# Patient Record
Sex: Male | Born: 1945 | Race: White | Hispanic: No | Marital: Single | Smoking: Former smoker
Health system: Southern US, Community
[De-identification: ages and names within clinical notes are randomized; demographics above are authoritative.]

## PROBLEM LIST (undated history)

## (undated) DIAGNOSIS — Z720 Tobacco use: Secondary | ICD-10-CM

## (undated) DIAGNOSIS — I4891 Unspecified atrial fibrillation: Secondary | ICD-10-CM

## (undated) DIAGNOSIS — R4182 Altered mental status, unspecified: Secondary | ICD-10-CM

## (undated) DIAGNOSIS — R7401 Elevation of levels of liver transaminase levels: Secondary | ICD-10-CM

## (undated) DIAGNOSIS — R269 Unspecified abnormalities of gait and mobility: Secondary | ICD-10-CM

## (undated) DIAGNOSIS — I5022 Chronic systolic (congestive) heart failure: Secondary | ICD-10-CM

## (undated) DIAGNOSIS — I639 Cerebral infarction, unspecified: Secondary | ICD-10-CM

## (undated) DIAGNOSIS — N179 Acute kidney failure, unspecified: Secondary | ICD-10-CM

## (undated) DIAGNOSIS — F1010 Alcohol abuse, uncomplicated: Secondary | ICD-10-CM

## (undated) DIAGNOSIS — I10 Essential (primary) hypertension: Secondary | ICD-10-CM

## (undated) DIAGNOSIS — E785 Hyperlipidemia, unspecified: Secondary | ICD-10-CM

## (undated) DIAGNOSIS — M6282 Rhabdomyolysis: Secondary | ICD-10-CM

## (undated) DIAGNOSIS — I6789 Other cerebrovascular disease: Secondary | ICD-10-CM

## (undated) DIAGNOSIS — F17200 Nicotine dependence, unspecified, uncomplicated: Secondary | ICD-10-CM

## (undated) DIAGNOSIS — R740 Nonspecific elevation of levels of transaminase and lactic acid dehydrogenase [LDH]: Secondary | ICD-10-CM

## (undated) DIAGNOSIS — R7402 Elevation of levels of lactic acid dehydrogenase (LDH): Secondary | ICD-10-CM

## (undated) HISTORY — DX: Unspecified abnormalities of gait and mobility: R26.9

## (undated) HISTORY — DX: Hyperlipidemia, unspecified: E78.5

## (undated) HISTORY — DX: Other cerebrovascular disease: I67.89

## (undated) HISTORY — DX: Alcohol abuse, uncomplicated: F10.10

## (undated) HISTORY — DX: Altered mental status, unspecified: R41.82

## (undated) HISTORY — DX: Unspecified atrial fibrillation: I48.91

## (undated) HISTORY — PX: BACK SURGERY: SHX140

## (undated) HISTORY — DX: Acute kidney failure, unspecified: N17.9

## (undated) HISTORY — DX: Elevation of levels of lactic acid dehydrogenase (LDH): R74.02

## (undated) HISTORY — PX: EYE MUSCLE SURGERY: SHX370

## (undated) HISTORY — DX: Nicotine dependence, unspecified, uncomplicated: F17.200

## (undated) HISTORY — DX: Nonspecific elevation of levels of transaminase and lactic acid dehydrogenase (ldh): R74.0

## (undated) HISTORY — DX: Rhabdomyolysis: M62.82

## (undated) HISTORY — DX: Elevation of levels of liver transaminase levels: R74.01

---

## 2011-08-23 ENCOUNTER — Emergency Department (HOSPITAL_COMMUNITY): Payer: Medicare Other

## 2011-08-23 ENCOUNTER — Encounter (HOSPITAL_COMMUNITY): Payer: Self-pay

## 2011-08-23 ENCOUNTER — Inpatient Hospital Stay (HOSPITAL_COMMUNITY): Payer: Medicare Other

## 2011-08-23 ENCOUNTER — Inpatient Hospital Stay (HOSPITAL_COMMUNITY)
Admission: EM | Admit: 2011-08-23 | Discharge: 2011-09-01 | DRG: 064 | Disposition: A | Discharge status: Skilled Nursing Facility | Payer: Medicare Other | Attending: Internal Medicine | Admitting: Internal Medicine

## 2011-08-23 DIAGNOSIS — Z880 Allergy status to penicillin: Secondary | ICD-10-CM

## 2011-08-23 DIAGNOSIS — I6340 Cerebral infarction due to embolism of unspecified cerebral artery: Secondary | ICD-10-CM

## 2011-08-23 DIAGNOSIS — M6282 Rhabdomyolysis: Secondary | ICD-10-CM | POA: Diagnosis present

## 2011-08-23 DIAGNOSIS — I5023 Acute on chronic systolic (congestive) heart failure: Secondary | ICD-10-CM | POA: Diagnosis not present

## 2011-08-23 DIAGNOSIS — I4891 Unspecified atrial fibrillation: Secondary | ICD-10-CM | POA: Diagnosis present

## 2011-08-23 DIAGNOSIS — I5021 Acute systolic (congestive) heart failure: Secondary | ICD-10-CM | POA: Diagnosis present

## 2011-08-23 DIAGNOSIS — I639 Cerebral infarction, unspecified: Secondary | ICD-10-CM | POA: Diagnosis present

## 2011-08-23 DIAGNOSIS — Z91199 Patient's noncompliance with other medical treatment and regimen due to unspecified reason: Secondary | ICD-10-CM

## 2011-08-23 DIAGNOSIS — F1020 Alcohol dependence, uncomplicated: Secondary | ICD-10-CM

## 2011-08-23 DIAGNOSIS — Z720 Tobacco use: Secondary | ICD-10-CM | POA: Diagnosis present

## 2011-08-23 DIAGNOSIS — Z888 Allergy status to other drugs, medicaments and biological substances status: Secondary | ICD-10-CM

## 2011-08-23 DIAGNOSIS — I10 Essential (primary) hypertension: Secondary | ICD-10-CM | POA: Diagnosis present

## 2011-08-23 DIAGNOSIS — G8190 Hemiplegia, unspecified affecting unspecified side: Secondary | ICD-10-CM

## 2011-08-23 DIAGNOSIS — R4182 Altered mental status, unspecified: Secondary | ICD-10-CM

## 2011-08-23 DIAGNOSIS — R945 Abnormal results of liver function studies: Secondary | ICD-10-CM | POA: Diagnosis present

## 2011-08-23 DIAGNOSIS — R29898 Other symptoms and signs involving the musculoskeletal system: Secondary | ICD-10-CM | POA: Diagnosis present

## 2011-08-23 DIAGNOSIS — F1010 Alcohol abuse, uncomplicated: Secondary | ICD-10-CM | POA: Diagnosis present

## 2011-08-23 DIAGNOSIS — Z79899 Other long term (current) drug therapy: Secondary | ICD-10-CM

## 2011-08-23 DIAGNOSIS — I509 Heart failure, unspecified: Secondary | ICD-10-CM | POA: Diagnosis present

## 2011-08-23 DIAGNOSIS — I959 Hypotension, unspecified: Secondary | ICD-10-CM | POA: Diagnosis present

## 2011-08-23 DIAGNOSIS — I426 Alcoholic cardiomyopathy: Secondary | ICD-10-CM | POA: Diagnosis present

## 2011-08-23 DIAGNOSIS — Z9119 Patient's noncompliance with other medical treatment and regimen: Secondary | ICD-10-CM

## 2011-08-23 DIAGNOSIS — F17200 Nicotine dependence, unspecified, uncomplicated: Secondary | ICD-10-CM | POA: Diagnosis present

## 2011-08-23 DIAGNOSIS — I6350 Cerebral infarction due to unspecified occlusion or stenosis of unspecified cerebral artery: Principal | ICD-10-CM | POA: Diagnosis present

## 2011-08-23 DIAGNOSIS — N179 Acute kidney failure, unspecified: Secondary | ICD-10-CM | POA: Diagnosis present

## 2011-08-23 DIAGNOSIS — R7989 Other specified abnormal findings of blood chemistry: Secondary | ICD-10-CM | POA: Diagnosis present

## 2011-08-23 HISTORY — DX: Alcohol abuse, uncomplicated: F10.10

## 2011-08-23 HISTORY — DX: Essential (primary) hypertension: I10

## 2011-08-23 HISTORY — DX: Tobacco use: Z72.0

## 2011-08-23 LAB — RAPID URINE DRUG SCREEN, HOSP PERFORMED
Amphetamines: NOT DETECTED
Barbiturates: NOT DETECTED
Benzodiazepines: NOT DETECTED
Tetrahydrocannabinol: NOT DETECTED

## 2011-08-23 LAB — DIFFERENTIAL
Eosinophils Absolute: 0.1 10*3/uL (ref 0.0–0.7)
Lymphocytes Relative: 20 % (ref 12–46)
Lymphs Abs: 2 10*3/uL (ref 0.7–4.0)
Monocytes Relative: 9 % (ref 3–12)
Neutrophils Relative %: 70 % (ref 43–77)

## 2011-08-23 LAB — CBC
Hemoglobin: 15.5 g/dL (ref 13.0–17.0)
MCH: 36 pg — ABNORMAL HIGH (ref 26.0–34.0)
MCV: 103.5 fL — ABNORMAL HIGH (ref 78.0–100.0)
RBC: 4.31 MIL/uL (ref 4.22–5.81)
WBC: 10 10*3/uL (ref 4.0–10.5)

## 2011-08-23 LAB — URINALYSIS, ROUTINE W REFLEX MICROSCOPIC
Ketones, ur: 40 mg/dL — AB
Nitrite: NEGATIVE
Protein, ur: 30 mg/dL — AB
Urobilinogen, UA: 1 mg/dL (ref 0.0–1.0)

## 2011-08-23 LAB — POCT I-STAT, CHEM 8
Hemoglobin: 16.3 g/dL (ref 13.0–17.0)
Sodium: 140 mEq/L (ref 135–145)
TCO2: 21 mmol/L (ref 0–100)

## 2011-08-23 LAB — CREATININE, URINE, RANDOM: Creatinine, Urine: 254.34 mg/dL

## 2011-08-23 LAB — COMPREHENSIVE METABOLIC PANEL
ALT: 48 U/L (ref 0–53)
Alkaline Phosphatase: 92 U/L (ref 39–117)
BUN: 39 mg/dL — ABNORMAL HIGH (ref 6–23)
CO2: 23 mEq/L (ref 19–32)
GFR calc Af Amer: 52 mL/min — ABNORMAL LOW (ref >90)
GFR calc non Af Amer: 45 mL/min — ABNORMAL LOW (ref >90)
Glucose, Bld: 91 mg/dL (ref 70–99)
Potassium: 4.5 mEq/L (ref 3.5–5.1)
Total Bilirubin: 2 mg/dL — ABNORMAL HIGH (ref 0.3–1.2)
Total Protein: 7.5 g/dL (ref 6.0–8.3)

## 2011-08-23 LAB — CARDIAC PANEL(CRET KIN+CKTOT+MB+TROPI)
Relative Index: 0.3 (ref 0.0–2.5)
Total CK: 2703 U/L — ABNORMAL HIGH (ref 7–232)

## 2011-08-23 LAB — URINE MICROSCOPIC-ADD ON

## 2011-08-23 LAB — ETHANOL: Alcohol, Ethyl (B): <11 mg/dL (ref 0–11)

## 2011-08-23 LAB — TROPONIN I: Troponin I: <0.3 ng/mL (ref <0.30)

## 2011-08-23 LAB — TSH: TSH: 1.869 u[IU]/mL (ref 0.350–4.500)

## 2011-08-23 LAB — CK TOTAL AND CKMB (NOT AT ARMC): Total CK: 3920 U/L — ABNORMAL HIGH (ref 7–232)

## 2011-08-23 LAB — SODIUM, URINE, RANDOM: Sodium, Ur: 45 mEq/L

## 2011-08-23 MED ORDER — FOLIC ACID 1 MG PO TABS
1.0000 mg | ORAL_TABLET | Freq: Every day | ORAL | Status: DC
  Administered 2011-08-23 – 2011-09-01 (×10): 1 mg via ORAL
  Filled 2011-08-23 (×11): qty 1

## 2011-08-23 MED ORDER — LORAZEPAM 2 MG/ML IJ SOLN: 0.0000 mg | Freq: Two times a day (BID) | INTRAMUSCULAR | Status: AC

## 2011-08-23 MED ORDER — ACETAMINOPHEN 650 MG RE SUPP: 650.0000 mg | RECTAL | Status: DC | PRN

## 2011-08-23 MED ORDER — ADULT MULTIVITAMIN W/MINERALS CH
1.0000 | ORAL_TABLET | Freq: Every day | ORAL | Status: DC
  Administered 2011-08-23 – 2011-09-01 (×10): 1 via ORAL
  Filled 2011-08-23 (×11): qty 1

## 2011-08-23 MED ORDER — THIAMINE HCL 100 MG/ML IJ SOLN
100.0000 mg | Freq: Every day | INTRAMUSCULAR | Status: DC
  Filled 2011-08-23 (×2): qty 1

## 2011-08-23 MED ORDER — ASPIRIN 325 MG PO TABS
325.0000 mg | ORAL_TABLET | Freq: Every day | ORAL | Status: DC
  Administered 2011-08-23 – 2011-08-25 (×3): 325 mg via ORAL
  Filled 2011-08-23 (×4): qty 1

## 2011-08-23 MED ORDER — VITAMIN B-1 100 MG PO TABS
100.0000 mg | ORAL_TABLET | Freq: Every day | ORAL | Status: DC
  Administered 2011-08-23 – 2011-09-01 (×10): 100 mg via ORAL
  Filled 2011-08-23 (×10): qty 1

## 2011-08-23 MED ORDER — LORAZEPAM 2 MG/ML IJ SOLN: 0.0000 mg | Freq: Four times a day (QID) | INTRAMUSCULAR | Status: AC

## 2011-08-23 MED ORDER — ATORVASTATIN CALCIUM 40 MG PO TABS
40.0000 mg | ORAL_TABLET | Freq: Every day | ORAL | Status: DC
  Administered 2011-08-23: 40 mg via ORAL
  Filled 2011-08-23 (×2): qty 1

## 2011-08-23 MED ORDER — SODIUM CHLORIDE 0.9 % IV SOLN
Freq: Once | INTRAVENOUS | Status: AC
  Administered 2011-08-23: 1000 mL/h via INTRAVENOUS

## 2011-08-23 MED ORDER — SODIUM CHLORIDE 0.9 % IV SOLN
INTRAVENOUS | Status: DC
  Administered 2011-08-23: 16:00:00 via INTRAVENOUS

## 2011-08-23 MED ORDER — ACETAMINOPHEN 325 MG PO TABS
650.0000 mg | ORAL_TABLET | ORAL | Status: DC | PRN
  Administered 2011-08-27: 650 mg via ORAL
  Filled 2011-08-23: qty 2
  Filled 2011-08-23: qty 1

## 2011-08-23 MED ORDER — ASPIRIN 300 MG RE SUPP
300.0000 mg | Freq: Every day | RECTAL | Status: DC
  Filled 2011-08-23 (×3): qty 1

## 2011-08-23 NOTE — ED Provider Notes (Signed)
History     CSN: [id number]  Arrival date & time 08/23/11  1133   First MD Initiated Contact with Patient 08/23/11 1141      Chief Complaint  Patient presents with  . Cerebrovascular Accident    (Consider location/radiation/quality/duration/timing/severity/associated sxs/prior treatment) Patient is a 66 y.o. male presenting with Acute Neurological Problem. The history is provided by the patient and a relative. The history is limited by the condition of the patient (The patient is a poor and unreliable historian.).  Cerebrovascular Accident This is a new problem. The current episode started in the past 7 days. The problem occurs constantly. Associated symptoms comments: Per the patient, he fell asleep on the couch 3 days ago and when he woke he was leaning to the right and had weakness in left arm. He states he does not remember anything after that. Per the friend/girl friend at bedside, the patient was at home alone this week as she was out of town. He was at baseline on Monday when she left and talked only via text message as the patient would not answer the phone but was responding by text. She returned last night to find him on the floor surrounded by urine and feces. He has been confused, but she states he is an alcoholic and she thought he had been drinking so didn't notice he was confused until she returned home. He has a history of hypertension but has not taken medication in a long time. Marland Kitchen    History reviewed. No pertinent past medical history.  History reviewed. No pertinent past surgical history.  History reviewed. No pertinent family history.  History  Substance Use Topics  . Smoking status: Current Everyday Smoker  . Smokeless tobacco: Not on file  . Alcohol Use: Yes      Review of Systems  Unable to perform ROS   Allergies  Codeine and Penicillins  Home Medications  No current outpatient prescriptions on file.  BP 149/105  Pulse 86  Temp(Src) 98.2 F (36.8  C) (Oral)  Resp 20  SpO2 99%  Physical Exam  Constitutional: He appears well-developed and well-nourished.  HENT:  Head: Normocephalic and atraumatic.  Eyes: Pupils are equal, round, and reactive to light.  Neck: Normal range of motion.  Cardiovascular: Normal rate and regular rhythm.   No murmur heard. Pulmonary/Chest: Effort normal and breath sounds normal. He has no wheezes.  Abdominal: Soft. There is no tenderness. There is no rebound and no guarding.  Musculoskeletal: Normal range of motion.  Neurological: He is alert. He displays normal reflexes. He exhibits normal muscle tone.       The patient has memory loss as to events this week. He is disoriented to place and time. He follows commands and responds verbally. No strength deficits in extremities. No cranial nerve deficits apparent, nerves 3-12. Reflexes are symmetric.     ED Course  Procedures (including critical care time)  Labs Reviewed  CBC - Abnormal; Notable for the following:    MCV 103.5 (*)    MCH 36.0 (*)    Platelets 127 (*)    All other components within normal limits  POCT I-STAT, CHEM 8 - Abnormal; Notable for the following:    BUN 38 (*)    Creatinine, Ser 1.50 (*)    Calcium, Ion 1.07 (*)    All other components within normal limits  DIFFERENTIAL  PROTIME-INR  APTT  COMPREHENSIVE METABOLIC PANEL  CK TOTAL AND CKMB  TROPONIN I  URINE RAPID DRUG  SCREEN (HOSP PERFORMED)  ETHANOL  URINALYSIS, ROUTINE W REFLEX MICROSCOPIC  AMMONIA   Results for orders placed during the hospital encounter of 08/23/11  PROTIME-INR      Component Value Range   Prothrombin Time 13.7  11.6 - 15.2 (seconds)   INR 1.03  0.00 - 1.49   APTT      Component Value Range   aPTT 32  24 - 37 (seconds)  CBC      Component Value Range   WBC 10.0  4.0 - 10.5 (K/uL)   RBC 4.31  4.22 - 5.81 (MIL/uL)   Hemoglobin 15.5  13.0 - 17.0 (g/dL)   HCT 46.9  62.9 - 52.8 (%)   MCV 103.5 (*) 78.0 - 100.0 (fL)   MCH 36.0 (*) 26.0 - 34.0  (pg)   MCHC 34.8  30.0 - 36.0 (g/dL)   RDW 41.3  24.4 - 01.0 (%)   Platelets 127 (*) 150 - 400 (K/uL)  DIFFERENTIAL      Component Value Range   Neutrophils Relative 70  43 - 77 (%)   Neutro Abs 7.0  1.7 - 7.7 (K/uL)   Lymphocytes Relative 20  12 - 46 (%)   Lymphs Abs 2.0  0.7 - 4.0 (K/uL)   Monocytes Relative 9  3 - 12 (%)   Monocytes Absolute 0.9  0.1 - 1.0 (K/uL)   Eosinophils Relative 1  0 - 5 (%)   Eosinophils Absolute 0.1  0.0 - 0.7 (K/uL)   Basophils Relative 1  0 - 1 (%)   Basophils Absolute 0.1  0.0 - 0.1 (K/uL)  COMPREHENSIVE METABOLIC PANEL      Component Value Range   Sodium 140  135 - 145 (mEq/L)   Potassium 4.5  3.5 - 5.1 (mEq/L)   Chloride 103  96 - 112 (mEq/L)   CO2 23  19 - 32 (mEq/L)   Glucose, Bld 91  70 - 99 (mg/dL)   BUN 39 (*) 6 - 23 (mg/dL)   Creatinine, Ser 2.72 (*) 0.50 - 1.35 (mg/dL)   Calcium 9.6  8.4 - 53.6 (mg/dL)   Total Protein 7.5  6.0 - 8.3 (g/dL)   Albumin 3.8  3.5 - 5.2 (g/dL)   AST 644 (*) 0 - 37 (U/L)   ALT 48  0 - 53 (U/L)   Alkaline Phosphatase 92  39 - 117 (U/L)   Total Bilirubin 2.0 (*) 0.3 - 1.2 (mg/dL)   GFR calc non Af Amer 45 (*) >90 (mL/min)   GFR calc Af Amer 52 (*) >90 (mL/min)  CK TOTAL AND CKMB      Component Value Range   Total CK 3920 (*) 7 - 232 (U/L)   CK, MB 13.1 (*) 0.3 - 4.0 (ng/mL)   Relative Index 0.3  0.0 - 2.5   TROPONIN I      Component Value Range   Troponin I <0.30  <0.30 (ng/mL)  ETHANOL      Component Value Range   Alcohol, Ethyl (B) <11  0 - 11 (mg/dL)  AMMONIA      Component Value Range   Ammonia 16  11 - 60 (umol/L)  POCT I-STAT, CHEM 8      Component Value Range   Sodium 140  135 - 145 (mEq/L)   Potassium 4.6  3.5 - 5.1 (mEq/L)   Chloride 111  96 - 112 (mEq/L)   BUN 38 (*) 6 - 23 (mg/dL)   Creatinine, Ser 0.34 (*) 0.50 - 1.35 (mg/dL)  Glucose, Bld 91  70 - 99 (mg/dL)   Calcium, Ion 7.82 (*) 1.12 - 1.32 (mmol/L)   TCO2 21  0 - 100 (mmol/L)   Hemoglobin 16.3  13.0 - 17.0 (g/dL)   HCT 95.6   21.3 - 08.6 (%)    Dg Chest Portable 1 View  08/23/2011  *RADIOLOGY REPORT*  Clinical Data: Altered mental status  PORTABLE CHEST - 1 VIEW  Comparison: None.  Findings: Cardiomediastinal silhouette is unremarkable.  No acute infiltrate or pleural effusion.  No pulmonary edema.  Mild degenerative changes thoracic spine.  IMPRESSION: No active disease.  Mild degenerative changes thoracic spine.  Original Report Authenticated By: Natasha Mead, M.D.     No diagnosis found. 1. Subacute stroke 2. Mild rhabdomyolysis 3. Alcoholism 4. Hypertension 5. Medication noncompliance 6. Altered mental status     MDM  Patient is slightly confused but stable mentally without change during ED stay. No complaint of pain. IV fluids bolusing. Subacute infarct left frontal region on CT, no active bleed. CK elevated supporting diagnosis of mild rhabdomyolysis/dehydration. Teaching Service to admit. Patient agrees to admission.         Rodena Medin, PA-C 08/23/11 1339

## 2011-08-23 NOTE — ED Notes (Signed)
Patient transported to X-ray 
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
## 2011-08-23 NOTE — Progress Notes (Signed)
08/23/11 1427  Discharge Planning  Type of Residence Private residence  Living Arrangements Spouse/significant other  Home Care Services No  Support Systems Spouse/significant other  Do you have any problems obtaining your medications? Yes (Describe)  Family/patient expects to be discharged to: Unsure  Once you are discharged, how will you get to your follow-up appointment? Friend  Expected Discharge Date 08/27/11  Case Management Consult Needed Yes (Comment)  Social Work Consult Needed Yes (Comment)      Per chart review pt is unfunded. Unit based CM/LCSW to be consulted for disposition needs and financial needs.   Dionne Milo MSW [facility] Emergency Dept. Weekend/Social Worker [phone]

## 2011-08-23 NOTE — ED Notes (Signed)
Patient transported to CT and xray 

## 2011-08-23 NOTE — ED Notes (Signed)
Pt last seen normal by friend on Monday, pt reports feeing numbness on right side for 4 days, found on the floor last night incontinent, at the time patient reported being on the floor since Tuesday

## 2011-08-23 NOTE — H&P (Signed)
[facility] Admission Note Date: 08/23/2011  Patient name: Brian Mclaughlin Medical record number: [id number] Date of birth: [birth year] Age: 66 y.o. Gender: male PCP: No primary provider on file.  Medical Service: Internal Medicine Teaching Service  Attending physician: Dr. Daiva Eves     Chief Complaint: AMS, right leg numbness/weakness  History of Present Illness: Patient is a 66 yo man with PMH of hypertension who has not seen a physician in many years. He presents to the ED with a history of right leg weakness/numbness, AMS, and bladder & bowel incontinence since Wednesday (4 days ago). The patient denies that anything like this has ever happened before. He was last seen normal by his girlfriend on Monday before she left to go out of town. She notes texting him on Tuesday and received a normal response back. Beginning sometime on Wednesday she says his texts became short or blank and did not make any sense. The patient reports watching tv all day on Wednesday and in the evening he says he fell asleep on the couch and denies "passing out." He is a bit of a bad historian for the events between Wednesday and when he was found on Friday, however he can say that he noticed his right thigh became numb and weak on Wednesday. He woke up on the floor next to the couch Wednesday night and had become incontinent of bowels and bladder at that time. He was using sheets around him to wipe himself of the incontinence. According to his girlfriend it seems as if he was contained within the living room given the radius of his incontinence as he apparently was crawling around the room. It is uncertain to both the patient and g/f where the "broom" of the house is stored, but somehow the patient was able to obtain this broom and was found Friday evening (by his girlfriend upon her arrival back home) lying flat on his back next to the couch with the broom in both hands perpendicular to his body. As he is a usual drinker  and his g/f is used to him being "drunk" she did not think much of Friday night and she helped him upstairs to bed and got him cleaned up. She said his mental status was typical of him for an evening of drinking and did not comment on his incontinence. When she awoke on Saturday morning, the day of admission, she again found signs upstairs that he had become incontinent of bowel and bladder. She found the patient downstairs on the floor next to the couch with his right arm draped on the couch as if he had attempted to get up off the floor but could not succeed. The patient was noted to be confused at this time and he was brought to the ED via ambulance. The patient denied any dizziness, N/V/D, headaches, vision changes, or hearing changes over the past week    Meds: No prescriptions prior to admission    Allergies: Allergies as of 08/23/2011 - Review Complete 08/23/2011  Allergen Reaction Noted  . Codeine Rash 08/23/2011  . Penicillins Rash 08/23/2011   Past Medical History  Diagnosis Date  . Hypertension    Past Surgical History  Procedure Date  . Eye muscle surgery     as a teenager "tighten his muscles"  . Back surgery 10 years ago    "slipped disc" repair   Family History  Problem Relation Age of Onset  . Cancer Mother   . Cancer Father   .  Multiple sclerosis Daughter    History   Social History  . Marital Status: Unknown    Spouse Name: N/A    Number of Children: 1  . Years of Education: Associate's degree from community college   Occupational History  . Unemployed Product manager    Retired   Social History Main Topics  . Smoking status: Current Everyday Smoker -- 0.5 packs/day for 45 years    Types: Cigarettes  . Smokeless tobacco: Not on file  . Alcohol Use: 6-8 beers / day x 45 years     Drinks 6-8 beers daily  . Drug Use: No     denies any drug usage  . Sexually Active: Yes -- Male partner(s)   Other Topics Concern  . Not on file   Social History  Narrative   Patient graduated HS and completed a 2 year associate's degree at [employer], unknown degree type    Review of Systems: Constitutional: positive for none, negative for chills, fatigue, fevers, malaise, night sweats and weight loss Eyes: positive for none, negative for cataracts, glaucoma, icterus and redness Ears, nose, mouth, throat, and face: positive for none, negative for earaches, facial trauma, hoarseness and sore mouth Respiratory: positive for none, negative for asthma, cough, emphysema, hemoptysis and wheezing Cardiovascular: positive for irregular heart beat, negative for chest pain, chest pressure/discomfort, dyspnea, lower extremity edema, orthopnea and paroxysmal nocturnal dyspnea Gastrointestinal: positive for change in bowel habits, negative for abdominal pain and constipation Genitourinary:positive for decreased stream, dysuria, frequency, hesitancy, nocturia and urinary incontinence, negative for hematuria Integument/breast: negative Hematologic/lymphatic: positive for none, negative for bleeding and lymphadenopathy Musculoskeletal:positive for bone pain, negative for arthralgias, back pain and myalgias Neurological: positive for coordination problems, memory problems and weakness, negative for dizziness, paresthesia and speech problems Behavioral/Psych: positive for excessive alcohol consumption and abnormal mental status, negative for fatigue, irritability and loss of interest in favorite activities Endocrine: positive for none, negative for diabetic symptoms including all and temperature intolerance Allergic/Immunologic: positive for none, negative for anaphylaxis, hay fever and urticaria  Physical Exam: Blood pressure 169/93, pulse 77, temperature 98.2 F (36.8 C), temperature source Oral, resp. rate 18, SpO2 100.00%. General appearance: cooperative, no distress and confused, laughs inappropriately, amnesia of events Head: Normocephalic,  without obvious abnormality, atraumatic Eyes: conjunctivae/corneas clear. PERRL, EOM's intact. Fundi benign. Nose: Nares normal. Septum midline. Mucosa normal. No drainage or sinus tenderness. Neck: no adenopathy, no carotid bruit, no JVD, supple, symmetrical, trachea midline and thyroid not enlarged, symmetric, no tenderness/mass/nodules Back: symmetric, no curvature. ROM normal. No CVA tenderness., large bruise right lower back (5 inches x 4 inches) Lungs: clear to auscultation bilaterally Chest wall: right sided chest wall tenderness Heart: irregularly irregular rhythm Abdomen: soft, non-tender; bowel sounds normal; no masses,  no organomegaly Extremities: extremities normal, atraumatic, no cyanosis or edema Pulses: DP decreased B/L Skin: ecchymoses - back, right lower Neurologic: Mental status: Alert, oriented, thought content appropriate, alertness: reduced, orientation: person, affect: inappropriate Cranial nerves:  I: smell Not tested  II: visual acuity  OS:     OD:   II: visual fields Fails confrontation  II: pupils Equal, round, reactive to light  III,VII: ptosis None  III,IV,VI: extraocular muscles  Full ROM  V: mastication Normal  V: facial light touch sensation  Normal  V,VII: corneal reflex  Present  VII: facial muscle function - upper  Normal  VII: facial muscle function - lower Normal  VIII: hearing Not tested  IX: soft palate elevation  Deviates RIGHT  IX,X: gag reflex Present  XI: trapezius strength  5/5  XI: sternocleidomastoid strength 5/5  XI: neck flexion strength  5/5  XII: tongue strength  Normal   Sensory: normal Motor: right leg/thigh weakness. Left foot weakness though chronic issue (since [place]) Reflexes: 2+ and symmetric  Lab results: Basic Metabolic Panel:  Basename 08/23/11 1222 08/23/11 1202  NA 140 140  K 4.6 4.5  CL 111 103  CO2 -- 23  GLUCOSE 91 91  BUN 38* 39*  CREATININE 1.50* 1.56*  CALCIUM -- 9.6  MG -- --  PHOS -- --   Liver  Function Tests:  [facility] 08/23/11 1202  AST 152*  ALT 48  ALKPHOS 92  BILITOT 2.0*  PROT 7.5  ALBUMIN 3.8   No results found for this basename: LIPASE:2,AMYLASE:2 in the last 72 hours  Basename 08/23/11 1203  AMMONIA 16   CBC:  Basename 08/23/11 1222 08/23/11 1202  WBC -- 10.0  NEUTROABS -- 7.0  HGB 16.3 15.5  HCT 48.0 44.6  MCV -- 103.5*  PLT -- 127*   Cardiac Enzymes:  Basename 08/23/11 1210  CKTOTAL 3920*  CKMB 13.1*  CKMBINDEX --  TROPONINI <0.30   BNP: No results found for this basename: PROBNP:3 in the last 72 hours D-Dimer: No results found for this basename: DDIMER:2 in the last 72 hours CBG: No results found for this basename: GLUCAP:6 in the last 72 hours Hemoglobin A1C: No results found for this basename: HGBA1C in the last 72 hours Fasting Lipid Panel: No results found for this basename: CHOL,HDL,LDLCALC,TRIG,CHOLHDL,LDLDIRECT in the last 72 hours Thyroid Function Tests: No results found for this basename: TSH,T4TOTAL,FREET4,T3FREE,THYROIDAB in the last 72 hours Anemia Panel: No results found for this basename: VITAMINB12,FOLATE,FERRITIN,TIBC,IRON,RETICCTPCT in the last 72 hours Coagulation:  Basename 08/23/11 1202  LABPROT 13.7  INR 1.03   Urine Drug Screen: Drugs of Abuse  No results found for this basename: labopia,  cocainscrnur,  labbenz,  amphetmu,  thcu,  labbarb    Alcohol Level:  Basename 08/23/11 1202  ETH <11   Urinalysis: No results found for this basename: COLORURINE:2,APPERANCEUR:2,LABSPEC:2,PHURINE:2,GLUCOSEU:2,HGBUR:2,BILIRUBINUR:2,KETONESUR:2,PROTEINUR:2,UROBILINOGEN:2,NITRITE:2,LEUKOCYTESUR:2 in the last 72 hours  Imaging results:  Ct Head Wo Contrast  08/23/2011  *RADIOLOGY REPORT*  Clinical Data: Numbness on the right side for 4 days.  The patient is confused.  CT HEAD WITHOUT CONTRAST  Technique:  Contiguous axial images were obtained from the base of the skull through the vertex without contrast.  Comparison: None.   Findings: There is diffuse low density in the periventricular and subcortical white matter suggesting chronic changes.  There is a focal low density in the medial left frontal lobe which could represent edema and an infarct.  Age of this infarct is indeterminate but it could be subacute.  Small lacunar infarcts along the right white matter tracts.  The patient has mild cerebral atrophy.  No evidence for acute hemorrhage, mass lesion, midline shift or hydrocephalus.  Motion artifact near the skull base.  No acute bony abnormality.  IMPRESSION: Low density in the medial left frontal lobe is suggestive for an infarct.  Age of the infarct is uncertain but could be subacute.  No evidence for acute hemorrhage.  Atrophy and evidence of chronic small vessel ischemic changes.  Original Report Authenticated By: Richarda Overlie, M.D.   Dg Chest Portable 1 View  08/23/2011  *RADIOLOGY REPORT*  Clinical Data: Altered mental status  PORTABLE CHEST - 1 VIEW  Comparison: None.  Findings: Cardiomediastinal silhouette is unremarkable.  No acute infiltrate or  pleural effusion.  No pulmonary edema.  Mild degenerative changes thoracic spine.  IMPRESSION: No active disease.  Mild degenerative changes thoracic spine.  Original Report Authenticated By: Natasha Mead, M.D.    Other results: EKG: there are no previous tracings available for comparison, atrial fibrillation, rate 78.  Assessment & Plan by Problem:  66 yo man with PMH of HTN, no medical care in many years. Afib noted on exam. Presents with 4 day h/o AMS, incontinent of bowel/bladder, and right thigh weakness/numbness.   Stroke - Patient has numerous risk factors for stroke (HTN, excessive etoh, afib). Last seen normal on Monday. Became confused over past 4 days and incontinent of bowel and bladder function. His affect also appears abnormal. Has never been told if he has had afib and does not see a PCP normally. CT head shows density in the medial left frontal lobe suggestive  of an infarct. Could be subacute (defined as >24 hours to 6 weeks).  Differential includes ischemic embolic event 2/2 afib (CHADS2 score =1) vs. atheroemboli vs. hypercoagulable d/o vs. paradoxical embolism. - will obtain neurology consult, appreciate their assistance in the management of this patient - MRI/MRA head pending - Labs based on risk stratification: Lipid panel, A1C, UDS - carotid dopplers, EKG, CE's, 2D echo - will begin aspirin 325 mg daily - will begin lipitor 40 mg daily - PT/OT, speech eval  Atrial fib - Unknown if new or old. Called [facility] on [address] and they do have patient in system but no information on patient as they converted to EMR 3-4 years ago and no data in computer. - MRI/MRA head  - if stroke determined to be ischemic vs. hemorrhagic, will most likely start patient on anticoagulation  Hypertension - Patient's pressures on admission 142-169 (systolic) over 93-114 (diastolic). He has a h/o of untreated HTN. - will allow increased pressure x 24 hours in setting of stroke for adequate CPP - if SBP >220 and DBP > 110 may give some PRN hydralazine  Rhabdomyolysis - His CK,tot = 3920 and CK,MB = 13.1 on admission. AG = 14. Given that he was trapped on the floor for multiple days he may have rhabdo 2/2 confinement in a fixed position in setting of stroke, or due to alcohol intoxication. Other causes can be arterial thrombus 2/2 afib, or extreme changes in body temp--hypo/hyperthermia (though much less likely). - NS @ 150 - recheck CMP daily  Acute renal failure - BUN = 39. SCr = 1.56. Ratio =25. Differential includes rhabdomyolysis induced (increased myoglobin toxic to kidney) vs. BPH (patient complains of urgency, hesitancy, frequency, dribbling) vs. crush injury (patient lying on floor on back/side for days and has large bruise on lower right back). - NS @ 150 - FeNa  Alcohol abuse - Patient reports 6-8 beers daily, though could be more as  he is a poor historian on admission. Will monitor him for s/s withdrawal. His ETOH level <11 on admit. His LFT's are abnormal with AST > (ALT x 2) - CIWA protocol - folic acid daily - thiamine daily - MV daily - consult SW  Tobacco abuse - Patient has a h/o 1/2 PPD x 45 years. In setting of stroke, HTN, and acute nature of symptoms will hold off on nicotine replacement for now. - smoking cessation consult  DVT ppx - SCD's  Disposition - Admit to IMTS, will consult neurology, will manage patient's pending and urgent medical needs.  This is a Psychologist, occupational Note.  The  care of the patient was discussed with Dr. Lorretta Harp and the assessment and plan was formulated with their assistance.  Please see their note for official documentation of the patient encounter.   SignedLewie Chamber 08/23/2011, 3:44 PM        Resident Addendum to Medical Student Admission H&P   I have seen and examined the patient, and agree with the the medical student assessment and plan as detailed in their separate H&P. Please see my brief note below for additional details.   CC: AMS and left leg weakness and numbness  HPI:  Patient is 66 yo man with PMH of HTN, alcohol and tobacco abuse, who presents with AMS and right leg numbness and weakness. History was provided by patient and his girl friend.   Per his girlfriend, patient was normal on Monday before she left to go out of town. Patient states that he was watching TV for almost a whole day on Wednesday, then he fell asleep (patient remembers that  he fell asleep rather than passed out). He did not recall how long he had slept . He woke up with the right leg weakness and numbness. He lost control of bladder and bowel movement during his sleeping. He denies any injury to his body. No nausea vomiting. His girlfriend communicated with patient on Wednesday by texting him and found his text message did not make sense. Since then patient was not able to communicate  normally with his girlfriend.  His girl friend came back on Friday and found patient was on the floor in a supine position next to the couch with a broom in his hands. According to his girlfriend it seems as if he was contained within the living room given the radius of his incontinence as he apparently was crawling around the room. As he is a usual drinker and his g/f is used to him being "drunk" she did not think much of Friday night and she helped him upstairs to bed and got him cleaned up. She said his mental status was typical of him for an evening of drinking and did not comment on his incontinence. When she awoke on Saturday morning, the day of admission, she again found signs upstairs that he had become incontinent of bowel and bladder. She found the patient downstairs on the floor next to the couch with his right arm draped on the couch as if he had attempted to get up off the floor but could not succeed. The patient was noted to be confused at this time and he was brought to the ED via ambulance.  The patient denied any dizziness, N/V/D, headaches, vision changes, or hearing changes.  Home Medications: No prescriptions prior to admission    Allergies: Allergies  Allergen Reactions  . Codeine Rash  . Penicillins Rash    Medical History: Reviewed   Surgical History: Reviewed   Social History: Reviewed   Family History: Reviewed    Review of Systems: as per HPI  Physical Examination:  Vital Signs: Blood pressure 133/90, pulse 89, temperature 97.9 F (36.6 C), temperature source Oral, resp. rate 18, SpO2 98.00%.  General: resting in bed, not in acute distress HEENT: PERRL, EOMI, no scleral icterus. No bruit and JVD. Cardiac: S1/S2, RRR, No murmurs, gallops or rubs Pulm: Good air movement bilaterally, Clear to auscultation bilaterally, No rales, wheezing, rhonchi or rubs. Abd: Soft,  nondistended, nontender, no rebound pain, no organomegaly, BS present. There is no CVA  tenderness bilaterally. Ext: No rashes  or edema, 2+DP/PT pulse bilaterally Skin: no rashes. No skin bruise. Neuro: alert and oriented X3, cranial nerves II-XII grossly intact, except for failure of conjugation of left eye and weaker palatal elevation on the left. Sensation to light touch intact. 1+ brachial reflex and very weak knee reflex bilaterally. Left ankle muscle strength is 2/5 on dorsal flexion (patient has chronic left ankle weakness for many years). Left leg muscle strength is 5/5.  Upper extremety muscle strength 5/5 bilaterally. Left leg muscle strength is 4/5. Normal finger-to-nose. Negative Babinski's sign.  Imaging results:  Ct Head Wo Contrast  08/23/2011  *RADIOLOGY REPORT*  Clinical Data: Numbness on the right side for 4 days.  The patient is confused.  CT HEAD WITHOUT CONTRAST  Technique:  Contiguous axial images were obtained from the base of the skull through the vertex without contrast.  Comparison: None.  Findings: There is diffuse low density in the periventricular and subcortical white matter suggesting chronic changes.  There is a focal low density in the medial left frontal lobe which could represent edema and an infarct.  Age of this infarct is indeterminate but it could be subacute.  Small lacunar infarcts along the right white matter tracts.  The patient has mild cerebral atrophy.  No evidence for acute hemorrhage, mass lesion, midline shift or hydrocephalus.  Motion artifact near the skull base.  No acute bony abnormality.  IMPRESSION: Low density in the medial left frontal lobe is suggestive for an infarct.  Age of the infarct is uncertain but could be subacute.  No evidence for acute hemorrhage.  Atrophy and evidence of chronic small vessel ischemic changes.  Original Report Authenticated By: Richarda Overlie, M.D.   Mr [name] Head Wo Contrast  08/23/2011  *RADIOLOGY REPORT*  Clinical Data:  Right-sided weakness.  Alcoholism.  MRI HEAD WITHOUT CONTRAST MRA HEAD WITHOUT CONTRAST   Technique:  Multiplanar, multiecho pulse sequences of the brain and surrounding structures were obtained without intravenous contrast. Angiographic images of the head were obtained using MRA technique without contrast.  Comparison:  CT head earlier today.  MRI HEAD  Findings:  Acute left frontal parasagittal, posterior frontal parasagittal, and left parietal parasagittal infarction without hemorrhage.  This corresponds to the CT abnormality.  Some of the more posterior areas of subcentimeter restricted diffusion are potentially in the left PCA territory, but the predominant area of involvement falls within the left ACA territory.  There is atrophy with chronic microvascular ischemic change. Scattered remote lacunes are noted in the basal ganglia and periventricular white matter.  Patent intracranial vasculature. Normal pituitary and cerebellar tonsils.  Mild chronic sinus disease.  There are no foci of chronic hemorrhage. Slight left mastoid fluid.  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  Atrophy and small vessel disease.  MRA HEAD  Findings: Left internal carotid artery widely patent and slightly larger than the right.  Suspected 50% stenosis cavernous right ICA. Mild nonstenotic irregularity right ICA junction with right middle cerebral artery.  A1 segment right anterior cerebral artery atretic.  Both anterior cerebral arteries fill from the left.  No MCA stenosis.  Basilar artery widely patent with vertebrals codominant.  No PCA narrowing.  The A2 and A3 segments of the right anterior cerebral artery appear widely patent.  There is focal estimated 50% narrowing of the A2 segment left anterior cerebral artery which could contribute to the patient's pattern of infarction.  This narrowing is not necessarily flow reducing.  No proximal occlusion  of the left ACA is observed. No cerebellar branch occlusion.  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  No ACA occlusion is observed, but proximal 50% stenosis  A2 segment left ACA could contribute to the observed pattern of infarction.  Original Report Authenticated By: Elsie Stain, M.D.   Mr Brain Wo Contrast  08/23/2011  *RADIOLOGY REPORT*  Clinical Data:  Right-sided weakness.  Alcoholism.  MRI HEAD WITHOUT CONTRAST MRA HEAD WITHOUT CONTRAST  Technique:  Multiplanar, multiecho pulse sequences of the brain and surrounding structures were obtained without intravenous contrast. Angiographic images of the head were obtained using MRA technique without contrast.  Comparison:  CT head earlier today.  MRI HEAD  Findings:  Acute left frontal parasagittal, posterior frontal parasagittal, and left parietal parasagittal infarction without hemorrhage.  This corresponds to the CT abnormality.  Some of the more posterior areas of subcentimeter restricted diffusion are potentially in the left PCA territory, but the predominant area of involvement falls within the left ACA territory.  There is atrophy with chronic microvascular ischemic change. Scattered remote lacunes are noted in the basal ganglia and periventricular white matter.  Patent intracranial vasculature. Normal pituitary and cerebellar tonsils.  Mild chronic sinus disease.  There are no foci of chronic hemorrhage. Slight left mastoid fluid.  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  Atrophy and small vessel disease.  MRA HEAD  Findings: Left internal carotid artery widely patent and slightly larger than the right.  Suspected 50% stenosis cavernous right ICA. Mild nonstenotic irregularity right ICA junction with right middle cerebral artery.  A1 segment right anterior cerebral artery atretic.  Both anterior cerebral arteries fill from the left.  No MCA stenosis.  Basilar artery widely patent with vertebrals codominant.   No PCA narrowing.  The A2 and A3 segments of the right anterior cerebral artery appear widely patent.  There is focal estimated 50% narrowing of the A2 segment left anterior cerebral artery which could contribute to the patient's pattern of infarction.  This narrowing is not necessarily flow reducing.  No proximal occlusion of the left ACA is observed. No cerebellar branch occlusion.  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  No ACA occlusion is observed, but proximal 50% stenosis  A2 segment left ACA could contribute to the observed pattern of infarction.  Original Report Authenticated By: Elsie Stain, M.D.   Dg Chest Portable 1 View  08/23/2011  *RADIOLOGY REPORT*  Clinical Data: Altered mental status  PORTABLE CHEST - 1 VIEW  Comparison: None.  Findings: Cardiomediastinal silhouette is unremarkable.  No acute infiltrate or pleural effusion.  No pulmonary edema.  Mild degenerative changes thoracic spine.  IMPRESSION: No active disease.  Mild degenerative changes thoracic spine.  Original Report Authenticated By: Natasha Mead, M.D.   Other results:  EKG: normal axis, flutter/A.fib, slightly early R wave progression, flattening and possible T wave inversion in inferior leads (III, aVF).    Assessment & Plan: I agree with Student's assessment and plan with additional points as follows:  # AMS:  Although patient claimed that he fell asleep on Wednesday and denies passing out, it is not completely clear about what happened in that day. Differential diagnosis includes seizure episode, syncope including vasovagal syncope, cardiac event, hypoglycemia, and neurologic event.   -will consider EEG to rule out seizure -cardiac monitor -seizure precaution  # Stroke - patient's symptoms is most likely caused by ischemic stroke given the findings on CT  scan and MRI. A. fib is an important etiology. Patient also has other risk factors including hypertension and alcohol abuse. Other  etiologies include hypercoagulable status and paradoxical embolism. Regarding patient's incontinence, it is most likely caused by stroke. But other possibility needs to be considered too, such as spinal cord lesions, Cauda Equina syndrome and seizure.  - will obtain neurology consult and follow up recommendations. - MRI/MRA head - Risk stratification: Lipid panel, A1C, UDS - carotid dopplers, EKG, CE's, 2D echo - will begin aspirin 325 mg daily - will begin lipitor 40 mg daily - PT/OT, speech eval  Atrial fib - etiology is not clear currently. The possible reasons include HTN, hyperthyroidism, COPD; ischemic heart disease, valvular disease and infection.  However it doesn't seem to be caused by cardiac ischemia given that the patient does not have any chest pain and EKG is negative for ischemia. Study showed that heparin is contraindicated in patient with A.Fib who presents with acute stroke. It is recommended to start Coumadin after 24-48 hours to decrease morbidity and mortality.  Generally, if the Atrial fibrillation happened within 48 hours, cardioversion can be performed. However, the exact onset time of his atrial Fibrilation is not clear. Patient does not have RVR currently.   - 2D-echo - repeat EKG - TSH - treat HTN after acute process is over.  Hypertension - Patient's pressures on admission 142-169 (systolic) over 93-114 (diastolic). He has a h/o of untreated HTN.  - will allow permissive HTN at this acute setting to avoid intracranial hypoperfusion. - treat only if SBP >220 and DBP > 120.  Rhabdomyolysis - His CK,tot = 3920 and CK,MB = 13.1 on admission. AG = 14. Given that he was trapped on the floor for multiple days he may have rhabdo 2/2 confinement in a fixed position in setting of stroke, or due to alcohol intoxication. Other causes can be arterial thrombus 2/2 afib, or extreme changes in body temp--hypo/hyperthermia (though much less likely).  - NS @ 150 - recheck CMP  daily - if not improve, add bicarbonate by IV   Acute renal failure - BUN = 39. SCr = 1.56. Ratio =25. Most likely caused by rhabod, other DD vs. BPH (patient complains of urgency, hesitancy, frequency, dribbling).  - NS @ 150 - FeNa -follow up BMP  Alcohol abuse - Patient reports 6-8 beers daily, though could be more as he is a poor historian on admission. Will monitor him for s/s withdrawal. His ETOH level <11 on admit. His LFT's are abnormal with AST > (ALT x 2) - CIWA protocol - folic acid daily - thiamine daily - MV daily - consult SW  Tobacco abuse - Patient has a h/o 1/2 PPD x 45 years. In setting of stroke, HTN, and acute nature of symptoms will hold off on nicotine replacement for now. - smoking cessation consult  DVT ppx - SCD's   Lorretta Harp, MD PGY1, Internal Medicine Teaching Service Pager: [phone]   08/23/2011, 8:17 PM

## 2011-08-23 NOTE — ED Provider Notes (Signed)
Medical screening examination/treatment/procedure(s) were conducted as a shared visit with non-physician practitioner(s) and myself.  I personally evaluated the patient during the encounter   Loren Racer, MD 08/23/11 [phone]

## 2011-08-23 NOTE — Progress Notes (Signed)
08/23/11 1431  OTHER  CSW Follow Up Status Follow-up required

## 2011-08-24 ENCOUNTER — Encounter (HOSPITAL_COMMUNITY): Payer: Self-pay | Admitting: Physician Assistant

## 2011-08-24 DIAGNOSIS — I6350 Cerebral infarction due to unspecified occlusion or stenosis of unspecified cerebral artery: Principal | ICD-10-CM

## 2011-08-24 DIAGNOSIS — I10 Essential (primary) hypertension: Secondary | ICD-10-CM

## 2011-08-24 DIAGNOSIS — R4182 Altered mental status, unspecified: Secondary | ICD-10-CM

## 2011-08-24 DIAGNOSIS — I6340 Cerebral infarction due to embolism of unspecified cerebral artery: Secondary | ICD-10-CM

## 2011-08-24 DIAGNOSIS — I517 Cardiomegaly: Secondary | ICD-10-CM

## 2011-08-24 DIAGNOSIS — G8190 Hemiplegia, unspecified affecting unspecified side: Secondary | ICD-10-CM

## 2011-08-24 DIAGNOSIS — I4891 Unspecified atrial fibrillation: Secondary | ICD-10-CM

## 2011-08-24 LAB — LIPID PANEL
HDL: 59 mg/dL (ref >39)
LDL Cholesterol: 75 mg/dL (ref 0–99)
Total CHOL/HDL Ratio: 2.5 RATIO
VLDL: 13 mg/dL (ref 0–40)

## 2011-08-24 LAB — BASIC METABOLIC PANEL
CO2: 24 mEq/L (ref 19–32)
Calcium: 8.7 mg/dL (ref 8.4–10.5)
Creatinine, Ser: 1.31 mg/dL (ref 0.50–1.35)
GFR calc non Af Amer: 55 mL/min — ABNORMAL LOW (ref >90)
Sodium: 140 mEq/L (ref 135–145)

## 2011-08-24 LAB — HIV ANTIBODY (ROUTINE TESTING W REFLEX): HIV: NONREACTIVE

## 2011-08-24 LAB — CARDIAC PANEL(CRET KIN+CKTOT+MB+TROPI)
CK, MB: 7.3 ng/mL (ref 0.3–4.0)
Relative Index: 0.4 (ref 0.0–2.5)
Total CK: 2039 U/L — ABNORMAL HIGH (ref 7–232)
Troponin I: <0.3 ng/mL (ref <0.30)

## 2011-08-24 LAB — HEMOGLOBIN A1C
Hgb A1c MFr Bld: 5.3 % (ref <5.7)
Mean Plasma Glucose: 105 mg/dL (ref <117)

## 2011-08-24 NOTE — H&P (Signed)
Internal Medicine Teaching Service Attending Note Date: 08/24/2011  Patient name: Brian Mclaughlin  Medical record number: [id number]  Date of birth: 1945-11-01    This patient has been seen and discussed with the house staff. Please see their note for complete details. I concur with their findings with the following additions/corrections: 66 year old alcoholic with hypertension, smoking who developed her found her right leg weakness and dysarthria this past week to the point that he was unable to move and unfortunately in had his stool in urinate where he was stuck on the floor. Apparently his girlfriend out of town and he had no one to immediately system. He apparently was able to  drag himself along the floor and at one point attempted to walk with the use of a broom stick. He is finally brought to the emergency department and has been found to have an acute left frontal parasagittal, posterior frontal parasagittal, left parietal parasagittal infarctions without hemorrhage. On telemetry overnight has been found to be in atrial fibrillation with a normal rate. Her writing further risk stratification and workup of his stroke including 2-D echocardiogram, carotid Dopplers. Are trying to aggressively manage his risk factors. One difficult area will be his atrial fibrillation. Clearly we should not anticoagulate him with heparin in this acute setting. The question is whether long-term Coumadin will be of benefit in this patient he was a heavy drinker.  Dr. Meredith Pel back in the morning.   Paulette Blanch Dam 08/24/2011, 1:15 PM

## 2011-08-24 NOTE — Consult Note (Addendum)
CARDIOLOGY CONSULT NOTE  Patient ID: Brian Mclaughlin, MRN: [id number], DOB/AGE: 1945/10/09 66 y.o. Admit date: 08/23/2011   Date of Consult: 08/24/2011 Primary Physician: has not been seeing one Primary Cardiologist: New  Chief Complaint: incontinence, altered mental status Reason for Consult: afib in setting of stroke  HPI: 66 y/o M with hx of HTN, tobacco/EtOH use presented to [facility] with complaints of altered mental status. He apparently was texting his girlfriend on Wednesday with messages that did not make sense or were blank. Sometime between Wednesday and Friday be gegan to develop numbness and weakness of his R thigh. On Friday he was found in his living room incontinent of stools holding a broom. Because he is a heavy drinker, his girlfriend did not think much of the events on Friday night and helped him clean up and get to bed. On Saturday morning he again had bowel incontinence and R arm weakness with confusion so he was brought to the hospital. By MRI he was found to have acute left frontal parasagittal, posterior frontal parasagittal, left parietal parasagittal infarctions without hemorrhage. He was also found to have rhabdomyolysis, acute renal insufficiency this admission. EKG has demonstrated atrial fibrillation with controlled ventricular response, which is a new finding for him. Brian Mclaughlin denies any knowledge of cardiac symptoms or previous cardiac workup - he denies CP, SOB, palpitations, syncope, nausea, vomiting. He currently feels well without complaint. He is somewhat of a poor historian with very abbreviated answers but answers questions appropriately.  Past Medical History  Diagnosis Date  . Hypertension   . Tobacco abuse   . ETOH abuse       Most Recent Cardiac Studies: None - echo is pending   Surgical History:  Past Surgical History  Procedure Date  . Eye muscle surgery     as a teenager "tighten his muscles"  . Back surgery 10 years ago    "slipped disc"  repair     Home Meds: None Prior to Admission medications   Not on File    Inpatient Medications:    . aspirin  300 mg Rectal Daily   Or  . aspirin  325 mg Oral Daily  . folic acid  1 mg Oral Daily  . LORazepam  0-4 mg Intravenous Q6H   Followed by  . LORazepam  0-4 mg Intravenous Q12H  . multivitamin with minerals  1 tablet Oral Daily  . thiamine  100 mg Oral Daily  . DISCONTD: atorvastatin  40 mg Oral q1800  . DISCONTD: thiamine  100 mg Intravenous Daily    Allergies:  Allergies  Allergen Reactions  . Codeine Rash  . Penicillins Rash    History   Social History  . Marital Status: Unknown    Spouse Name: N/A    Number of Children: 1  . Years of Education: N/A   Occupational History  . Unemployed Product manager    Retired   Social History Main Topics  . Smoking status: Current Everyday Smoker -- 0.5 packs/day for 45 years    Types: Cigarettes  . Smokeless tobacco: Not on file  . Alcohol Use: 0.0 oz/week     Drinks 6-8 beers daily  . Drug Use: No     denies any drug usage  . Sexually Active: Yes -- Male partner(s)   Other Topics Concern  . Not on file   Social History Narrative   Patient graduated HS and completed a 2 year associate's degree at [employer], unknown  degree type     Family History  Problem Relation Age of Onset  . Cancer Mother   . Cancer Father   . Multiple sclerosis Daughter      Review of Systems: General: negative for chills, fever, night sweats or weight changes.  Cardiovascular: see above Dermatological: negative for rash. He is quite tan but sits out on the back deck of his house often Respiratory: negative for cough or wheezing Urologic: negative for hematuria Abdominal: negative for nausea, vomiting, diarrhea, bright red blood per rectum, melena, or hematemesis Neurologic: see above All other systems reviewed and are otherwise negative except as noted above.  Labs:  [facility] 08/24/11 0540  09/02/11 1932 2011/09/02 1210  CKTOTAL 2039* 2703* 3920*  CKMB 7.3* 9.1* 13.1*  TROPONINI <0.30 <0.30 <0.30   Lab Results  Component Value Date   WBC 10.0 09-02-2011   HGB 16.3 02-Sep-2011   HCT 48.0 Sep 02, 2011   MCV 103.5* 09-02-11   PLT 127* 09-02-11    Lab 08/24/11 0540 09-02-11 1202  NA 140 --  K 3.6 --  CL 107 --  CO2 24 --  BUN 32* --  CREATININE 1.31 --  CALCIUM 8.7 --  PROT -- 7.5  BILITOT -- 2.0*  ALKPHOS -- 92  ALT -- 48  AST -- 152*  GLUCOSE 92 --   Lab Results  Component Value Date   CHOL 147 08/24/2011   HDL 59 08/24/2011   LDLCALC 75 08/24/2011   TRIG 64 08/24/2011   Radiology/Studies:  1. CT Head Wo Contrast 09/02/2011  *RADIOLOGY REPORT*  Clinical Data: Numbness on the right side for 4 days.  The patient is confused.  CT HEAD WITHOUT CONTRAST  Technique:  Contiguous axial images were obtained from the base of the skull through the vertex without contrast.  Comparison: None.  Findings: There is diffuse low density in the periventricular and subcortical white matter suggesting chronic changes.  There is a focal low density in the medial left frontal lobe which could represent edema and an infarct.  Age of this infarct is indeterminate but it could be subacute.  Small lacunar infarcts along the right white matter tracts.  The patient has mild cerebral atrophy.  No evidence for acute hemorrhage, mass lesion, midline shift or hydrocephalus.  Motion artifact near the skull base.  No acute bony abnormality.  IMPRESSION: Low density in the medial left frontal lobe is suggestive for an infarct.  Age of the infarct is uncertain but could be subacute.  No evidence for acute hemorrhage.  Atrophy and evidence of chronic small vessel ischemic changes.  Original Report Authenticated By: Richarda Overlie, M.D.   2. Brian Mclaughlin Head Wo Contrast 09-02-2011  *RADIOLOGY REPORT*  Clinical Data:  Right-sided weakness.  Alcoholism.  MRI HEAD WITHOUT CONTRAST MRA HEAD WITHOUT CONTRAST  Technique:  Multiplanar,  multiecho pulse sequences of the brain and surrounding structures were obtained without intravenous contrast. Angiographic images of the head were obtained using MRA technique without contrast.  Comparison:  CT head earlier today.  MRI HEAD  Findings:  Acute left frontal parasagittal, posterior frontal parasagittal, and left parietal parasagittal infarction without hemorrhage.  This corresponds to the CT abnormality.  Some of the more posterior areas of subcentimeter restricted diffusion are potentially in the left PCA territory, but the predominant area of involvement falls within the left ACA territory.  There is atrophy with chronic microvascular ischemic change. Scattered remote lacunes are noted in the basal ganglia and periventricular white matter.  Patent  intracranial vasculature. Normal pituitary and cerebellar tonsils.  Mild chronic sinus disease.  There are no foci of chronic hemorrhage. Slight left mastoid fluid.  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  Atrophy and small vessel disease.  MRA HEAD  Findings: Left internal carotid artery widely patent and slightly larger than the right.  Suspected 50% stenosis cavernous right ICA. Mild nonstenotic irregularity right ICA junction with right middle cerebral artery.  A1 segment right anterior cerebral artery atretic.  Both anterior cerebral arteries fill from the left.  No MCA stenosis.  Basilar artery widely patent with vertebrals codominant.  No PCA narrowing.  The A2 and A3 segments of the right anterior cerebral artery appear widely patent.  There is focal estimated 50% narrowing of the A2 segment left anterior cerebral artery which could contribute to the patient's pattern of infarction.  This narrowing is not necessarily flow reducing.  No proximal occlusion of the left ACA is observed. No cerebellar branch occlusion.  IMPRESSION: Suspected 50% stenosis right  ICA cavernous segment.  No ACA occlusion is observed, but proximal 50% stenosis  A2 segment left ACA could contribute to the observed pattern of infarction.  Original Report Authenticated By: Elsie Stain, M.D.   3. Brian Brain Wo Contrast 08/23/2011  *RADIOLOGY REPORT*  Clinical Data:  Right-sided weakness.  Alcoholism.  MRI HEAD WITHOUT CONTRAST MRA HEAD WITHOUT CONTRAST  Technique:  Multiplanar, multiecho pulse sequences of the brain and surrounding structures were obtained without intravenous contrast. Angiographic images of the head were obtained using MRA technique without contrast.  Comparison:  CT head earlier today.  MRI HEAD  Findings:  Acute left frontal parasagittal, posterior frontal parasagittal, and left parietal parasagittal infarction without hemorrhage.  This corresponds to the CT abnormality.  Some of the more posterior areas of subcentimeter restricted diffusion are potentially in the left PCA territory, but the predominant area of involvement falls within the left ACA territory.  There is atrophy with chronic microvascular ischemic change. Scattered remote lacunes are noted in the basal ganglia and periventricular white matter.  Patent intracranial vasculature. Normal pituitary and cerebellar tonsils.  Mild chronic sinus disease.  There are no foci of chronic hemorrhage. Slight left mastoid fluid.  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  Atrophy and small vessel disease.  MRA HEAD  Findings: Left internal carotid artery widely patent and slightly larger than the right.  Suspected 50% stenosis cavernous right ICA. Mild nonstenotic irregularity right ICA junction with right middle cerebral artery.  A1 segment right anterior cerebral artery atretic.  Both anterior cerebral arteries fill from the left.  No MCA stenosis.  Basilar artery widely patent with vertebrals codominant.  No PCA narrowing.  The A2  and A3 segments of the right anterior cerebral artery appear widely patent.  There is focal estimated 50% narrowing of the A2 segment left anterior cerebral artery which could contribute to the patient's pattern of infarction.  This narrowing is not necessarily flow reducing.  No proximal occlusion of the left ACA is observed. No cerebellar branch occlusion.  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  No ACA occlusion is observed, but proximal 50% stenosis  A2 segment left ACA could contribute to the observed pattern of infarction.  Original Report Authenticated By: Elsie Stain, M.D.   4. Chest Portable 1 View 08/23/2011  *RADIOLOGY REPORT*  Clinical Data: Altered mental status  PORTABLE CHEST - 1  VIEW  Comparison: None.  Findings: Cardiomediastinal silhouette is unremarkable.  No acute infiltrate or pleural effusion.  No pulmonary edema.  Mild degenerative changes thoracic spine.  IMPRESSION: No active disease.  Mild degenerative changes thoracic spine.  Original Report Authenticated By: Natasha Mead, M.D.    EKG: atrial fibrillation borderline IVCD non-specific ST-T changes  Physical Exam: Blood pressure 130/88, pulse 84, temperature 97.7 F (36.5 C), temperature source Oral, resp. rate 18, height 6' (1.829 m), weight 224 lb 4.8 oz (101.742 kg), SpO2 98.00%. General: Well developed WM in no acute distress. Head: Normocephalic, atraumatic, sclera non-icteric, no xanthomas, nares are without discharge. Pupils are both 2mm. Neck: Negative for carotid bruits. JVD not elevated. Lungs: Clear bilaterally to auscultation without wheezes, rales, or rhonchi. Breathing is unlabored. Heart: Irregularly irregular with S1 S2. No murmurs, rubs, or gallops appreciated. Abdomen: Soft, non-tender, non-distended with normoactive bowel sounds. No hepatomegaly. No rebound/guarding. No obvious abdominal masses. Msk:  Strength and tone appear normal for age. Extremities: No clubbing or cyanosis. No  edema.  Distal pedal pulses are 2+ and equal bilaterally. Neuro: Alert and oriented X 3. Moves all extremities spontaneously. Psych:  Responds to questions with a flat affect and somewhat abbreviated answers but does answer appropriately.   Assessment and Plan:   1. Acute CVA 2. Newly recognized atrial fibrillation of unclear duration, currently rate controlled 3. EtOH abuse  4. Frequent falls at home 5. Rhabdomyolysis 6. HTN 7. Tobacco abuse  The patient is currently asymptomatic from his atrial fibrillation which is of unclear duration. He is rate controlled without any agents at present. Would proceed with 2D echo which is pending. From an anticoagulation standpoint, although CHADS2 score is at least 3 he is not currently an ideal coumadin candidate due to hx of EtOH abuse and frequent falls. This can be readdressed as an outpatient if his mobility improves and if he demonstrates compliance. Will need to see how he does with PT. His blood pressure is currently controlled but diastolics tend to run high; consider initiation of low-dose BB/CCB once we are no longer running them higher in setting of acute stroke. We also raise the question of the utility of checking a ceruloplasmin level, given somewhat abnormal LFTs as well as recent "clumsiness" even when not intoxicated.   Signed, Dayna Dunn PA-C 08/24/2011, 4:11 PM  I have seen, examined the patient, and reviewed the above assessment and plan.  The patient has a h/o ongoing heavy ETOH.  He has been quite unsteady and has had multiple falls.  He presents with asymptomatic afib (likely a result of ETOH) which is presently rate controlled.  He has had a stroke, likely embolic from afib.  We will obtain an echo to evaluate for structural heart disease. His annual risk for recurrent stroke is at least 3 percent (likely higher).  He therefore should be anticoagulated with either coumadin or a novel anticoagulant long term.  Unfortunately, his heavy  ETOH, poor insight, and recent falls (particularly in the setting of ETOH) increase his risks with anticoagulation.  His HASBLED score is 3 (stroke, ETOH, and abnormal LFTS) suggesting increased bleeding risks long term.  I had a long and frank conversation with the patient today.  I have advised that if he is willing to quit ETOH and follow closely in our office that he be anticoagulated with coumadin.  Unfortunately, he does not think that he will reliably quit ETOH.  He does not think that he would be an appropriate coumadin  candidate.  I have asked the patient to further contemplate his decision. Given limited insight and concerns for noncompliance, he may not be an appropriate candidate for anticoagulation at this time.  If his echo is normal, then we will see him as needed. Please call with questions.  Co Sign: Hillis Range, MD 08/24/2011 8:48 PM

## 2011-08-24 NOTE — Progress Notes (Signed)
Pt's B/P has been somewhat high through the night. Paged teaching services and spoke with MD on call about it. No new orders were given, as this is stroke pt and we are not trying to lower his B/P at this time. However we will continue to monitor pt closely.
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
## 2011-08-24 NOTE — Progress Notes (Signed)
History: Brian Mclaughlin is an 66 y.o. male who was found by his girlfriend on 08/22/11 in the floor holding a broom in his own stool and urine. The girlfriend helped him to bed but found him in his stool and urine again today. Patient was felt to have right sided weakness. He has been numb on the right for 4 days. Patient is amnestic of these events. After being found again today the patient was brought in for evaluation. He feels that he has improved somewhat but continues to have right sided complaints.   LSN: 08/18/11  tPA Given: No: Outside time window  Subjective: I'm ok.  "Friend" at bedside says he's back to normal.  Objective: BP 133/84  Pulse 75  Temp(Src) 97.8 F (36.6 C) (Oral)  Resp 19  SpO2 99% Telemetry:AF  CBGs No results found for this basename: GLUCAP:10 in the last 72 hours  Diet: regular, thin Activity: up with assistance DVT Prophylaxis: SCDs  Medications: Scheduled:   . sodium chloride   Intravenous Once  . aspirin  300 mg Rectal Daily   Or  . aspirin  325 mg Oral Daily  . folic acid  1 mg Oral Daily  . LORazepam  0-4 mg Intravenous Q6H   Followed by  . LORazepam  0-4 mg Intravenous Q12H  . multivitamin with minerals  1 tablet Oral Daily  . thiamine  100 mg Oral Daily   Or  . thiamine  100 mg Intravenous Daily  . DISCONTD: atorvastatin  40 mg Oral q1800   Neurologic Exam: Mental Status: Alert, oriented, thought content appropriate.  Speech fluent without evidence of aphasia. Able to follow 3 step commands without difficulty. Flat affect.  Does not initiate conversation, but answers readily. Cranial Nerves: II- Visual fields grossly intact. III/IV/VI-Extraocular movements intact.  Slight disconjugate gaze; OD with slight extropic deviation, exacerbated with upward gaze; (chronic per patient's friend) V/VII-Smile symmetric VIII-hearing grossly intact XI-bilateral shoulder shrug XII-midline tongue extension Motor: 5-/5 RUE 5/5 LUE, slightly weaker grip on  right.  5-/5 RLE at hip flexor and extension, 5/5 LLE .  normal tone and bulk,  + Drift RUE Sensory: light touch decreased in the lower extremities Deep Tendon Reflexes: 1+ in the upper extremities and absent in the lower extremities Plantars: equivocal Cerebellar: Normal finger-to-nose, normal rapid alternating movements and normal heel-to-shin test.   Back: grapefruit sized dark purple area mid right bask with small superficial excoriation.  Lab Results: Basic Metabolic Panel:  Lab 08/24/11 4098 08/23/11 1222 08/23/11 1202  NA 140 140 --  K 3.6 4.6 --  CL 107 111 --  CO2 24 -- 23  GLUCOSE 92 91 --  BUN 32* 38* --  CREATININE 1.31 1.50* --  CALCIUM 8.7 -- 9.6  MG -- -- --  PHOS -- -- --   Liver Function Tests:  Lab 08/23/11 1202  AST 152*  ALT 48  ALKPHOS 92  BILITOT 2.0*  PROT 7.5  ALBUMIN 3.8    Lab 08/23/11 1203  AMMONIA 16   CBC:  Lab 08/23/11 1222 08/23/11 1202  WBC -- 10.0  NEUTROABS -- 7.0  HGB 16.3 15.5  HCT 48.0 44.6  MCV -- 103.5*  PLT -- 127*   Cardiac Enzymes:  Lab 08/24/11 0540 08/23/11 1932 08/23/11 1210  CKTOTAL 2039* 2703* 3920*  CKMB 7.3* 9.1* 13.1*  CKMBINDEX -- -- --  TROPONINI <0.30 <0.30 <0.30   Hemoglobin A1C:  Lab 08/23/11 1542  HGBA1C 5.3   Fasting Lipid Panel:  Lab  08/24/11 0540  CHOL 147  HDL 59  LDLCALC 75  TRIG 64  CHOLHDL 2.5  LDLDIRECT --   Thyroid Function Tests:  Lab 08/23/11 1542  TSH 1.869  T4TOTAL --  FREET4 --  T3FREE --  THYROIDAB --   Coagulation:  Lab 08/23/11 1202  LABPROT 13.7  INR 1.03   Anemia Panel:  Lab 08/23/11 1542  VITAMINB12 479  FOLATE --  FERRITIN --  TIBC --  IRON --  RETICCTPCT --   Urine Drug Screen: Drugs of Abuse     Component Value Date/Time   LABOPIA NONE DETECTED 08/23/2011 1620   COCAINSCRNUR NONE DETECTED 08/23/2011 1620   LABBENZ NONE DETECTED 08/23/2011 1620   AMPHETMU NONE DETECTED 08/23/2011 1620   THCU NONE DETECTED 08/23/2011 1620   LABBARB NONE DETECTED  08/23/2011 1620    Alcohol Level:  Lab 08/23/11 1202  ETH <11   Urinalysis:  Lab 08/23/11 1620  COLORURINE AMBER*  LABSPEC 1.033*  PHURINE 5.5  GLUCOSEU NEGATIVE  HGBUR TRACE*  BILIRUBINUR MODERATE*  KETONESUR 40*  PROTEINUR 30*  UROBILINOGEN 1.0  NITRITE NEGATIVE  LEUKOCYTESUR SMALL*   Study Results  08/23/2011  CT HEAD WITHOUT CONTRAST Findings: There is diffuse low density in the periventricular and subcortical white matter suggesting chronic changes.  There is a focal low density in the medial left frontal lobe which could represent edema and an infarct.  Age of this infarct is indeterminate but it could be subacute.  Small lacunar infarcts along the right white matter tracts.  The patient has mild cerebral atrophy.  No evidence for acute hemorrhage, mass lesion, midline shift or hydrocephalus.  Motion artifact near the skull base.  No acute bony abnormality.   IMPRESSION: Low density in the medial left frontal lobe is suggestive for an infarct.  Age of the infarct is uncertain but could be subacute.  No evidence for acute hemorrhage.  Atrophy and evidence of chronic small vessel ischemic changes.ADAM HENN, M.D.    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  This corresponds to the CT abnormality.  Some of the more posterior areas of subcentimeter restricted diffusion are potentially in the left PCA territory, but the predominant area of involvement falls within the left ACA territory.  There is atrophy with chronic microvascular ischemic change. Scattered remote lacunes are noted in the basal ganglia and periventricular white matter.  Patent intracranial vasculature. Normal pituitary and cerebellar tonsils.  Mild chronic sinus disease.  There are no foci of chronic hemorrhage. Slight left mastoid fluid.   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  Atrophy and small vessel disease. Elsie Stain, M.D.  08/23/11 MRA HEAD  Findings: Left internal carotid artery widely patent and slightly larger than the right.  Suspected 50% stenosis cavernous right ICA. Mild nonstenotic irregularity right ICA junction with right middle cerebral artery.  A1 segment right anterior cerebral artery atretic.  Both anterior cerebral arteries fill from the left.  No MCA stenosis.  Basilar artery widely patent with vertebrals codominant.  No PCA narrowing.  The A2 and A3 segments of the right anterior cerebral artery appear widely patent.  There is focal estimated 50% narrowing of the A2 segment left anterior cerebral artery which could contribute to the patient's pattern of infarction.  This narrowing is not necessarily flow reducing.  No proximal occlusion of the left ACA is observed.  No cerebellar branch occlusion.   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  No ACA occlusion is observed, but proximal 50% stenosis  A2 segment left ACA could contribute to the observed pattern of infarction. Elsie Stain, M.D.   08/23/2011 PORTABLE CHEST - 1 VIEW  Findings: Cardiomediastinal silhouette is unremarkable.  No acute infiltrate or pleural effusion.  No pulmonary edema.  Mild degenerative changes thoracic spine.   IMPRESSION: No active disease.  Mild degenerative changes thoracic spine. Lang Snow POP, M.D.   Assessment/Plan: 66 y.o. male ETOHism, found down by friend and brought in.  Had dysarthria and right sided weakness.  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 Flat affect and mild right hemiparesis noted on exam. Telemetry and ECG reveal Afib- patient was not on meds prior to admit, but says he's had irregular heart beat before.  Was never on coumadin.  Stroke Risk Factors -  hypertension, A fib  Patient drinks 6 to 8 beers a day by his history.  Rec:  1. Cards consult for Afib. Not sure if he will be a coumadin candidate due to heavy ETOH use and recurrent falls. 2. Follow therapy recommendations 3. Telemetry monitoring  4. Risk factor modification 5. Seizure precautions    6. EEG report pending 7. Secondary stroke prophylaxis-ASA presently.   8. Rec AA referral 9. Carotid doppler 10. 2D echo    LOS: 1 day   Marya Fossa PA-C [facility] [phone] 08/24/2011  10:39 AM  Lesly Dukes

## 2011-08-24 NOTE — Progress Notes (Signed)
SLP Cancellation Note  ST received order for SLE this date.  Evaluation not  completed as patient receiving medical procedure. ST to address on 08/25/11. Moreen Fowler MS, CCC-SLP [phone]  [facility] 08/24/2011, 11:54 AM
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
## 2011-08-24 NOTE — Progress Notes (Signed)
  Echocardiogram 2D Echocardiogram has been performed.  Brian Mclaughlin 08/24/2011, 12:21 PM

## 2011-08-25 ENCOUNTER — Inpatient Hospital Stay (HOSPITAL_COMMUNITY): Payer: Medicare Other

## 2011-08-25 DIAGNOSIS — F1010 Alcohol abuse, uncomplicated: Secondary | ICD-10-CM

## 2011-08-25 DIAGNOSIS — I6340 Cerebral infarction due to embolism of unspecified cerebral artery: Secondary | ICD-10-CM

## 2011-08-25 DIAGNOSIS — I509 Heart failure, unspecified: Secondary | ICD-10-CM

## 2011-08-25 DIAGNOSIS — G8190 Hemiplegia, unspecified affecting unspecified side: Secondary | ICD-10-CM

## 2011-08-25 DIAGNOSIS — I5021 Acute systolic (congestive) heart failure: Secondary | ICD-10-CM | POA: Diagnosis present

## 2011-08-25 LAB — BASIC METABOLIC PANEL
Calcium: 8.9 mg/dL (ref 8.4–10.5)
Creatinine, Ser: 1.22 mg/dL (ref 0.50–1.35)
GFR calc Af Amer: 70 mL/min — ABNORMAL LOW (ref >90)
GFR calc non Af Amer: 60 mL/min — ABNORMAL LOW (ref >90)
Sodium: 139 mEq/L (ref 135–145)

## 2011-08-25 MED ORDER — LABETALOL HCL 5 MG/ML IV SOLN
5.0000 mg | Freq: Once | INTRAVENOUS | Status: AC
  Administered 2011-08-25: 5 mg via INTRAVENOUS
  Filled 2011-08-25: qty 4

## 2011-08-25 MED ORDER — CARVEDILOL 6.25 MG PO TABS
6.2500 mg | ORAL_TABLET | Freq: Two times a day (BID) | ORAL | Status: DC
  Administered 2011-08-25 – 2011-08-26 (×2): 6.25 mg via ORAL
  Filled 2011-08-25 (×4): qty 1

## 2011-08-25 MED ORDER — METOPROLOL TARTRATE 12.5 MG HALF TABLET
12.5000 mg | ORAL_TABLET | Freq: Two times a day (BID) | ORAL | Status: DC
  Filled 2011-08-25 (×2): qty 1

## 2011-08-25 MED ORDER — LISINOPRIL 2.5 MG PO TABS
2.5000 mg | ORAL_TABLET | Freq: Every day | ORAL | Status: DC
  Administered 2011-08-25: 2.5 mg via ORAL
  Filled 2011-08-25 (×2): qty 1

## 2011-08-25 NOTE — Progress Notes (Signed)
Pt's B/P is elevated at 168/127. Spoke with Dr Milbert Coulter of Teaching service and advised to recheck  B/P in 15 min and call her with results.

## 2011-08-25 NOTE — Evaluation (Signed)
Occupational Therapy Evaluation Patient Details Name: Brian Mclaughlin MRN: [id number] DOB: [birth year] Today's Date: 08/25/2011 Time: 7829-5621 OT Time Calculation (min): 22 min  OT Assessment / Plan / Recommendation Clinical Impression  This 66 y.o. male admitted after being found on floor x 2 by girlfriend.  MRI positive for acute CVA. Pt. with h/o ETOH abuse.  Pt. demonstrates the below listed deficits that impact his abillity to perform ADLs safely.  Pt will need 24 hour supervision at discharge due to poor safety awareness, poor attention, and impaired balance.  Will benefit from OT to [facility] safety and independence with BADLs.    OT Assessment  Patient needs continued OT Services    Follow Up Recommendations  Home health OT;Supervision/Assistance - 24 hour    Barriers to Discharge Decreased caregiver support    Equipment Recommendations  Tub/shower seat    Recommendations for Other Services    Frequency  Min 3X/week    Precautions / Restrictions Precautions Precautions: Fall Restrictions Weight Bearing Restrictions: No       ADL  Eating/Feeding: Simulated;Independent Where Assessed - Eating/Feeding: Bed level Grooming: Simulated;Wash/dry hands;Wash/dry face;Teeth care;Minimal assistance Where Assessed - Grooming: Supported standing Upper Body Bathing: Simulated;Supervision/safety Where Assessed - Upper Body Bathing: Unsupported sitting Lower Body Bathing: Simulated;Minimal assistance Where Assessed - Lower Body Bathing: Supported sit to stand Upper Body Dressing: Simulated;Set up Where Assessed - Upper Body Dressing: Unsupported sitting Lower Body Dressing: Simulated;Minimal assistance Where Assessed - Lower Body Dressing: Sopported sit to stand Toilet Transfer: Simulated;Minimal assistance Toilet Transfer Method: Sit to stand Toilet Transfer Equipment: Comfort height toilet Toileting - Clothing Manipulation and Hygiene: Simulated;Minimal assistance Where Assessed -  Engineer, mining and Hygiene: Standing Equipment Used: Rolling walker Transfers/Ambulation Related to ADLs: Pt. requires cues for hand placement.  Min A to move sit to stand and min guard assist for ambulation.  Pt. acknowledges h/o of balance deficits and falls PTA, and acknowledges current balance deficits, but states it's due to being here (in hospital) all day ADL Comments: Pt. with decreased thoroughness.  Pt. does not inititate conversation.  Answers were very abbreviated.  Pt. is easily distracted    OT Diagnosis: Generalized weakness;Disturbance of vision;Cognitive deficits  OT Problem List: Decreased strength;Impaired balance (sitting and/or standing);Impaired vision/perception;Decreased cognition;Decreased safety awareness;Decreased knowledge of use of DME or AE OT Treatment Interventions: Self-care/ADL training;Neuromuscular education;DME and/or AE instruction;Therapeutic activities;Cognitive remediation/compensation;Visual/perceptual remediation/compensation;Patient/family education;Balance training   OT Goals Acute Rehab OT Goals OT Goal Formulation: With patient Time For Goal Achievement: 09/08/11 Potential to Achieve Goals: Fair ADL Goals Pt Will Perform Grooming: with supervision;Standing at sink ADL Goal: Grooming - Progress: Goal set today Pt Will Perform Upper Body Bathing: with set-up;Sitting, chair ADL Goal: Upper Body Bathing - Progress: Goal set today Pt Will Perform Lower Body Bathing: with supervision;Sit to stand from chair;Sit to stand from bed ADL Goal: Lower Body Bathing - Progress: Goal set today Pt Will Perform Upper Body Dressing: with supervision (for set up) ADL Goal: Upper Body Dressing - Progress: Goal set today Pt Will Perform Lower Body Dressing: with supervision;Sit to stand from bed;Sit to stand from chair ADL Goal: Lower Body Dressing - Progress: Goal set today Pt Will Transfer to Toilet: with supervision;Ambulation;Comfort height  toilet ADL Goal: Toilet Transfer - Progress: Goal set today Pt Will Perform Tub/Shower Transfer: Tub transfer;Ambulation;Shower seat with back (min guard assist) ADL Goal: Tub/Shower Transfer - Progress: Goal set today Additional ADL Goal #1: Pt. will maintain selective attention x 10 mins with min cues  during simple ADL task ADL Goal: Additional Goal #1 - Progress: Goal set today  Visit Information  Last OT Received On: 08/25/11 Assistance Needed: +1    Subjective Data  Subjective: "It's cause I've been here all day"  re: balance deficits Patient Stated Goal: "To get out of here"   Prior Functioning  Home Living Lives With: Other (Comment) (lives with "friend") Available Help at Discharge: Friend(s) (unable to stay with pt 24/7 due to work) Type of Home: House Home Access: Stairs to enter [employer] of Steps: 2 in front (no rails), 12 in back (bil. rails) Entrance Stairs-Rails: Can reach both Home Layout: Two level;1/2 bath on main level (no bedroom on first floor, no full bath) Alternate Level Stairs-Number of Steps: 15  Alternate Level Stairs-Rails: Left Bathroom Shower/Tub: Tub/shower unit;Door [employer] Toilet: Pharmacist, community: No Home Adaptive Equipment: None Prior Function Level of Independence: Independent with assistive device(s) (used a broom to help him walk. ) Able to Take Stairs?: Yes Driving: Yes Vocation: Retired Musician:  (slow to express answers but appropriate.) Dominant Hand: Right    Cognition  Overall Cognitive Status: Impaired Area of Impairment: Attention;Safety/judgement;Awareness of errors;Awareness of deficits;Problem solving Arousal/Alertness: Awake/alert Orientation Level: Appears intact for tasks assessed Behavior During Session: Flat affect Current Attention Level: Sustained Safety/Judgement: Decreased safety judgement for tasks assessed;Decreased awareness of need for  assistance;Impulsive Awareness of Errors: Assistance required to identify errors made Problem Solving: Pt. does not initate activity or conversation    Extremity/Trunk Assessment Right Upper Extremity Assessment RUE ROM/Strength/Tone: Within functional levels RUE Sensation: WFL - Light Touch RUE Coordination: WFL - gross/fine motor Left Upper Extremity Assessment LUE ROM/Strength/Tone: Within functional levels LUE Sensation: WFL - Light Touch LUE Coordination: WFL - gross/fine motor   Mobility Bed Mobility Bed Mobility: Supine to Sit;Sitting - Scoot to Edge of Bed;Sit to Supine Supine to Sit: 6: Modified independent (Device/Increase time) Sitting - Scoot to Edge of Bed: 6: Modified independent (Device/Increase time) Sit to Supine: 6: Modified independent (Device/Increase time) Transfers Transfers: Sit to Stand;Stand to Sit Sit to Stand: 4: Min assist;From bed;With upper extremity assist Stand to Sit: 4: Min guard;With upper extremity assist;To bed Details for Transfer Assistance: Pt. attempts to pull on walker, and was not successful with sit to stand, falling back on to bed. Did this repetetively despite cues.  Unable to adjust or change behavior or technique    Exercise    Balance    End of Session OT - End of Session Activity Tolerance: Patient tolerated treatment well Patient left: in bed;with call bell/phone within reach;with bed alarm set   Aundra Pung, Ursula Alert M 08/25/2011, 5:17 PM

## 2011-08-25 NOTE — Progress Notes (Signed)
VASCULAR LAB PRELIMINARY  PRELIMINARY  PRELIMINARY  PRELIMINARY  Carotid duplex completed.    Preliminary report:  Bilateral:  No evidence of hemodynamically significant internal carotid artery stenosis.   Vertebral artery flow is antegrade.     Camisha Srey D, RVS 08/25/2011, 10:47 AM

## 2011-08-25 NOTE — Progress Notes (Signed)
Stroke Team Progress Note  HISTORY Khaleel Beckom is an 66 y.o. male who was found by his girlfriend on 08/22/11 in the floor holding a broom in his own stool and urine. The girlfriend helped him to bed but found him in his stool and urine again today. Patient was felt to have right sided weakness. He has been numb on the right for 4 days. Patient is amnestic of these events. After being found again today the patient was brought in for evaluation. He feels that he has improved somewhat but continues to have right sided complaints. Patient was not a tPA candidate secondary to presenting beyond the window. He was admitted for further workup.  SUBJECTIVE His wife is at the bedside.  Overall he feels his condition is completely resolved. He agrees he has been noncompliant with his medications hand drinks 6-8 alcoholic drinks every day  OBJECTIVE Most recent Vital Signs: Filed Vitals:   08/25/11 0259 08/25/11 0424 08/25/11 0511 08/25/11 1000  BP: 160/120 162/115 163/116 143/90  Pulse: 80 73 79 78  Temp:   97.9 F (36.6 C) 98.3 F (36.8 C)  TempSrc:   Oral Oral  Resp:   18 18  Height:      Weight:      SpO2:   98% 99%   CBG (last 3)  No results found for this basename: GLUCAP:3 in the last 72 hours Intake/Output from previous day: 06/09 0701 - 06/10 0700 In: -  Out: 900 [Urine:900]  IV Fluid Intake:      . DISCONTD: sodium chloride 150 mL/hr at 08/23/11 1545    MEDICATIONS     . aspirin  300 mg Rectal Daily   Or  . aspirin  325 mg Oral Daily  . folic acid  1 mg Oral Daily  . labetalol  5 mg Intravenous Once  . LORazepam  0-4 mg Intravenous Q6H   Followed by  . LORazepam  0-4 mg Intravenous Q12H  . metoprolol tartrate  12.5 mg Oral BID  . multivitamin with minerals  1 tablet Oral Daily  . thiamine  100 mg Oral Daily   PRN:  acetaminophen, acetaminophen  Diet:  General thin liquids Activity:   Up with assistance DVT Prophylaxis:  SCD  CLINICALLY SIGNIFICANT STUDIES Basic  Metabolic Panel:   Lab 08/25/11 1122 08/24/11 0540  NA 139 140  K 3.9 3.6  CL 105 107  CO2 26 24  GLUCOSE 106* 92  BUN 20 32*  CREATININE 1.22 1.31  CALCIUM 8.9 8.7  MG -- --  PHOS -- --   Liver Function Tests:   Lab 08/23/11 1202  AST 152*  ALT 48  ALKPHOS 92  BILITOT 2.0*  PROT 7.5  ALBUMIN 3.8   CBC:   Lab 08/23/11 1222 08/23/11 1202  WBC -- 10.0  NEUTROABS -- 7.0  HGB 16.3 15.5  HCT 48.0 44.6  MCV -- 103.5*  PLT -- 127*   Coagulation:   Lab 08/23/11 1202  LABPROT 13.7  INR 1.03   Cardiac Enzymes:   Lab 08/24/11 0540 08/23/11 1932 08/23/11 1210  CKTOTAL 2039* 2703* 3920*  CKMB 7.3* 9.1* 13.1*  CKMBINDEX -- -- --  TROPONINI <0.30 <0.30 <0.30   Urinalysis:   Lab 08/23/11 1620  COLORURINE AMBER*  LABSPEC 1.033*  PHURINE 5.5  GLUCOSEU NEGATIVE  HGBUR TRACE*  BILIRUBINUR MODERATE*  KETONESUR 40*  PROTEINUR 30*  UROBILINOGEN 1.0  NITRITE NEGATIVE  LEUKOCYTESUR SMALL*   Lipid Panel    Component Value Date/Time  CHOL 147 08/24/2011 0540   TRIG 64 08/24/2011 0540   HDL 59 08/24/2011 0540   CHOLHDL 2.5 08/24/2011 0540   VLDL 13 08/24/2011 0540   LDLCALC 75 08/24/2011 0540   HgbA1C  Lab Results  Component Value Date   HGBA1C 5.3 08/23/2011    Urine Drug Screen:     Component Value Date/Time   LABOPIA NONE DETECTED 08/23/2011 1620   COCAINSCRNUR NONE DETECTED 08/23/2011 1620   LABBENZ NONE DETECTED 08/23/2011 1620   AMPHETMU NONE DETECTED 08/23/2011 1620   THCU NONE DETECTED 08/23/2011 1620   LABBARB NONE DETECTED 08/23/2011 1620    Alcohol Level:   Lab 08/23/11 1202  ETH <11   CT of the brain Low density in the medial left frontal lobe is suggestive for an infarct. Age of the infarct is uncertain but  could be subacute. No evidence for acute hemorrhage.  Atrophy and evidence of chronic small vessel ischemic changes  MRI of the brain  08/23/2011 Acute left frontal parasagittal, posterior frontal parasagittal, left parietal parasagittal infarctions without  hemorrhage.  Atrophy and small vessel disease.  MRA of the brain  08/23/2011  Suspected 50% stenosis right ICA cavernous segment.  No ACA occlusion is observed, but proximal 50% stenosis  A2 segment left ACA could contribute to the observed pattern of infarction.    2D Echocardiogram  EF 25-30% with no source of embolus.   Carotid Doppler  No internal carotid artery stenosis bilaterally. Vertebrals with antegrade flow bilaterally.   CXR  No active disease. Mild degenerative changes thoracic spine.   EKG  atrial fibrillation, rate 78.   Therapy Recommendations PT -hh, OT -  Physical Exam  Middle aged Caucasian male currently not in distress. Awake alert. Afebrile. Head is nontraumatic. Neck is supple without bruit. Hearing is normal. Cardiac exam no murmur or gallop. Lungs are clear to auscultation. Distal pulses are well felt.  Neurological exam   Awake  Alert oriented x 3. Normal speech and language.eye movements full without nystagmus.normal acuity and visual fields. Fundi were not visualized. Face symmetric. Tongue midline. Normal strength, tone, reflexes and coordination. Normal sensation. Gait deferred.  ASSESSMENT Mr. Kevan Prouty is a 66 y.o. 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 Infarct secondary to atrial fibrillation but given his presentation may have had a seizure at onset with Todds Paralysis.  On no known medications  prior to admission. Now on aspirin 325 mg orally every day for secondary stroke prevention. Patient with no resultant symptoms.  -hypertension -alcohol use -tobacco use -elevated LFTs  Hospital day # 2  TREATMENT/PLAN -add xarelto for secondary stroke prevention given atrial fibrillation, stop aspirin. -?EEG given suspicion of seizure -?antileptics- hold for now unless EEG is epileptiform. -discussed with patient, family and internal  medicine teaching    Guy Franco, [facility],  MBA, [facility]  [phone] Dr. Delia Heady  Scribe for Dr. Delia Heady who examined and discussed with the patient his plan of care.

## 2011-08-25 NOTE — Progress Notes (Signed)
SUBJECTIVE: The patient is doing well today.  At this time, he denies chest pain, shortness of breath, or any new concerns.     Marland Kitchen aspirin  300 mg Rectal Daily   Or  . aspirin  325 mg Oral Daily  . carvedilol  6.25 mg Oral BID WC  . folic acid  1 mg Oral Daily  . labetalol  5 mg Intravenous Once  . lisinopril  2.5 mg Oral Daily  . LORazepam  0-4 mg Intravenous Q6H   Followed by  . LORazepam  0-4 mg Intravenous Q12H  . multivitamin with minerals  1 tablet Oral Daily  . thiamine  100 mg Oral Daily  . DISCONTD: metoprolol tartrate  12.5 mg Oral BID      . DISCONTD: sodium chloride 150 mL/hr at 08/23/11 1545    OBJECTIVE: Physical Exam: Filed Vitals:   08/25/11 0424 08/25/11 0511 08/25/11 1000 08/25/11 1400  BP: 162/115 163/116 143/90 121/81  Pulse: 73 79 78 78  Temp:  97.9 F (36.6 C) 98.3 F (36.8 C) 98.5 F (36.9 C)  TempSrc:  Oral Oral Oral  Resp:  18 18 17   Height:      Weight:      SpO2:  98% 99% 96%    Intake/Output Summary (Last 24 hours) at 08/25/11 1711 Last data filed at 08/25/11 0514  Gross per 24 hour  Intake      0 ml  Output    900 ml  Net   -900 ml   GEN- The patient is  alert and oriented x 3 today.   Head- normocephalic, atraumatic Eyes-  Sclera clear, conjunctiva pink Ears- hearing intact Oropharynx- clear Neck- supple,   Lungs- Clear to ausculation bilaterally, normal work of breathing Heart- irregular rate and rhythm,  GI- soft, NT, ND, + BS Extremities- no clubbing, cyanosis, or edema  LABS: Basic Metabolic Panel:  Basename 08/25/11 1122 08/24/11 0540  NA 139 140  K 3.9 3.6  CL 105 107  CO2 26 24  GLUCOSE 106* 92  BUN 20 32*  CREATININE 1.22 1.31  CALCIUM 8.9 8.7  MG -- --  PHOS -- --   Liver Function Tests:  [facility] 08/23/11 1202  AST 152*  ALT 48  ALKPHOS 92  BILITOT 2.0*  PROT 7.5  ALBUMIN 3.8   No results found for this basename: LIPASE:2,AMYLASE:2 in the last 72 hours CBC:  Basename 08/23/11 1222  08/23/11 1202  WBC -- 10.0  NEUTROABS -- 7.0  HGB 16.3 15.5  HCT 48.0 44.6  MCV -- 103.5*  PLT -- 127*   Cardiac Enzymes:  Basename 08/24/11 0540 08/23/11 1932 08/23/11 1210  CKTOTAL 2039* 2703* 3920*  CKMB 7.3* 9.1* 13.1*  CKMBINDEX -- -- --  TROPONINI <0.30 <0.30 <0.30   BNP: No components found with this basename: POCBNP:3 D-Dimer: No results found for this basename: DDIMER:2 in the last 72 hours Hemoglobin A1C:  Basename 08/23/11 1542  HGBA1C 5.3   Fasting Lipid Panel:  Basename 08/24/11 0540  CHOL 147  HDL 59  LDLCALC 75  TRIG 64  CHOLHDL 2.5  LDLDIRECT --   Thyroid Function Tests:  Basename 08/23/11 1542  TSH 1.869  T4TOTAL --  T3FREE --  THYROIDAB --   Anemia Panel:  Basename 08/23/11 1542  VITAMINB12 479  FOLATE --  FERRITIN --  TIBC --  IRON --  RETICCTPCT --    RADIOLOGY: Ct Head Wo Contrast  08/23/2011  *RADIOLOGY REPORT*  Clinical Data: Numbness on  the right side for 4 days.  The patient is confused.  CT HEAD WITHOUT CONTRAST  Technique:  Contiguous axial images were obtained from the base of the skull through the vertex without contrast.  Comparison: None.  Findings: There is diffuse low density in the periventricular and subcortical white matter suggesting chronic changes.  There is a focal low density in the medial left frontal lobe which could represent edema and an infarct.  Age of this infarct is indeterminate but it could be subacute.  Small lacunar infarcts along the right white matter tracts.  The patient has mild cerebral atrophy.  No evidence for acute hemorrhage, mass lesion, midline shift or hydrocephalus.  Motion artifact near the skull base.  No acute bony abnormality.  IMPRESSION: Low density in the medial left frontal lobe is suggestive for an infarct.  Age of the infarct is uncertain but could be subacute.  No evidence for acute hemorrhage.  Atrophy and evidence of chronic small vessel ischemic changes.  Original Report  Authenticated By: Richarda Overlie, M.D.   Mr [name] Head Wo Contrast  08/23/2011  *RADIOLOGY REPORT*  Clinical Data:  Right-sided weakness.  Alcoholism.  MRI HEAD WITHOUT CONTRAST MRA HEAD WITHOUT CONTRAST  Technique:  Multiplanar, multiecho pulse sequences of the brain and surrounding structures were obtained without intravenous contrast. Angiographic images of the head were obtained using MRA technique without contrast.  Comparison:  CT head earlier today.  MRI HEAD  Findings:  Acute left frontal parasagittal, posterior frontal parasagittal, and left parietal parasagittal infarction without hemorrhage.  This corresponds to the CT abnormality.  Some of the more posterior areas of subcentimeter restricted diffusion are potentially in the left PCA territory, but the predominant area of involvement falls within the left ACA territory.  There is atrophy with chronic microvascular ischemic change. Scattered remote lacunes are noted in the basal ganglia and periventricular white matter.  Patent intracranial vasculature. Normal pituitary and cerebellar tonsils.  Mild chronic sinus disease.  There are no foci of chronic hemorrhage. Slight left mastoid fluid.  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  Atrophy and small vessel disease.  MRA HEAD  Findings: Left internal carotid artery widely patent and slightly larger than the right.  Suspected 50% stenosis cavernous right ICA. Mild nonstenotic irregularity right ICA junction with right middle cerebral artery.  A1 segment right anterior cerebral artery atretic.  Both anterior cerebral arteries fill from the left.  No MCA stenosis.  Basilar artery widely patent with vertebrals codominant.  No PCA narrowing.  The A2 and A3 segments of the right anterior cerebral artery appear widely patent.  There is focal estimated 50% narrowing of the A2 segment left anterior cerebral artery which could  contribute to the patient's pattern of infarction.  This narrowing is not necessarily flow reducing.  No proximal occlusion of the left ACA is observed. No cerebellar branch occlusion.  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  No ACA occlusion is observed, but proximal 50% stenosis  A2 segment left ACA could contribute to the observed pattern of infarction.  Original Report Authenticated By: Elsie Stain, M.D.   Mr Brain Wo Contrast  08/23/2011  *RADIOLOGY REPORT*  Clinical Data:  Right-sided weakness.  Alcoholism.  MRI HEAD WITHOUT CONTRAST MRA HEAD WITHOUT CONTRAST  Technique:  Multiplanar, multiecho pulse sequences of the brain and surrounding structures were obtained without intravenous contrast. Angiographic images of the head were obtained using MRA technique without contrast.  Comparison:  CT head earlier today.  MRI HEAD  Findings:  Acute left frontal parasagittal, posterior frontal parasagittal, and left parietal parasagittal infarction without hemorrhage.  This corresponds to the CT abnormality.  Some of the more posterior areas of subcentimeter restricted diffusion are potentially in the left PCA territory, but the predominant area of involvement falls within the left ACA territory.  There is atrophy with chronic microvascular ischemic change. Scattered remote lacunes are noted in the basal ganglia and periventricular white matter.  Patent intracranial vasculature. Normal pituitary and cerebellar tonsils.  Mild chronic sinus disease.  There are no foci of chronic hemorrhage. Slight left mastoid fluid.  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  Atrophy and small vessel disease.  MRA HEAD  Findings: Left internal carotid artery widely patent and slightly larger than the right.  Suspected 50% stenosis cavernous right ICA. Mild nonstenotic irregularity right ICA junction with right middle  cerebral artery.  A1 segment right anterior cerebral artery atretic.  Both anterior cerebral arteries fill from the left.  No MCA stenosis.  Basilar artery widely patent with vertebrals codominant.  No PCA narrowing.  The A2 and A3 segments of the right anterior cerebral artery appear widely patent.  There is focal estimated 50% narrowing of the A2 segment left anterior cerebral artery which could contribute to the patient's pattern of infarction.  This narrowing is not necessarily flow reducing.  No proximal occlusion of the left ACA is observed. No cerebellar branch occlusion.  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  No ACA occlusion is observed, but proximal 50% stenosis  A2 segment left ACA could contribute to the observed pattern of infarction.  Original Report Authenticated By: Elsie Stain, M.D.   Dg Chest Portable 1 View  08/23/2011  *RADIOLOGY REPORT*  Clinical Data: Altered mental status  PORTABLE CHEST - 1 VIEW  Comparison: None.  Findings: Cardiomediastinal silhouette is unremarkable.  No acute infiltrate or pleural effusion.  No pulmonary edema.  Mild degenerative changes thoracic spine.  IMPRESSION: No active disease.  Mild degenerative changes thoracic spine.  Original Report Authenticated By: Natasha Mead, M.D.    ASSESSMENT AND PLAN:  Principal Problem:  *Stroke Active Problems:  HTN (hypertension)  Alcohol abuse  Tobacco abuse  LFTs abnormal  Acute renal failure  Rhabdomyolysis  Atrial fibrillation  1. Persistent atrial fibrillation- asymptomatic, no indication for cardioversion presently.  Rate is controlled. As per my note from yesterday, he is at high risk for stroke.  He is also at high risk for bleeding (HASBLED score 3).   Fortunately, after further thought, he is now willing to quit ETOH.  I therefore think that anticoagulation is appropriate. I agree with IM that once daily xarelto is a reasonably option.  His Creatinine clearance is 72.  He  should therefore start xarelto 20mg  daily.  I would start this once ok with neurology.  Stop ASA once on xarelto.  2. Acute systolic dysfunction-  I have reviewed his echo which reveals newly depressed EF of unclear etiology.  I think that CAD, HTN, and ETOH are the likely causes of his depressed EF.  He is presently compensated. I will start low dose coreg and lisinopril.  These can be further uptitrated in the office once he has recovered from his stroke.  I will arrange for follow-up with my PA in 4 weeks.  At that time, he should also have a myoview to evaluate for ischemia.  3. HTN- add low dose coreg/ lisinopril but will not aggressively control BP until recovered from stroke.  No further inpatient cardiology recs planned. Please call with questions Will see as needed while here. Follow-up in my office to be arranged in 4 weeks.    Hillis Range, MD 08/25/2011 5:11 PM

## 2011-08-25 NOTE — Progress Notes (Signed)
Medical Student Daily Progress Note  Subjective: Patient did not sleep too well last night, mainly because he says he is ready to go home. He tells me this morning that he is willing to quit drinking in order to be able to take Coumadin. He says he thought about it yesterday and overnight and he realizes he needs the medication. When asking him open ended questions he will not answer. He can answer "yes" or "no" questions fine and questions that require simple 1-2 word answers but he will not engage in a long conversation. He says he will not go to a SNF or ALF and only wants to go home, particularly today. He also says he can sleep on the couch downstairs if need be 2/2 his fall risk. He is rather disinterested in our conversation this morning and is sitting comfortably in his chair eating breakfast.  Objective: Vital signs in last 24 hours: Filed Vitals:   08/25/11 0223 08/25/11 0259 08/25/11 0424 08/25/11 0511  BP: 168/125 160/120 162/115 163/116  Pulse: 83 80 73 79  Temp: 97.6 F (36.4 C)   97.9 F (36.6 C)  TempSrc: Oral   Oral  Resp: 18   18  Height:      Weight:      SpO2:    98%   Weight change:   Intake/Output Summary (Last 24 hours) at 08/25/11 0733 Last data filed at 08/25/11 0514  Gross per 24 hour  Intake      0 ml  Output    900 ml  Net   -900 ml   Physical Exam: BP 163/116  Pulse 79  Temp(Src) 97.9 F (36.6 C) (Oral)  Resp 18  Ht 6' (1.829 m)  Wt 101.742 kg (224 lb 4.8 oz)  BMI 30.42 kg/m2  SpO2 98% General appearance: alert, cooperative and no distress Head: Normocephalic, without obvious abnormality, atraumatic Eyes: conjunctivae/corneas clear. PERRL, EOM's intact. Fundi benign. Back: symmetric, no curvature. ROM normal. No CVA tenderness. Lungs: clear to auscultation bilaterally Chest wall: no tenderness Heart: irregularly irregular rhythm Abdomen: soft, non-tender; bowel sounds normal; no masses,  no organomegaly Extremities: extremities normal,  atraumatic, no cyanosis or edema Skin: Skin color, texture, turgor normal. No rashes or lesions Neuro: Fails accomodation reflex (L eye deviates medially then laterally), 1+ reflex UE, no reflex LE, DP pulse weak B/L, R LE 3/5, L LE 5/5, shuffling gait Lab Results: Basic Metabolic Panel:  Lab 08/24/11 6578 08/23/11 1222 08/23/11 1202  NA 140 140 --  K 3.6 4.6 --  CL 107 111 --  CO2 24 -- 23  GLUCOSE 92 91 --  BUN 32* 38* --  CREATININE 1.31 1.50* --  CALCIUM 8.7 -- 9.6  MG -- -- --  PHOS -- -- --   Liver Function Tests:  Lab 08/23/11 1202  AST 152*  ALT 48  ALKPHOS 92  BILITOT 2.0*  PROT 7.5  ALBUMIN 3.8   No results found for this basename: LIPASE:2,AMYLASE:2 in the last 168 hours  Lab 08/23/11 1203  AMMONIA 16   CBC:  Lab 08/23/11 1222 08/23/11 1202  WBC -- 10.0  NEUTROABS -- 7.0  HGB 16.3 15.5  HCT 48.0 44.6  MCV -- 103.5*  PLT -- 127*   Cardiac Enzymes:  Lab 08/24/11 0540 08/23/11 1932 08/23/11 1210  CKTOTAL 2039* 2703* 3920*  CKMB 7.3* 9.1* 13.1*  CKMBINDEX -- -- --  TROPONINI <0.30 <0.30 <0.30   BNP: No results found for this basename: PROBNP:3 in the last  168 hours D-Dimer: No results found for this basename: DDIMER:2 in the last 168 hours CBG: No results found for this basename: GLUCAP:6 in the last 168 hours Hemoglobin A1C:  Lab 08/23/11 1542  HGBA1C 5.3   Fasting Lipid Panel:  Lab 08/24/11 0540  CHOL 147  HDL 59  LDLCALC 75  TRIG 64  CHOLHDL 2.5  LDLDIRECT --   Thyroid Function Tests:  Lab 08/23/11 1542  TSH 1.869  T4TOTAL --  FREET4 --  T3FREE --  THYROIDAB --   Coagulation:  Lab 08/23/11 1202  LABPROT 13.7  INR 1.03   Anemia Panel:  Lab 08/23/11 1542  VITAMINB12 479  FOLATE --  FERRITIN --  TIBC --  IRON --  RETICCTPCT --   Urine Drug Screen: Drugs of Abuse     Component Value Date/Time   LABOPIA NONE DETECTED 08/23/2011 1620   COCAINSCRNUR NONE DETECTED 08/23/2011 1620   LABBENZ NONE DETECTED 08/23/2011 1620    AMPHETMU NONE DETECTED 08/23/2011 1620   THCU NONE DETECTED 08/23/2011 1620   LABBARB NONE DETECTED 08/23/2011 1620    Alcohol Level:  Lab 08/23/11 1202  ETH <11   Urinalysis:  Lab 08/23/11 1620  COLORURINE AMBER*  LABSPEC 1.033*  PHURINE 5.5  GLUCOSEU NEGATIVE  HGBUR TRACE*  BILIRUBINUR MODERATE*  KETONESUR 40*  PROTEINUR 30*  UROBILINOGEN 1.0  NITRITE NEGATIVE  LEUKOCYTESUR SMALL*    Micro Results: No results found for this or any previous visit (from the past 240 hour(s)). Studies/Results: Ct Head Wo Contrast  08/23/2011  *RADIOLOGY REPORT*  Clinical Data: Numbness on the right side for 4 days.  The patient is confused.  CT HEAD WITHOUT CONTRAST  Technique:  Contiguous axial images were obtained from the base of the skull through the vertex without contrast.  Comparison: None.  Findings: There is diffuse low density in the periventricular and subcortical white matter suggesting chronic changes.  There is a focal low density in the medial left frontal lobe which could represent edema and an infarct.  Age of this infarct is indeterminate but it could be subacute.  Small lacunar infarcts along the right white matter tracts.  The patient has mild cerebral atrophy.  No evidence for acute hemorrhage, mass lesion, midline shift or hydrocephalus.  Motion artifact near the skull base.  No acute bony abnormality.  IMPRESSION: Low density in the medial left frontal lobe is suggestive for an infarct.  Age of the infarct is uncertain but could be subacute.  No evidence for acute hemorrhage.  Atrophy and evidence of chronic small vessel ischemic changes.  Original Report Authenticated By: Richarda Overlie, M.D.   Mr [name] Head Wo Contrast  08/23/2011  *RADIOLOGY REPORT*  Clinical Data:  Right-sided weakness.  Alcoholism.  MRI HEAD WITHOUT CONTRAST MRA HEAD WITHOUT CONTRAST  Technique:  Multiplanar, multiecho pulse sequences of the brain and surrounding structures were obtained without intravenous contrast.  Angiographic images of the head were obtained using MRA technique without contrast.  Comparison:  CT head earlier today.  MRI HEAD  Findings:  Acute left frontal parasagittal, posterior frontal parasagittal, and left parietal parasagittal infarction without hemorrhage.  This corresponds to the CT abnormality.  Some of the more posterior areas of subcentimeter restricted diffusion are potentially in the left PCA territory, but the predominant area of involvement falls within the left ACA territory.  There is atrophy with chronic microvascular ischemic change. Scattered remote lacunes are noted in the basal ganglia and periventricular white matter.  Patent intracranial vasculature. Normal  pituitary and cerebellar tonsils.  Mild chronic sinus disease.  There are no foci of chronic hemorrhage. Slight left mastoid fluid.  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  Atrophy and small vessel disease.  MRA HEAD  Findings: Left internal carotid artery widely patent and slightly larger than the right.  Suspected 50% stenosis cavernous right ICA. Mild nonstenotic irregularity right ICA junction with right middle cerebral artery.  A1 segment right anterior cerebral artery atretic.  Both anterior cerebral arteries fill from the left.  No MCA stenosis.  Basilar artery widely patent with vertebrals codominant.  No PCA narrowing.  The A2 and A3 segments of the right anterior cerebral artery appear widely patent.  There is focal estimated 50% narrowing of the A2 segment left anterior cerebral artery which could contribute to the patient's pattern of infarction.  This narrowing is not necessarily flow reducing.  No proximal occlusion of the left ACA is observed. No cerebellar branch occlusion.  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  No ACA occlusion is observed, but proximal 50% stenosis  A2 segment left ACA could  contribute to the observed pattern of infarction.  Original Report Authenticated By: Elsie Stain, M.D.   Mr Brain Wo Contrast  08/23/2011  *RADIOLOGY REPORT*  Clinical Data:  Right-sided weakness.  Alcoholism.  MRI HEAD WITHOUT CONTRAST MRA HEAD WITHOUT CONTRAST  Technique:  Multiplanar, multiecho pulse sequences of the brain and surrounding structures were obtained without intravenous contrast. Angiographic images of the head were obtained using MRA technique without contrast.  Comparison:  CT head earlier today.  MRI HEAD  Findings:  Acute left frontal parasagittal, posterior frontal parasagittal, and left parietal parasagittal infarction without hemorrhage.  This corresponds to the CT abnormality.  Some of the more posterior areas of subcentimeter restricted diffusion are potentially in the left PCA territory, but the predominant area of involvement falls within the left ACA territory.  There is atrophy with chronic microvascular ischemic change. Scattered remote lacunes are noted in the basal ganglia and periventricular white matter.  Patent intracranial vasculature. Normal pituitary and cerebellar tonsils.  Mild chronic sinus disease.  There are no foci of chronic hemorrhage. Slight left mastoid fluid.  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  Atrophy and small vessel disease.  MRA HEAD  Findings: Left internal carotid artery widely patent and slightly larger than the right.  Suspected 50% stenosis cavernous right ICA. Mild nonstenotic irregularity right ICA junction with right middle cerebral artery.  A1 segment right anterior cerebral artery atretic.  Both anterior cerebral arteries fill from the left.  No MCA stenosis.  Basilar artery widely patent with vertebrals codominant.  No PCA narrowing.  The A2 and A3 segments of the right anterior cerebral artery appear widely patent.  There is focal estimated 50%  narrowing of the A2 segment left anterior cerebral artery which could contribute to the patient's pattern of infarction.  This narrowing is not necessarily flow reducing.  No proximal occlusion of the left ACA is observed. No cerebellar branch occlusion.  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  No ACA occlusion is observed, but proximal 50% stenosis  A2 segment left ACA could contribute to the observed pattern of infarction.  Original Report Authenticated By: Elsie Stain, M.D.   Dg Chest Portable 1 View  08/23/2011  *RADIOLOGY REPORT*  Clinical Data: Altered mental status  PORTABLE CHEST - 1 VIEW  Comparison: None.  Findings: Cardiomediastinal silhouette is unremarkable.  No acute infiltrate or pleural effusion.  No pulmonary edema.  Mild degenerative changes thoracic spine.  IMPRESSION: No active disease.  Mild degenerative changes thoracic spine.  Original Report Authenticated By: Natasha Mead, M.D.   Medications: I have reviewed the patient's current medications. Scheduled Meds:   . aspirin  300 mg Rectal Daily   Or  . aspirin  325 mg Oral Daily  . folic acid  1 mg Oral Daily  . labetalol  5 mg Intravenous Once  . LORazepam  0-4 mg Intravenous Q6H   Followed by  . LORazepam  0-4 mg Intravenous Q12H  . multivitamin with minerals  1 tablet Oral Daily  . thiamine  100 mg Oral Daily  . DISCONTD: atorvastatin  40 mg Oral q1800  . DISCONTD: thiamine  100 mg Intravenous Daily   Continuous Infusions:   . DISCONTD: sodium chloride 150 mL/hr at 08/23/11 1545   PRN Meds:.acetaminophen, acetaminophen Assessment/Plan:  Stroke - Patient has numerous risk factors for stroke (HTN, excessive etoh, afib). Last seen normal on Monday (08/18/11). Became confused over past 4 days and incontinent of bowel and bladder function. Although incontinence thought now to be due to inability to ambulate from the floor in setting of his AMS and right leg weakness/numbness at onset of symptoms. CT  head shows density in the medial left frontal lobe suggestive of an infarct. Could be subacute (defined as >24 hours to 6 weeks). MRI shows acute infarcts mainly left ACA territory (see read). Differential includes ischemic embolic event 2/2 afib (CHADS2 score =2, HF/HTN) vs. Atheroemboli.  - Appreciate neurology consult in the management of this patient  - Appreciate PT/OT and SLP assistance in the management of this patient - continue aspirin 325 mg daily for secondary prevention   Atrial fib - Unknown if new or old. Called [facility] on [address] and they do have patient in system but no information on patient as they converted to EMR 3-4 years ago and no data in computer. 2D echo reveals EF 25-30%, mild LVH, diffuse/severe hypokinesis and grade 2 diastolic dysfunction. Preliminary carotid dopplers reveal no significant stenosis and anterograde flow. He is not a good candidate currently for Coumadin given his compliance, fall risk, EtOH, and Pradaxa is a poor choice given his LFTs, however in the setting of his afib he is a patient that does need anticoagulation. - patients seems amenable to EtOH cessation, still has large fall risk and no supervision at home - Xarelto is a better choice in this patient considering his risks and LFTs and this decision will be made upon discharge  Hypertension - Patient's pressures on admission 142-169 (systolic) over 93-114 (diastolic). He has a h/o of untreated HTN. Pressures have remained increased (163/116) today. Pressures were allowed to be elevated (48 hours) on admission to maintain adequate CPP.  - received labetalol IV overnight x 1 dose for elevated pressures - start metoprolol 12.5 mg BID  Rhabdomyolysis - His CK,tot = 3920 and CK,MB = 13.1 on admission. This am, CK,tot= 2039 and CK,MB =7.3. Given that he was trapped on the floor for multiple days he may have rhabdo 2/2 confinement in a fixed position in setting of stroke, or due to  alcohol intoxication. Other causes can be arterial thrombus 2/2 afib, or extreme changes in body temp--hypo/hyperthermia (though much less likely). Since admission he has already began trending down x 3, especially with his IVF.  - discontinue NS @  150  - recheck CMP daily  - lipitor on hold in setting of rhabdo   Acute renal failure - BUN = 39. SCr = 1.56. Ratio =25 on admission. Improved further today (32 and 1.31). Differential includes rhabdomyolysis induced (increased myoglobin toxic to kidney) vs. BPH (patient complains of urgency, hesitancy, frequency, dribbling) vs. crush injury (patient lying on floor on back/side for days and has large bruise on lower right back). FeNa << 1.  - SCr is trending back to normal - recheck BMP in am  Increased MCV - Hgb 16.3, MCV = 103.5 on admission. Given that patient has a history of excessive alcohol abuse and recent starvation/decreased PO intake x 3-4 days, folic acid deficiency likely. His B12 = 479 (WNL).  - folic acid pending  - continue folic acid supplement   Alcohol abuse - Patient reports 6-8 beers daily, though could be more as he is a poor historian on admission. Will monitor him for s/s withdrawal. His ETOH level <11 on admit. His LFT's are abnormal with AST > (ALT x 2)  - CIWA protocol  - folic acid daily  - thiamine daily  - MV daily  - consult SW   Tobacco abuse - Patient has a h/o 0.5 PPD x 45 years. In setting of stroke, HTN, and acute nature of symptoms will hold off on nicotine replacement for now.  - smoking cessation consult   LOS: 2 days   This is a Psychologist, occupational Note.  The care of the patient was discussed with Dr. Lorretta Harp and the assessment and plan formulated with their assistance.  Please see their attached note for official documentation of the daily encounter.  Lewie Chamber 08/25/2011, 7:33 AM     Resident Addendum to Medical Student Note   I have seen and examined the patient, and agree with the the medical  student assessment and plan outlined above. Please see my brief note below for additional details.  OBJECTIVE: VS: Reviewed  Meds: Reviewed  Labs: Reviewed  Imaging: Reviewed   General: resting in bed, not in acute distress HEENT: PERRL, EOMI, no scleral icterus. No bruit and JVD. Cardiac: S1/S2, RRR, No murmurs, gallops or rubs Pulm: Good air movement bilaterally, Clear to auscultation bilaterally, No rales, wheezing, rhonchi or rubs. Abd: Soft,  nondistended, nontender, no rebound pain, no organomegaly, BS present. There is no CVA tenderness bilaterally. Ext: No rashes or edema, 2+DP/PT pulse bilaterally Skin: no rashes. No skin bruise. Neuro: alert and oriented X3, cranial nerves II-XII grossly intact, except for failure of conjugation of left eye and weaker palatal elevation on the left. Sensation to light touch intact. 1+ brachial reflex and very weak knee reflex bilaterally. Left ankle muscle strength is 2/5 on dorsal flexion (patient has chronic left ankle weakness for many years). Left leg muscle strength is 5/5.  Upper extremety muscle strength 5/5 bilaterally. right leg muscle strength is  5/5. Normal finger-to-nose. Negative Babinski's sign.  ASSESSMENT/ PLAN: # AMS:  Although patient claimed that he fell asleep on Wednesday and denies passing out, it is not completely clear about what happened in that day. Differential diagnosis includes seizure episode, syncope including vasovagal syncope, cardiac event, hypoglycemia, and neurologic event.   -Pending EEG   -cardiac monitor -seizure precaution  # Stroke - patient's symptoms is most likely caused by ischemic stroke given the findings on CT scan and MRI. A. fib is an important etiology. Patient also has other risk factors including hypertension and alcohol abuse. Other etiologies  include hypercoagulable status and paradoxical embolism. Regarding patient's incontinence, it is most likely caused by stroke. But other possibility needs  to be considered too, such as spinal cord lesions, Cauda Equina syndrome and seizure.  - Appreciate neurology consult in the management of this patient   - carotid dopplers, 2D echo pending   - continue aspirin 325 mg daily for secondary prevention   - PT/OT, speech eval    Atrial fib - etiology is not clear currently. The possible reasons include HTN, hyperthyroidism, COPD; ischemic heart disease, valvular disease and infection.  However it doesn't seem to be caused by cardiac ischemia given that the patient does not have any chest pain and EKG is negative for ischemia. Study showed that heparin is contraindicated in patient with A.Fib who presents with acute stroke. It is recommended to start Coumadin after 24-48 hours to decrease morbidity and mortality.  Generally, if the Atrial fibrillation happened within 48 hours, cardioversion can be performed. However, the exact onset time of his atrial Fibrilation is not clear. Patient does not have RVR currently. HTS is 1.869. His 2D Echo showed EF 25 to 30% and no thrombus and has has grade 2 diastolic dysfunction.   - consulted cariology, will follow up for recommendations. - will start metoprolol at 12.5 mg bid for both low EF and for his HTN. - will consider to add xarelto for secondary stroke prevention given atrial fibrillation and stop aspirin per neurology. Patient is heavy drinker and has high risk for fall. He is not good candidate for coumadin.  Hypertension - bp is 161/111.   -Will start Metoprolol today at 12.5 mg bid.   Rhabdomyolysis - improving. His total CK is trending down from 3920 to 2039 yesterday.  Given that he was trapped on the floor for multiple days he may have rhabdo 2/2 confinement in a fixed position in setting of stroke, or due to alcohol intoxication. Other causes can be arterial thrombus 2/2 afib, or extreme changes in body temp--hypo/hyperthermia (though much less likely).  - trend total CK. - lipitor on hold in  setting of rhabdo  Acute renal failure - improving . Today his Cre trending from 1.56 to 1.31. Most likely caused by rhabod, other DD vs. BPH (patient complains of urgency, hesitancy, frequency, dribbling).   Differential includes rhabdomyolysis induced (increased myoglobin toxic to kidney) vs. BPH (patient complains of urgency, hesitancy, frequency, dribbling) vs. crush injury (patient lying on floor on back/side for days and has large bruise on lower right back). FeNa << 1.  - continue NS @ 150   - follow up with BMP  Alcohol abuse - Patient reports 6-8 beers daily, though could be more as he is a poor historian on admission. Will monitor him for s/s withdrawal. His ETOH level <11 on admit. His LFT's are abnormal with AST > (ALT x 2) - CIWA protocol - folic acid and thiamine - consulted SW  Tobacco abuse - Patient has a h/o 1/2 PPD x 45 years. In setting of stroke, HTN, and acute nature of symptoms will hold off on nicotine replacement for now.  - smoking cessation consulted  Deposition: patient has been living alone with a male roommate (girl friend?) who does not live in the his home every day. Given his current healthy condition, temporary rehab facility placement is the best choice for him. Patient wants to go home. We talked to him in length and explained the importance for rehab facility placement. He agrees to think about it  and says "sounds reasonable".   Length of Stay: 2   Lorretta Harp, MD PGY1, Internal Medicine Teaching Service Pager: [phone]   08/25/2011, 3:37 PM

## 2011-08-25 NOTE — Evaluation (Signed)
Speech Language Pathology Evaluation Patient Details Name: Brian Mclaughlin MRN: [id number] DOB: [birth year] Today's Date: 08/25/2011 Time: 4782-9562 SLP Time Calculation (min): 15 min  Problem List:  Patient Active Problem List  Diagnoses  . Stroke  . HTN (hypertension)  . Alcohol abuse  . Tobacco abuse  . LFTs abnormal  . Acute renal failure  . Rhabdomyolysis  . Atrial fibrillation   Past Medical History:  Past Medical History  Diagnosis Date  . Hypertension   . Tobacco abuse   . ETOH abuse    Past Surgical History:  Past Surgical History  Procedure Date  . Eye muscle surgery     as a teenager "tighten his muscles"  . Back surgery 10 years ago    "slipped disc" repair   HPI:  66 yr old admitted with right leg numbness and weakness over several days.  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  PMH:  HTN   Assessment / Plan / Recommendation Clinical Impression  Pt. exhibited minimally decreased processing speed which he noted being aware of.  Speech is intelligible.  No difficulty with verbal comprehension or expressive language.  Overall, cogniton for activity in the acute care setting is WFL's.  Suspect he is at or close to his baseline.  Offered home health ST as an option but he felt that he did not need it.  SLP encouraged him to nottify MD if cognitive difficulties arise performing normal activities once home.       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[employer]) Orientation Level: Oriented to place;Oriented to situation Attention:  [facility] FOR FOCUSED AND SUSTATINED) Awareness:  Impaired Awareness Impairment: Anticipatory impairment Problem Solving: Appears intact Safety/Judgment: Appears intact    Comprehension  Auditory Comprehension Overall Auditory Comprehension: Appears within functional limits for tasks assessed Yes/No Questions: Not tested Commands: Within Functional Limits (FOR 3 STEP) Conversation:  (WFL) Visual Recognition/Discrimination Discrimination: Not tested Reading Comprehension Reading Status: Within funtional limits    Expression Expression Primary Mode of Expression: Verbal Verbal Expression Overall Verbal Expression: Appears within functional limits for tasks assessed Initiation: No impairment Level of Generative/Spontaneous Verbalization: Conversation Repetition: No impairment Naming: Not tested (NT DUE TO NO PROBLEMS NOTED IN CONVERSATION) Pragmatics: No impairment Written Expression Dominant Hand: Right Written Expression: Within Functional Limits   Oral / Motor Oral Motor/Sensory Function Overall Oral Motor/Sensory Function: Impaired Labial ROM: Within Functional Limits Labial Symmetry: Within Functional Limits Labial Strength: Within Functional Limits Lingual ROM: Reduced right;Reduced left (IMPRECISE AND SLOW MOVEMENTS) Lingual Symmetry: Within Functional Limits Lingual Strength: Within Functional Limits Facial ROM: Within Functional Limits Facial Symmetry: Within Functional Limits Facial Strength: Within Functional Limits Velum: Within Functional Limits Mandible: Within Functional Limits Motor Speech Overall Motor Speech: Appears within functional limits for tasks assessed Respiration: Within functional limits Phonation: Normal Resonance: Within functional limits Articulation: Within functional limitis Intelligibility: Intelligible Motor Planning: Witnin functional limits Motor Speech Errors: Not applicable     Royce Macadamia M.Ed [employer] [phone]  08/25/2011

## 2011-08-25 NOTE — Progress Notes (Addendum)
B/P rechecked, still high 160/120, Dr  Milbert Coulter notified, orders given and carried, pt sts no complaints, resting quietly, will continue to monitor pt closely.

## 2011-08-25 NOTE — Progress Notes (Signed)
Physical Therapy Treatment Patient Details Name: Brian Mclaughlin MRN: [id number] DOB: 04-Feb-1946 Today's Date: 08/25/2011 Time: 4098-1191 PT Time Calculation (min): 30 min  PT Assessment / Plan / Recommendation Comments on Treatment Session  pt presents with ? TIA.  pt has long history of balance deficits due to Etoh use.  pt will need 24hr S at home due to decreased cognition and poor awareness of safety.      Follow Up Recommendations  Home health PT;Supervision/Assistance - 24 hour    Barriers to Discharge        Equipment Recommendations  Rolling walker with 5" wheels;Tub/shower seat    Recommendations for Other Services    Frequency Min 4X/week   Plan Discharge plan remains appropriate;Frequency remains appropriate    Precautions / Restrictions Precautions Precautions: Fall Restrictions Weight Bearing Restrictions: No   Pertinent Vitals/Pain Denies any pain.      Mobility  Bed Mobility Bed Mobility: Supine to Sit Supine to Sit: 6: Modified independent (Device/Increase time);With rails Details for Bed Mobility Assistance: Pt requires rails to sit, increased time needed to complete task. Transfers Transfers: Sit to Stand;Stand to Sit Sit to Stand: 4: Min guard;With upper extremity assist;From bed Stand to Sit: 4: Min guard;With upper extremity assist;To bed (to W/C at end of session) Details for Transfer Assistance: pt needs safety cues for use of UEs, controlling descent, increased BOS Ambulation/Gait Ambulation/Gait Assistance: 4: Min guard Ambulation Distance (Feet): 200 Feet Assistive device: Rolling walker Ambulation/Gait Assistance Details: cues for safe use of RW as pt tends to pick it up off floor for turns and thresholds and at times stumbles due to LOB.  pt ed on safe RW use and slowing down to prevent falls.   Gait Pattern: Step-through pattern;Decreased stride length;Narrow base of support Stairs: Yes Stairs Assistance: 4: Min assist Stairs Assistance Details  (indicate cue type and reason): Performed stairs with RW backwards up 2 steps and then did flight of stairs with L rail.  pt requires max safety cues and cues to slow down secondary to impulsive.   Stair Management Technique: One rail Left;Sideways;Backwards;With walker Number of Stairs: 2  (then 11 with L rail) Wheelchair Mobility Wheelchair Mobility: No Modified Rankin (Stroke Patients Only) Modified Rankin: Moderately severe disability    Exercises     PT Diagnosis:    PT Problem List:   PT Treatment Interventions:     PT Goals Acute Rehab PT Goals Time For Goal Achievement: 09/07/11 PT Goal: Supine/Side to Sit - Progress: Progressing toward goal PT Goal: Sit to Stand - Progress: Progressing toward goal PT Goal: Stand to Sit - Progress: Progressing toward goal PT Goal: Ambulate - Progress: Progressing toward goal PT Goal: Up/Down Stairs - Progress: Progressing toward goal  Visit Information  Last PT Received On: 08/25/11 Assistance Needed: +1    Subjective Data  Subjective: "Ok" -pt with minimal verbalizations.     Cognition  Overall Cognitive Status: Impaired Area of Impairment: Safety/judgement;Problem solving Arousal/Alertness: Awake/alert Orientation Level: Appears intact for tasks assessed Behavior During Session: Flat affect Safety/Judgement: Decreased safety judgement for tasks assessed;Decreased awareness of need for assistance;Impulsive Problem Solving: Very, very slow processing at times. Relies on "friend" to answer questions at times because it takes him too much time to answer.    Balance  Balance Balance Assessed: Yes Static Standing Balance Static Standing - Balance Support: Bilateral upper extremity supported Static Standing - Level of Assistance: 5: Stand by assistance Static Standing - Comment/# of Minutes: with Bil UEs on  RW, pt able to maintain balance with only supervision and occasional safety cues.    End of Session PT - End of  Session Equipment Utilized During Treatment: Gait belt Activity Tolerance: Patient tolerated treatment well Patient left:  (in W/C with Transporter for Vascular lab.  ) Nurse Communication: Mobility status    Sunny Schlein, [place] [phone] 08/25/2011, 10:39 AM

## 2011-08-25 NOTE — Clinical Social Work Psychosocial (Signed)
     Clinical Social Work Department BRIEF PSYCHOSOCIAL ASSESSMENT 08/25/2011  Patient:  Brian Mclaughlin, Brian Mclaughlin     Account Number:  [id number]     Admit date:  08/23/2011  Clinical Social Worker:  Lourdes Sledge  Date/Time:  08/25/2011 11:55 AM  Referred by:  Physician  Date Referred:  08/22/2011 Referred for  Substance Abuse   Other Referral:   SNF Placement   Interview type:  Patient Other interview type:   CSW also spoke to pt friend Brian Mclaughlin    PSYCHOSOCIAL DATA Living Status:  FRIEND(S) Admitted from facility:   Level of care:   Primary support name:  Brian Mclaughlin Primary support relationship to patient:  FRIEND Degree of support available:   Pt lives with a roommate who is involved in pt care.    CURRENT CONCERNS Current Concerns  Substance Abuse   Other Concerns:   SNF placement    SOCIAL WORK ASSESSMENT / PLAN Pt received referral for substance abuse and SNF placement.    Before CSW arrived to pt room CSW received a call from RN that pt fiend Brian Mclaughlin was in pt room and wanted to speak to CSW. CSW contacted pt room and spoke with pt who gave CSW consent to speak to his friend. CSW spoke with Brian Mclaughlin who had questions pertaining to how she could assist pt in applying for Medicaid. CSW informed Brian Mclaughlin that pt would need to go to DSS to apply for Medicaid and if unable to go he could complete the application at home and mail it in. Brian Mclaughlin appreciated CSW information and had no additional questions.  CSW asked to speak to pt. CSW informed pt of recommendation from PT for SNF placement. Pt quickly stated he was not agreeable and wanted to go home. CSW inquired whether CSW could visit pt and discuss resources for substance abuse. Pt stated he was going to stop drinking on his own and was not interested in resources. Pt declined resources and to complete SBIRT. CSW signing off.   Assessment/plan status:  No Further Intervention Required Other assessment/ plan:     Information/referral to community resources:   CSW attempted to assist/provide SNF list for pt however pt declined and stated he would be returning home. CSW also attempted to discuss pt substance abuse and whether CSW could provide resources however pt declined any resources. CSW unable to complete SBIRT.    PATIENTS/FAMILYS RESPONSE TO PLAN OF CARE: Pt was alert and appeared oriented. Pt able to answer questions appropriately with CSW. Pt lives with roommate Brian Mclaughlin assists pt. Pt not interested in discussing SNF placement or substsnace abuse with CSW. CSW signing off.

## 2011-08-25 NOTE — Care Management Note (Signed)
    Page 1 of 2   09/01/2011     2:43:01 PM   CARE MANAGEMENT NOTE 09/01/2011  Patient:  Brian Mclaughlin, Brian Mclaughlin   Account Number:  [id number]  Date Initiated:  08/25/2011  Documentation initiated by:  Onnie Boer  Subjective/Objective Assessment:   PT WAS ADMITTED WITH CVA     Action/Plan:   PROGRESSION OF CARE AND DISHCARGE PLANNING   Anticipated DC Date:  08/27/2011   Anticipated DC Plan:  HOME W HOME HEALTH SERVICES  In-house referral  Clinical Social Worker      DC Planning Services  CM consult      Choice offered to / List presented to:  [name]   DME arranged  WALKER - ROLLING  SHOWER STOOL      DME agency  [facility]     HH arranged  HH-2 PT  HH-3 OT      [facility] agency  [facility]   Status of service:  Completed, signed off Medicare Important Message given?   (If response is "NO", the following Medicare IM given date fields will be blank) Date Medicare IM given:   Date Additional Medicare IM given:    Discharge Disposition:  SKILLED NURSING FACILITY  Per UR Regulation:  Reviewed for med. necessity/level of care/duration of stay  If discussed at Long Length of Stay Meetings, dates discussed:    Comments:  08/29/11 Onnie Boer, RN, BSN 1545 PT HAD EXPERIENCED WEAKNESS AND DECREASED BP WHILE WORKING WITH PT.  PT HAS RETURNED TO HIS BASELINE AND IS TO DC TO SNF WHEN HE IS MEDICALLY STABLE.  08/27/11 Brian Cohen, RN,BSN [phone]) 1158 PT NEEDS XERELTO 20 MG DAILY, PT HAS INSURANCE AND HIS COPAY WILL BE $45 A MONTH.  PT IS STATING THAT HE WILL NOT BE ABLE TO AFFORD THIS.  I HAVE CONTACTED THE NEEDY MED COMPANY AND THEY ARE NOT WILLING TO ASSIST PT DUE TO HIM HAVING INSURANCE.  DR. Youlanda Roys HAS BEEN MADE AWARE OF THIS ISSUE.  WILL F/U ON HH NEEDS AND ORDERS.  08/26/11 Onnie Boer, RN, BSN 1516 PT IS AGREEABLE TODAY TO HH PT/OT.  WILL F/U WITH [facility]  08/25/11 Onnie Boer, RN, BSN 1224 PT WAS ADMITTED WITH CVA.  PTA PT WAS AT HOME  ALONE.  PT DRINKS AND WAS AT HOME ALONE DURING HIS CVA.  PT LIVES WITH HIS GIRLFRIEND.  SHE WORKS AND WILL NOT BE ABLE TO PROVIDE 24 HR SUPPORT.  PT IS REFUSING HH PT AND WOULD LIKE HIS DME'S FROM [facility]  ONCE ORDERS ARE PLACED I WILL ARRANGE THE DME'S.  WILL F/U ON DC NEEDS

## 2011-08-25 NOTE — Progress Notes (Signed)
Portable EEG completed

## 2011-08-25 NOTE — Progress Notes (Signed)
Internal Medicine Attending  Date: 08/25/2011  Patient name: Brian Mclaughlin Medical record number: [id number] Date of birth: 05/01/1945 Age: 66 y.o. Gender: male  I saw and evaluated the patient on a.m. rounds with house staff. I reviewed the resident's note by Dr. Clyde Lundborg and I agree with the resident's findings and plans as documented in his note.

## 2011-08-26 LAB — BASIC METABOLIC PANEL
Calcium: 8.8 mg/dL (ref 8.4–10.5)
GFR calc Af Amer: 70 mL/min — ABNORMAL LOW (ref >90)
GFR calc non Af Amer: 60 mL/min — ABNORMAL LOW (ref >90)
Glucose, Bld: 89 mg/dL (ref 70–99)
Potassium: 3.7 mEq/L (ref 3.5–5.1)
Sodium: 139 mEq/L (ref 135–145)

## 2011-08-26 LAB — RPR: RPR Ser Ql: NONREACTIVE

## 2011-08-26 MED ORDER — PNEUMOCOCCAL VAC POLYVALENT 25 MCG/0.5ML IJ INJ
0.5000 mL | INJECTION | Freq: Once | INTRAMUSCULAR | Status: AC
  Administered 2011-08-26: 0.5 mL via INTRAMUSCULAR
  Filled 2011-08-26: qty 0.5

## 2011-08-26 MED ORDER — CARVEDILOL 12.5 MG PO TABS
12.5000 mg | ORAL_TABLET | Freq: Two times a day (BID) | ORAL | Status: DC
  Administered 2011-08-26 – 2011-08-27 (×2): 12.5 mg via ORAL
  Filled 2011-08-26 (×4): qty 1

## 2011-08-26 MED ORDER — HYDRALAZINE HCL 20 MG/ML IJ SOLN
10.0000 mg | Freq: Three times a day (TID) | INTRAMUSCULAR | Status: DC | PRN
  Administered 2011-08-27: 10 mg via INTRAVENOUS
  Filled 2011-08-26 (×2): qty 0.5

## 2011-08-26 MED ORDER — HYDRALAZINE HCL 20 MG/ML IJ SOLN
5.0000 mg | Freq: Once | INTRAMUSCULAR | Status: AC
  Administered 2011-08-26: 5 mg via INTRAVENOUS
  Filled 2011-08-26: qty 0.25

## 2011-08-26 MED ORDER — RIVAROXABAN 10 MG PO TABS
20.0000 mg | ORAL_TABLET | Freq: Every day | ORAL | Status: DC
  Administered 2011-08-26 – 2011-08-31 (×5): 20 mg via ORAL
  Filled 2011-08-26 (×7): qty 2

## 2011-08-26 MED ORDER — LISINOPRIL 5 MG PO TABS
5.0000 mg | ORAL_TABLET | Freq: Every day | ORAL | Status: DC
  Administered 2011-08-26 – 2011-08-27 (×2): 5 mg via ORAL
  Filled 2011-08-26 (×2): qty 1

## 2011-08-26 MED ORDER — PNEUMOCOCCAL 13-VAL CONJ VACC IM SUSP: 0.5000 mL | Freq: Once | INTRAMUSCULAR | Status: DC

## 2011-08-26 MED ORDER — LABETALOL HCL 5 MG/ML IV SOLN
5.0000 mg | Freq: Once | INTRAVENOUS | Status: AC
  Administered 2011-08-26: 5 mg via INTRAVENOUS
  Filled 2011-08-26: qty 4

## 2011-08-26 NOTE — Progress Notes (Signed)
Medical Student Daily Progress Note  Subjective: Patient slept well overnight. He has no complaints this morning and is feeling well. He adamantly refuses SNF and only wants to go home as soon as possible. He denies any fevers, chills, night sweats, changes in vision, hearing, or changes in bowel and bladder. He is resting in bed upon exam.  Objective: Vital signs in last 24 hours: Filed Vitals:   08/25/11 2200 08/26/11 0200 08/26/11 0600 08/26/11 0818  BP: 147/98 173/110 170/108 132/89  Pulse: 68 72 73 82  Temp: 98.4 F (36.9 C) 97.9 F (36.6 C) 98.4 F (36.9 C)   TempSrc:      Resp: 18 18 18    Height:      Weight:      SpO2: 99% 97% 97%    Weight change:   Intake/Output Summary (Last 24 hours) at 08/26/11 0912 Last data filed at 08/25/11 2105  Gross per 24 hour  Intake    120 ml  Output      0 ml  Net    120 ml   Physical Exam: BP 127/83  Pulse 72  Temp(Src) 98.3 F (36.8 C) (Oral)  Resp 18  Ht 6' (1.829 m)  Wt 101.742 kg (224 lb 4.8 oz)  BMI 30.42 kg/m2  SpO2 98% General appearance: alert, cooperative and no distress Head: Normocephalic, without obvious abnormality, atraumatic Back: symmetric, no curvature. ROM normal. No CVA tenderness. Lungs: clear to auscultation bilaterally Chest wall: no tenderness Heart: irregularly irregular rhythm Abdomen: soft, non-tender; bowel sounds normal; no masses,  no organomegaly Extremities: extremities normal, atraumatic, no cyanosis or edema Skin: Skin color, texture, turgor normal. No rashes or lesions Neurologic: Mental status: Alert, oriented, thought content appropriate Cranial nerves:  I: smell Not tested  II: visual acuity  OS:     OD:   II: visual fields Full to confrontation; Fails accomodation (L eye deviates laterally)  II: pupils Equal, round, reactive to light  III,VII: ptosis None  III,IV,VI: extraocular muscles  Full ROM  V: mastication Normal  V: facial light touch sensation  Normal  V,VII: corneal  reflex  Present  VII: facial muscle function - upper  Normal  VII: facial muscle function - lower Normal  VIII: hearing Not tested  IX: soft palate elevation  Deviates R  IX,X: gag reflex Present  XI: trapezius strength  5/5  XI: sternocleidomastoid strength 5/5  XI: neck flexion strength  5/5  XII: tongue strength  Normal   Sensory: normal Motor: Right thigh/leg 4/5, Left thigh/leg 5/5 Reflexes: 1+ UE, absent LE Lab Results: Basic Metabolic Panel:  Lab 08/26/11 1191 08/25/11 1122  NA 139 139  K 3.7 3.9  CL 104 105  CO2 25 26  GLUCOSE 89 106*  BUN 21 20  CREATININE 1.22 1.22  CALCIUM 8.8 8.9  MG -- --  PHOS -- --   Liver Function Tests:  Lab 08/23/11 1202  AST 152*  ALT 48  ALKPHOS 92  BILITOT 2.0*  PROT 7.5  ALBUMIN 3.8   No results found for this basename: LIPASE:2,AMYLASE:2 in the last 168 hours  Lab 08/23/11 1203  AMMONIA 16   CBC:  Lab 08/23/11 1222 08/23/11 1202  WBC -- 10.0  NEUTROABS -- 7.0  HGB 16.3 15.5  HCT 48.0 44.6  MCV -- 103.5*  PLT -- 127*   Cardiac Enzymes:  Lab 08/24/11 0540 08/23/11 1932 08/23/11 1210  CKTOTAL 2039* 2703* 3920*  CKMB 7.3* 9.1* 13.1*  CKMBINDEX -- -- --  TROPONINI <0.30 <0.30 <0.30   BNP: No results found for this basename: PROBNP:3 in the last 168 hours D-Dimer: No results found for this basename: DDIMER:2 in the last 168 hours CBG: No results found for this basename: GLUCAP:6 in the last 168 hours Hemoglobin A1C:  Lab 08/23/11 1542  HGBA1C 5.3   Fasting Lipid Panel:  Lab 08/24/11 0540  CHOL 147  HDL 59  LDLCALC 75  TRIG 64  CHOLHDL 2.5  LDLDIRECT --   Thyroid Function Tests:  Lab 08/23/11 1542  TSH 1.869  T4TOTAL --  FREET4 --  T3FREE --  THYROIDAB --   Coagulation:  Lab 08/23/11 1202  LABPROT 13.7  INR 1.03   Anemia Panel:  Lab 08/23/11 1542  VITAMINB12 479  FOLATE --  FERRITIN --  TIBC --  IRON --  RETICCTPCT --   Urine Drug Screen: Drugs of Abuse     Component Value  Date/Time   LABOPIA NONE DETECTED 08/23/2011 1620   COCAINSCRNUR NONE DETECTED 08/23/2011 1620   LABBENZ NONE DETECTED 08/23/2011 1620   AMPHETMU NONE DETECTED 08/23/2011 1620   THCU NONE DETECTED 08/23/2011 1620   LABBARB NONE DETECTED 08/23/2011 1620    Alcohol Level:  Lab 08/23/11 1202  ETH <11   Urinalysis:  Lab 08/23/11 1620  COLORURINE AMBER*  LABSPEC 1.033*  PHURINE 5.5  GLUCOSEU NEGATIVE  HGBUR TRACE*  BILIRUBINUR MODERATE*  KETONESUR 40*  PROTEINUR 30*  UROBILINOGEN 1.0  NITRITE NEGATIVE  LEUKOCYTESUR SMALL*    Micro Results: No results found for this or any previous visit (from the past 240 hour(s)). Studies/Results: No results found. Medications: I have reviewed the patient's current medications. Scheduled Meds:   . carvedilol  12.5 mg Oral BID WC  . folic acid  1 mg Oral Daily  . hydrALAZINE  5 mg Intravenous Once  . labetalol  5 mg Intravenous Once  . lisinopril  5 mg Oral Daily  . LORazepam  0-4 mg Intravenous Q6H   Followed by  . LORazepam  0-4 mg Intravenous Q12H  . multivitamin with minerals  1 tablet Oral Daily  . rivaroxaban  20 mg Oral Daily  . thiamine  100 mg Oral Daily  . DISCONTD: aspirin  300 mg Rectal Daily  . DISCONTD: aspirin  325 mg Oral Daily  . DISCONTD: carvedilol  6.25 mg Oral BID WC  . DISCONTD: lisinopril  2.5 mg Oral Daily  . DISCONTD: metoprolol tartrate  12.5 mg Oral BID   Continuous Infusions:  PRN Meds:.acetaminophen, acetaminophen, hydrALAZINE Assessment/Plan:  AMS: Patient claimed that he fell asleep the day of his stroke symptoms (08/20/11) and denies passing out. His mental status was rather altered from that episode through his time of admission so the details of the event and thereafter remain unknown as he was alone in his house for multiple days PTA. Differential diagnosis includes seizure episode, syncope including vasovagal syncope, cardiac event, hypoglycemia, and neurologic event. EEG has been performed on 08/25/11 and  results are pending. - continue cardiac monitor  - continue seizure precaution  Stroke - Patient has numerous risk factors for stroke (HTN, excessive etoh, afib). Last seen normal on Monday (08/18/11). Became confused 2 days later and incontinent of bowel and bladder function. Incontinence likely to be due to inability to ambulate from the floor in setting of his AMS and right leg weakness/numbness at onset of symptoms. Seizure is possibility and EEG interpretation pending. CT head shows density in the medial left frontal lobe suggestive of an infarct.  Could be subacute (defined as >24 hours to 6 weeks). MRI shows acute infarcts mainly left ACA territory (see read). Differential includes ischemic embolic event 2/2 afib (CHADS2 score =2, HF/HTN) vs. Atheroemboli (less likely as carotid dopplers were negative, see afib below).  - Appreciate neurology consult in the management of this patient  - Appreciate PT/OT and SLP assistance in the management of this patient  - d/c aspirin - begin xarelto 20 mg daily for secondary prevention  Atrial fib - Unknown if new or old. Called [facility] on [address] and they do have patient in system but no information on patient as they converted to EMR 3-4 years ago and no data in computer. 2D echo reveals EF 25-30%, mild LVH, diffuse/severe hypokinesis and grade 2 diastolic dysfunction. Preliminary carotid dopplers reveal no significant stenosis and anterograde flow. He is not a good candidate currently for Coumadin given his compliance, fall risk, EtOH, and Pradaxa is a poor choice given his LFTs, however in the setting of his afib he is a patient that does need anticoagulation.  - patients seems amenable to EtOH cessation, still has large fall risk and no supervision at home  - Xarelto is a better choice in this patient considering his risks and LFTs  Hypertension - Patient's pressures on admission 142-169 (systolic) over 93-114 (diastolic). He has  a h/o of untreated HTN. Pressures were increased overnight (173/110). Pressures were allowed to be elevated (48 hours) on admission to maintain adequate CPP.  - received labetalol IV overnight x 1 and hydralazine IV x 1 dose for elevated pressures  - start carvedilol 12.5 mg BID - start lisinopril 5 mg daily   Rhabdomyolysis - His CK,tot = 3920 and CK,MB = 13.1 on admission. Follow up enzymes, CK,tot= 2039 and CK,MB =7.3. Given that he was trapped on the floor for multiple days he may have rhabdo 2/2 confinement in a fixed position in setting of stroke, or due to alcohol intoxication. His IVF were discontinued in the setting of his increased blood pressures.  - recheck CMP daily  - lipitor on hold in setting of rhabdo   Acute renal failure - BUN = 39. SCr = 1.56. Ratio =25 on admission. Improved further today (21 and 1.22). Differential includes rhabdomyolysis induced (increased myoglobin toxic to kidney) vs. BPH (patient complains of urgency, hesitancy, frequency, dribbling) vs. crush injury (patient lying on floor on back/side for days and has large bruise on lower right back). FeNa << 1.  - SCr is trending back to normal  - recheck BMP in am   Increased MCV - Hgb 16.3, MCV = 103.5 on admission. B12 = 479 (WNL). Folate RBC = 807 (high).  Alcohol abuse - Patient reports 6-8 beers daily, though could be more as he is a poor historian on admission. Will monitor him for s/s withdrawal. His ETOH level <11 on admit. His LFT's are abnormal with AST > (ALT x 2)  - CIWA protocol  - folic acid daily  - thiamine daily  - MV daily  - consult SW   Tobacco abuse - Patient has a h/o 0.5 PPD x 45 years. In setting of stroke, HTN, and acute nature of symptoms will hold off on nicotine replacement for now.  - smoking cessation consult  Disposition - His BP meds have been started, he is started on xarelto today and he refuses SNF placement. CSW will speak to patient today about possible H-H follow-up and if  patient is clear by  neurology and cardiology will consider d/c very soon.   LOS: 3 days   This is a Psychologist, occupational Note.  The care of the patient was discussed with Dr. Lorretta Harp and the assessment and plan formulated with their assistance.  Please see their attached note for official documentation of the daily encounter.  Brian Mclaughlin 08/26/2011, 9:12 AM      Resident Addendum to Medical Student Note   I have seen and examined the patient, and agree with the the medical student assessment and plan outlined above. Please see my brief note below for additional details.  OBJECTIVE: VS: Reviewed  Meds: Reviewed  Labs: Reviewed  Imaging: Reviewed   PE:  General: resting in bed, not in acute distress HEENT: PERRL, EOMI, no scleral icterus. No bruit and JVD. Cardiac: S1/S2, RRR, No murmurs, gallops or rubs Pulm: Good air movement bilaterally, Clear to auscultation bilaterally, No rales, wheezing, rhonchi or rubs. Abd: Soft,  nondistended, nontender, no rebound pain, no organomegaly, BS present. There is no CVA tenderness bilaterally. Ext: No rashes or edema, 2+DP/PT pulse bilaterally Skin: no rashes. No skin bruise. Neuro: alert and oriented X3, cranial nerves II-XII grossly intact, except for failure of conjugation of left eye and weaker palatal elevation on the left. Sensation to light touch intact. 1+ brachial reflex and very weak knee reflex bilaterally. Left ankle muscle strength is 2/5 on dorsal flexion (patient has chronic left ankle weakness for many years). Left leg muscle strength is 5/5.  Upper extremety muscle strength 5/5 bilaterally. right leg muscle strength is  5/5. Normal finger-to-nose. Negative Babinski's sign.   ASSESSMENT/ PLAN: # AMS:  Although patient claimed that he fell asleep on Wednesday and denies passing out, it is not completely clear about what happened in that day. Differential diagnosis includes seizure episode, syncope including vasovagal syncope,  cardiac event, hypoglycemia, and neurologic event.   -Pending EEG   -cardiac monitor -seizure precaution  # Stroke - patient's symptoms is most likely caused by ischemic stroke given the findings on CT scan and MRI. A. fib is an important etiology. Patient also has other risk factors including hypertension and alcohol abuse. Other etiologies include hypercoagulable status and paradoxical embolism. Regarding patient's incontinence, it is most likely caused by stroke. But other possibility needs to be considered too, such as spinal cord lesions, Cauda Equina syndrome and seizure. 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 Carodid  Doppler negative.    - Appreciate neurology consult in the management of this patient   - continue aspirin 325 mg daily for secondary prevention   - PT/OT  Atrial fib - etiology is not clear currently. The possible reasons include HTN, hyperthyroidism, COPD; ischemic heart disease, valvular disease and infection.  However it doesn't seem to be caused by cardiac ischemia given that the patient does not have any chest pain and EKG is negative for ischemia. Study showed that heparin is contraindicated in patient with A.Fib who presents with acute stroke. It is recommended to start Coumadin after 24-48 hours to decrease morbidity and mortality.  Generally, if the Atrial fibrillation happened within 48 hours, cardioversion can be performed. However, the exact onset time of his atrial Fibrilation is not clear. Patient does not have RVR currently. HTS is 1.869. His 2D Echo showed EF 25 to 30% and no thrombus and has has grade 2 diastolic dysfunction.   - appreciate cariology help in magaging our patient. will follow up for recommendations. - will continue metoprolol at 12.5 mg bid  for both low EF and for his HTN. - will add xarelto for secondary stroke prevention given atrial fibrillation and stop aspirin per neurology. Patient is heavy drinker and has high risk for fall. He is not  good candidate for coumadin.  Hypertension - bp is 171/108   -Will continue Metoprolol today at 12.5 mg bid.   Rhabdomyolysis - improving. His total CK is trending down from 3920 to 2039 yesterday.  Given that he was trapped on the floor for multiple days he may have rhabdo 2/2 confinement in a fixed position in setting of stroke, or due to alcohol intoxication. Other causes can be arterial thrombus 2/2 afib, or extreme changes in body temp--hypo/hyperthermia (though much less likely).  - trend total CK. - lipitor on hold in setting of rhabdo  Acute renal failure - improving . Today his Cre trending from 1.56 to 1.21. Most likely caused by rhabod, other DD vs. BPH (patient complains of urgency, hesitancy, frequency, dribbling).   Differential includes rhabdomyolysis induced (increased myoglobin toxic to kidney) vs. BPH (patient complains of urgency, hesitancy, frequency, dribbling) vs. crush injury (patient lying on floor on back/side for days and has large bruise on lower right back). FeNa << 1.  - follow up with BMP  Alcohol abuse - Patient reports 6-8 beers daily, though could be more as he is a poor historian on admission. Will monitor him for s/s withdrawal. His ETOH level <11 on admit. His LFT's are abnormal with AST > (ALT x 2) - CIWA protocol - folic acid and thiamine - consulted SW  Tobacco abuse - Patient has a h/o 1/2 PPD x 45 years. In setting of stroke, HTN, and acute nature of symptoms will hold off on nicotine replacement for now.  - smoking cessation consulted  Deposition: patient has been living alone with a male roommate (girl friend?) who does not live in the his home every day. Given his current healthy condition, temporary rehab facility placement is the best choice for him. Patient wants to go home. We talked to him in length and explained the importance for rehab facility placement. Today he still wants to go home. CSW will speak to patient today about possible H-H  follow-up and if patient is clear by neurology and cardiology will consider d/c very soon.      Length of Stay: 3   Lorretta Harp, MD PGY1, Internal Medicine Teaching Service Pager: [phone]   08/26/2011, 11:14 AM

## 2011-08-26 NOTE — Clinical Social Work Placement (Addendum)
    Clinical Social Work Department CLINICAL SOCIAL WORK PLACEMENT NOTE 08/26/2011  Patient:  Brian Mclaughlin, Brian Mclaughlin  Account Number:  [id number] Admit date:  08/23/2011  Clinical Social Worker:  Unk Lightning, LCSW  Date/time:  08/26/2011 02:00 PM  Clinical Social Work is seeking post-discharge placement for this patient at the following level of care:   SKILLED NURSING   (*CSW will update this form in Epic as items are completed)   08/26/2011  Patient/family provided with [facility] Department of Clinical Social Work's list of facilities offering this level of care within the geographic area requested by the patient (or if unable, by the patient's family).  08/26/2011  Patient/family informed of their freedom to choose among providers that offer the needed level of care, that participate in Medicare, Medicaid or managed care program needed by the patient, have an available bed and are willing to accept the patient.  08/26/2011  Patient/family informed of [facility]' ownership interest in [facility], as well as of the fact that they are under no obligation to receive care at this facility.  PASARR submitted to EDS on 08/26/2011 PASARR number received from EDS on   FL2 transmitted to all facilities in geographic area requested by pt/family on  08/26/2011 FL2 transmitted to all facilities within larger geographic area on   Patient informed that his/her managed care company has contracts with or will negotiate with  certain facilities, including the following:     Patient/family informed of bed offers received:  08/27/11 Patient chooses bed at:  [facility] recommends and patient chooses bed at    Patient to be transferred to  on   Patient to be transferred to facility by   The following physician request were entered in Epic:   Additional Comments:  Jodean Lima, [phone]

## 2011-08-26 NOTE — Progress Notes (Signed)
Clinical Social Work  CSW reviewed PT/OT evaluations which stated that patient needed 24 hour care if he went home. CSW spoke with patient regarding living arrangements prior to admission. Patient reports that he lives with his girlfriend but she works during the day. Patient reports that she works but does not have a set schedule. For example, patient reports that she is working a double Advertising account executive and will not be home for several hours. CSW and patient spoke about patient's safety and his need for SNF if he is unable to get 24 hour care. Patient reports that he does not have any friends or family that would be available to assist him. CSW spoke with patient regarding SNF process and provided patient with SNF list. CSW explained the process of faxing out to determine which facilities would accept patient's insurance. Patient is agreeable to CSW faxing out but reports he wants to speak with girlfriend before making any decisions. Patient is aware of copays for insurance. Patient agreeable to CSW following up on 08/27/11 to discuss dc plans. CSW completed FL2 and faxed out to [facility] CSW submitted pasarr.  [place], [place] [phone] (Coverage for Bonnye Fava)

## 2011-08-26 NOTE — Procedures (Signed)
REFERRING PHYSICIAN:  Dr. Katrinka Blazing.  HISTORY:  A 66 year old male with altered mental status.  MEDICATIONS:  Aspirin, Folvite, vitamins, Ativan, Lopressor.  CONDITIONS OF RECORDING:  This is a 16 channel EEG carried out with the patient in the awake and drowsy states.  DESCRIPTION:  The waking background activity consists of a low-voltage, symmetrical, fairly well-organized 10 Hz alpha activity seen from the parieto-occipital and posterotemporal regions.  Low-voltage, fast activity, poorly organized was seen anteriorly and at times superimposed on more posterior rhythms.  A mixture of theta and alpha was seen from the central and temporal regions.  The patient drowses with slowing to irregular, low-voltage theta and beta activity.  Stage II sleep is not obtained.  Hyperventilation was not performed.  Intermittent photic stimulation failed to elicit any abnormalities.  IMPRESSION:  This is a normal EEG.  No epileptiform activity was noted.          ______________________________ Thana Farr, MD    [id number] D:  08/26/2011 09:17:56  T:  08/26/2011 11:35:31  Job #:  [id number]

## 2011-08-26 NOTE — Progress Notes (Signed)
Stroke Team Progress Note  HISTORY Brian Mclaughlin is an 66 y.o. male who was found by his girlfriend on 08/22/11 in the floor holding a broom in his own stool and urine. The girlfriend helped him to bed but found him in his stool and urine again today. Patient was felt to have right sided weakness. He has been numb on the right for 4 days. Patient is amnestic of these events. After being found again today the patient was brought in for evaluation. He feels that he has improved somewhat but continues to have right sided complaints. Patient was not a tPA candidate secondary to presenting beyond the window. He was admitted for further workup.  SUBJECTIVE Wife at bedside.he has no new complaints and wants to go home  OBJECTIVE Most recent Vital Signs: Filed Vitals:   08/25/11 2200 08/26/11 0200 08/26/11 0600 08/26/11 0818  BP: 147/98 173/110 170/108 132/89  Pulse: 68 72 73 82  Temp: 98.4 F (36.9 C) 97.9 F (36.6 C) 98.4 F (36.9 C)   TempSrc:      Resp: 18 18 18    Height:      Weight:      SpO2: 99% 97% 97%    CBG (last 3)  No results found for this basename: GLUCAP:3 in the last 72 hours Intake/Output from previous day: 06/10 0701 - 06/11 0700 In: 120 [P.O.:120] Out: -     MEDICATIONS     . aspirin  300 mg Rectal Daily   Or  . aspirin  325 mg Oral Daily  . carvedilol  6.25 mg Oral BID WC  . folic acid  1 mg Oral Daily  . hydrALAZINE  5 mg Intravenous Once  . labetalol  5 mg Intravenous Once  . lisinopril  2.5 mg Oral Daily  . LORazepam  0-4 mg Intravenous Q6H   Followed by  . LORazepam  0-4 mg Intravenous Q12H  . multivitamin with minerals  1 tablet Oral Daily  . thiamine  100 mg Oral Daily  . DISCONTD: metoprolol tartrate  12.5 mg Oral BID   PRN:  acetaminophen, acetaminophen  Diet:  Cardiac thin liquids Activity:   Up with assistance DVT Prophylaxis:  SCD  CLINICALLY SIGNIFICANT STUDIES Basic Metabolic Panel:   Lab 08/26/11 0500 08/25/11 1122  NA 139 139  K 3.7 3.9   CL 104 105  CO2 25 26  GLUCOSE 89 106*  BUN 21 20  CREATININE 1.22 1.22  CALCIUM 8.8 8.9  MG -- --  PHOS -- --   Liver Function Tests:   Lab 08/23/11 1202  AST 152*  ALT 48  ALKPHOS 92  BILITOT 2.0*  PROT 7.5  ALBUMIN 3.8   CBC:   Lab 08/23/11 1222 08/23/11 1202  WBC -- 10.0  NEUTROABS -- 7.0  HGB 16.3 15.5  HCT 48.0 44.6  MCV -- 103.5*  PLT -- 127*   Coagulation:   Lab 08/23/11 1202  LABPROT 13.7  INR 1.03   Cardiac Enzymes:   Lab 08/24/11 0540 08/23/11 1932 08/23/11 1210  CKTOTAL 2039* 2703* 3920*  CKMB 7.3* 9.1* 13.1*  CKMBINDEX -- -- --  TROPONINI <0.30 <0.30 <0.30   Urinalysis:   Lab 08/23/11 1620  COLORURINE AMBER*  LABSPEC 1.033*  PHURINE 5.5  GLUCOSEU NEGATIVE  HGBUR TRACE*  BILIRUBINUR MODERATE*  KETONESUR 40*  PROTEINUR 30*  UROBILINOGEN 1.0  NITRITE NEGATIVE  LEUKOCYTESUR SMALL*   Lipid Panel    Component Value Date/Time   CHOL 147 08/24/2011 0540  TRIG 64 08/24/2011 0540   HDL 59 08/24/2011 0540   CHOLHDL 2.5 08/24/2011 0540   VLDL 13 08/24/2011 0540   LDLCALC 75 08/24/2011 0540   HgbA1C  Lab Results  Component Value Date   HGBA1C 5.3 08/23/2011    Urine Drug Screen:     Component Value Date/Time   LABOPIA NONE DETECTED 08/23/2011 1620   COCAINSCRNUR NONE DETECTED 08/23/2011 1620   LABBENZ NONE DETECTED 08/23/2011 1620   AMPHETMU NONE DETECTED 08/23/2011 1620   THCU NONE DETECTED 08/23/2011 1620   LABBARB NONE DETECTED 08/23/2011 1620    Alcohol Level:   Lab 08/23/11 1202  ETH <11   CT of the brain Low density in the medial left frontal lobe is suggestive for an infarct. Age of the infarct is uncertain but could be subacute. No evidence for acute hemorrhage. 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  Atrophy and small vessel disease.  MRA of the brain  08/23/2011   Suspected 50% stenosis right ICA cavernous segment.  No ACA occlusion is observed, but proximal 50% stenosis  A2 segment left ACA could contribute to the observed pattern of infarction.   2D Echocardiogram  EF 25-30% with no source of embolus.  Carotid Doppler  No internal carotid artery stenosis bilaterally. Vertebrals with antegrade flow bilaterally.  CXR  No active disease. Mild degenerative changes thoracic spine.  EKG  atrial fibrillation, rate 78.   Therapy Recommendations PT -hh, OT -HH  Physical Exam  Middle aged Caucasian male currently not in distress. Awake alert. Afebrile. Head is nontraumatic. Neck is supple without bruit. Hearing is normal. Cardiac exam no murmur or gallop. Lungs are clear to auscultation. Distal pulses are well felt.  Neurological exam   Awake  Alert oriented x 3. Normal speech and language.eye movements full without nystagmus.normal acuity and visual fields. Fundi were not visualized. Face symmetric. Tongue midline. Normal strength, tone, reflexes and coordination. Normal sensation. Gait deferred.   ASSESSMENT Brian Mclaughlin is a 66 y.o. 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 Infarct secondary to atrial fibrillation but given his presentation may have had a seizure at onset with Todds Paralysis.  On no known medications  prior to admission. Now on xarelto for secondary stroke prevention. Patient with no resultant symptoms.  -hypertension -alcohol use -tobacco use -elevated LFTs, therefore xarelto. -cardiomyopathy, unknown source, may be related to polysubstance abuse.  Hospital day # 3  TREATMENT/PLAN -EEG shows no epileptiform activity. -TEE not indicated as patient is already on xarelto. -Xarelto for secondary stroke prevention. -Stroke Service will sign off. Follow up with Dr. Pearlean Brownie in 2 months.    Guy Franco, PAC, MBA,  MHA [facility] Pager: [phone] 08/26/2011 2:36 PM  Scribe for Dr. Delia Heady, Stroke Center Medical Director. He has personally reviewed chart, pertinent data, examined the patient and developed the plan of care. Pager:  [phone]

## 2011-08-26 NOTE — Progress Notes (Signed)
@  0200am Patient's BP was 173/110, asymptomatic.Dr. Milbert Coulter was notified and ordered to give labetalol 5mg  IV x 1 dose. @0500am  BP was rechecked and was 170/108. Pt. Denies any pain or discomfort. Dr. Milbert Coulter was informed and ordered hydralazine IV 5mg  x1dose. Will continue to monitor.

## 2011-08-26 NOTE — Progress Notes (Signed)
Internal Medicine Attending  Date: 08/26/2011  Patient name: Brian Mclaughlin Medical record number: [id number] Date of birth: [birth year] Age: 66 y.o. Gender: male  I saw and evaluated the patient. I reviewed the resident's note by Dr. Clyde Lundborg and I agree with the resident's findings and plans as documented in his note, with the following additional comments.  Based upon the documentation by physical and occupational therapy of the patient's current functional status and need for 24-hour assistance, I do not see how we can safely discharge him home.  I talked at length with him today and strongly encouraged him to consider at least short-term rehabilitation for OT/PT as he recovers from his stroke.  He indicated that he would consider this.

## 2011-08-26 NOTE — Progress Notes (Signed)
PT Cancellation Note  Treatment cancelled today due to pt on commode and requesting to "sit for a while".  Will try another time.    Sunny Schlein, [place] [phone] 08/26/2011, 2:52 PM

## 2011-08-27 LAB — URINALYSIS, ROUTINE W REFLEX MICROSCOPIC
Nitrite: NEGATIVE
Protein, ur: 30 mg/dL — AB
Specific Gravity, Urine: 1.017 (ref 1.005–1.030)
Urobilinogen, UA: 1 mg/dL (ref 0.0–1.0)

## 2011-08-27 LAB — CBC
HCT: 42.5 % (ref 39.0–52.0)
MCH: 36.4 pg — ABNORMAL HIGH (ref 26.0–34.0)
MCHC: 35.5 g/dL (ref 30.0–36.0)
MCV: 102.4 fL — ABNORMAL HIGH (ref 78.0–100.0)
Platelets: 104 10*3/uL — ABNORMAL LOW (ref 150–400)
RDW: 12.4 % (ref 11.5–15.5)
WBC: 10.5 10*3/uL (ref 4.0–10.5)

## 2011-08-27 LAB — BASIC METABOLIC PANEL
BUN: 24 mg/dL — ABNORMAL HIGH (ref 6–23)
BUN: 24 mg/dL — ABNORMAL HIGH (ref 6–23)
Calcium: 9.4 mg/dL (ref 8.4–10.5)
Calcium: 9.6 mg/dL (ref 8.4–10.5)
Chloride: 95 mEq/L — ABNORMAL LOW (ref 96–112)
Creatinine, Ser: 1.65 mg/dL — ABNORMAL HIGH (ref 0.50–1.35)
Creatinine, Ser: 1.45 mg/dL — ABNORMAL HIGH (ref 0.50–1.35)
GFR calc Af Amer: 48 mL/min — ABNORMAL LOW (ref >90)
GFR calc Af Amer: 56 mL/min — ABNORMAL LOW (ref >90)
GFR calc non Af Amer: 42 mL/min — ABNORMAL LOW (ref >90)
GFR calc non Af Amer: 49 mL/min — ABNORMAL LOW (ref >90)

## 2011-08-27 LAB — CK: Total CK: 248 U/L — ABNORMAL HIGH (ref 7–232)

## 2011-08-27 LAB — URINE MICROSCOPIC-ADD ON

## 2011-08-27 MED ORDER — CARVEDILOL 25 MG PO TABS
25.0000 mg | ORAL_TABLET | Freq: Two times a day (BID) | ORAL | Status: DC
  Administered 2011-08-27 – 2011-08-28 (×2): 25 mg via ORAL
  Filled 2011-08-27 (×4): qty 1

## 2011-08-27 MED ORDER — AMLODIPINE BESYLATE 10 MG PO TABS
10.0000 mg | ORAL_TABLET | Freq: Every day | ORAL | Status: DC
  Filled 2011-08-27: qty 1

## 2011-08-27 MED ORDER — AMLODIPINE BESYLATE 5 MG PO TABS
5.0000 mg | ORAL_TABLET | Freq: Every day | ORAL | Status: DC
  Administered 2011-08-27: 5 mg via ORAL
  Filled 2011-08-27: qty 1

## 2011-08-27 MED ORDER — SODIUM CHLORIDE 0.9 % IV SOLN
INTRAVENOUS | Status: AC
  Administered 2011-08-27: 23:00:00 via INTRAVENOUS

## 2011-08-27 NOTE — Progress Notes (Addendum)
Physical Therapy Treatment Patient Details Name: Brian Mclaughlin MRN: [id number] DOB: Aug 05, 1945 Today's Date: 08/27/2011 Time: 7829-5621 PT Time Calculation (min): 29 min  PT Assessment / Plan / Recommendation Comments on Treatment Session  Pt able to perform stair negotiation however needs cues for safety.  Pt educated on balance exercises for home and given handout.  Including static stance eyes open, stance stance eyes closed, rhomberg eyes closed and rhomberg eyes open.      Follow Up Recommendations  Home health PT;Supervision/Assistance - 24 hour    Barriers to Discharge        Equipment Recommendations  Tub/shower seat; Rolling Walker   Recommendations for Other Services    Frequency Min 4X/week   Plan Discharge plan remains appropriate;Frequency remains appropriate    Precautions / Restrictions Precautions Precautions: Fall   Pertinent Vitals/Pain No c/o pain    Mobility  Transfers Transfers: Sit to Stand;Stand to Sit Sit to Stand: 5: Supervision;From chair/3-in-1 Stand to Sit: 5: Supervision;To chair/3-in-1 Details for Transfer Assistance: Supervision for safety with cues for hand placements Ambulation/Gait Ambulation/Gait Assistance: 4: Min guard Ambulation Distance (Feet): 200 Feet Assistive device: Rolling walker Ambulation/Gait Assistance Details: Minguard for safety.  Continues to need cues to keep RW on floor during ambulation especially with turns. Gait Pattern: Step-through pattern;Decreased stride length;Narrow base of support Stairs: Yes Stairs Assistance: 4: Min guard Stairs Assistance Details (indicate cue type and reason): VCs for safety, UE placement and LE placement with appropriate step sequence. Stair Management Technique: One rail Left;Sideways Number of Stairs: 11  Wheelchair Mobility Wheelchair Mobility: No Modified Rankin (Stroke Patients Only) Pre-Morbid Rankin Score: Slight disability Modified Rankin: Moderately severe disability      Exercises     PT Diagnosis:    PT Problem List:   PT Treatment Interventions:     PT Goals Acute Rehab PT Goals PT Goal Formulation: With patient Time For Goal Achievement: 09/07/11 Potential to Achieve Goals: Good Pt will go Sit to Stand: with modified independence PT Goal: Sit to Stand - Progress: Progressing toward goal Pt will go Stand to Sit: with modified independence PT Goal: Stand to Sit - Progress: Progressing toward goal Pt will Ambulate: >150 feet;with modified independence;with least restrictive assistive device PT Goal: Ambulate - Progress: Progressing toward goal Pt will Go Up / Down Stairs: Flight;with modified independence;with least restrictive assistive device PT Goal: Up/Down Stairs - Progress: Progressing toward goal  Visit Information  Last PT Received On: 08/27/11 Assistance Needed: +2    Subjective Data      Cognition  Overall Cognitive Status: Impaired Area of Impairment: Attention;Safety/judgement;Awareness of errors;Awareness of deficits;Problem solving Arousal/Alertness: Awake/alert Orientation Level: Appears intact for tasks assessed Behavior During Session: Flat affect Current Attention Level: Sustained Safety/Judgement: Decreased safety judgement for tasks assessed;Decreased awareness of need for assistance;Impulsive Awareness of Errors: Assistance required to identify errors made    Balance  Balance Balance Assessed: Yes Static Standing Balance Static Standing - Balance Support: Bilateral upper extremity supported Static Standing - Level of Assistance: 5: Stand by assistance Single Leg Stance - Right Leg: 10  Single Leg Stance - Left Leg: 5  Rhomberg - Eyes Opened: 15  Rhomberg - Eyes Closed: 15   End of Session PT - End of Session Equipment Utilized During Treatment: Gait belt Activity Tolerance: Patient tolerated treatment well Patient left: in chair;with call bell/phone within reach;with family/visitor present Nurse Communication:  Mobility status    Stephone Gum 08/27/2011, 10:28 AM Jake Shark, PT DPT [phone]

## 2011-08-27 NOTE — Progress Notes (Addendum)
bp 168/110-administered 10 mg hydralazine per order.  Will recheck pressure and continue to monitor.

## 2011-08-27 NOTE — Discharge Summary (Signed)
Patient Name:  Brian Mclaughlin  MRN: [id number]  PCP: No primary provider on file.  DOB:  09-08-1945       Date of Admission:  08/23/2011  Date of Discharge:  09/01/2011      Attending Physician: Dr. Farley Ly, MD         DISCHARGE DIAGNOSES:  1. Stroke: MRI showed acute left frontal parasagittal and posterior frontal parasagittal infarctions without hemorrhage. At discharge, patient had minimal motor deficit on the right leg. 2.  AMS: likely secondary to stroke. EEG was negative for seizure. 3. A. Fib: No RVR. Discharged on Xarelto and Coreg. 4. HTN: Well controled with Coreg 6.25 mg bid.  5.  Rhabdomyolysis: Total CK trended down from 3920 to normal at discharge. 6. Acute renal failure:  Creatinine was 1.56 on admission; at discharge, creatinine was1.62. CT scan showed no hydronephrosis and no stone. 7. Alcohol abuse:  8. Elevated AST: likely due to alcohol abuse. AST was 152 and ALT was 48, AST > (ALT x 2).  9. Tobacco abuse:  DISPOSITION AND FOLLOW-UP: Brian Mclaughlin is to follow-up with the listed providers as detailed below, at patient's visiting, please address following issues:  Patient needs Tub/shower seat; Rolling Walker per PT/OT recommendation.  1. Please check BMP in 7 to 10 days to make sure his Creatine is trending down. 2. Please repeat CMP to trend his AST, ALT in 2 months 3. Please check his blood pressure, and adjust his medications accordingly.   Follow-up Information    Follow up with Brian Mage, MD. Schedule an appointment as soon as possible for a visit on 09/08/2011. (11:15 am--[facility])    Contact information:   [address] [place] [address] [phone]       Follow up with Hillis Range, MD. (Your cardiologist will call you with an appointment)    Contact information:   [address], [address] [place] [address] [phone]       Follow up with Brian Rigg, MD. (Your neurologist  will call you with an appointment)    Contact information:   [address], [address] [facility] [place] [address] [phone]         Discharge Orders    Future Appointments: Provider: Department: Dept Phone: Center:   09/08/2011 11:15 AM Brian Mage, MD [facility] [phone] [facility]     DISCHARGE MEDICATIONS: Medication List  As of 09/01/2011 11:09 AM   TAKE these medications         carvedilol 6.25 MG tablet   Commonly known as: COREG   Take 1 tablet (6.25 mg total) by mouth 2 (two) times daily with a meal.      folic acid 1 MG tablet   Commonly known as: FOLVITE   Take 1 tablet (1 mg total) by mouth daily.      multivitamin with minerals Tabs   Take 1 tablet by mouth daily.      Rivaroxaban 20 MG Tabs   Take 20 mg by mouth daily.      thiamine 100 MG tablet   Take 1 tablet (100 mg total) by mouth daily.             CONSULTS:     Neurology [facility]  PROCEDURES PERFORMED:  Ct Head Wo Contrast  08/23/2011  *RADIOLOGY REPORT*  Clinical Data: Numbness on the right side for 4 days.  The patient is confused.  CT HEAD  WITHOUT CONTRAST  Technique:  Contiguous axial images were obtained from the base of the skull through the vertex without contrast.  Comparison: None.  Findings: There is diffuse low density in the periventricular and subcortical white matter suggesting chronic changes.  There is a focal low density in the medial left frontal lobe which could represent edema and an infarct.  Age of this infarct is indeterminate but it could be subacute.  Small lacunar infarcts along the right white matter tracts.  The patient has mild cerebral atrophy.  No evidence for acute hemorrhage, mass lesion, midline shift or hydrocephalus.  Motion artifact near the skull base.  No acute bony abnormality.  IMPRESSION: Low density in the medial left frontal lobe is suggestive for an infarct.  Age of the infarct is uncertain but could be subacute.  No  evidence for acute hemorrhage.  Atrophy and evidence of chronic small vessel ischemic changes.  Original Report Authenticated By: Richarda Overlie, M.D.   Mr [name] Head Wo Contrast  08/23/2011  *RADIOLOGY REPORT*  Clinical Data:  Right-sided weakness.  Alcoholism.  MRI HEAD WITHOUT CONTRAST MRA HEAD WITHOUT CONTRAST  Technique:  Multiplanar, multiecho pulse sequences of the brain and surrounding structures were obtained without intravenous contrast. Angiographic images of the head were obtained using MRA technique without contrast.  Comparison:  CT head earlier today.  MRI HEAD  Findings:  Acute left frontal parasagittal, posterior frontal parasagittal, and left parietal parasagittal infarction without hemorrhage.  This corresponds to the CT abnormality.  Some of the more posterior areas of subcentimeter restricted diffusion are potentially in the left PCA territory, but the predominant area of involvement falls within the left ACA territory.  There is atrophy with chronic microvascular ischemic change. Scattered remote lacunes are noted in the basal ganglia and periventricular white matter.  Patent intracranial vasculature. Normal pituitary and cerebellar tonsils.  Mild chronic sinus disease.  There are no foci of chronic hemorrhage. Slight left mastoid fluid.  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  Atrophy and small vessel disease.  MRA HEAD  Findings: Left internal carotid artery widely patent and slightly larger than the right.  Suspected 50% stenosis cavernous right ICA. Mild nonstenotic irregularity right ICA junction with right middle cerebral artery.  A1 segment right anterior cerebral artery atretic.  Both anterior cerebral arteries fill from the left.  No MCA stenosis.  Basilar artery widely patent with vertebrals codominant.  No PCA narrowing.  The A2 and A3 segments of the right anterior cerebral artery appear  widely patent.  There is focal estimated 50% narrowing of the A2 segment left anterior cerebral artery which could contribute to the patient's pattern of infarction.  This narrowing is not necessarily flow reducing.  No proximal occlusion of the left ACA is observed. No cerebellar branch occlusion.  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  No ACA occlusion is observed, but proximal 50% stenosis  A2 segment left ACA could contribute to the observed pattern of infarction.  Original Report Authenticated By: Elsie Stain, M.D.   Mr Brain Wo Contrast  08/23/2011  *RADIOLOGY REPORT*  Clinical Data:  Right-sided weakness.  Alcoholism.  MRI HEAD WITHOUT CONTRAST MRA HEAD WITHOUT CONTRAST  Technique:  Multiplanar, multiecho pulse sequences of the brain and surrounding structures were obtained without intravenous contrast. Angiographic images of the head were obtained using MRA technique without contrast.  Comparison:  CT head earlier today.  MRI HEAD  Findings:  Acute  left frontal parasagittal, posterior frontal parasagittal, and left parietal parasagittal infarction without hemorrhage.  This corresponds to the CT abnormality.  Some of the more posterior areas of subcentimeter restricted diffusion are potentially in the left PCA territory, but the predominant area of involvement falls within the left ACA territory.  There is atrophy with chronic microvascular ischemic change. Scattered remote lacunes are noted in the basal ganglia and periventricular white matter.  Patent intracranial vasculature. Normal pituitary and cerebellar tonsils.  Mild chronic sinus disease.  There are no foci of chronic hemorrhage. Slight left mastoid fluid.  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  Atrophy and small vessel disease.  MRA HEAD  Findings: Left internal carotid artery widely patent and slightly larger than the  right.  Suspected 50% stenosis cavernous right ICA. Mild nonstenotic irregularity right ICA junction with right middle cerebral artery.  A1 segment right anterior cerebral artery atretic.  Both anterior cerebral arteries fill from the left.  No MCA stenosis.  Basilar artery widely patent with vertebrals codominant.  No PCA narrowing.  The A2 and A3 segments of the right anterior cerebral artery appear widely patent.  There is focal estimated 50% narrowing of the A2 segment left anterior cerebral artery which could contribute to the patient's pattern of infarction.  This narrowing is not necessarily flow reducing.  No proximal occlusion of the left ACA is observed. No cerebellar branch occlusion.  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  No ACA occlusion is observed, but proximal 50% stenosis  A2 segment left ACA could contribute to the observed pattern of infarction.  Original Report Authenticated By: Elsie Stain, M.D.   Dg Chest Portable 1 View  08/23/2011  *RADIOLOGY REPORT*  Clinical Data: Altered mental status  PORTABLE CHEST - 1 VIEW  Comparison: None.  Findings: Cardiomediastinal silhouette is unremarkable.  No acute infiltrate or pleural effusion.  No pulmonary edema.  Mild degenerative changes thoracic spine.  IMPRESSION: No active disease.  Mild degenerative changes thoracic spine.  Original Report Authenticated By: Natasha Mead, M.D.       ADMISSION DATA: H&P: Patient is 66 yo man with PMH of HTN, alcohol and tobacco abuse, who presents with AMS and right leg numbness and weakness. History was provided by patient and his girl friend.   Per his girlfriend, patient was normal on Monday before she left to go out of town. Patient states that he was watching TV for almost a whole day on Wednesday, then he fell asleep (patient remembers that  he fell asleep rather than passed out). He did not recall how long he had slept . He woke up with the right leg weakness and numbness. He  lost control of bladder and bowel movement during his sleeping. He denies any injury to his body. No nausea vomiting. His girlfriend communicated with patient on Wednesday by texting him and found his text message did not make sense. Since then patient was not able to communicate normally with his girlfriend.  His girl friend came back on Friday and found patient was on the floor in a supine position next to the couch with a broom in his hands. According to his girlfriend it seems as if he was contained within the living room given the radius of his incontinence as he apparently was crawling around the room. As he is a usual drinker and his g/f is used to him being "drunk" she did not think much of Friday  night and she helped him upstairs to bed and got him cleaned up. She said his mental status was typical of him for an evening of drinking and did not comment on his incontinence. When she awoke on Saturday morning, the day of admission, she again found signs upstairs that he had become incontinent of bowel and bladder. She found the patient downstairs on the floor next to the couch with his right arm draped on the couch as if he had attempted to get up off the floor but could not succeed. The patient was noted to be confused at this time and he was brought to the ED via ambulance.  The patient denied any dizziness, N/V/D, headaches, vision changes, or hearing changes.  Lab results: Basic Metabolic Panel:  Basename  08/23/11 1222  08/23/11 1202   NA  140  140   K  4.6  4.5   CL  111  103   CO2  --  23   GLUCOSE  91  91   BUN  38*  39*   CREATININE  1.50*  1.56*   CALCIUM  --  9.6   MG  --  --   PHOS  --  --    Liver Function Tests:  [facility]  08/23/11 1202   AST  152*   ALT  48   ALKPHOS  92   BILITOT  2.0*   PROT  7.5   ALBUMIN  3.8    No results found for this basename: LIPASE:2,AMYLASE:2 in the last 72 hours  Basename  08/23/11 1203   AMMONIA  16    CBC:  Basename  08/23/11  1222  08/23/11 1202   WBC  --  10.0   NEUTROABS  --  7.0   HGB  16.3  15.5   HCT  48.0  44.6   MCV  --  103.5*   PLT  --  127*    Cardiac Enzymes:  Basename  08/23/11 1210   CKTOTAL  3920*   CKMB  13.1*   CKMBINDEX  --   TROPONINI  <0.30     HOSPITAL COURSE:  # AMS:  Although patient claimed that he fell asleep on Wednesday and denied passing out, it was not completely clear about what happened that day. Differential diagnosis initially included seizure episode, syncope including vasovagal syncope, cardiac event, hypoglycemia, and neurologic event. Patient was monitored closely on telemetry. Troponin was negative. Patient's EEG was negative for seizure. He did not have any episodes of seizure in hospital. His altered mental status resolved, and he was oriented and discharged in stable condition.  # Stroke - patient's symptoms were most likely caused by ischemic stroke, given the findings on CT scan and MRI. A. fib was an important etiology. Patient also had other risk factors including hypertension and alcohol abuse. MRA showed 50% stenosis A2 of left ACA, and carodid  Doppler was negative. Patient has been a heavy drinker and has high risk for fall. However, given his likely embolic stroke, we felt that anticoagulation was warranted for stroke prophylaxis. We also felt Xarelto was a better choice than Coumadin since no monitoring is needed. Patient was initially treated with ASA and then switched to Xarelto on 6/11. He was discharged on Xarelto for secondary prevention. He is to follow up with neurology.   #. Atrial fibrillation- The exact onset time of his atrial Fibrilation was not clear. Patient did not have RVR. TSH was 1.869. His 2D Echo showed EF 25  to 30% and no thrombus and he had grade 2 diastolic dysfunction. Cardiology was consulted and suggested doing TEE, but per neurologist, Dr. Pearlean Brownie, it would not change the management even thrombus was found by TEE, so TEE was cancelled.  Patient has been a heavy drinker and has high risk for fall. As noted above, he was discharged on Xeralto for secondary prevention. He is to follow up with cardiology.  # Hypertension - patient was initially treated with coreg, lisinopril and amlodipine. Due to elevated Creatinine, lisinopril was discontinued. Then the doses of Coreg and amlodipine were increased to 10 mg daily and 25 mg bid. Patient then developed an episode of low blood pressure which caused worsening of left side weakness. Then, his blood pressure medications were adjusted again and he was only put back on Coreg which controlled his blood pressure well.  He was discharged on Coreg 6.25 mg bid which is also for his atrial fibrillation.  # Rhabdomyolysis -   Given that he was trapped on the floor for multiple days he may have had rhabdo secondary to confinement in a fixed position in setting of stroke, or due to alcohol intoxication. Patient was treated with IVF. He responded to the treatment well. His total CK trended down from 3920 to normal at discharge.   # Acute renal failure - His baseline creatinine was not clear. On admission, his creatinine was 1.56. Likely due to rhabdomyolysis and volume depletion, given his FeNa was << 1. His creatinine improved to normal on 6/11, but trended up from 1.21 to 1.45 on 6/12, likely due to lisinoprill. Lisinopril was discontinued and he was treated with IVF. CT abdomen and pelvis without contrast on 6/13 showed  asymmetric perinephric soft tissue stranding on the left, especially superiorly;  no renal or ureteral calculi were demonstrated; there wa no significant hydronephrosis; there was no evidence of bladder calculus;  no focal renal abnormalities was seen. His creatinine was still elevated at 1.65 at discharge, so there may be underlying chronic renal disease in the setting of uncontrolled hypertension. He needs to have a BMP in 7 to 10 days.  # Alcohol abuse - Patient reports drinking 6-8 beers  daily, though could be more as he is a poor historian on admission. His ETOH level was <11 on admit. His LFT's were abnormal with AST > (ALT x 2). Patient was treated with CIWA protocol in hospital. Social work was consulted. There was no issues in hospital.  # Tobacco abuse - Patient has a h/o 1/2 PPD x 45 years. In setting of stroke, HTN, and acute nature of symptoms will hold off on nicotine replacement due to stroke. Smoking cessation consulted.  DISCHARGE DATA: Vital Signs: BP 137/94  Pulse 74  Temp 98.1 F (36.7 C) (Oral)  Resp 18  Ht 6' (1.829 m)  Wt 214 lb 14.4 oz (97.478 kg)  BMI 29.15 kg/m2  SpO2 98%  Labs: No results found for this or any previous visit (from the past 24 hour(s)).  Time Spent on Discharge: 35 min  Signed: Lorretta Harp, MD PGY I, Internal Medicine Resident 09/01/2011, 11:09 AM

## 2011-08-27 NOTE — Discharge Instructions (Signed)
1. You have hospital follow up appointments as scheduled. Please keep these appointments. 2. Please take all medications as prescribed.  3. If you have worsening of your symptoms or new symptoms arise, please call the clinic ([phone]), or go to the ER immediately if symptoms are severe.  STROKE/TIA DISCHARGE INSTRUCTIONS SMOKING Cigarette smoking nearly doubles your risk of having a stroke & is the single most alterable risk factor  If you smoke or have smoked in the last 12 months, you are advised to quit smoking for your health.  Most of the excess cardiovascular risk related to smoking disappears within a year of stopping.  Ask you doctor about anti-smoking medications  [place] Quit Line: 1-800-QUIT NOW  Free Smoking Cessation Classes [phone]  CHOLESTEROL Know your levels; limit fat & cholesterol in your diet  Lipid Panel     Component Value Date/Time   CHOL 147 08/24/2011 0540   TRIG 64 08/24/2011 0540   HDL 59 08/24/2011 0540   CHOLHDL 2.5 08/24/2011 0540   VLDL 13 08/24/2011 0540   LDLCALC 75 08/24/2011 0540      Many patients benefit from treatment even if their cholesterol is at goal.  Goal: Total Cholesterol (CHOL) less than 160  Goal:  Triglycerides (TRIG) less than 150  Goal:  HDL greater than 40  Goal:  LDL (LDLCALC) less than 100   BLOOD PRESSURE American Stroke Association blood pressure target is less that 120/80 mm/Hg  Your discharge blood pressure is:  BP: 163/116 mmHg  Monitor your blood pressure  Limit your salt and alcohol intake  Many individuals will require more than one medication for high blood pressure  DIABETES (A1c is a blood sugar average for last 3 months) Goal HGBA1c is under 7% (HBGA1c is blood sugar average for last 3 months)  Diabetes: {STROKE DC DIABETES:22357}    Lab Results  Component Value Date   HGBA1C 5.3 08/23/2011     Your HGBA1c can be lowered with medications, healthy diet, and exercise.  Check your blood sugar as directed by your  physician  Call your physician if you experience unexplained or low blood sugars.  PHYSICAL ACTIVITY/REHABILITATION Goal is 30 minutes at least 4 days per week    {STROKE DC ACTIVITY/REHAB:22359}  Activity decreases your risk of heart attack and stroke and makes your heart stronger.  It helps control your weight and blood pressure; helps you relax and can improve your mood.  Participate in a regular exercise program.  Talk with your doctor about the best form of exercise for you (dancing, walking, swimming, cycling).  DIET/WEIGHT Goal is to maintain a healthy weight  Your discharge diet is: General *** liquids Your height is:  Height: 6' (182.9 cm) Your current weight is: Weight: 101.742 kg (224 lb 4.8 oz) Your Body Mass Index (BMI) is:  BMI (Calculated): 30.5   Following the type of diet specifically designed for you will help prevent another stroke.  Your goal weight range is:  ***  Your goal Body Mass Index (BMI) is 19-24.  Healthy food habits can help reduce 3 risk factors for stroke:  High cholesterol, hypertension, and excess weight.  RESOURCES Stroke/Support Group:  Call [phone]  they meet the 3rd Sunday of the month on the Rehab Unit at [facility], [place] ( no meetings June, July & Aug).  STROKE EDUCATION PROVIDED/REVIEWED AND GIVEN TO PATIENT Stroke warning signs and symptoms How to activate emergency medical system (call 911). Medications prescribed at discharge. Need for follow-up after  discharge. Personal risk factors for stroke. Pneumonia vaccine given:   {STROKE DC YES/NO/DATE:22363} Flu vaccine given:   {STROKE DC YES/NO/DATE:22363} My questions have been answered, the writing is legible, and I understand these instructions.  I will adhere to these goals & educational materials that have been provided to me after my discharge from the hospital.

## 2011-08-27 NOTE — Progress Notes (Addendum)
Internal Medicine Attending  Date: 08/27/2011  Patient name: Brian Mclaughlin Medical record number: [id number] Date of birth: [birth year] Age: 66 y.o. Gender: male  I saw and evaluated the patient on a.m. rounds with house staff. I reviewed the resident's note by Dr. Clyde Lundborg and I agree with the resident's findings and plans as documented in his note.

## 2011-08-27 NOTE — Progress Notes (Signed)
Medical Student Daily Progress Note  Subjective: Patient did not sleep too well last night. Around 10pm he noticed a sharp pain around his lower left back. This pain is just inferior to his left CVA and does not radiate anywhere. He describes the pain as continuous in nature as well and he has no history of kidney stones in the past. He denies any pain with urination, though he does have symptoms of BPH which is a chronic issue (hesitancy, urgency, dribbling). Otherwise he denies any other complaints. He has been NPO since midnight however, his TEE has been cancelled this am (course of therapy will not change with a positive finding).  Objective: Vital signs in last 24 hours: Filed Vitals:   08/26/11 2211 08/27/11 0300 08/27/11 0415 08/27/11 0634  BP: 159/94 168/110 161/100 159/104  Pulse: 68 71  61  Temp: 98.4 F (36.9 C) 97.3 F (36.3 C)  98.1 F (36.7 C)  TempSrc: Oral Oral  Oral  Resp: 16 16  16   Height:      Weight:      SpO2: 99% 99%  100%   Weight change:   Intake/Output Summary (Last 24 hours) at 08/27/11 0742 Last data filed at 08/26/11 1230  Gross per 24 hour  Intake    240 ml  Output      0 ml  Net    240 ml   Physical Exam: BP 159/104  Pulse 61  Temp 98.1 F (36.7 C) (Oral)  Resp 16  Ht 6' (1.829 m)  Wt 101.742 kg (224 lb 4.8 oz)  BMI 30.42 kg/m2  SpO2 100% General appearance: alert, cooperative and no distress Head: Normocephalic, without obvious abnormality, atraumatic Eyes: conjunctivae/corneas clear. PERRL, EOM's intact. Fundi benign. Back: L lower side tenderness Lungs: clear to auscultation bilaterally Chest wall: right sided costochondral tenderness Heart: irregularly irregular rhythm Abdomen: soft, non-tender; bowel sounds normal; no masses,  no organomegaly Male genitalia: normal Extremities: extremities normal, atraumatic, no cyanosis or edema Skin: ecchymoses - back (right lower) Neurologic: Mental status: Alert, oriented, thought content  appropriate Cranial nerves:  I: smell Not tested  II: visual acuity  OS:     OD:   II: visual fields Fails accomodation reflex (L eye deviates laterally)  II: pupils Equal, round, reactive to light  III,VII: ptosis None  III,IV,VI: extraocular muscles  Full ROM  V: mastication Normal  V: facial light touch sensation  Normal  V,VII: corneal reflex  Present  VII: facial muscle function - upper  Normal  VII: facial muscle function - lower Normal  VIII: hearing Not tested  IX: soft palate elevation  Normal  IX,X: gag reflex R deviation  XI: trapezius strength  5/5  XI: sternocleidomastoid strength 5/5  XI: neck flexion strength  5/5  XII: tongue strength  Normal   Sensory: normal Motor: grossly normal Reflexes: 1+ UE, absent LE Lab Results: Basic Metabolic Panel:  Lab 08/26/11 7829 08/25/11 1122  NA 139 139  K 3.7 3.9  CL 104 105  CO2 25 26  GLUCOSE 89 106*  BUN 21 20  CREATININE 1.22 1.22  CALCIUM 8.8 8.9  MG -- --  PHOS -- --   Liver Function Tests:  Lab 08/23/11 1202  AST 152*  ALT 48  ALKPHOS 92  BILITOT 2.0*  PROT 7.5  ALBUMIN 3.8   No results found for this basename: LIPASE:2,AMYLASE:2 in the last 168 hours  Lab 08/23/11 1203  AMMONIA 16   CBC:  Lab 08/23/11 1222 08/23/11  1202  WBC -- 10.0  NEUTROABS -- 7.0  HGB 16.3 15.5  HCT 48.0 44.6  MCV -- 103.5*  PLT -- 127*   Cardiac Enzymes:  Lab 08/24/11 0540 08/23/11 1932 08/23/11 1210  CKTOTAL 2039* 2703* 3920*  CKMB 7.3* 9.1* 13.1*  CKMBINDEX -- -- --  TROPONINI <0.30 <0.30 <0.30   BNP: No results found for this basename: PROBNP:3 in the last 168 hours D-Dimer: No results found for this basename: DDIMER:2 in the last 168 hours CBG: No results found for this basename: GLUCAP:6 in the last 168 hours Hemoglobin A1C:  Lab 08/23/11 1542  HGBA1C 5.3   Fasting Lipid Panel:  Lab 08/24/11 0540  CHOL 147  HDL 59  LDLCALC 75  TRIG 64  CHOLHDL 2.5  LDLDIRECT --   Thyroid Function  Tests:  Lab 08/23/11 1542  TSH 1.869  T4TOTAL --  FREET4 --  T3FREE --  THYROIDAB --   Coagulation:  Lab 08/23/11 1202  LABPROT 13.7  INR 1.03   Anemia Panel:  Lab 08/23/11 1542  VITAMINB12 479  FOLATE --  FERRITIN --  TIBC --  IRON --  RETICCTPCT --   Urine Drug Screen: Drugs of Abuse     Component Value Date/Time   LABOPIA NONE DETECTED 08/23/2011 1620   COCAINSCRNUR NONE DETECTED 08/23/2011 1620   LABBENZ NONE DETECTED 08/23/2011 1620   AMPHETMU NONE DETECTED 08/23/2011 1620   THCU NONE DETECTED 08/23/2011 1620   LABBARB NONE DETECTED 08/23/2011 1620    Alcohol Level:  Lab 08/23/11 1202  ETH <11   Urinalysis:  Lab 08/23/11 1620  COLORURINE AMBER*  LABSPEC 1.033*  PHURINE 5.5  GLUCOSEU NEGATIVE  HGBUR TRACE*  BILIRUBINUR MODERATE*  KETONESUR 40*  PROTEINUR 30*  UROBILINOGEN 1.0  NITRITE NEGATIVE  LEUKOCYTESUR SMALL*    Micro Results: No results found for this or any previous visit (from the past 240 hour(s)). Studies/Results: No results found. Medications: I have reviewed the patient's current medications. Scheduled Meds:   . carvedilol  12.5 mg Oral BID WC  . folic acid  1 mg Oral Daily  . lisinopril  5 mg Oral Daily  . LORazepam  0-4 mg Intravenous Q12H  . multivitamin with minerals  1 tablet Oral Daily  . pneumococcal 23 valent vaccine  0.5 mL Intramuscular Once  . rivaroxaban  20 mg Oral Daily  . thiamine  100 mg Oral Daily  . DISCONTD: aspirin  300 mg Rectal Daily  . DISCONTD: aspirin  325 mg Oral Daily  . DISCONTD: carvedilol  6.25 mg Oral BID WC  . DISCONTD: lisinopril  2.5 mg Oral Daily  . DISCONTD: pneumococcal 13-valent conjugate vaccine  0.5 mL Intramuscular Once   Continuous Infusions:  PRN Meds:.acetaminophen, acetaminophen, hydrALAZINE Assessment/Plan:  AMS: Patient claimed that he fell asleep the day of his stroke symptoms (08/20/11) and denies passing out. His mental status was rather altered from that episode through his time of  admission so the details of the event and thereafter remain unknown as he was alone in his house for multiple days PTA. Differential diagnosis includes seizure episode, syncope including vasovagal syncope, cardiac event, hypoglycemia, and neurologic event. EEG 08/25/11 normal (no epileptiform activity noted). - continue cardiac monitor   Stroke - Patient has numerous risk factors for stroke (HTN, excessive etoh, afib). Last seen normal on Monday (08/18/11). Became confused 2 days later and incontinent of bowel and bladder function. Incontinence likely to be due to inability to ambulate from the floor in setting of  his AMS and right leg weakness/numbness at onset of symptoms. CT head shows density in the medial left frontal lobe suggestive of an infarct. MRI shows acute infarcts mainly left ACA territory (see read). Differential includes ischemic embolic event 2/2 afib (CHADS2 score =2, HF/HTN) vs. Atheroemboli (less likely as carotid dopplers were negative, see afib below).  - Appreciate neurology consult in the management of this patient  - Appreciate PT/OT and SLP assistance in the management of this patient  - d/c aspirin  - begin xarelto 20 mg daily for secondary prevention  - TEE cancelled: regardless if a thrombus is revealed, management will not change in this patient as he is already started on xarelto  L CVA tenderness - Patient describes a sharp pain that began last night and is continuous in nature. It is not directly over the CVA however still concerning. He denies any history of kidney stones. - will check UA and microscopy - will check BMET - may consider [place]  Atrial fib - Unknown if new or old. Called [facility] on [address] and they do have patient in system but no information on patient as they converted to EMR 3-4 years ago and no data in computer. 2D echo reveals EF 25-30%, mild LVH, diffuse/severe hypokinesis and grade 2 diastolic dysfunction. Preliminary carotid  dopplers reveal no significant stenosis and anterograde flow. He is not a good candidate currently for Coumadin given his compliance, fall risk, EtOH, and Pradaxa is a poor choice given his LFTs, however in the setting of his afib he is a patient that does need anticoagulation.  - patients seems amenable to EtOH cessation, still has large fall risk and no supervision at home  - Xarelto is a better choice in this patient considering his risks and LFTs  - TEE cancelled (see stroke)  Hypertension - Patient's pressures on admission 142-169 (systolic) over 93-114 (diastolic). He has a h/o of untreated HTN. Pressures were increased overnight (159/104). Pressures were allowed to be elevated (48 hours) on admission to maintain adequate CPP.  - received hydralazine 10mg  IV x 1 dose for elevated pressures  - continue carvedilol 12.5 mg BID  - continue lisinopril 5 mg daily   Rhabdomyolysis - His CK,tot = 3920 and CK,MB = 13.1 on admission. Follow up enzymes, CK,tot= 2039 and CK,MB =7.3. Given that he was trapped on the floor for multiple days he may have rhabdo 2/2 confinement in a fixed position in setting of stroke, or due to alcohol intoxication. His IVF were discontinued in the setting of his increased blood pressures.  - recheck CMP daily  - lipitor on hold in setting of rhabdo   Acute renal failure - BUN = 39. SCr = 1.56. Ratio =25 on admission. Downward trend since admission. Differential includes rhabdomyolysis induced (increased myoglobin toxic to kidney) vs. BPH (patient complains of urgency, hesitancy, frequency, dribbling) vs. crush injury (patient lying on floor on back/side for days and has large bruise on lower right back). FeNa << 1.  - SCr is trending back to normal  - recheck BMP in am   Increased MCV - Hgb 16.3, MCV = 103.5 on admission. B12 = 479 (WNL). Folate RBC = 807 (high). Remaining cause likely due to his alcohol abuse, especially with a normal Hgb. Acetaldehyde (breakdown product  of ethanol) interferes with cell division and causes increased volume in RBC's leading to macrocytosis. - this would resolve upon cessation of alcohol abuse  Alcohol abuse - Patient reports 6-8 beers  daily, though could be more as he is a poor historian on admission. Will monitor him for s/s withdrawal. His ETOH level <11 on admit. His LFT's are abnormal with AST > (ALT x 2)  - CIWA protocol  - folic acid daily  - thiamine daily  - MV daily  - consult CSW   Tobacco abuse - Patient has a h/o 0.5 PPD x 45 years. In setting of stroke, HTN, and acute nature of symptoms will hold off on nicotine replacement for now.  - smoking cessation consult  Disposition - TEE cancelled. He is amenable to SNF now although he cannot afford $50/day. He continues to refuse SNF based on cost and he can also not afford Xarelto 2/2 cost as well. It has been made very clear to him that if he goes home he will most likely fall and sustain harm. Recommendations are for him to have 24 hour assistance and SNF placement for rehab and strength until he is deemed safe to return home. He also requires a rolling walker and a tub/shower seat.   LOS: 4 days   This is a Psychologist, occupational Note.  The care of the patient was discussed with Dr. Lorretta Harp and the assessment and plan formulated with their assistance.  Please see their attached note for official documentation of the daily encounter.  Lewie Chamber 08/27/2011, 7:42 AM     Resident Addendum to Medical Student Note   I have seen and examined the patient, and agree with the the medical student assessment and plan outlined above. Please see my brief note below for additional details.   OBJECTIVE: VS: Reviewed  Meds: Reviewed  Labs: Reviewed  Imaging: Reviewed  PE:  General: resting in bed, not in acute distress HEENT: PERRL, EOMI, no scleral icterus. No bruit and JVD. Cardiac: S1/S2, RRR, No murmurs, gallops or rubs Pulm: Good air movement bilaterally, Clear to  auscultation bilaterally, No rales, wheezing, rhonchi or rubs. Abd: Soft,  nondistended, nontender, no rebound pain, no organomegaly, BS present. There is CVA tenderness on the left. Ext: No rashes or edema, 2+DP/PT pulse bilaterally Skin: no rashes. No skin bruise. Neuro: alert and oriented X3, cranial nerves II-XII grossly intact, except for failure of conjugation of left eye and weaker palatal elevation on the left. Sensation to light touch intact. 1+ brachial reflex and very weak knee reflex bilaterally. Left ankle muscle strength is 2/5 on dorsal flexion (patient has chronic left ankle weakness for many years). Left leg muscle strength is 5/5.  Upper extremety muscle strength 5/5 bilaterally. right leg muscle strength is  5/5. Normal finger-to-nose. Negative Babinski's sign.  ASSESSMENT/ PLAN: # AMS:  Although patient claimed that he fell asleep on Wednesday and denies passing out, it is not completely clear about what happened in that day. Differential diagnosis includes seizure episode, syncope including vasovagal syncope, cardiac event, hypoglycemia, and neurologic event. EEG is negative for seizure.   - continue cardiac monitor - seizure precaution  # Stroke - patient's symptoms is most likely caused by ischemic stroke given the findings on CT scan and MRI. A. fib is an important etiology. Patient also has other risk factors including hypertension and alcohol abuse. Other etiologies include hypercoagulable status and paradoxical embolism. Regarding patient's incontinence, it is most likely caused by stroke. But other possibility needs to be considered too, such as spinal cord lesions, Cauda Equina syndrome and seizure. 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 Carodid  Doppler negative.   - Appreciate neurology  consult in the management of this patient. Per neurology, TEE is not necessary since the results will not change the management. - continue Xarelto for secondary prevention   -  PT/OT  Atrial fib - etiology is not clear currently. The possible reasons include HTN, hyperthyroidism, COPD; ischemic heart disease, valvular disease and infection.  However it doesn't seem to be caused by cardiac ischemia given that the patient does not have any chest pain and EKG is negative for ischemia. Study showed that heparin is contraindicated in patient with A.Fib who presents with acute stroke. It is recommended to start Coumadin after 24-48 hours to decrease morbidity and mortality.  Generally, if the Atrial fibrillation happened within 48 hours, cardioversion can be performed. However, the exact onset time of his atrial Fibrilation is not clear. Patient does not have RVR currently. HTS is 1.869. His 2D Echo showed EF 25 to 30% and no thrombus and has has grade 2 diastolic dysfunction.   - appreciate cariology help in magaging our patient. will follow up for recommendations. - will continue metoprolol at 12.5 mg bid for both low EF and for his HTN. - will continue xarelto for secondary stroke prevention given atrial fibrillation. Patient is heavy drinker and has high risk for fall. He is not good candidate for coumadin.  Hypertension - bp is 168/110.   -Will increase the dose of Metoprolol today from 12.5 to 25 mg bid. -will discontinue the lisinopril due to Cre is elevated. -will add Novasc   Rhabdomyolysis - improving.  His total CK is trending down from 3920 to 248 today.  Given that he was trapped on the floor for multiple days he may have rhabdo 2/2 confinement in a fixed position in setting of stroke, or due to alcohol intoxication. Other causes can be arterial thrombus 2/2 afib, or extreme changes in body temp--hypo/hyperthermia (though much less likely).   - trend total CK. - lipitor on hold in setting of rhabdo  Acute renal failure - Most likely caused by rhabod, other DD vs. BPH (patient complains of urgency, hesitancy, frequency, dribbling).   Differential includes  rhabdomyolysis induced (increased myoglobin toxic to kidney) vs. BPH (patient complains of urgency, hesitancy, frequency, dribbling) vs. crush injury (patient lying on floor on back/side for days and has large bruise on lower right back). FeNa << 1.  His Cre was normal yesterday, but trending up from 1.21 to 1.45 today. Likely due to lisinopril or pre-renal.  -will stop lisinopril -will give IVF at 75 cc/hour for 8 hours. -BMP in AM  Alcohol abuse - Patient reports 6-8 beers daily, though could be more as he is a poor historian on admission. Will monitor him for s/s withdrawal. His ETOH level <11 on admit. His LFT's are abnormal with AST > (ALT x 2).  - CIWA protocol - folic acid and thiamine - consulted SW  Tobacco abuse - Patient has a h/o 1/2 PPD x 45 years. In setting of stroke, HTN, and acute nature of symptoms will hold off on nicotine replacement for now.  - smoking cessation consulted  # Deposition: patient agreed to go to SNF, likely tomorrow.   Length of Stay: 4   Lorretta Harp, MD PGY1, Internal Medicine Teaching Service Pager: [phone]   08/27/2011, 6:57 PM

## 2011-08-27 NOTE — Progress Notes (Signed)
OT Cancellation Note  Treatment cancelled today due to medical issues with patient which prohibited therapy - BP supine 170/107.  Jeani Hawking M [phone] 08/27/2011, 2:54 PM

## 2011-08-27 NOTE — Progress Notes (Signed)
Patients BP 184/115 at 1045. Hydralazine 10mg  PRN given and SCHD Lisinopril. At 1150 BP down to 167/105. Dr. Loistine Chance paged. She returned the page and stated that the team was making changes to his BP meds. Will continue to monitor and carry out orders as needed.

## 2011-08-27 NOTE — Progress Notes (Signed)
Clinical Social Work-CSW provided bed offers and pt will d/c to [facility] with no payment necessary up front-CSW discussed arrangement with both pt and SNF- CSW notified treatment team- Jodean Lima, [phone]

## 2011-08-28 ENCOUNTER — Inpatient Hospital Stay (HOSPITAL_COMMUNITY): Payer: Medicare Other

## 2011-08-28 LAB — CBC
HCT: 42.9 % (ref 39.0–52.0)
HCT: 40.2 % (ref 39.0–52.0)
MCH: 36.1 pg — ABNORMAL HIGH (ref 26.0–34.0)
MCHC: 35.4 g/dL (ref 30.0–36.0)
MCV: 101.5 fL — ABNORMAL HIGH (ref 78.0–100.0)
Platelets: 107 10*3/uL — ABNORMAL LOW (ref 150–400)
Platelets: 103 10*3/uL — ABNORMAL LOW (ref 150–400)
RBC: 3.96 MIL/uL — ABNORMAL LOW (ref 4.22–5.81)
RDW: 12.5 % (ref 11.5–15.5)
WBC: 10.7 10*3/uL — ABNORMAL HIGH (ref 4.0–10.5)
WBC: 14.2 10*3/uL — ABNORMAL HIGH (ref 4.0–10.5)

## 2011-08-28 LAB — BASIC METABOLIC PANEL
BUN: 26 mg/dL — ABNORMAL HIGH (ref 6–23)
Chloride: 98 mEq/L (ref 96–112)
Creatinine, Ser: 1.49 mg/dL — ABNORMAL HIGH (ref 0.50–1.35)
Glucose, Bld: 91 mg/dL (ref 70–99)
Potassium: 4.1 mEq/L (ref 3.5–5.1)

## 2011-08-28 MED ORDER — SODIUM CHLORIDE 0.9 % IV BOLUS (SEPSIS)
1000.0000 mL | Freq: Once | INTRAVENOUS | Status: AC
  Administered 2011-08-28: 1000 mL via INTRAVENOUS

## 2011-08-28 MED ORDER — SODIUM CHLORIDE 0.9 % IV SOLN
INTRAVENOUS | Status: AC
  Administered 2011-08-28: 10:00:00 via INTRAVENOUS

## 2011-08-28 MED ORDER — AMLODIPINE BESYLATE 10 MG PO TABS: 10.0000 mg | ORAL_TABLET | Freq: Every day | ORAL | Status: DC

## 2011-08-28 MED ORDER — CARVEDILOL 12.5 MG PO TABS
12.5000 mg | ORAL_TABLET | Freq: Two times a day (BID) | ORAL | Status: DC
  Filled 2011-08-28 (×2): qty 1

## 2011-08-28 MED ORDER — THIAMINE HCL 100 MG PO TABS: 100.0000 mg | ORAL_TABLET | Freq: Every day | ORAL | Status: DC

## 2011-08-28 MED ORDER — CARVEDILOL 25 MG PO TABS: 25.0000 mg | ORAL_TABLET | Freq: Two times a day (BID) | ORAL | Status: DC

## 2011-08-28 MED ORDER — FOLIC ACID 1 MG PO TABS: 1.0000 mg | ORAL_TABLET | Freq: Every day | ORAL | Status: DC

## 2011-08-28 MED ORDER — RIVAROXABAN 20 MG PO TABS: 20.0000 mg | ORAL_TABLET | Freq: Every day | ORAL | Status: DC

## 2011-08-28 MED ORDER — ADULT MULTIVITAMIN W/MINERALS CH: 1.0000 | ORAL_TABLET | Freq: Every day | ORAL | Status: DC

## 2011-08-28 NOTE — Progress Notes (Signed)
Resident Co-sign Daily Note: I have seen the patient and reviewed the daily progress note by MS3 and discussed the care of the patient with them.  See below for documentation of my findings, assessment, and plans.  Subjective: Patients reports feeling well, denies any new complaints, no major events overnight.   Objective: Vital signs in last 24 hours: Filed Vitals:   08/28/11 0200 08/28/11 0600 08/28/11 0858 08/28/11 1000  BP: 161/89 131/90 150/82 109/74  Pulse: 76 70  61  Temp: 97.6 F (36.4 C) 97.4 F (36.3 C)  98.8 F (37.1 C)  TempSrc: Oral Oral  Oral  Resp: 20 20  18   Height:      Weight:      SpO2: 97% 98%  95%   Physical Exam: General: alert, well-developed, and cooperative to examination.  Lungs: normal respiratory effort, no accessory muscle use, normal breath sounds, no crackles, and no wheezes. Heart: normal rate, regular rhythm, no murmur, no gallop, and no rub.  Abdomen: soft, with mild TTP at lower quadrant,  normal bowel sounds,  Extremities: No cyanosis, clubbing, edema Neurologic: non focal except for 4/5 weakness on RUE  Lab Results: Reviewed and documented in Electronic Record Micro Results: Reviewed and documented in Electronic Record Studies/Results: Reviewed and documented in Electronic Record Medications: I have reviewed the patient's current medications. Scheduled Meds:   . amLODipine  10 mg Oral Daily  . carvedilol  25 mg Oral BID WC  . folic acid  1 mg Oral Daily  . LORazepam  0-4 mg Intravenous Q12H  . multivitamin with minerals  1 tablet Oral Daily  . rivaroxaban  20 mg Oral Daily  . thiamine  100 mg Oral Daily  . DISCONTD: amLODipine  5 mg Oral Daily  . DISCONTD: carvedilol  12.5 mg Oral BID WC  . DISCONTD: lisinopril  5 mg Oral Daily   Continuous Infusions:   . sodium chloride 75 mL/hr at 08/27/11 2318  . sodium chloride     PRN Meds:.acetaminophen, acetaminophen, DISCONTD: hydrALAZINE Assessment/Plan: Please refer to medical  students note for detailed plan, in brief;   Stroke: Patient is recovering well and is due to be discharged to a rehabilitation facility for further PT, continue current medications, consider discharge to SNF once patient is stable  A. Fib: Rate controlled, on xrelto for secondary prevention of stroke.   HTN: Blood pressure remained slightly elevated, start amlodipine and increase Coreg 25 mg twice a day.  Reassess blood pressure throughout the day.   Renal injury: Patient presented with a creatinine of 1.5, with IV fluids it dropped to 1.2, but then increased to 1.6 with fluid discontinuation and initiation of an ACE inhibitor there for ACE inhibitor was discontinued. Currently his creatinine is 1.56 which may be the patient's actual baseline creatinine. Continue to monitor and recheck basic metabolic panel in the morning. As an outpatient ACE inhibitor may again be tried.   Disposition: Transfer to SNF once blood pressure is stable.     LOS: 5 days   Oluwatamilore Starnes 08/28/2011, 11:22 AM

## 2011-08-28 NOTE — Progress Notes (Addendum)
Physical Therapy Treatment Patient Details Name: Brian Mclaughlin MRN: [id number] DOB: 05-Feb-1946 Today's Date: 08/28/2011 Time: 1116-1207 PT Time Calculation (min): 51 min  PT Assessment / Plan / Recommendation Comments on Treatment Session  Pt moving well with sustained attention at beginning of session. Noticeable low BP of 98/74 sitting EOB. RN present and aware. Pt had no c/o dizziness. However after ambulating ~10' pt began having right sided weakness and was returned to sitting. Once in chair pt BP decreased to 77/57.  Pt unable to move right side.  Attempted to stand with PT and OT and unable to stand.  Used maximove to return pt to bed in flat position.  MD present and performing neurological screen.  Dr. Pearlean Brownie called and STAT CT scan ordered.    Follow Up Recommendations  Skilled nursing facility    Barriers to Discharge        Equipment Recommendations       Recommendations for Other Services    Frequency Min 4X/week   Plan Frequency remains appropriate;Discharge plan needs to be updated    Precautions / Restrictions     Pertinent Vitals/Pain No c/o pain     Mobility  Bed Mobility Bed Mobility: Supine to Sit;Sitting - Scoot to Edge of Bed;Sit to Supine Supine to Sit: 6: Modified independent (Device/Increase time) Sitting - Scoot to Edge of Bed: 6: Modified independent (Device/Increase time) Sit to Supine: Other (comment);HOB flat (Use of maximove to safely return to supine) Details for Bed Mobility Assistance: Pt able to get OOB mod (I) however pt with right sided weakness during ambulation and needed lift to return to bed from chair. Transfers Transfers: Sit to Stand;Stand to Sit Sit to Stand: 5: Supervision;From chair/3-in-1 Stand to Sit: 2: Max assist;To chair/3-in-1 Details for Transfer Assistance: During ambulation pt immediately had right sided weakness and return to sitting with max (A).   Ambulation/Gait Ambulation/Gait Assistance: 4: Min guard;2: Max  assist Ambulation Distance (Feet): 15 Feet Assistive device: Rolling walker Ambulation/Gait Assistance Details: Pt ambulated ~10' before right sided buckling and weakness.  Pt rested against therapist and was slowly returned to chair.  PT/OT attempted to stand pt after right sided episode and unable.  Therfore lift was used to returned to bed. Gait Pattern: Step-to pattern;Decreased stance time - right;Shuffle;Narrow base of support Stairs: No Modified Rankin (Stroke Patients Only) Pre-Morbid Rankin Score: Slight disability Modified Rankin: Severe disability (intially 4 but increased to 5 during session)    Exercises     PT Diagnosis:    PT Problem List:   PT Treatment Interventions:     PT Goals Acute Rehab PT Goals PT Goal Formulation: With patient Time For Goal Achievement: 09/07/11 Potential to Achieve Goals: Good Pt will Roll Supine to Right Side: with modified independence PT Goal: Rolling Supine to Right Side - Progress: Progressing toward goal Pt will Roll Supine to Left Side: with modified independence PT Goal: Rolling Supine to Left Side - Progress: Not met Pt will go Supine/Side to Sit: with modified independence;with HOB 0 degrees PT Goal: Supine/Side to Sit - Progress: Not met Pt will go Sit to Stand: with modified independence PT Goal: Sit to Stand - Progress: Not met Pt will go Stand to Sit: with modified independence PT Goal: Stand to Sit - Progress: Not met Pt will Ambulate: >150 feet;with modified independence;with least restrictive assistive device PT Goal: Ambulate - Progress: Not met  Visit Information  Last PT Received On: 08/28/11 Assistance Needed: +2 PT/OT Co-Evaluation/Treatment: Yes  Subjective Data  Subjective: "I'm doing ok"   Cognition  Overall Cognitive Status: Impaired Area of Impairment: Attention;Safety/judgement;Awareness of errors;Awareness of deficits;Problem solving Arousal/Alertness: Awake/alert Orientation Level: Disoriented  to;Situation (Initially oriented x 4; After episode pt oreinted x3) Behavior During Session: Flat affect Current Attention Level: Sustained;Focused (Intially pt sustained attention and degressed to focus ) Attention - Other Comments: Pt attention changed from sustained to focus during right sided weakness episode.  Pt would take extra time to process and would not speak.  Pt prior to episode pt was had little verbalization but even more so after episode.  Once pt was returned to supine pt more verbal and improved attention. Safety/Judgement: Decreased safety judgement for tasks assessed;Decreased awareness of need for assistance;Impulsive Awareness of Errors: Assistance required to identify errors made Problem Solving: Pt. does not initate activity or conversation    Balance     End of Session PT - End of Session Equipment Utilized During Treatment: Gait belt Activity Tolerance: Treatment limited secondary to medical complications (Comment) Patient left: in bed;with call bell/phone within reach;with nursing in room (MD present with neurological assessment) Nurse Communication: Need for lift equipment    Kennan Detter 08/28/2011, 2:00 PM [place], PT DPT [phone]

## 2011-08-28 NOTE — Progress Notes (Signed)
Internal Medicine Attending  Date: 08/28/2011  Patient name: Brian Mclaughlin Medical record number: [id number] Date of birth: 1945/10/04 Age: 66 y.o. Gender: male  I saw and evaluated the patient, and discussed his care with resident Dr. Clyde Lundborg; see his note for details of clinical findings and plans.  Patient had an episode of hypotension and acute right-sided weakness today while ambulating; he was given IV normal saline and his antihypertensive medications were held, and his strength has now returned to normal.  The cause of his hypotension is not clear; it may be due to volume depletion secondary to inadequate oral intake.  His ACE inhibitor was stopped yesterday, and amlodipine was held this morning, so carvedilol was the only antihypertensive medication he received this morning.  His hypotension has corrected with IV normal saline.  Plan is continue normal saline IV volume replacement; hold antihypertensive medications; follow stool Hemoccults and recheck hemoglobin this evening; Xarelto was held today; if there no signs of GI bleeding, plan is to resume Xarelto tomorrow.

## 2011-08-28 NOTE — Progress Notes (Signed)
11:40 Pt ambulating with physical therapy when rt side became extremely weak. Pt was sat into chair BP was 78/51. Pt was slow cognitively and unable to answer questions without a lot of stimulus and prompting. His NIH went from 0 to 10. (Full neuro check charted in doc flow sheets). Pt was put back to bed and laid flat.  MD was notified and stated he was on his way. Ordered NS Bolus, stat Head CT, and hold BP meds. Pt currently stable and seems to be improving. Will continue to monitor.

## 2011-08-28 NOTE — Progress Notes (Signed)
Medical Student Daily Progress Note  Subjective: Patient is doing well. He slept okay and is amenable to [facility] for SNF placement. He has no new complaints this morning and his left sided lower back pain has improved since yesterday. He denies any changes in his bowel or bladder habits and remains afebrile. He is resting in bed comfortably on exam and ready for transfer once medically clear.  Objective: Vital signs in last 24 hours: Filed Vitals:   08/27/11 2200 08/28/11 0200 08/28/11 0600 08/28/11 0858  BP: 141/93 161/89 131/90 150/82  Pulse: 66 76 70   Temp: 98.2 F (36.8 C) 97.6 F (36.4 C) 97.4 F (36.3 C)   TempSrc: Oral Oral Oral   Resp: 20 20 20    Height:      Weight:      SpO2: 98% 97% 98%    Weight change:   Intake/Output Summary (Last 24 hours) at 08/28/11 1016 Last data filed at 08/27/11 1110  Gross per 24 hour  Intake      0 ml  Output    325 ml  Net   -325 ml   Physical Exam: BP 150/82  Pulse 70  Temp 97.4 F (36.3 C) (Oral)  Resp 20  Ht 6' (1.829 m)  Wt 101.742 kg (224 lb 4.8 oz)  BMI 30.42 kg/m2  SpO2 98% General appearance: alert, cooperative and no distress Head: Normocephalic, without obvious abnormality, atraumatic Eyes: conjunctivae/corneas clear. PERRL, EOM's intact. Fundi benign. Back: symmetric, no curvature. ROM normal. No CVA tenderness. Lungs: clear to auscultation bilaterally Chest wall: no tenderness Heart: irregularly irregular rhythm Abdomen: soft, non-tender; bowel sounds normal; no masses,  no organomegaly Extremities: extremities normal, atraumatic, no cyanosis or edema Skin: Skin color, texture, turgor normal. No rashes or lesions Neurologic: Mental status: Alert, oriented, thought content appropriate Cranial nerves:  I: smell Not tested  II: visual acuity  OS:     OD:   II: visual fields Fails accomodation reflex (L eye deviates laterally)  II: pupils Equal, round, reactive to light  III,VII: ptosis None    III,IV,VI: extraocular muscles  Full ROM  V: mastication Normal  V: facial light touch sensation  Normal  V,VII: corneal reflex  Present  VII: facial muscle function - upper  Normal  VII: facial muscle function - lower Normal  VIII: hearing Not tested  IX: soft palate elevation  R deviation  IX,X: gag reflex Present  XI: trapezius strength  5/5  XI: sternocleidomastoid strength 5/5  XI: neck flexion strength  5/5  XII: tongue strength  Normal   Sensory: normal Motor: R UE 4/5, L UE 5/5 Reflexes: 1+ UE, absent LE Lab Results: Basic Metabolic Panel:  Lab 08/28/11 2130 08/27/11 1638  NA 134* 130*  K 4.1 4.4  CL 98 95*  CO2 23 25  GLUCOSE 91 108*  BUN 26* 24*  CREATININE 1.49* 1.45*  CALCIUM 9.2 9.6  MG -- --  PHOS -- --   Liver Function Tests:  Lab 08/23/11 1202  AST 152*  ALT 48  ALKPHOS 92  BILITOT 2.0*  PROT 7.5  ALBUMIN 3.8   No results found for this basename: LIPASE:2,AMYLASE:2 in the last 168 hours  Lab 08/23/11 1203  AMMONIA 16   CBC:  Lab 08/28/11 0615 08/27/11 1105 08/23/11 1202  WBC 10.7* 10.5 --  NEUTROABS -- -- 7.0  HGB 15.2 15.1 --  HCT 42.9 42.5 --  MCV 101.4* 102.4* --  PLT 107* 104* --   Cardiac  Enzymes:  Lab 08/27/11 1105 08/24/11 0540 08/23/11 1932 08/23/11 1210  CKTOTAL 248* 2039* 2703* --  CKMB -- 7.3* 9.1* 13.1*  CKMBINDEX -- -- -- --  TROPONINI -- <0.30 <0.30 <0.30   BNP: No results found for this basename: PROBNP:3 in the last 168 hours D-Dimer: No results found for this basename: DDIMER:2 in the last 168 hours CBG: No results found for this basename: GLUCAP:6 in the last 168 hours Hemoglobin A1C:  Lab 08/23/11 1542  HGBA1C 5.3   Fasting Lipid Panel:  Lab 08/24/11 0540  CHOL 147  HDL 59  LDLCALC 75  TRIG 64  CHOLHDL 2.5  LDLDIRECT --   Thyroid Function Tests:  Lab 08/23/11 1542  TSH 1.869  T4TOTAL --  FREET4 --  T3FREE --  THYROIDAB --   Coagulation:  Lab 08/23/11 1202  LABPROT 13.7  INR 1.03    Anemia Panel:  Lab 08/23/11 1542  VITAMINB12 479  FOLATE --  FERRITIN --  TIBC --  IRON --  RETICCTPCT --   Urine Drug Screen: Drugs of Abuse     Component Value Date/Time   LABOPIA NONE DETECTED 08/23/2011 1620   COCAINSCRNUR NONE DETECTED 08/23/2011 1620   LABBENZ NONE DETECTED 08/23/2011 1620   AMPHETMU NONE DETECTED 08/23/2011 1620   THCU NONE DETECTED 08/23/2011 1620   LABBARB NONE DETECTED 08/23/2011 1620    Alcohol Level:  Lab 08/23/11 1202  ETH <11   Urinalysis:  Lab 08/27/11 2000 08/23/11 1620  COLORURINE YELLOW AMBER*  LABSPEC 1.017 1.033*  PHURINE 6.5 5.5  GLUCOSEU NEGATIVE NEGATIVE  HGBUR MODERATE* TRACE*  BILIRUBINUR NEGATIVE MODERATE*  KETONESUR NEGATIVE 40*  PROTEINUR 30* 30*  UROBILINOGEN 1.0 1.0  NITRITE NEGATIVE NEGATIVE  LEUKOCYTESUR NEGATIVE SMALL*    Micro Results: No results found for this or any previous visit (from the past 240 hour(s)). Studies/Results: No results found. Medications: I have reviewed the patient's current medications. Scheduled Meds:   . amLODipine  10 mg Oral Daily  . carvedilol  25 mg Oral BID WC  . folic acid  1 mg Oral Daily  . LORazepam  0-4 mg Intravenous Q12H  . multivitamin with minerals  1 tablet Oral Daily  . rivaroxaban  20 mg Oral Daily  . thiamine  100 mg Oral Daily  . DISCONTD: amLODipine  5 mg Oral Daily  . DISCONTD: carvedilol  12.5 mg Oral BID WC  . DISCONTD: lisinopril  5 mg Oral Daily   Continuous Infusions:   . sodium chloride 75 mL/hr at 08/27/11 2318  . sodium chloride     PRN Meds:.acetaminophen, acetaminophen, DISCONTD: hydrALAZINE Assessment/Plan:  AMS - Patient claimed that he fell asleep the day of his stroke symptoms (08/20/11) and denied passing out. His mental status was rather altered from that episode up to the time of admission, thus the details of the event and thereafter remain unknown as he was alone in his house for multiple days PTA. Seizure d/o ruled out (EEG neg 08/25/11). AMS  most likely related to acute phase of his stroke which has since improved. - resolved  Stroke - Patient has numerous risk factors for stroke (HTN, excessive etoh, afib). Last seen normal on Monday (08/18/11). Became confused 2 days later and incontinent of bowel and bladder function. Incontinence likely to be due to inability to ambulate from the floor in setting of his AMS and right leg weakness/numbness at onset of symptoms. CT head shows density in the medial left frontal lobe suggestive of an infarct. MRI  shows acute infarcts mainly left ACA territory (see read). Differential includes ischemic embolic event 2/2 afib (CHADS2 score =2, HF/HTN) vs. Atheroemboli (less likely as carotid dopplers were negative, see afib below).  - Appreciate neurology consult in the management of this patient  - Appreciate PT/OT and SLP assistance in the management of this patient  - d/c aspirin  - begin xarelto 20 mg daily for secondary prevention  - he will go to SNF for short term rehab to regain strength/balance  L CVA tenderness - Patient described a sharp pain on 08/27/11 that began overnight and was continuous in nature. It was not directly over the CVA however still concerning. He denies any history of kidney stones. UA and microscopy are negative for evidence of UTI. -resolved  Atrial fib - Unknown if new or old. Contacted [facility] on [address] and they do have patient in system but no information on patient as they converted to EMR 3-4 years ago and no data in computer. 2D echo reveals EF 25-30%, mild LVH, diffuse/severe hypokinesis and grade 2 diastolic dysfunction. Preliminary carotid dopplers reveal no significant stenosis and anterograde flow. He is not a good candidate currently for Coumadin given his compliance, fall risk, EtOH, and Pradaxa is a poor choice given his LFTs, however in the setting of his afib and recent stroke, he is a patient that does need anticoagulation.  - patients  seems amenable to EtOH cessation, still has large fall risk and no supervision at home  - Xarelto is a better choice in this patient considering his risks and LFTs    Hypertension - Patient's pressures on admission 142-169 (systolic) over 93-114 (diastolic). He has a h/o of untreated HTN. Pressures were increased overnight (161/89). Pressures were allowed to be elevated (48 hours) on admission to maintain adequate CPP.   - begin amlodipine 10 mg daily - increase carvedilol to 25 mg BID    Rhabdomyolysis - His CK,tot = 3920 and CK,MB = 13.1 on admission. Follow up enzymes, CK,tot= 248. Given that he was trapped on the floor for multiple days he may have rhabdo 2/2 confinement in a fixed position in setting of stroke, or due to alcohol intoxication. Current definition of rhabdo is a CK,tot that is >5x ULN which this patient met upon admission. However, stable plasma levels <5000 u/L do not require IVF as AKI risk is minimal in these patients. - stable and resolving  Acute renal failure - BUN = 39. SCr = 1.56. Ratio =25 on admission. Downward trend since admission. Differential includes rhabdomyolysis induced (increased myoglobin toxic to kidney) vs. BPH (patient complains of urgency, hesitancy, frequency, dribbling) vs. crush injury (patient lying on floor on back/side for days and has large bruise on lower right back). FeNa << 1. I believe patient's SCr on admission is actually close to his baseline and any decrease was a dilutional effect of IVF. Upon d/c of IVF he simply returned to baseline although addition of lisinopril increased it slightly above (1.65).  - recommend d/c IVF as risk of AKI 2/2 rhabdo very low (peak CK,tot <5000 u/L) -this is most likely his baseline  Increased MCV - Hgb 16.3, MCV = 103.5 on admission. B12 = 479 (WNL). Folate RBC = 807 (high). Remaining cause likely due to his alcohol abuse, especially with a normal Hgb. Acetaldehyde (breakdown product of ethanol) interferes with  cell division and causes increased volume in RBC's leading to macrocytosis.  - this would resolve upon cessation of alcohol abuse  Alcohol abuse - Patient reports 6-8 beers daily, though could be more as he is a poor historian on admission. Will monitor him for s/s withdrawal. His ETOH level <11 on admit. His LFT's are abnormal with AST > (ALT x 2). - appreciate CSW consult - CIWA protocol  - folic acid daily  - thiamine daily  - MV daily   Tobacco abuse - Patient has a h/o 0.5 PPD x 45 years. In setting of stroke, HTN, and acute nature of symptoms will hold off on nicotine replacement for now.  - smoking cessation consult   Disposition - He has agreed to go to [facility] for SNF upon discharge. Transfer pending clinical improvement.   LOS: 5 days   This is a Psychologist, occupational Note.  The care of the patient was discussed with Dr. Darnelle Maffucci and the assessment and plan formulated with their assistance.  Please see their attached note for official documentation of the daily encounter.  Lewie Chamber 08/28/2011, 10:16 AM

## 2011-08-28 NOTE — Progress Notes (Signed)
Patient Brian Mclaughlin, 66 year old white male suffers with many physical challenges and his cognitive abilities appear shaky.  However he is patient and talkative.  Patient thanked Orthoptist for providing pastoral presence, prayer, and conversation.  I will follow up as needed.

## 2011-08-28 NOTE — Progress Notes (Signed)
Stroke Team Progress Note  HISTORY Brian Mclaughlin is an 66 y.o. male who was found by his girlfriend on 08/22/11 in the floor holding a broom in his own stool and urine. MRI of the brain  08/23/2011 left frontal acute parasagittal, posterior frontal parasagittal, left parietal parasagittal infarctions without hemorrhage. Patient was not a tPA candidate secondary to presenting beyond the window.   Patient had question of seizure and was felt to possibly Todds paralysis. EEG was normal.  Today while walking with PT the patient had orthostatic hypotension with BP down to 77/57. It is documented by PT that he had right sided weakness. He is receiving IVF Bolus and BP now 154/102. Patient admits that he is not hydrating himself.  SUBJECTIVE  Patient lying in bed. He recalls becoming dizzy when walking with therapy today but states that he did not have weakness of extremities or speech difficulties.   OBJECTIVE Most recent Vital Signs: Filed Vitals:   08/28/11 1000 08/28/11 1140 08/28/11 1154 08/28/11 1400  BP: 109/74 78/54 77/57  154/102  Pulse: 61  65 87  Temp: 98.8 F (37.1 C)  98.2 F (36.8 C) 99.1 F (37.3 C)  TempSrc: Oral  Oral Oral  Resp: 18  17 17   Height:      Weight:      SpO2: 95%   98%   CBG (last 3)  No results found for this basename: GLUCAP:3 in the last 72 hours Intake/Output from previous day: 06/12 0701 - 06/13 0700 In: -  Out: 675 [Urine:675]  IV Fluid Intake:      . sodium chloride 75 mL/hr at 08/27/11 2318  . sodium chloride 75 mL/hr at 08/28/11 1015    MEDICATIONS     . folic acid  1 mg Oral Daily  . LORazepam  0-4 mg Intravenous Q12H  . multivitamin with minerals  1 tablet Oral Daily  . rivaroxaban  20 mg Oral Daily  . sodium chloride  1,000 mL Intravenous Once  . thiamine  100 mg Oral Daily  . DISCONTD: amLODipine  10 mg Oral Daily  . DISCONTD: amLODipine  5 mg Oral Daily  . DISCONTD: carvedilol  12.5 mg Oral BID WC  . DISCONTD: carvedilol  25 mg Oral  BID WC  . DISCONTD: lisinopril  5 mg Oral Daily   PRN:  acetaminophen, acetaminophen  Diet:  Cardiac thin liquids Activity:   Up with assistance DVT Prophylaxis:  SCD  CLINICALLY SIGNIFICANT STUDIES Basic Metabolic Panel:   Lab 08/28/11 0615 08/27/11 1638  NA 134* 130*  K 4.1 4.4  CL 98 95*  CO2 23 25  GLUCOSE 91 108*  BUN 26* 24*  CREATININE 1.49* 1.45*  CALCIUM 9.2 9.6  MG -- --  PHOS -- --   Liver Function Tests:   Lab 08/23/11 1202  AST 152*  ALT 48  ALKPHOS 92  BILITOT 2.0*  PROT 7.5  ALBUMIN 3.8   CBC:   Lab 08/28/11 0615 08/27/11 1105 08/23/11 1202  WBC 10.7* 10.5 --  NEUTROABS -- -- 7.0  HGB 15.2 15.1 --  HCT 42.9 42.5 --  MCV 101.4* 102.4* --  PLT 107* 104* --   Coagulation:   Lab 08/23/11 1202  LABPROT 13.7  INR 1.03   Cardiac Enzymes:   Lab 08/27/11 1105 08/24/11 0540 08/23/11 1932 08/23/11 1210  CKTOTAL 248* 2039* 2703* --  CKMB -- 7.3* 9.1* 13.1*  CKMBINDEX -- -- -- --  TROPONINI -- <0.30 <0.30 <0.30   Urinalysis:  Lab 08/27/11 2000 08/23/11 1620  COLORURINE YELLOW AMBER*  LABSPEC 1.017 1.033*  PHURINE 6.5 5.5  GLUCOSEU NEGATIVE NEGATIVE  HGBUR MODERATE* TRACE*  BILIRUBINUR NEGATIVE MODERATE*  KETONESUR NEGATIVE 40*  PROTEINUR 30* 30*  UROBILINOGEN 1.0 1.0  NITRITE NEGATIVE NEGATIVE  LEUKOCYTESUR NEGATIVE SMALL*   Lipid Panel    Component Value Date/Time   CHOL 147 08/24/2011 0540   TRIG 64 08/24/2011 0540   HDL 59 08/24/2011 0540   CHOLHDL 2.5 08/24/2011 0540   VLDL 13 08/24/2011 0540   LDLCALC 75 08/24/2011 0540   HgbA1C  Lab Results  Component Value Date   HGBA1C 5.3 08/23/2011    Urine Drug Screen:     Component Value Date/Time   LABOPIA NONE DETECTED 08/23/2011 1620   COCAINSCRNUR NONE DETECTED 08/23/2011 1620   LABBENZ NONE DETECTED 08/23/2011 1620   AMPHETMU NONE DETECTED 08/23/2011 1620   THCU NONE DETECTED 08/23/2011 1620   LABBARB NONE DETECTED 08/23/2011 1620    Alcohol Level:   Lab 08/23/11 1202  ETH <11   CT of the  brain Low density in the medial left frontal lobe is suggestive for an infarct. Age of the infarct is uncertain but could be subacute. No evidence for acute hemorrhage. Atrophy and evidence of chronic small vessel ischemic changes  MRI of the brain  08/23/2011 left frontal Acute parasagittal, posterior frontal parasagittal, left parietal parasagittal infarctions without hemorrhage.  Atrophy and small vessel disease.  MRA of the brain  08/23/2011  Suspected 50% stenosis right ICA cavernous segment.  No ACA occlusion is observed, but proximal 50% stenosis  A2 segment left ACA could contribute to the observed pattern of infarction.    2D Echocardiogram  EF 25-30% with no source of embolus.   Carotid Doppler  No internal carotid artery stenosis bilaterally. Vertebrals with antegrade flow bilaterally.   CXR  No active disease. Mild degenerative changes thoracic spine.   EKG  atrial fibrillation, rate 78.   Physical Exam   Awake alert, no distress. Afebrile. Head is nontraumatic. Neck is supple without bruit. Hearing is normal. Cardiac exam no murmur or gallop. Lungs are clear to auscultation. Distal pulses palpable. Neurological exam   Awake  Alert oriented x 3. Normal speech and language. eye movements full without nystagmus.normal acuity and visual fields. Face symmetric. Tongue midline. Normal strength, tone, reflexes and coordination. Normal sensation. Gait deferred.   ASSESSMENT  Brian Mclaughlin is a 66 y.o. 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 Infarct secondary to atrial fibrillation but given his presentation may have had a seizure at onset with Todds Paralysis.  On no known medications  prior to admission. On Xarelto for primary stroke prevention in the setting of atrial fibrillation. Patient has had an orthostatic hypotensive episode, neurologically back to  baseline.  Patient was seen by primary service. CT head Wo contrast was ordered and showed evolutionary changes of the anterior medial left frontal infarct.  -hypertension -alcohol use -tobacco use -orthostatic hypotension  Hospital day # 5  TREATMENT/PLAN -Xarelto for secondary stroke prevention given atrial fibrillation. -Normal EEG -Agree with IVF hydration and encouraged po intake.   Guy Franco, [facility],  MBA, [facility]  [phone] Dr. Delia Heady  Scribe for Dr. Delia Heady who examined and discussed with the patient his plan of care.

## 2011-08-28 NOTE — Progress Notes (Signed)
   Paged about patient with an acute episode of hypotension (98/60-->77/57), altered mental status, profound right side weakness (right arm and leg). On arrival to patient he was being assessed by nursing staff and chair lifted back to bed. On exam I found him to be oriented to person (correct name, correct name of roommate, and he has 1 daughter), place (state, city, hospital), time (june, but wrong year, 2015). He does not have any facial droop or slurring of speech. Strength right arm 2/5, right leg 2/5, negative babinski.   Dr. Pearlean Brownie paged and orders given for NS 1 L bolus @ 500 cc/hr, STAT head CT without contrast, hold BP meds and xarelto pending results, and keep patient lying FLAT in bed.  Patient stable and lying flat in bed upon end of exam.   Episode could either be progression of his old stroke or a new stroke (either hemorraghic or ischemic). Page with questions. Thank you.  Lewie Chamber, MSIII [phone]   I saw patient and evaluated patient. I agree with Student's assessment and plan.  In addition, patient UA is positive for Hgb and red blood cells. Given patient tenderness over his left CVA area,  patient may have kidney stone. Will get CT-abd/pelvis without contrast.    Lorretta Harp, MD PGY1, Internal Medicine Teaching Service Pager: [phone]

## 2011-08-29 ENCOUNTER — Encounter: Payer: Self-pay | Admitting: Internal Medicine

## 2011-08-29 DIAGNOSIS — G8190 Hemiplegia, unspecified affecting unspecified side: Secondary | ICD-10-CM

## 2011-08-29 DIAGNOSIS — I6340 Cerebral infarction due to embolism of unspecified cerebral artery: Secondary | ICD-10-CM

## 2011-08-29 LAB — BASIC METABOLIC PANEL
CO2: 22 mEq/L (ref 19–32)
Calcium: 8.8 mg/dL (ref 8.4–10.5)
Chloride: 101 mEq/L (ref 96–112)
Glucose, Bld: 94 mg/dL (ref 70–99)
Sodium: 133 mEq/L — ABNORMAL LOW (ref 135–145)

## 2011-08-29 LAB — CBC
HCT: 40.1 % (ref 39.0–52.0)
Hemoglobin: 14.1 g/dL (ref 13.0–17.0)
MCH: 35.8 pg — ABNORMAL HIGH (ref 26.0–34.0)
MCHC: 35.2 g/dL (ref 30.0–36.0)
RDW: 12.4 % (ref 11.5–15.5)

## 2011-08-29 MED ORDER — SODIUM CHLORIDE 0.9 % IV SOLN
INTRAVENOUS | Status: DC
  Administered 2011-08-30: 75 mL/h via INTRAVENOUS

## 2011-08-29 MED ORDER — METOPROLOL TARTRATE 12.5 MG HALF TABLET
12.5000 mg | ORAL_TABLET | Freq: Two times a day (BID) | ORAL | Status: DC
  Filled 2011-08-29: qty 1

## 2011-08-29 NOTE — Progress Notes (Signed)
Clinical Social Work-CSW able to locate bed at [facility] will attempt to hold bed and accept pt when medically ready with no advanced payment necessary- CSW will follow-Emmanual Gauthreaux-MSW- [phone]

## 2011-08-29 NOTE — Progress Notes (Signed)
Medical Student Daily Progress Note  Subjective: Huge improvement overnight and since yesterday's episode. Approximately 12pm yesterday he sustained an episode of right sided weakness and AMS. See follow up progress note for rest of episode. His BP has since resolved and his symptoms have waned. This morning he is able to answer questions, follow commands, and his strength has returned. He is resting in bed comfortably on exam.  Objective: Vital signs in last 24 hours: Filed Vitals:   08/28/11 2005 08/28/11 2200 08/29/11 0243 08/29/11 0500  BP: 116/77 143/85 135/89 115/83  Pulse: 67 59 70 70  Temp: 99.3 F (37.4 C) 98.3 F (36.8 C) 98.3 F (36.8 C) 98.4 F (36.9 C)  TempSrc: Oral Oral Oral Oral  Resp: 20 20 18 20   Height:      Weight:    97.932 kg (215 lb 14.4 oz)  SpO2: 94% 97% 99% 98%   Weight change:   Intake/Output Summary (Last 24 hours) at 08/29/11 0802 Last data filed at 08/29/11 0200  Gross per 24 hour  Intake      0 ml  Output    675 ml  Net   -675 ml   Physical Exam: BP 115/83  Pulse 70  Temp 98.4 F (36.9 C) (Oral)  Resp 20  Ht 6' (1.829 m)  Wt 97.932 kg (215 lb 14.4 oz)  BMI 29.28 kg/m2  SpO2 98% General appearance: alert, cooperative and no distress Head: Normocephalic, without obvious abnormality, atraumatic Eyes: conjunctivae/corneas clear. PERRL, EOM's intact. Fundi benign. Back: symmetric, no curvature. ROM normal. No CVA tenderness. Lungs: clear to auscultation bilaterally Chest wall: no tenderness Heart: irregularly irregular rhythm Abdomen: soft, non-tender; bowel sounds normal; no masses,  no organomegaly Extremities: extremities normal, atraumatic, no cyanosis or edema Skin: ecchymosis on right lower back approx. 5cm x 5 cm Neurologic: Mental status: Alert, oriented, thought content appropriate Cranial nerves:  I: smell Not tested  II: visual acuity  OS:     OD:   II: visual fields Fails accomodation reflex (L eye deviates laterally)  II:  pupils Equal, round, reactive to light  III,VII: ptosis None  III,IV,VI: extraocular muscles  Full ROM  V: mastication Normal  V: facial light touch sensation  Normal  V,VII: corneal reflex  Present  VII: facial muscle function - upper  Normal  VII: facial muscle function - lower Normal  VIII: hearing Not tested  IX: soft palate elevation  R deviation  IX,X: gag reflex Present  XI: trapezius strength  5/5  XI: sternocleidomastoid strength 5/5  XI: neck flexion strength  5/5  XII: tongue strength  Normal   Sensory: normal Motor: R arm 3-4/5, L arm 5/5. R leg 3/5, L leg 5/5 Reflexes: 1+ UE, absent LE Lab Results: Basic Metabolic Panel:  Lab 08/29/11 5284 08/28/11 0615  NA 133* 134*  K 4.4 4.1  CL 101 98  CO2 22 23  GLUCOSE 94 91  BUN 28* 26*  CREATININE 1.59* 1.49*  CALCIUM 8.8 9.2  MG -- --  PHOS -- --   Liver Function Tests:  Lab 08/23/11 1202  AST 152*  ALT 48  ALKPHOS 92  BILITOT 2.0*  PROT 7.5  ALBUMIN 3.8   No results found for this basename: LIPASE:2,AMYLASE:2 in the last 168 hours  Lab 08/23/11 1203  AMMONIA 16   CBC:  Lab 08/29/11 0630 08/28/11 2014 08/23/11 1202  WBC 12.9* 14.2* --  NEUTROABS -- -- 7.0  HGB 14.1 14.3 --  HCT 40.1 40.2 --  MCV 101.8* 101.5* --  PLT 94* 103* --   Cardiac Enzymes:  Lab 08/27/11 1105 08/24/11 0540 08/23/11 1932 08/23/11 1210  CKTOTAL 248* 2039* 2703* --  CKMB -- 7.3* 9.1* 13.1*  CKMBINDEX -- -- -- --  TROPONINI -- <0.30 <0.30 <0.30   BNP: No results found for this basename: PROBNP:3 in the last 168 hours D-Dimer: No results found for this basename: DDIMER:2 in the last 168 hours CBG: No results found for this basename: GLUCAP:6 in the last 168 hours Hemoglobin A1C:  Lab 08/23/11 1542  HGBA1C 5.3   Fasting Lipid Panel:  Lab 08/24/11 0540  CHOL 147  HDL 59  LDLCALC 75  TRIG 64  CHOLHDL 2.5  LDLDIRECT --   Thyroid Function Tests:  Lab 08/23/11 1542  TSH 1.869  T4TOTAL --  FREET4 --  T3FREE  --  THYROIDAB --   Coagulation:  Lab 08/23/11 1202  LABPROT 13.7  INR 1.03   Anemia Panel:  Lab 08/23/11 1542  VITAMINB12 479  FOLATE --  FERRITIN --  TIBC --  IRON --  RETICCTPCT --   Urine Drug Screen: Drugs of Abuse     Component Value Date/Time   LABOPIA NONE DETECTED 08/23/2011 1620   COCAINSCRNUR NONE DETECTED 08/23/2011 1620   LABBENZ NONE DETECTED 08/23/2011 1620   AMPHETMU NONE DETECTED 08/23/2011 1620   THCU NONE DETECTED 08/23/2011 1620   LABBARB NONE DETECTED 08/23/2011 1620    Alcohol Level:  Lab 08/23/11 1202  ETH <11   Urinalysis:  Lab 08/27/11 2000 08/23/11 1620  COLORURINE YELLOW AMBER*  LABSPEC 1.017 1.033*  PHURINE 6.5 5.5  GLUCOSEU NEGATIVE NEGATIVE  HGBUR MODERATE* TRACE*  BILIRUBINUR NEGATIVE MODERATE*  KETONESUR NEGATIVE 40*  PROTEINUR 30* 30*  UROBILINOGEN 1.0 1.0  NITRITE NEGATIVE NEGATIVE  LEUKOCYTESUR NEGATIVE SMALL*    Micro Results: No results found for this or any previous visit (from the past 240 hour(s)). Studies/Results: Ct Abdomen Pelvis Wo Contrast  08/28/2011  *RADIOLOGY REPORT*  Clinical Data: Bilateral flank pain.  Question kidney stone.  CT ABDOMEN AND PELVIS WITHOUT CONTRAST  Technique:  Multidetector CT imaging of the abdomen and pelvis was performed following the standard protocol without intravenous contrast.  Comparison: None.  Findings: The lung bases are clear.  There is no pleural effusion. There is a small hiatal hernia.  There is asymmetric perinephric soft tissue stranding on the left, especially superiorly.  No renal or ureteral calculi are demonstrated.  There is no significant hydronephrosis.  There is no evidence of bladder calculus.  No focal renal abnormalities are seen.  As evaluated in the noncontrast state, the liver, spleen, pancreas and adrenal glands appear unremarkable.  The gallbladder is contracted without surrounding inflammation.  The bowel gas pattern is normal.  The stomach is poorly distended. The  appendix appears normal.  There is mild diffuse aorto iliac atherosclerosis.  No other focal inflammatory changes are identified.  The prostate gland and seminal vesicles appear unremarkable for age.  There are mild degenerative changes of the lower lumbar spine. There are nondisplaced fractures of the right ninth and tenth ribs posteriorly which appear acute or subacute.  No other fractures are identified.  There are bilateral femoral neck synovial herniation pits.  IMPRESSION:  1.  Nonspecific asymmetric perinephric soft tissue stranding on the left.  This could be secondary to a recently passed ureteral calculus or inflammation.  Correlation with urine analysis recommended. 2.  No evidence of urinary tract calculus or hydronephrosis. 3.  Acute or  subacute nondisplaced fractures of the right ninth and tenth ribs posteriorly.  Original Report Authenticated By: Gerrianne Scale, M.D.   Ct Head Wo Contrast  08/28/2011  *RADIOLOGY REPORT*  Clinical Data: Acute left frontal infarct, syncopal episode  CT HEAD WITHOUT CONTRAST  Technique:  Contiguous axial images were obtained from the base of the skull through the vertex without contrast.  Comparison: 08/23/2011  Findings: Brain atrophy noted with extensive microvascular ischemic changes in the periventricular white matter.  Remote right caudate nucleus lacunar type infarct.  Evolutionary changes of a medial anterior frontal infarction predominately in the subcortical white matter.  No interval acute hemorrhage, mass effect, herniation, midline shift, hydrocephalus, or extra-axial fluid collection. Cisterns patent.  Cerebellar atrophy as well.  Atherosclerosis of the intracranial vessels.  IMPRESSION: Evolutionary changes of the anterior medial left frontal infarct. Stable atrophy and microvascular ischemic changes.  No acute intracranial hemorrhage or mass effect  Original Report Authenticated By: Judie Petit. Ruel Favors, M.D.   Medications: I have reviewed the patient's  current medications. Scheduled Meds:   . folic acid  1 mg Oral Daily  . multivitamin with minerals  1 tablet Oral Daily  . rivaroxaban  20 mg Oral Daily  . sodium chloride  1,000 mL Intravenous Once  . thiamine  100 mg Oral Daily  . DISCONTD: amLODipine  10 mg Oral Daily  . DISCONTD: carvedilol  12.5 mg Oral BID WC  . DISCONTD: carvedilol  25 mg Oral BID WC   Continuous Infusions:   . sodium chloride 75 mL/hr at 08/28/11 1015   PRN Meds:.acetaminophen, acetaminophen Assessment/Plan:  AMS - Episode of AMS is resolved since yesterday. Patient claimed that he fell asleep the day of his stroke symptoms (08/20/11) and denied passing out. His mental status was rather altered from that episode up to the time of admission, thus the details of the event and thereafter remain unknown as he was alone in his house for multiple days PTA. Seizure d/o ruled out (EEG neg 08/25/11). AMS most likely related to acute phase of his stroke which has since improved.  - resolved   Stroke - Patient has numerous risk factors for stroke (HTN, excessive etoh, afib). Last seen normal on Monday (08/18/11). Became confused 2 days later and incontinent of bowel and bladder function. Incontinence likely to be due to inability to ambulate from the floor in setting of his AMS and right leg weakness/numbness at onset of symptoms. CT head shows density in the medial left frontal lobe suggestive of an infarct. MRI shows acute infarcts mainly left ACA territory (see read). Differential includes ischemic embolic event 2/2 afib (CHADS2 score =2, HF/HTN) vs. Atheroemboli (less likely as carotid dopplers were negative, see afib below).  - Appreciate neurology consult in the management of this patient  - Appreciate PT/OT and SLP assistance in the management of this patient  - d/c aspirin  - xarelto was held yesterday, though since CT head negative, will resume today  - he will go to SNF for short term rehab to regain strength/balance    L CVA tenderness - Patient described a sharp left sided lower back pain that began 08/27/11 and was continuous in nature. It was not directly over the CVA however still concerning. He denies any history of kidney stones. UA and microscopy negative for UTI, however positive for RBC's. CT pelvis shows soft tissue stranding indicating possible passage of a recent kidney stone, which correlates well with RBC's in urine. No presence of stones seen.  -  resolved   Atrial fib - Unknown if new or old. Contacted [facility] on [address] and they do have patient in system but no information on patient as they converted to EMR 3-4 years ago and no data in computer. 2D echo reveals EF 25-30%, mild LVH, diffuse/severe hypokinesis and grade 2 diastolic dysfunction. Preliminary carotid dopplers reveal no significant stenosis and anterograde flow. He is not a good candidate currently for Coumadin given his compliance, fall risk, EtOH, and Pradaxa is a poor choice given his LFTs, however in the setting of his afib and recent stroke, he is a patient that does need anticoagulation.  - patients seems amenable to EtOH cessation, still has large fall risk and no supervision at home  - Xarelto is a better choice in this patient considering his risks and LFTs   Hypertension - Patient's pressures on admission 142-169 (systolic) over 93-114 (diastolic). He has a h/o of untreated HTN. Pressures under better control now (115/83). Pressures were allowed to be elevated (48 hours) on admission to maintain adequate CPP.  - will resume BP meds back very slowly over the weekend  Rhabdomyolysis - His CK,tot = 3920 and CK,MB = 13.1 on admission. Follow up enzymes, CK,tot= 248. Given that he was trapped on the floor for multiple days he may have rhabdo 2/2 confinement in a fixed position in setting of stroke, or due to alcohol intoxication. Current definition of rhabdo is a CK,tot that is >5x ULN which this patient met  upon admission. However, stable plasma levels <5000 u/L do not require IVF as AKI risk is minimal in these patients.  - stable and resolving   Acute renal failure - BUN = 39. SCr = 1.56. Ratio =25 on admission. Downward trend with IVF but slight rise upon cessation of fluids. Differential includes rhabdomyolysis induced (increased myoglobin toxic to kidney) vs. BPH (patient complains of urgency, hesitancy, frequency, dribbling) vs. crush injury (patient lying on floor on back/side for days and has large bruise on lower right back). FeNa << 1. Patient's SCr on admission is probably close to his baseline and the decrease may have been partially a dilutional effect of IVF. Given that his FeNa was low, he is still prerenal in the setting of his poor po intake at home after being confined to the floor multiple days. Risk of AKI 2/2 rhabdo very low (peak CK,tot <5000 u/L).  - restart IVF, NS @ 75 x 24 hours  Increased MCV - Hgb 16.3, MCV = 103.5 on admission. B12 = 479 (WNL). Folate RBC = 807 (high). Remaining cause likely due to his alcohol abuse, especially with a normal Hgb. Acetaldehyde (breakdown product of ethanol) interferes with cell division and causes increased volume in RBC's leading to macrocytosis.  - this would resolve upon cessation of alcohol abuse   Alcohol abuse - Patient reports 6-8 beers daily, though could be more as he is a poor historian on admission. Will monitor him for s/s withdrawal. His ETOH level <11 on admit. His LFT's are abnormal with AST > (ALT x 2).  - appreciate CSW consult  - CIWA protocol  - folic acid daily  - thiamine daily  - MV daily   Tobacco abuse - Patient has a h/o 0.5 PPD x 45 years. In setting of stroke, HTN, and acute nature of symptoms will hold off on nicotine replacement for now.  - smoking cessation consult   Disposition - He seems much improved since yesterday. Adding back BP meds  slowly then transfer to SNF on Monday.   LOS: 6 days   This is a  Psychologist, occupational Note.  The care of the patient was discussed with Dr. Lorretta Harp and the assessment and plan formulated with their assistance.  Please see their attached note for official documentation of the daily encounter.  Brian Mclaughlin 08/29/2011, 8:02 AM     Resident Addendum to Medical Student Note   I have seen and examined the patient, and agree with the the medical student assessment and plan outlined above. Please see my brief note below for additional details.  OBJECTIVE: VS: Reviewed  Meds: Reviewed  Labs: Reviewed  Imaging: Reviewed   PE:  General: resting in bed, not in acute distress HEENT: PERRL, EOMI, no scleral icterus. No bruit and JVD. Cardiac: S1/S2, RRR, No murmurs, gallops or rubs Pulm: Good air movement bilaterally, Clear to auscultation bilaterally, No rales, wheezing, rhonchi or rubs. Abd: Soft,  nondistended, nontender, no rebound pain, no organomegaly, BS present. There is CVA tenderness on the left. Ext: No rashes or edema, 2+DP/PT pulse bilaterally Skin: no rashes. No skin bruise. Neuro: alert and oriented X3, cranial nerves II-XII grossly intact, except for failure of conjugation of left eye and weaker palatal elevation on the left. Sensation to light touch intact. 1+ brachial reflex and very weak knee reflex bilaterally. Left ankle muscle strength is 2/5 on dorsal flexion (patient has chronic left ankle weakness for many years). Left leg muscle strength is 5/5.  Upper extremety muscle strength 5/5 bilaterally. right leg muscle strength is  5/5. Normal finger-to-nose.   ASSESSMENT/ PLAN  # AMS:  Although patient claimed that he fell asleep on Wednesday and denies passing out, it is not completely clear about what happened in that day. Differential diagnosis includes seizure episode, syncope including vasovagal syncope, cardiac event, hypoglycemia, and neurologic event. EEG is negative for seizure.   - continue cardiac monitor - seizure precaution  #  Stroke - patient's symptoms is most likely caused by ischemic stroke given the findings on CT scan and MRI. A. fib is an important etiology. Patient also has other risk factors including hypertension and alcohol abuse. Other etiologies include hypercoagulable status and paradoxical embolism. Regarding patient's incontinence, it is most likely caused by stroke. But other possibility needs to be considered too, such as spinal cord lesions, Cauda Equina syndrome and seizure. 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 Carodid  Doppler negative.   - Appreciate neurology consult in the management of this patient. Per neurology, TEE is not necessary since the results will not change the management. - continue Xarelto for secondary prevention. After a sudden worsening of his Right side weakness yesterday , his CT-head showed Evolutionary changes of the anterior medial left frontal infarct. No new hemorrhage.  - PT/OT  Atrial fib - etiology is not clear currently. The possible reasons include HTN, hyperthyroidism, COPD; ischemic heart disease, valvular disease and infection.  However it doesn't seem to be caused by cardiac ischemia given that the patient does not have any chest pain and EKG is negative for ischemia. Study showed that heparin is contraindicated in patient with A.Fib who presents with acute stroke. It is recommended to start Coumadin after 24-48 hours to decrease morbidity and mortality.  Generally, if the Atrial fibrillation happened within 48 hours, cardioversion can be performed. However, the exact onset time of his atrial Fibrilation is not clear. Patient does not have RVR currently. HTS is 1.869. His 2D Echo showed EF 25  to 30% and no thrombus and has has grade 2 diastolic dysfunction.   - appreciate cariology help in magaging our patient. will follow up for recommendations. - will hold metoprolmetoprolol given his bp is still running at low level - will continue xarelto for secondary stroke  prevention given atrial fibrillation. Patient is heavy drinker and has high risk for fall. He is not good candidate for coumadin.   Hypertension - bp is 142/92  -Will hold bp medication today. -will resume Bp medications if bp is elevated further.  Rhabdomyolysis - improving.  His total CK is trending down from 3920 to 248 today.  Given that he was trapped on the floor for multiple days he may have rhabdo 2/2 confinement in a fixed position in setting of stroke, or due to alcohol intoxication. Other causes can be arterial thrombus 2/2 afib, or extreme changes in body temp--hypo/hyperthermia (though much less likely).   - trend total CK. - lipitor on hold in setting of rhabdo  Acute renal failure - Most likely caused by rhabod, other DD vs. BPH (patient complains of urgency, hesitancy, frequency, dribbling).   Differential includes rhabdomyolysis induced (increased myoglobin toxic to kidney) vs. BPH (patient complains of urgency, hesitancy, frequency, dribbling) vs. crush injury (patient lying on floor on back/side for days and has large bruise on lower right back). FeNa << 1.  His Cre was normal yesterday, but trending up from 1.21 to 1.59 today. Likely due to lisinopril or pre-renal.  -will avoid lisinopril -BMP in AM  Alcohol abuse - Patient reports 6-8 beers daily, though could be more as he is a poor historian on admission. Will monitor him for s/s withdrawal. His ETOH level <11 on admit. His LFT's are abnormal with AST > (ALT x 2).  - CIWA protocol - folic acid and thiamine - consulted SW  Tobacco abuse - Patient has a h/o 1/2 PPD x 45 years. In setting of stroke, HTN, and acute nature of symptoms will hold off on nicotine replacement for now.  - smoking cessation consulted   Length of Stay: 6   Lorretta Harp, MD PGY1, Internal Medicine Teaching Service Pager: [phone]   08/29/2011, 4:56 PM

## 2011-08-29 NOTE — Consult Note (Signed)
Pt says he smokes 1/2 ppd. He has quit once before for 6 months cold [place]. At this time he is not ready to quit but plans to quit in the near future or eventually. Encouraged pt to quit. Pt in pre-contemplation to contemplation stage. Referred to 1-800 quit now for f/u and support. Discussed oral fixation substitutes, second hand smoke and in home smoking policy. Reviewed and gave pt Written education/contact information.

## 2011-08-29 NOTE — Progress Notes (Signed)
Internal Medicine Attending  Date: 08/29/2011  Patient name: Brian Mclaughlin Medical record number: [id number] Date of birth: May 13, 1945 Age: 66 y.o. Gender: male  I saw and evaluated the patient and discussed his care on a.m. rounds with house staff; see the note by resident Dr. Clyde Lundborg for details of clinical findings and plans.  Patient reports that his strength has returned to normal following his episode yesterday of right-sided weakness and hypotension; exam shows intact strength on the right.  Agree with plan to continue gentle normal saline volume replacement; restart Xarelto; monitor and follow blood pressure closely.

## 2011-08-29 NOTE — Progress Notes (Signed)
Physical Therapy Treatment Patient Details Name: Brian Mclaughlin MRN: [id number] DOB: 1945/04/14 Today's Date: 08/29/2011 Time: 9147-8295 PT Time Calculation (min): 26 min  PT Assessment / Plan / Recommendation Comments on Treatment Session  pt presents with CVA and yesterday a hypotensive event.  pt seems to have recovered from events yesterday and now not demonstrating Rsided weakness noted yesterday.      Follow Up Recommendations  Home health PT;Supervision/Assistance - 24 hour (pt needs 24hr A.  )    Barriers to Discharge        Equipment Recommendations  Rolling walker with 5" wheels;Tub/shower seat    Recommendations for Other Services    Frequency Min 4X/week   Plan Frequency remains appropriate    Precautions / Restrictions Precautions Precautions: Fall Restrictions Weight Bearing Restrictions: No   Pertinent Vitals/Pain Denies    Mobility  Bed Mobility Bed Mobility: Not assessed Transfers Transfers: Sit to Stand;Stand to Sit Sit to Stand: 4: Min assist;With upper extremity assist;With armrests;From chair/3-in-1 Stand to Sit: 4: Min assist;With upper extremity assist;With armrests;To chair/3-in-1 Details for Transfer Assistance: Demos good use of UEs Ambulation/Gait Ambulation/Gait Assistance: 4: Min guard;4: Min Environmental consultant (Feet): 250 Feet Assistive device: Rolling walker Ambulation/Gait Assistance Details: cues to slow down and attend to obstacles.  MinA for balance with turns Gait Pattern: Step-through pattern;Decreased stride length Stairs: No Wheelchair Mobility Wheelchair Mobility: No Modified Rankin (Stroke Patients Only) Pre-Morbid Rankin Score: Slight disability Modified Rankin: Moderately severe disability    Exercises     PT Diagnosis:    PT Problem List:   PT Treatment Interventions:     PT Goals Acute Rehab PT Goals Time For Goal Achievement: 09/07/11 PT Goal: Sit to Stand - Progress: Progressing toward goal PT Goal: Stand  to Sit - Progress: Progressing toward goal PT Goal: Ambulate - Progress: Progressing toward goal  Visit Information  Last PT Received On: 08/29/11 Assistance Needed: +1    Subjective Data  Subjective: I'm ok.     Cognition  Overall Cognitive Status: Impaired Area of Impairment: Attention;Following commands Behavior During Session: Flat affect Current Attention Level: Sustained;Focused Attention - Other Comments: Difficulty to tell if attention was impaired vs. motor planning, vs hypotension Following Commands: Follows one step commands inconsistently Safety/Judgement: Decreased safety judgement for tasks assessed;Decreased awareness of need for assistance;Impulsive Problem Solving: Pt. does not initate activity or conversation    Balance  Balance Balance Assessed: Yes Static Standing Balance Static Standing - Balance Support: Bilateral upper extremity supported Static Standing - Level of Assistance: 5: Stand by assistance  End of Session PT - End of Session Equipment Utilized During Treatment: Gait belt Activity Tolerance: Patient tolerated treatment well Patient left: in chair;with call bell/phone within reach Nurse Communication: Mobility status    Sunny Schlein, [place] [phone] 08/29/2011, 12:53 PM

## 2011-08-29 NOTE — Progress Notes (Signed)
Stroke Team Progress Note  HISTORY Brian Mclaughlin is an 66 y.o. male who was found by his girlfriend on 08/22/11 in the floor holding a broom in his own stool and urine. The girlfriend helped him to bed but found him in his stool and urine again today. Patient was felt to have right sided weakness. He has been numb on the right for 4 days. Patient is amnestic of these events. After being found again today the patient was brought in for evaluation. He feels that he has improved somewhat but continues to have right sided complaints. Patient was not a tPA candidate secondary to presenting beyond the window. He was admitted for further workup.  SUBJECTIVE Patient with hypotensive episode yesterday while up in the halls. Resident notified Dr. Pearlean Brownie who directed workup. Today, patient states back to his baseline. No complaints. No family at bedside.   OBJECTIVE Most recent Vital Signs: Filed Vitals:   08/28/11 2200 08/29/11 0243 08/29/11 0500 08/29/11 0947  BP: 143/85 135/89 115/83 132/93  Pulse: 59 70 70 70  Temp: 98.3 F (36.8 C) 98.3 F (36.8 C) 98.4 F (36.9 C) 98.4 F (36.9 C)  TempSrc: Oral Oral Oral Oral  Resp: 20 18 20 20   Height:      Weight:   97.932 kg (215 lb 14.4 oz)   SpO2: 97% 99% 98% 98%   CBG (last 3)  No results found for this basename: GLUCAP:3 in the last 72 hours Intake/Output from previous day: 06/13 0701 - 06/14 0700 In: -  Out: 675 [Urine:675]    . sodium chloride 75 mL/hr at 08/28/11 1015   MEDICATIONS    . folic acid  1 mg Oral Daily  . multivitamin with minerals  1 tablet Oral Daily  . rivaroxaban  20 mg Oral Daily  . sodium chloride  1,000 mL Intravenous Once  . thiamine  100 mg Oral Daily  . DISCONTD: amLODipine  10 mg Oral Daily  . DISCONTD: carvedilol  12.5 mg Oral BID WC  . DISCONTD: carvedilol  25 mg Oral BID WC   PRN:  acetaminophen, acetaminophen  Diet:  Cardiac thin liquids Activity:   Up with assistance DVT Prophylaxis:  SCD  CLINICALLY  SIGNIFICANT STUDIES Basic Metabolic Panel:  Lab 08/29/11 1610 08/28/11 0615  NA 133* 134*  K 4.4 4.1  CL 101 98  CO2 22 23  GLUCOSE 94 91  BUN 28* 26*  CREATININE 1.59* 1.49*  CALCIUM 8.8 9.2  MG -- --  PHOS -- --   Liver Function Tests:  Lab 08/23/11 1202  AST 152*  ALT 48  ALKPHOS 92  BILITOT 2.0*  PROT 7.5  ALBUMIN 3.8   CBC:  Lab 08/29/11 0630 08/28/11 2014 08/23/11 1202  WBC 12.9* 14.2* --  NEUTROABS -- -- 7.0  HGB 14.1 14.3 --  HCT 40.1 40.2 --  MCV 101.8* 101.5* --  PLT 94* 103* --   Coagulation:  Lab 08/23/11 1202  LABPROT 13.7  INR 1.03   Cardiac Enzymes:  Lab 08/27/11 1105 08/24/11 0540 08/23/11 1932 08/23/11 1210  CKTOTAL 248* 2039* 2703* --  CKMB -- 7.3* 9.1* 13.1*  CKMBINDEX -- -- -- --  TROPONINI -- <0.30 <0.30 <0.30   Urinalysis:  Lab 08/27/11 2000 08/23/11 1620  COLORURINE YELLOW AMBER*  LABSPEC 1.017 1.033*  PHURINE 6.5 5.5  GLUCOSEU NEGATIVE NEGATIVE  HGBUR MODERATE* TRACE*  BILIRUBINUR NEGATIVE MODERATE*  KETONESUR NEGATIVE 40*  PROTEINUR 30* 30*  UROBILINOGEN 1.0 1.0  NITRITE NEGATIVE NEGATIVE  LEUKOCYTESUR NEGATIVE SMALL*   Lipid Panel    Component Value Date/Time   CHOL 147 08/24/2011 0540   TRIG 64 08/24/2011 0540   HDL 59 08/24/2011 0540   CHOLHDL 2.5 08/24/2011 0540   VLDL 13 08/24/2011 0540   LDLCALC 75 08/24/2011 0540   HgbA1C  Lab Results  Component Value Date   HGBA1C 5.3 08/23/2011   Urine Drug Screen:     Component Value Date/Time   LABOPIA NONE DETECTED 08/23/2011 1620   COCAINSCRNUR NONE DETECTED 08/23/2011 1620   LABBENZ NONE DETECTED 08/23/2011 1620   AMPHETMU NONE DETECTED 08/23/2011 1620   THCU NONE DETECTED 08/23/2011 1620   LABBARB NONE DETECTED 08/23/2011 1620    Alcohol Level:  Lab 08/23/11 1202  ETH <11   CT of the brain  08/28/2011 Evolutionary changes of the anterior medial left frontal infarct. Stable atrophy and microvascular ischemic changes. No acute intracranial hemorrhage or mass effect 08/23/2011 Low density  in the medial left frontal lobe is suggestive for an infarct. Age of the infarct is uncertain but could be subacute. No evidence for acute hemorrhage. 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  Atrophy and small vessel disease.  MRA of the brain  08/23/2011  Suspected 50% stenosis right ICA cavernous segment.  No ACA occlusion is observed, but proximal 50% stenosis  A2 segment left ACA could contribute to the observed pattern of infarction.   2D Echocardiogram  EF 25-30% with no source of embolus.  Carotid Doppler  No internal carotid artery stenosis bilaterally. Vertebrals with antegrade flow bilaterally.  CXR  No active disease. Mild degenerative changes thoracic spine.  EKG  atrial fibrillation, rate 78.   Therapy Recommendations PT -hh, OT -HH  Physical Exam  Middle aged Caucasian male currently not in distress. Awake alert. Afebrile. Head is nontraumatic. Neck is supple without bruit. Hearing is normal. Cardiac exam no murmur or gallop. Lungs are clear to auscultation. Distal pulses are well felt.  Neurological exam   Awake  Alert oriented x 3. Normal speech and language.eye movements full without nystagmus.normal acuity and visual fields. Fundi were not visualized. Face symmetric. Tongue midline. Normal strength, tone, reflexes and coordination. Normal sensation. Gait deferred.   ASSESSMENT [name] is a 66 y.o. 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 Infarct secondary to atrial fibrillation but given his presentation may have had a seizure at onset with Todds Paralysis.  Neuro worsening yesterday related to hypotension, no change on CT or with neuro status today. On no known medications  prior to  admission. Now on xarelto for secondary stroke prevention. Patient with no resultant symptoms.  -hypertension -alcohol use -tobacco use -elevated LFTs, therefore xarelto. -cardiomyopathy, unknown source, may be related to polysubstance abuse. -TEE not indicated as patient is already on xarelto.  Hospital day # 6  TREATMENT/PLAN -continue Xarelto for secondary stroke prevention. -Stroke Service will sign off. Follow up with Dr. Pearlean Brownie in 2 months.  Joaquin Music, ANP-BC, GNP-BC [facility] Pager: [phone] 08/29/2011 10:17 AM  Scribe for Dr. Delia Heady, Stroke Center Medical Director. He has personally reviewed chart, pertinent data, examined the patient and developed the plan of care. Pager:  [phone]

## 2011-08-30 LAB — CBC
HCT: 38.7 % — ABNORMAL LOW (ref 39.0–52.0)
Hemoglobin: 13.3 g/dL (ref 13.0–17.0)
MCH: 35 pg — ABNORMAL HIGH (ref 26.0–34.0)
MCHC: 34.4 g/dL (ref 30.0–36.0)
MCV: 101.8 fL — ABNORMAL HIGH (ref 78.0–100.0)
RDW: 12.2 % (ref 11.5–15.5)

## 2011-08-30 LAB — BASIC METABOLIC PANEL
BUN: 27 mg/dL — ABNORMAL HIGH (ref 6–23)
Calcium: 8.7 mg/dL (ref 8.4–10.5)
Creatinine, Ser: 1.61 mg/dL — ABNORMAL HIGH (ref 0.50–1.35)
GFR calc Af Amer: 50 mL/min — ABNORMAL LOW (ref >90)
GFR calc non Af Amer: 43 mL/min — ABNORMAL LOW (ref >90)
Glucose, Bld: 93 mg/dL (ref 70–99)
Potassium: 4.2 mEq/L (ref 3.5–5.1)

## 2011-08-30 MED ORDER — CARVEDILOL 6.25 MG PO TABS
6.2500 mg | ORAL_TABLET | Freq: Two times a day (BID) | ORAL | Status: DC
  Administered 2011-08-30 – 2011-09-01 (×4): 6.25 mg via ORAL
  Filled 2011-08-30 (×7): qty 1

## 2011-08-30 NOTE — Progress Notes (Signed)
Bladder scan completed, .  Patient denies abdomen discomfort, or difficulty with urination.  Will continue to monitor.  Osvaldo Angst, RN----------------------

## 2011-08-30 NOTE — Progress Notes (Signed)
B/P 146/101, MD notified.  No new orders at this time.  Will continue to monitor.  Osvaldo Angst, RN-----------------

## 2011-08-30 NOTE — Progress Notes (Addendum)
Subjective: No events overnight. Patient denies any chest pain, shortness of breath, abdominal pain. Patient's able to answer question and  follow commands.  Objective: Vital signs in last 24 hours: Filed Vitals:   08/29/11 2235 08/30/11 0200 08/30/11 0559 08/30/11 0606  BP: 146/94 127/88 148/74 147/98  Pulse: 84 84 93 69  Temp: 98.6 F (37 C) 97.5 F (36.4 C) 98.8 F (37.1 C) 98.1 F (36.7 C)  TempSrc: Oral Oral Oral Oral  Resp: 20 18  20   Height:      Weight:      SpO2: 96% 95% 95% 97%   Weight change:   Intake/Output Summary (Last 24 hours) at 08/30/11 0659 Last data filed at 08/30/11 0534  Gross per 24 hour  Intake     75 ml  Output    750 ml  Net   -675 ml   Physical Exam: Constitutional: Vital signs reviewed.  Patient is a well-developed and well-nourished  in no acute distress and cooperative with exam. Alert and oriented x3.  Neck: Supple,  Cardiovascular: Irregular Irregular, S1 normal, S2 normal, no MRG, pulses symmetric and intact bilaterally Pulmonary/Chest: CTAB, no wheezes, rales, or rhonchi Abdominal: Soft. Non-tender, non-distended, bowel sounds are normal,   Neurological: A&O x3, Sensory: normal Motor: R arm 3-4/5, L arm 5/5. R leg 3/5, L leg 5/5 Reflexes: 1+ UE, absent LE  Lab Results: Basic Metabolic Panel:  Lab 08/29/11 1610 08/28/11 0615  NA 133* 134*  K 4.4 4.1  CL 101 98  CO2 22 23  GLUCOSE 94 91  BUN 28* 26*  CREATININE 1.59* 1.49*  CALCIUM 8.8 9.2  MG -- --  PHOS -- --   Liver Function Tests:  Lab 08/23/11 1202  AST 152*  ALT 48  ALKPHOS 92  BILITOT 2.0*  PROT 7.5  ALBUMIN 3.8    Lab 08/23/11 1203  AMMONIA 16   CBC:  Lab 08/29/11 0630 08/28/11 2014 08/23/11 1202  WBC 12.9* 14.2* --  NEUTROABS -- -- 7.0  HGB 14.1 14.3 --  HCT 40.1 40.2 --  MCV 101.8* 101.5* --  PLT 94* 103* --   Cardiac Enzymes:  Lab 08/27/11 1105 08/24/11 0540 08/23/11 1932 08/23/11 1210  CKTOTAL 248* 2039* 2703* --  CKMB -- 7.3* 9.1* 13.1*    CKMBINDEX -- -- -- --  TROPONINI -- <0.30 <0.30 <0.30   Hemoglobin A1C:  Lab 08/23/11 1542  HGBA1C 5.3   Fasting Lipid Panel:  Lab 08/24/11 0540  CHOL 147  HDL 59  LDLCALC 75  TRIG 64  CHOLHDL 2.5  LDLDIRECT --   Thyroid Function Tests:  Lab 08/23/11 1542  TSH 1.869  T4TOTAL --  FREET4 --  T3FREE --  THYROIDAB --   Coagulation:  Lab 08/23/11 1202  LABPROT 13.7  INR 1.03   Anemia Panel:  Lab 08/23/11 1542  VITAMINB12 479  FOLATE --  FERRITIN --  TIBC --  IRON --  RETICCTPCT --   Urine Drug Screen: Drugs of Abuse     Component Value Date/Time   LABOPIA NONE DETECTED 08/23/2011 1620   COCAINSCRNUR NONE DETECTED 08/23/2011 1620   LABBENZ NONE DETECTED 08/23/2011 1620   AMPHETMU NONE DETECTED 08/23/2011 1620   THCU NONE DETECTED 08/23/2011 1620   LABBARB NONE DETECTED 08/23/2011 1620    Alcohol Level:  Lab 08/23/11 1202  ETH <11   Urinalysis:  Lab 08/27/11 2000 08/23/11 1620  COLORURINE YELLOW AMBER*  LABSPEC 1.017 1.033*  PHURINE 6.5 5.5  GLUCOSEU NEGATIVE NEGATIVE  HGBUR MODERATE* TRACE*  BILIRUBINUR NEGATIVE MODERATE*  KETONESUR NEGATIVE 40*  PROTEINUR 30* 30*  UROBILINOGEN 1.0 1.0  NITRITE NEGATIVE NEGATIVE  LEUKOCYTESUR NEGATIVE SMALL*     Micro Results: No results found for this or any previous visit (from the past 240 hour(s)). Studies/Results: Ct Abdomen Pelvis Wo Contrast  08/28/2011  *RADIOLOGY REPORT*  Clinical Data: Bilateral flank pain.  Question kidney stone.  CT ABDOMEN AND PELVIS WITHOUT CONTRAST  Technique:  Multidetector CT imaging of the abdomen and pelvis was performed following the standard protocol without intravenous contrast.  Comparison: None.  Findings: The lung bases are clear.  There is no pleural effusion. There is a small hiatal hernia.  There is asymmetric perinephric soft tissue stranding on the left, especially superiorly.  No renal or ureteral calculi are demonstrated.  There is no significant hydronephrosis.  There is  no evidence of bladder calculus.  No focal renal abnormalities are seen.  As evaluated in the noncontrast state, the liver, spleen, pancreas and adrenal glands appear unremarkable.  The gallbladder is contracted without surrounding inflammation.  The bowel gas pattern is normal.  The stomach is poorly distended. The appendix appears normal.  There is mild diffuse aorto iliac atherosclerosis.  No other focal inflammatory changes are identified.  The prostate gland and seminal vesicles appear unremarkable for age.  There are mild degenerative changes of the lower lumbar spine. There are nondisplaced fractures of the right ninth and tenth ribs posteriorly which appear acute or subacute.  No other fractures are identified.  There are bilateral femoral neck synovial herniation pits.  IMPRESSION:  1.  Nonspecific asymmetric perinephric soft tissue stranding on the left.  This could be secondary to a recently passed ureteral calculus or inflammation.  Correlation with urine analysis recommended. 2.  No evidence of urinary tract calculus or hydronephrosis. 3.  Acute or subacute nondisplaced fractures of the right ninth and tenth ribs posteriorly.  Original Report Authenticated By: Gerrianne Scale, M.D.   Ct Head Wo Contrast  08/28/2011  *RADIOLOGY REPORT*  Clinical Data: Acute left frontal infarct, syncopal episode  CT HEAD WITHOUT CONTRAST  Technique:  Contiguous axial images were obtained from the base of the skull through the vertex without contrast.  Comparison: 08/23/2011  Findings: Brain atrophy noted with extensive microvascular ischemic changes in the periventricular white matter.  Remote right caudate nucleus lacunar type infarct.  Evolutionary changes of a medial anterior frontal infarction predominately in the subcortical white matter.  No interval acute hemorrhage, mass effect, herniation, midline shift, hydrocephalus, or extra-axial fluid collection. Cisterns patent.  Cerebellar atrophy as well.   Atherosclerosis of the intracranial vessels.  IMPRESSION: Evolutionary changes of the anterior medial left frontal infarct. Stable atrophy and microvascular ischemic changes.  No acute intracranial hemorrhage or mass effect  Original Report Authenticated By: Judie Petit. Ruel Favors, M.D.   Medications: I have reviewed the patient's current medications. Scheduled Meds:   . folic acid  1 mg Oral Daily  . multivitamin with minerals  1 tablet Oral Daily  . rivaroxaban  20 mg Oral Daily  . thiamine  100 mg Oral Daily  . DISCONTD: metoprolol tartrate  12.5 mg Oral BID   Continuous Infusions:   . DISCONTD: sodium chloride 75 mL/hr (08/30/11 0539)   PRN Meds:.acetaminophen, acetaminophen Assessment/Plan:  # Stroke - in the setting of uncontrolled Hypertension, Smoking and alcohol abuse. MRI  On 6/8 showed Acute left frontal parasagittal, posterior frontal parasagittal, left parietal parasagittal infarctions without hemorrhage. Atrophy and small vessel  disease. Repeat CT  On 6/13 showed Evolutionary changes of the anterior medial left frontal infarct. Stable atrophy and microvascular ischemic changes. Currently on Xarelto  - Continue current regimen.  - PT/OT - Appreciate neurology consult in the management of this patient. Per neurology, TEE is not necessary since the results will not change the management.   Atrial fib - etiology is not clear currently. Beta blocker was held due to episode of Hypotension - Will restart Coreg 6.25 mg bid today  - appreciate cariology help in magaging our patient. will follow up for recommendations.  - will continue xarelto for secondary stroke prevention given atrial fibrillation. Patient is heavy drinker and has high risk for fall. He is not good candidate for coumadin.   Hypertension - now elevated . Patient had episode of Hypotension unclear etiology .  - Will restart Coreg 6.25 mg bid ( patient was given Coreg 25 mg bid until 6/13 -will resume further Bp  medications if bp continues to be elevated.   Rhabdomyolysis - improving.   - Will obtain another CK today. - lipitor on hold in setting of rhabdo May consider to start as an outpatient.   Acute renal failure - Improving . Crea 1.6<1.5<1.4.  Most likely caused by rhabod and volume depletion with a FeNa <1 on admission. At this point concerning for underlying chronic renal and disease in the setting of uncontrolled hypertension. CT abdomen and pelvis without contrast on 6/13 showed There is asymmetric perinephric soft tissue stranding on the left,  especially superiorly. No renal or ureteral calculi are  demonstrated. There is no significant hydronephrosis. There is no evidence of bladder calculus. No focal renal abnormalities are seen. There was a concern lisinopril cause an increase of his creatinine during this hospitalization but highly unlikely. - Patient was given 75 cc per hour for 24 hours. Craddock and has not improved. - We'll continue to monitor -Will hold lisinopril   Acute or chronic systolic congestive heart failure with an EF of 25-30%. Diffuse hypokinesis noted. Patient needs outpatient workup for further evaluation and management. - Will restart Coreg 6.25 mg twice a day -Holding ACE inhibitors in the setting of unclear renal function.  Alcohol abuse - Patient reports 6-8 beers daily, though could be more . Will monitor him for s/s withdrawal. His ETOH level <11 on admit. His LFT's are abnormal with AST > (ALT x 2).  - CIWA protocol  - folic acid and thiamine  - consulted SW   Tobacco abuse - Patient has a h/o 1/2 PPD x 45 years. In setting of stroke, HTN, and acute nature of symptoms will hold off on nicotine replacement for now. - Patient is not yet ready to quit but with thing to do it in the future or eventually. Patient has not needed nicotine patches during this hospitalization.  - smoking cessation consulted   LOS: 7 days   Pradyun Ishman 08/30/2011, 6:59 AM

## 2011-08-31 LAB — BASIC METABOLIC PANEL
BUN: 26 mg/dL — ABNORMAL HIGH (ref 6–23)
Calcium: 9 mg/dL (ref 8.4–10.5)
Chloride: 101 mEq/L (ref 96–112)
Creatinine, Ser: 1.62 mg/dL — ABNORMAL HIGH (ref 0.50–1.35)
GFR calc Af Amer: 49 mL/min — ABNORMAL LOW (ref >90)

## 2011-08-31 LAB — CBC
HCT: 37.5 % — ABNORMAL LOW (ref 39.0–52.0)
MCH: 35.8 pg — ABNORMAL HIGH (ref 26.0–34.0)
MCHC: 35.5 g/dL (ref 30.0–36.0)
MCV: 101.1 fL — ABNORMAL HIGH (ref 78.0–100.0)
Platelets: 124 10*3/uL — ABNORMAL LOW (ref 150–400)
RDW: 12 % (ref 11.5–15.5)
WBC: 9 10*3/uL (ref 4.0–10.5)

## 2011-08-31 NOTE — Progress Notes (Signed)
Subjective:  No events overnight. His left flank pain has completely resolved. Patient denies any chest pain, shortness of breath, abdominal pain. Patient's able to answer question and  follow commands.   Objective: Vital signs in last 24 hours: Filed Vitals:   08/30/11 2124 08/31/11 0324 08/31/11 0345 08/31/11 0653  BP: 143/89 167/126 147/94 127/69  Pulse: 66 88 73 69  Temp: 99 F (37.2 C) 98.3 F (36.8 C)  98.6 F (37 C)  TempSrc: Oral Oral  Oral  Resp: 20 20  20   Height:      Weight:      SpO2: 94% 97%  96%   Weight change:   Intake/Output Summary (Last 24 hours) at 08/31/11 0721 Last data filed at 08/31/11 0600  Gross per 24 hour  Intake      0 ml  Output   1590 ml  Net  -1590 ml   Physical Exam: Constitutional: Vital signs reviewed.  Patient is a well-developed and well-nourished  in no acute distress and cooperative with exam. Alert and oriented x3.  Neck: Supple,  Cardiovascular: Irregular Irregular, S1 normal, S2 normal, no MRG, pulses symmetric and intact bilaterally Pulmonary/Chest: CTAB, no wheezes, rales, or rhonchi Abdominal: Soft. Non-tender, non-distended, bowel sounds are normal,   Neurological: A&O x3, Sensory: normal Motor: R arm 4/5, L arm 5/5. R leg 4/5, L leg 5/5,   Lab Results: Basic Metabolic Panel:  Lab 08/30/11 1610 08/29/11 0630  NA 133* 133*  K 4.2 4.4  CL 102 101  CO2 23 22  GLUCOSE 93 94  BUN 27* 28*  CREATININE 1.61* 1.59*  CALCIUM 8.7 8.8  MG -- --  PHOS -- --   Liver Function Tests: No results found for this basename: AST:2,ALT:2,ALKPHOS:2,BILITOT:2,PROT:2,ALBUMIN:2 in the last 168 hours No results found for this basename: AMMONIA:2 in the last 168 hours CBC:  Lab 08/30/11 0625 08/29/11 0630  WBC 11.1* 12.9*  NEUTROABS -- --  HGB 13.3 14.1  HCT 38.7* 40.1  MCV 101.8* 101.8*  PLT 105* 94*   Cardiac Enzymes:  Lab 08/30/11 0625 08/27/11 1105  CKTOTAL 61 248*  CKMB -- --  CKMBINDEX -- --  TROPONINI -- --   Hemoglobin  A1C: No results found for this basename: HGBA1C in the last 168 hours Fasting Lipid Panel: No results found for this basename: CHOL,HDL,LDLCALC,TRIG,CHOLHDL,LDLDIRECT in the last 960 hours Thyroid Function Tests: No results found for this basename: TSH,T4TOTAL,FREET4,T3FREE,THYROIDAB in the last 168 hours Coagulation: No results found for this basename: LABPROT:4,INR:4 in the last 168 hours Anemia Panel: No results found for this basename: VITAMINB12,FOLATE,FERRITIN,TIBC,IRON,RETICCTPCT in the last 168 hours Urine Drug Screen: Drugs of Abuse     Component Value Date/Time   LABOPIA NONE DETECTED 08/23/2011 1620   COCAINSCRNUR NONE DETECTED 08/23/2011 1620   LABBENZ NONE DETECTED 08/23/2011 1620   AMPHETMU NONE DETECTED 08/23/2011 1620   THCU NONE DETECTED 08/23/2011 1620   LABBARB NONE DETECTED 08/23/2011 1620    Alcohol Level: No results found for this basename: ETH:2 in the last 168 hours Urinalysis:  Lab 08/27/11 2000  COLORURINE YELLOW  LABSPEC 1.017  PHURINE 6.5  GLUCOSEU NEGATIVE  HGBUR MODERATE*  BILIRUBINUR NEGATIVE  KETONESUR NEGATIVE  PROTEINUR 30*  UROBILINOGEN 1.0  NITRITE NEGATIVE  LEUKOCYTESUR NEGATIVE     Micro Results: No results found for this or any previous visit (from the past 240 hour(s)). Studies/Results: No results found. Medications: I have reviewed the patient's current medications. Scheduled Meds:    . carvedilol  6.25 mg  Oral BID WC  . folic acid  1 mg Oral Daily  . multivitamin with minerals  1 tablet Oral Daily  . rivaroxaban  20 mg Oral Daily  . thiamine  100 mg Oral Daily   Continuous Infusions:  PRN Meds:.acetaminophen, acetaminophen Assessment/Plan:  # Stroke - In the setting of uncontrolled Hypertension, Smoking and alcohol abuse. MRI  On 6/8 showed Acute left frontal parasagittal, posterior frontal parasagittal, left parietal parasagittal infarctions without hemorrhage. Atrophy and small vessel disease. Repeat CT  On 6/13 showed  Evolutionary changes of the anterior medial left frontal infarct. Stable atrophy and microvascular ischemic changes. Currently on Xarelto.  - Continue current regimen.  - PT/OT - Appreciate neurology consult in the management of this patient. Per neurology, TEE is not necessary since the results will not change the management.    Atrial fib - etiology is not clear currently. Beta blocker was held due to episode of Hypotension, but restarted coreg on 6/15 - Will restart Coreg 6.25 mg bid today  - appreciate cariology help in magaging our patient. will follow up for recommendations.  - will continue xarelto for secondary stroke prevention given atrial fibrillation. Patient is heavy drinker and has high risk for fall. He is not good candidate for coumadin.   Hypertension - 122/69. His blood pressure is fluctuating which may be due to autonomic dysfunction 2/2 stroke. Patient should not be treated too aggressively in order to avoid lowering his blood pressure too much.  - Continue  Coreg 6.25 mg bid. - will resume further Bp medications if bp continues to be elevated.   Rhabdomyolysis -CK became normal on 6/15. - Will obtain another CK today. - lipitor on hold in setting of rhabdo.  May consider to start as an outpatient.   Acute renal failure - Improving . Crea 1.6<1.5<1.4.  Most likely caused by rhabod and volume depletion with a FeNa <1 on admission. At this point concerning for underlying chronic renal and disease in the setting of uncontrolled hypertension. CT abdomen and pelvis without contrast on 6/13 showed There is asymmetric perinephric soft tissue stranding on the left,  especially superiorly. No renal or ureteral calculi are  demonstrated. There is no significant hydronephrosis. There is no evidence of bladder calculus. No focal renal abnormalities are seen. There was a concern lisinopril cause an increase of his creatinine during this hospitalization but highly unlikely.  - Patient  was given 75 cc per hour for 24 hours. Craddock and has not improved. 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 Diffuse hypokinesis noted. Etiology is not clear. 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 Patient needs outpatient workup for further evaluation and management.  - will continue  Coreg 6.25 mg twice a day -Holding ACE inhibitors in the setting of unclear renal function.  Alcohol abuse - Patient reports 6-8 beers daily, though could be more . Will monitor him for s/s withdrawal. His ETOH level <11 on admit. His LFT's are abnormal with AST > (ALT x 2).  - CIWA protocol  - folic acid and thiamine  - consulted SW   Tobacco abuse - Patient has a h/o 1/2 PPD x 45 years. In setting of stroke, HTN, and acute nature of symptoms will hold off on nicotine replacement for now. - Patient is not yet ready to quit but with thing to do it in the future or eventually. Patient has not needed nicotine patches  during this hospitalization.  - smoking cessation consulted   LOS: 8 days   Lorretta Harp 08/31/2011, 7:21 AM

## 2011-09-01 MED ORDER — THIAMINE HCL 100 MG PO TABS: 100.0000 mg | ORAL_TABLET | Freq: Every day | ORAL | Status: DC

## 2011-09-01 MED ORDER — FOLIC ACID 1 MG PO TABS: 1.0000 mg | ORAL_TABLET | Freq: Every day | ORAL | Status: DC

## 2011-09-01 MED ORDER — RIVAROXABAN 20 MG PO TABS: 20.0000 mg | ORAL_TABLET | Freq: Every day | ORAL | Status: DC

## 2011-09-01 MED ORDER — ADULT MULTIVITAMIN W/MINERALS CH: 1.0000 | ORAL_TABLET | Freq: Every day | ORAL | Status: DC

## 2011-09-01 MED ORDER — CARVEDILOL 6.25 MG PO TABS: 6.2500 mg | ORAL_TABLET | Freq: Two times a day (BID) | ORAL | Status: DC

## 2011-09-01 NOTE — Progress Notes (Signed)
Pt d/c to SNF. Assessment stable. Attempted to call report to [facility], was told receiving RN was at lunch and will call back for report when she returns.

## 2011-09-01 NOTE — Progress Notes (Signed)
Subjective:  No events overnight. He is afebrile. His left flank pain has completely resolved. Patient denies any chest pain, shortness of breath, abdominal pain.   Objective: Vital signs in last 24 hours: Filed Vitals:   08/31/11 2300 09/01/11 0200 09/01/11 0423 09/01/11 0600  BP: 135/80 138/93  137/94  Pulse: 77 72  74  Temp: 99 F (37.2 C) 98.2 F (36.8 C)  98.1 F (36.7 C)  TempSrc: Oral Oral  Oral  Resp: 18 18  18   Height:      Weight:   214 lb 14.4 oz (97.478 kg)   SpO2:  95%  98%   Weight change:   Intake/Output Summary (Last 24 hours) at 09/01/11 1053 Last data filed at 09/01/11 0600  Gross per 24 hour  Intake    480 ml  Output   1250 ml  Net   -770 ml   Physical Exam: Constitutional: Vital signs reviewed.  Patient is a well-developed and well-nourished  in no acute distress and cooperative with exam. Alert and oriented x3.  Neck: Supple,  Cardiovascular: Irregular Irregular, S1 normal, S2 normal, no MRG, pulses symmetric and intact bilaterally Pulmonary/Chest: CTAB, no wheezes, rales, or rhonchi Abdominal: Soft. Non-tender, non-distended, bowel sounds are normal,   Neurological: A&O x3, Sensory: normal Motor: R arm 4/5, L arm 5/5. R leg 4/5, L leg 5/5,   Lab Results: Basic Metabolic Panel:  Lab 08/31/11 1610 08/30/11 0625  NA 134* 133*  K 4.0 4.2  CL 101 102  CO2 23 23  GLUCOSE 92 93  BUN 26* 27*  CREATININE 1.62* 1.61*  CALCIUM 9.0 8.7  MG -- --  PHOS -- --   Liver Function Tests: No results found for this basename: AST:2,ALT:2,ALKPHOS:2,BILITOT:2,PROT:2,ALBUMIN:2 in the last 168 hours No results found for this basename: AMMONIA:2 in the last 168 hours CBC:  Lab 08/31/11 0653 08/30/11 0625  WBC 9.0 11.1*  NEUTROABS -- --  HGB 13.3 13.3  HCT 37.5* 38.7*  MCV 101.1* 101.8*  PLT 124* 105*   Cardiac Enzymes:  Lab 08/30/11 0625 08/27/11 1105  CKTOTAL 61 248*  CKMB -- --  CKMBINDEX -- --  TROPONINI -- --   Hemoglobin A1C: No results found  for this basename: HGBA1C in the last 168 hours Fasting Lipid Panel: No results found for this basename: CHOL,HDL,LDLCALC,TRIG,CHOLHDL,LDLDIRECT in the last 960 hours Thyroid Function Tests: No results found for this basename: TSH,T4TOTAL,FREET4,T3FREE,THYROIDAB in the last 168 hours Coagulation: No results found for this basename: LABPROT:4,INR:4 in the last 168 hours Anemia Panel: No results found for this basename: VITAMINB12,FOLATE,FERRITIN,TIBC,IRON,RETICCTPCT in the last 168 hours Urine Drug Screen: Drugs of Abuse     Component Value Date/Time   LABOPIA NONE DETECTED 08/23/2011 1620   COCAINSCRNUR NONE DETECTED 08/23/2011 1620   LABBENZ NONE DETECTED 08/23/2011 1620   AMPHETMU NONE DETECTED 08/23/2011 1620   THCU NONE DETECTED 08/23/2011 1620   LABBARB NONE DETECTED 08/23/2011 1620    Alcohol Level: No results found for this basename: ETH:2 in the last 168 hours Urinalysis:  Lab 08/27/11 2000  COLORURINE YELLOW  LABSPEC 1.017  PHURINE 6.5  GLUCOSEU NEGATIVE  HGBUR MODERATE*  BILIRUBINUR NEGATIVE  KETONESUR NEGATIVE  PROTEINUR 30*  UROBILINOGEN 1.0  NITRITE NEGATIVE  LEUKOCYTESUR NEGATIVE   Scheduled Meds:    . carvedilol  6.25 mg Oral BID WC  . folic acid  1 mg Oral Daily  . multivitamin with minerals  1 tablet Oral Daily  . rivaroxaban  20 mg Oral Daily  .  thiamine  100 mg Oral Daily   Continuous Infusions:  PRN Meds:.acetaminophen, acetaminophen Assessment/Plan:  # Stroke - In the setting of uncontrolled Hypertension, Smoking and alcohol abuse. MRI  On 6/8 showed Acute left frontal parasagittal, posterior frontal parasagittal, left parietal parasagittal infarctions without hemorrhage. Atrophy and small vessel disease. Repeat CT  On 6/13 showed Evolutionary changes of the anterior medial left frontal infarct. Stable atrophy and microvascular ischemic changes. Currently on Xarelto.  - Continue current regimen.  - PT/OT - Appreciate neurology consult in the management  of this patient. Per neurology, TEE is not necessary since the results will not change the management.    Atrial fib - etiology is not clear currently. Beta blocker was held due to episode of Hypotension, but restarted coreg on 6/15  - Will continue Coreg 6.25 mg bid today  - appreciate cariology help in magaging our patient.  - will continue xarelto for secondary stroke prevention given atrial fibrillation. Patient is heavy drinker and has high risk for fall. He is not good candidate for coumadin.   Hypertension - 137/94. His blood pressure is fluctuating which may be due to autonomic dysfunction 2/2 stroke. Patient should not be treated too aggressively in order to avoid lowering his blood pressure too much.  - Continue  Coreg 6.25 mg bid. - will resume further Bp medications if bp continues to be elevated.   Rhabdomyolysis -CK became normal on 6/15. - lipitor on hold in setting of rhabdo.  May consider to start as an outpatient.   Acute renal failure - Improving . Crea 1.6<1.5<1.4.  Most likely caused by rhabod and volume depletion with a FeNa <1 on admission. At this point concerning for underlying chronic renal and disease in the setting of uncontrolled hypertension. CT abdomen and pelvis without contrast on 6/13 showed There is asymmetric perinephric soft tissue stranding on the left,  especially superiorly. No renal or ureteral calculi are  demonstrated. There is no significant hydronephrosis. There is no evidence of bladder calculus. No focal renal abnormalities are seen. There was a concern lisinopril cause an increase of his creatinine during this hospitalization but highly unlikely.  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 Diffuse hypokinesis noted. Etiology is not clear. 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 Patient needs outpatient workup for further evaluation and management.  - will  continue  Coreg 6.25 mg twice a day -Holding ACE inhibitors in the setting of unclear renal function.  Alcohol abuse - Patient reports 6-8 beers daily, though could be more . Will monitor him for s/s withdrawal. His ETOH level <11 on admit. His LFT's are abnormal with AST > (ALT x 2).  - CIWA protocol  - folic acid and thiamine  - consulted SW   Tobacco abuse - Patient has a h/o 1/2 PPD x 45 years. In setting of stroke, HTN, and acute nature of symptoms will hold off on nicotine replacement for now. - Patient is not yet ready to quit but with thing to do it in the future or eventually. Patient has not needed nicotine patches during this hospitalization.  - smoking cessation consulted   LOS: 9 days   Lorretta Harp 09/01/2011, 10:53 AM

## 2011-09-01 NOTE — Progress Notes (Signed)
Occupational Therapy Treatment Patient Details Name: Brian Mclaughlin MRN: [id number] DOB: 20-Sep-1945 Today's Date: 09/01/2011 Time: 5784-6962 OT Time Calculation (min): 24 min  OT Assessment / Plan / Recommendation Comments on Treatment Session Pt requiring encouragement to participate in ADL.  Continues to have decreased awareness of deficits and safety/judgement.  Improvement noted in direction following, attention, ability to sequence ADL tasks, and mobility.    Follow Up Recommendations  Skilled nursing facility;Supervision/Assistance - 24 hour    Barriers to Discharge       Equipment Recommendations  Defer to next venue    Recommendations for Other Services    Frequency Min 2X/week   Plan      Precautions / Restrictions Precautions Precautions: Fall Precaution Comments: needs to use RW with all gait Restrictions Weight Bearing Restrictions: No   Pertinent Vitals/Pain No pain    ADL  Grooming: Performed;Wash/dry hands;Brushing hair;Teeth care;Supervision/safety Where Assessed - Grooming: Supported standing Upper Body Dressing: Set up;Performed Where Assessed - Upper Body Dressing: Unsupported sitting Lower Body Dressing: Performed;Supervision/safety Where Assessed - Lower Body Dressing: Sopported sit to stand Toilet Transfer: Simulated;Min guard Toilet Transfer Method: Sit to stand Equipment Used: Rolling walker Transfers/Ambulation Related to ADLs: Pt ambulating with RW and min guard assist.  Pt acknowledges balance deficits, but states he does not like asking for assist to get up. ADL Comments: Able to sequence ADL tasks at sink with set up of needed items.  Pt requiring encouragement to perform ADL.    OT Diagnosis:    OT Problem List:   OT Treatment Interventions:     OT Goals ADL Goals Pt Will Perform Grooming: with supervision;Standing at sink ADL Goal: Grooming - Progress: Met Pt Will Perform Upper Body Dressing: with supervision;Sitting, chair ADL Goal:  Upper Body Dressing - Progress: Met Pt Will Perform Lower Body Dressing: with supervision;Sit to stand from bed;Sit to stand from chair ADL Goal: Lower Body Dressing - Progress: Goal set today Pt Will Transfer to Toilet: with supervision;Ambulation;Comfort height toilet ADL Goal: Toilet Transfer - Progress: Progressing toward goals Additional ADL Goal #1: Pt. will maintain selective attention x 10 mins with min cues during simple ADL task ADL Goal: Additional Goal #1 - Progress: Progressing toward goals  Visit Information  Last OT Received On: 09/01/11 Assistance Needed: +1    Subjective Data      Prior Functioning       Cognition  Overall Cognitive Status: Impaired Area of Impairment: Awareness of deficits;Awareness of errors;Safety/judgement Arousal/Alertness: Awake/alert Orientation Level: Appears intact for tasks assessed Behavior During Session: Other (comment) (impulsivity with ambulation) Current Attention Level: Sustained Safety/Judgement: Decreased safety judgement for tasks assessed;Decreased awareness of need for assistance;Impulsive Awareness of Errors: Assistance required to identify errors made Problem Solving: Pt. does not initate activity or conversation    Mobility Transfers Transfers: Sit to Stand;Stand to Sit Sit to Stand: 5: Supervision;From chair/3-in-1 Stand to Sit: 5: Supervision;To chair/3-in-1   Exercises    Balance    End of Session OT - End of Session Activity Tolerance: Patient tolerated treatment well Patient left: in chair;with call bell/phone within reach   Evern Bio 09/01/2011, 11:47 AM [phone]

## 2011-09-08 ENCOUNTER — Encounter: Payer: Medicare Other | Admitting: Internal Medicine

## 2012-02-02 ENCOUNTER — Emergency Department (HOSPITAL_COMMUNITY): Payer: Medicare Other

## 2012-02-02 ENCOUNTER — Encounter (HOSPITAL_COMMUNITY): Payer: Self-pay | Admitting: Family Medicine

## 2012-02-02 ENCOUNTER — Emergency Department (HOSPITAL_COMMUNITY)
Admission: EM | Admit: 2012-02-02 | Discharge: 2012-02-02 | Disposition: A | Discharge status: Home Health | Payer: Medicare Other | Attending: Emergency Medicine | Admitting: Emergency Medicine

## 2012-02-02 DIAGNOSIS — F10929 Alcohol use, unspecified with intoxication, unspecified: Secondary | ICD-10-CM

## 2012-02-02 DIAGNOSIS — S0001XA Abrasion of scalp, initial encounter: Secondary | ICD-10-CM

## 2012-02-02 DIAGNOSIS — W19XXXA Unspecified fall, initial encounter: Secondary | ICD-10-CM

## 2012-02-02 DIAGNOSIS — I10 Essential (primary) hypertension: Secondary | ICD-10-CM | POA: Insufficient documentation

## 2012-02-02 DIAGNOSIS — S1093XA Contusion of unspecified part of neck, initial encounter: Secondary | ICD-10-CM | POA: Insufficient documentation

## 2012-02-02 DIAGNOSIS — Y92009 Unspecified place in unspecified non-institutional (private) residence as the place of occurrence of the external cause: Secondary | ICD-10-CM | POA: Insufficient documentation

## 2012-02-02 DIAGNOSIS — Z79899 Other long term (current) drug therapy: Secondary | ICD-10-CM | POA: Insufficient documentation

## 2012-02-02 DIAGNOSIS — R112 Nausea with vomiting, unspecified: Secondary | ICD-10-CM | POA: Insufficient documentation

## 2012-02-02 DIAGNOSIS — F1010 Alcohol abuse, uncomplicated: Secondary | ICD-10-CM | POA: Insufficient documentation

## 2012-02-02 DIAGNOSIS — R51 Headache: Secondary | ICD-10-CM | POA: Insufficient documentation

## 2012-02-02 DIAGNOSIS — W108XXA Fall (on) (from) other stairs and steps, initial encounter: Secondary | ICD-10-CM | POA: Insufficient documentation

## 2012-02-02 DIAGNOSIS — S0990XA Unspecified injury of head, initial encounter: Secondary | ICD-10-CM | POA: Insufficient documentation

## 2012-02-02 DIAGNOSIS — IMO0002 Reserved for concepts with insufficient information to code with codable children: Secondary | ICD-10-CM | POA: Insufficient documentation

## 2012-02-02 DIAGNOSIS — S20219A Contusion of unspecified front wall of thorax, initial encounter: Secondary | ICD-10-CM | POA: Insufficient documentation

## 2012-02-02 DIAGNOSIS — Z23 Encounter for immunization: Secondary | ICD-10-CM | POA: Insufficient documentation

## 2012-02-02 DIAGNOSIS — Z7901 Long term (current) use of anticoagulants: Secondary | ICD-10-CM | POA: Insufficient documentation

## 2012-02-02 DIAGNOSIS — R1011 Right upper quadrant pain: Secondary | ICD-10-CM | POA: Insufficient documentation

## 2012-02-02 DIAGNOSIS — I4891 Unspecified atrial fibrillation: Secondary | ICD-10-CM | POA: Insufficient documentation

## 2012-02-02 DIAGNOSIS — F17200 Nicotine dependence, unspecified, uncomplicated: Secondary | ICD-10-CM | POA: Insufficient documentation

## 2012-02-02 DIAGNOSIS — S0003XA Contusion of scalp, initial encounter: Secondary | ICD-10-CM | POA: Insufficient documentation

## 2012-02-02 DIAGNOSIS — Y9389 Activity, other specified: Secondary | ICD-10-CM | POA: Insufficient documentation

## 2012-02-02 DIAGNOSIS — R4182 Altered mental status, unspecified: Secondary | ICD-10-CM | POA: Insufficient documentation

## 2012-02-02 LAB — BASIC METABOLIC PANEL
BUN: 20 mg/dL (ref 6–23)
Calcium: 8.5 mg/dL (ref 8.4–10.5)
GFR calc Af Amer: 54 mL/min — ABNORMAL LOW (ref >90)
GFR calc non Af Amer: 46 mL/min — ABNORMAL LOW (ref >90)
Glucose, Bld: 93 mg/dL (ref 70–99)
Potassium: 4.4 mEq/L (ref 3.5–5.1)
Sodium: 131 mEq/L — ABNORMAL LOW (ref 135–145)

## 2012-02-02 LAB — CBC WITH DIFFERENTIAL/PLATELET
Basophils Relative: 1 % (ref 0–1)
Eosinophils Absolute: 0.2 10*3/uL (ref 0.0–0.7)
Eosinophils Relative: 3 % (ref 0–5)
MCH: 34 pg (ref 26.0–34.0)
MCHC: 34.8 g/dL (ref 30.0–36.0)
MCV: 97.7 fL (ref 78.0–100.0)
Neutrophils Relative %: 44 % (ref 43–77)
Platelets: 112 10*3/uL — ABNORMAL LOW (ref 150–400)

## 2012-02-02 LAB — PROTIME-INR: Prothrombin Time: 13 seconds (ref 11.6–15.2)

## 2012-02-02 MED ORDER — IOHEXOL 300 MG/ML  SOLN
100.0000 mL | Freq: Once | INTRAMUSCULAR | Status: AC | PRN
  Administered 2012-02-02: 100 mL via INTRAVENOUS

## 2012-02-02 MED ORDER — TETANUS-DIPHTH-ACELL PERTUSSIS 5-2.5-18.5 LF-MCG/0.5 IM SUSP
0.5000 mL | Freq: Once | INTRAMUSCULAR | Status: AC
  Administered 2012-02-02: 0.5 mL via INTRAMUSCULAR
  Filled 2012-02-02: qty 0.5

## 2012-02-02 MED ORDER — SODIUM CHLORIDE 0.9 % IV BOLUS (SEPSIS)
1000.0000 mL | Freq: Once | INTRAVENOUS | Status: AC
  Administered 2012-02-02: 1000 mL via INTRAVENOUS

## 2012-02-02 MED ORDER — ACETAMINOPHEN 500 MG PO TABS: 500.0000 mg | ORAL_TABLET | Freq: Four times a day (QID) | ORAL | Status: DC | PRN

## 2012-02-02 NOTE — ED Notes (Signed)
Pt appearing intoxicated. Pt had episode of vomiting. At this time pt is sleeping. Denying any pain. Unable to find a laceration to pt head. Abrasion to back of head. Bleeding controlled. Pt has c-collar in place .Waiting for CT at this time needing BUN/creatine results. No family here for patient at this time.

## 2012-02-02 NOTE — ED Notes (Signed)
Per EMS, pt fell about 20 steps backwards. Pt has admitted to having 6 beers today. Pt on his deck and fell backwards down the stairs. Pt having HA on the back of head along with lac. Active bleeding. Pt on blood thinners.

## 2012-02-02 NOTE — ED Notes (Signed)
Pt removed c-collar, refused to keep in place or allow RN to put back on. EDP made aware

## 2012-02-02 NOTE — ED Notes (Signed)
Pt was refusing to go to CT, just wanting to get up and pee, told he can't do so until after CT is done and read. Pt yelling refusing to go to CT or cooperate. EDP to bedside to reason with patient

## 2012-02-02 NOTE — ED Provider Notes (Signed)
66 year old male on a route so for atrial fibrillation fell suffering a laceration or abrasion to his scalp. He does not remember the fall and there was probable loss of consciousness. On exam, he is awake and alert. A dressing is present on the scalp and has not remitted at this point. He has taken his cervical collar off and is refusing to wear it. No other tenderness is identified and he is neurologically intact. However, he does appear to injured his chest and mid back. Because he is on anticoagulants, he will need to be sent for CT scans to rule out internal bleeding and rule out intracerebral bleeding. Also, because of being on anticoagulants, he is at risk for delayed head bleeding.   Date: 02/02/2012  Rate: 65  Rhythm: atrial fibrillation  QRS Axis: left  Intervals: normal and QT prolonged  ST/T Wave abnormalities: nonspecific ST/T changes  Conduction Disutrbances:none  Narrative Interpretation: Atrial fibrillation, borderline left axis deviation, borderline QT prolongation. When compared with ECG of 08/23/2011, no significant changes are seen.  Old EKG Reviewed: unchanged  I saw and evaluated the patient, reviewed the resident's note and I agree with the findings and plan.   Dione Booze, MD 02/03/12 [phone]

## 2012-02-02 NOTE — ED Provider Notes (Signed)
History     CSN: [id number]  Arrival date & time 02/02/12  1639   First MD Initiated Contact with Patient 02/02/12 1654      Chief Complaint  Patient presents with  . Fall    (Consider location/radiation/quality/duration/timing/severity/associated sxs/prior treatment) HPI Comments: The patient states he drank about 6 beers today and the last thing he remembers he was on his back porch. He is unable to give me any more information. EMS reports that he was found at the bottom of his 12 steps. He may have been on the phone with his friend at the time who called 911 for him. He is on xarelto for atrial fibrillation. He vomited once in the ED just prior to my encounter with him. He denies pain to me. He states he had his tetanus less than 5 years ago.  Patient is a 66 y.o. male presenting with fall. The history is provided by the patient and the EMS personnel. The history is limited by the condition of the patient. No language interpreter was used.  Fall The accident occurred 1 to 2 hours ago. The fall occurred in unknown circumstances. He fell from an unknown (has 12 steps, found at the bottom) height. He landed on concrete. The volume of blood lost was minimal. The point of impact was the head. The pain is present in the head. The pain is at a severity of 0/10. The patient is experiencing no pain. He was not ambulatory at the scene. There was no entrapment after the fall. There was no drug use involved in the accident. There was alcohol use involved in the accident. Associated symptoms include vomiting and loss of consciousness. Treatment on scene includes a c-collar. He has tried nothing for the symptoms. The treatment provided no relief.    Past Medical History  Diagnosis Date  . Hypertension   . Tobacco abuse   . ETOH abuse     Past Surgical History  Procedure Date  . Eye muscle surgery     as a teenager "tighten his muscles"  . Back surgery 10 years ago    "slipped disc" repair     Family History  Problem Relation Age of Onset  . Cancer Mother   . Cancer Father   . Multiple sclerosis Daughter     History  Substance Use Topics  . Smoking status: Current Every Day Smoker -- 0.5 packs/day for 45 years    Types: Cigarettes  . Smokeless tobacco: Not on file  . Alcohol Use: 0.0 oz/week     Comment: Drinks 6-8 beers daily      Review of Systems  Unable to perform ROS: Mental status change  Gastrointestinal: Positive for vomiting.  Skin: Positive for wound (scalp).  Neurological: Positive for loss of consciousness.    Allergies  Codeine and Penicillins  Home Medications   Current Outpatient Rx  Name  Route  Sig  Dispense  Refill  . CARVEDILOL 6.25 MG PO TABS   Oral   Take 1 tablet (6.25 mg total) by mouth 2 (two) times daily with a meal.   60 tablet   5   . FOLIC ACID 1 MG PO TABS   Oral   Take 1 tablet (1 mg total) by mouth daily.   30 tablet   6   . ADULT MULTIVITAMIN W/MINERALS CH   Oral   Take 1 tablet by mouth daily.   30 tablet   6   . RIVAROXABAN 20 MG PO TABS  Oral   Take 20 mg by mouth daily.   30 tablet   6   . THIAMINE HCL 100 MG PO TABS   Oral   Take 1 tablet (100 mg total) by mouth daily.   30 tablet   6     BP 160/104  Pulse 64  Temp 98 F (36.7 C)  Resp 18  SpO2 98%  Physical Exam  Constitutional: He is oriented to person, place, and time. He appears well-developed. No distress.  HENT:  Head: Normocephalic.  Mouth/Throat: No oropharyngeal exudate.       Abrasion over upper left occiput  Eyes: EOM are normal. Pupils are equal, round, and reactive to light. Right eye exhibits no discharge. Left eye exhibits no discharge.  Neck: Neck supple. No JVD present.       No tenderness or step-offs over midline. Immobilized in c-collar  Cardiovascular: Normal rate, regular rhythm and normal heart sounds.   Pulmonary/Chest: Effort normal and breath sounds normal. No stridor. No respiratory distress. He has no  wheezes. He has no rales. He exhibits tenderness (right lower lateral).  Abdominal: Soft. Bowel sounds are normal. There is tenderness (Right upper quadrant). There is guarding (Mild right upper quadrant).  Genitourinary: Penis normal.  Musculoskeletal: Normal range of motion. He exhibits no edema and no tenderness.       Ecchymosis and superficial abrasion over left costovertebral angle  Neurological: He is alert and oriented to person, place, and time. He has normal reflexes. He displays normal reflexes. No cranial nerve deficit. He exhibits normal muscle tone.  Skin: Skin is warm and dry. He is not diaphoretic. No erythema. No pallor.  Psychiatric: He has a normal mood and affect. His behavior is normal. Thought content normal.       Intoxicated    ED Course  Procedures (including critical care time)  Labs Reviewed  CBC WITH DIFFERENTIAL - Abnormal; Notable for the following:    RBC 3.97 (*)     HCT 38.8 (*)     Platelets 112 (*)  PLATELET COUNT CONFIRMED BY SMEAR   All other components within normal limits  BASIC METABOLIC PANEL - Abnormal; Notable for the following:    Sodium 131 (*)     Creatinine, Ser 1.51 (*)     GFR calc non Af Amer 46 (*)     GFR calc Af Amer 54 (*)     All other components within normal limits  ETHANOL - Abnormal; Notable for the following:    Alcohol, Ethyl (B) 169 (*)     All other components within normal limits  APTT  PROTIME-INR   Ct Head Wo Contrast  02/02/2012  *RADIOLOGY REPORT*  Clinical Data:  FALL. FELL DOWN STAIRS, HEADACHE  CT HEAD WITHOUT CONTRAST CT CERVICAL SPINE WITHOUT CONTRAST  Technique:  Multidetector CT imaging of the head and cervical spine was performed following the standard protocol without IV contrast. Multiplanar CT image reconstructions of the cervical spine were also generated.  Comparison: 08/28/2011  CT HEAD  Findings: There is a left posterior parietal scalp hematoma. Interval evolution of previously noted left frontal  infarct.  The Atherosclerotic and physiologic intracranial calcifications. 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 Diffuse parenchymal atrophy. Patchy areas of hypoattenuation in deep and periventricular white matter bilaterally. Negative for acute intracranial hemorrhage, mass lesion, acute infarction, midline shift, or mass-effect. Acute infarct may be inapparent on noncontrast CT. Ventricles and sulci symmetric. Bone windows demonstrate no focal lesion.  IMPRESSION:  1. Negative for intracranial bleed or other acute intracranial process.  2. Atrophy and nonspecific white matter changes. 3.  Interval evolution of the left frontal infarct.  CT CERVICAL SPINE  Findings: Normal alignment.  No prevertebral soft tissue swelling. Moderate narrowing of the C5-6 and C6-7 interspaces.  Endplate spurring at all levels C3-C7.  Uncovertebral spurs result in bilateral foraminal encroachment C5-6 and C6-7, left greater than right.  Negative for fracture.  Bilateral calcified carotid bifurcation plaque.  Facets seated bilaterally.  IMPRESSION: 1.  Negative for fracture or other acute bony abnormality. 2.  Multilevel degenerative changes as enumerated above.   Original Report Authenticated By: D. Andria Rhein, MD    Ct Chest W Contrast  02/02/2012  *RADIOLOGY REPORT*  Clinical Data:  Larey Seat.  CT CHEST, ABDOMEN AND PELVIS WITH CONTRAST  Technique:  Multidetector CT imaging of the chest, abdomen and pelvis was performed following the standard protocol during bolus administration of intravenous contrast.  Contrast: OMNIPAQUE IOHEXOL 300 MG/ML  SOLN  Comparison:  CT scan 08/28/2011.  CT CHEST  Findings:  The chest wall is unremarkable.  No supraclavicular or axillary mass or adenopathy.  Small scattered nodes are noted.  The thyroid gland is grossly normal.  The bony thorax is intact.  No obvious fractures.  Remote healed right posterior rib fractures are noted.  Moderate degenerative change involving the thoracic  spine but no acute fracture.  The heart is normal in size.  No pericardial effusion.  No mediastinal or hilar lymphadenopathy.  The aorta is normal in caliber.  No dissection.  The esophagus is grossly normal.  There are fairly extensive coronary artery calcifications.  The pulmonary arteries appear normal.  Examination of the lung parenchyma demonstrates no acute pulmonary findings.  No pulmonary contusions, aspiration and/or pneumothorax. No worrisome pulmonary mass lesions or nodules.  IMPRESSION: No acute findings in the chest.  CT ABDOMEN AND PELVIS  Findings:  The solid abdominal organs are intact.  No acute injury is identified.  The gallbladder is normal.  No common bile duct dilatation.  The left kidney is small and scarred.  This may be due to renal artery stenosis.  The right kidney is normal.  The aorta demonstrates moderate atherosclerotic changes but no focal aneurysm or dissection.  No mesenteric or retroperitoneal masses are lymphadenopathy.  The bladder, prostate gland and seminal vesicles are unremarkable. No pelvic mass, adenopathy or free pelvic fluid collections.  No inguinal mass or hernia.  The bony structures are intact.  No lumbar compression fracture or pars defect.  IMPRESSION: No acute abdominal/pelvic findings.  Mild distention of the bladder.   Original Report Authenticated By: Rudie Meyer, M.D.    Ct Cervical Spine Wo Contrast  02/02/2012  *RADIOLOGY REPORT*  Clinical Data:  FALL. FELL DOWN STAIRS, HEADACHE  CT HEAD WITHOUT CONTRAST CT CERVICAL SPINE WITHOUT CONTRAST  Technique:  Multidetector CT imaging of the head and cervical spine was performed following the standard protocol without IV contrast. Multiplanar CT image reconstructions of the cervical spine were also generated.  Comparison: 08/28/2011  CT HEAD  Findings: There is a left posterior parietal scalp hematoma. Interval evolution of previously noted left frontal infarct.  The Atherosclerotic and physiologic intracranial  calcifications. 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 Diffuse parenchymal atrophy. Patchy areas of hypoattenuation in deep and periventricular white matter bilaterally. Negative for acute intracranial hemorrhage, mass lesion, acute infarction, midline shift, or mass-effect. Acute infarct may be inapparent on noncontrast CT. Ventricles and sulci  symmetric. Bone windows demonstrate no focal lesion.  IMPRESSION:  1. Negative for intracranial bleed or other acute intracranial process.  2. Atrophy and nonspecific white matter changes. 3.  Interval evolution of the left frontal infarct.  CT CERVICAL SPINE  Findings: Normal alignment.  No prevertebral soft tissue swelling. Moderate narrowing of the C5-6 and C6-7 interspaces.  Endplate spurring at all levels C3-C7.  Uncovertebral spurs result in bilateral foraminal encroachment C5-6 and C6-7, left greater than right.  Negative for fracture.  Bilateral calcified carotid bifurcation plaque.  Facets seated bilaterally.  IMPRESSION: 1.  Negative for fracture or other acute bony abnormality. 2.  Multilevel degenerative changes as enumerated above.   Original Report Authenticated By: D. Andria Rhein, MD    Ct Abdomen Pelvis W Contrast  02/02/2012  *RADIOLOGY REPORT*  Clinical Data:  Larey Seat.  CT CHEST, ABDOMEN AND PELVIS WITH CONTRAST  Technique:  Multidetector CT imaging of the chest, abdomen and pelvis was performed following the standard protocol during bolus administration of intravenous contrast.  Contrast: OMNIPAQUE IOHEXOL 300 MG/ML  SOLN  Comparison:  CT scan 08/28/2011.  CT CHEST  Findings:  The chest wall is unremarkable.  No supraclavicular or axillary mass or adenopathy.  Small scattered nodes are noted.  The thyroid gland is grossly normal.  The bony thorax is intact.  No obvious fractures.  Remote healed right posterior rib fractures are noted.  Moderate degenerative change involving the thoracic spine but no acute fracture.  The heart is normal  in size.  No pericardial effusion.  No mediastinal or hilar lymphadenopathy.  The aorta is normal in caliber.  No dissection.  The esophagus is grossly normal.  There are fairly extensive coronary artery calcifications.  The pulmonary arteries appear normal.  Examination of the lung parenchyma demonstrates no acute pulmonary findings.  No pulmonary contusions, aspiration and/or pneumothorax. No worrisome pulmonary mass lesions or nodules.  IMPRESSION: No acute findings in the chest.  CT ABDOMEN AND PELVIS  Findings:  The solid abdominal organs are intact.  No acute injury is identified.  The gallbladder is normal.  No common bile duct dilatation.  The left kidney is small and scarred.  This may be due to renal artery stenosis.  The right kidney is normal.  The aorta demonstrates moderate atherosclerotic changes but no focal aneurysm or dissection.  No mesenteric or retroperitoneal masses are lymphadenopathy.  The bladder, prostate gland and seminal vesicles are unremarkable. No pelvic mass, adenopathy or free pelvic fluid collections.  No inguinal mass or hernia.  The bony structures are intact.  No lumbar compression fracture or pars defect.  IMPRESSION: No acute abdominal/pelvic findings.  Mild distention of the bladder.   Original Report Authenticated By: Rudie Meyer, M.D.      1. Fall   2. Scalp contusion   3. Scalp abrasion   4. Chest wall contusion   5. Alcohol intoxication      MDM  4:56 PM s/p fall, on xarelto. Hit head, scalp abrasion. Also w/ R lower chest and RUQ pain. Unable to give hx 2/2 LOC and intoxication. Will get trauma scans. Stable now.  Imaging neg. Return precautions provided in case of delayed bleed. Pt deemed stable for discharge. Return precautions were provided and pt expressed understanding to return to ED if any acute symptoms return. Follow up was instructed which pt also expressed understanding. All questions were answered and pt was in agreement w/ plan.   Warrick Parisian, MD 02/03/12 0110

## 2012-06-25 ENCOUNTER — Encounter: Payer: Self-pay | Admitting: Geriatric Medicine

## 2012-06-28 ENCOUNTER — Ambulatory Visit: Payer: Self-pay | Admitting: Nurse Practitioner

## 2012-12-13 ENCOUNTER — Ambulatory Visit (INDEPENDENT_AMBULATORY_CARE_PROVIDER_SITE_OTHER): Payer: Medicare Other | Admitting: Family Medicine

## 2012-12-13 VITALS — BP 150/100 | HR 66 | Temp 98.0°F | Resp 18 | Ht 72.0 in | Wt 224.0 lb

## 2012-12-13 DIAGNOSIS — R21 Rash and other nonspecific skin eruption: Secondary | ICD-10-CM

## 2012-12-13 DIAGNOSIS — H60391 Other infective otitis externa, right ear: Secondary | ICD-10-CM

## 2012-12-13 DIAGNOSIS — W57XXXA Bitten or stung by nonvenomous insect and other nonvenomous arthropods, initial encounter: Secondary | ICD-10-CM

## 2012-12-13 DIAGNOSIS — H60399 Other infective otitis externa, unspecified ear: Secondary | ICD-10-CM

## 2012-12-13 DIAGNOSIS — T148 Other injury of unspecified body region: Secondary | ICD-10-CM

## 2012-12-13 MED ORDER — PREDNISONE 20 MG PO TABS: ORAL_TABLET | ORAL | Status: DC

## 2012-12-13 MED ORDER — METHYLPREDNISOLONE ACETATE 80 MG/ML IJ SUSP
80.0000 mg | Freq: Once | INTRAMUSCULAR | Status: AC
  Administered 2012-12-13: 80 mg via INTRAMUSCULAR

## 2012-12-13 MED ORDER — OFLOXACIN 0.3 % OT SOLN: 5.0000 [drp] | Freq: Every day | OTIC | Status: DC

## 2012-12-13 NOTE — Progress Notes (Signed)
Subjective: Patient has had a rash for the last 8 days. Actually longer than that the. It started on the back of his neck. Burning itch. He scratched a lot. He went to the beach. He got red bumps on other places of his body. The person with him did not get any. He itches a lot. His right ear is also started hurting and draining.  Objective: TM looks normal the left. Has a lot of moist drainage in the right. Throat clear. Neck supple without nodes. He has excoriated crusted bumps on the back of his right neck, shingles in appearance. However he doesn't have a shingles-like pain. He also has some 1 cm maculopapular areas scattered on his back and abdomen.  Assessment: Probable insect bites Otitis externa  Plan: Depo-Medrol 80 IM Oral prednisone, Zyrtec, and Floxin otic.

## 2012-12-13 NOTE — Patient Instructions (Signed)
Take the prednisone 2 pills daily for 3 days, then one pill daily for 3 days for the rash  Use the antibiotic ear drops twice daily 5 drops in right ear  Return if not improving

## 2014-05-24 ENCOUNTER — Inpatient Hospital Stay (HOSPITAL_COMMUNITY)
Admission: EM | Admit: 2014-05-24 | Discharge: 2014-05-29 | DRG: 065 | Disposition: A | Discharge status: Skilled Nursing Facility | Payer: Medicare Other | Attending: Internal Medicine | Admitting: Internal Medicine

## 2014-05-24 ENCOUNTER — Inpatient Hospital Stay (HOSPITAL_COMMUNITY): Payer: Medicare Other

## 2014-05-24 ENCOUNTER — Emergency Department (HOSPITAL_COMMUNITY): Payer: Medicare Other

## 2014-05-24 ENCOUNTER — Encounter (HOSPITAL_COMMUNITY): Payer: Self-pay | Admitting: *Deleted

## 2014-05-24 DIAGNOSIS — I639 Cerebral infarction, unspecified: Secondary | ICD-10-CM

## 2014-05-24 DIAGNOSIS — N184 Chronic kidney disease, stage 4 (severe): Secondary | ICD-10-CM | POA: Diagnosis present

## 2014-05-24 DIAGNOSIS — E669 Obesity, unspecified: Secondary | ICD-10-CM | POA: Diagnosis present

## 2014-05-24 DIAGNOSIS — E785 Hyperlipidemia, unspecified: Secondary | ICD-10-CM | POA: Diagnosis present

## 2014-05-24 DIAGNOSIS — R471 Dysarthria and anarthria: Secondary | ICD-10-CM | POA: Diagnosis present

## 2014-05-24 DIAGNOSIS — Z9114 Patient's other noncompliance with medication regimen: Secondary | ICD-10-CM | POA: Diagnosis present

## 2014-05-24 DIAGNOSIS — F17210 Nicotine dependence, cigarettes, uncomplicated: Secondary | ICD-10-CM | POA: Diagnosis present

## 2014-05-24 DIAGNOSIS — I4891 Unspecified atrial fibrillation: Secondary | ICD-10-CM | POA: Diagnosis present

## 2014-05-24 DIAGNOSIS — Z8673 Personal history of transient ischemic attack (TIA), and cerebral infarction without residual deficits: Secondary | ICD-10-CM | POA: Diagnosis not present

## 2014-05-24 DIAGNOSIS — Z91148 Patient's other noncompliance with medication regimen for other reason: Secondary | ICD-10-CM

## 2014-05-24 DIAGNOSIS — Z6832 Body mass index (BMI) 32.0-32.9, adult: Secondary | ICD-10-CM | POA: Diagnosis not present

## 2014-05-24 DIAGNOSIS — N179 Acute kidney failure, unspecified: Secondary | ICD-10-CM | POA: Diagnosis present

## 2014-05-24 DIAGNOSIS — I129 Hypertensive chronic kidney disease with stage 1 through stage 4 chronic kidney disease, or unspecified chronic kidney disease: Secondary | ICD-10-CM | POA: Diagnosis present

## 2014-05-24 DIAGNOSIS — I5022 Chronic systolic (congestive) heart failure: Secondary | ICD-10-CM | POA: Diagnosis present

## 2014-05-24 DIAGNOSIS — F1010 Alcohol abuse, uncomplicated: Secondary | ICD-10-CM | POA: Diagnosis not present

## 2014-05-24 DIAGNOSIS — Z7952 Long term (current) use of systemic steroids: Secondary | ICD-10-CM

## 2014-05-24 DIAGNOSIS — I6350 Cerebral infarction due to unspecified occlusion or stenosis of unspecified cerebral artery: Secondary | ICD-10-CM

## 2014-05-24 DIAGNOSIS — R05 Cough: Secondary | ICD-10-CM | POA: Diagnosis not present

## 2014-05-24 DIAGNOSIS — I10 Essential (primary) hypertension: Secondary | ICD-10-CM | POA: Diagnosis present

## 2014-05-24 DIAGNOSIS — Z66 Do not resuscitate: Secondary | ICD-10-CM | POA: Diagnosis present

## 2014-05-24 DIAGNOSIS — G8191 Hemiplegia, unspecified affecting right dominant side: Secondary | ICD-10-CM | POA: Diagnosis present

## 2014-05-24 DIAGNOSIS — I482 Chronic atrial fibrillation, unspecified: Secondary | ICD-10-CM

## 2014-05-24 DIAGNOSIS — Z720 Tobacco use: Secondary | ICD-10-CM | POA: Diagnosis present

## 2014-05-24 DIAGNOSIS — I6789 Other cerebrovascular disease: Secondary | ICD-10-CM

## 2014-05-24 DIAGNOSIS — I63412 Cerebral infarction due to embolism of left middle cerebral artery: Secondary | ICD-10-CM | POA: Diagnosis not present

## 2014-05-24 DIAGNOSIS — I5021 Acute systolic (congestive) heart failure: Secondary | ICD-10-CM | POA: Diagnosis present

## 2014-05-24 DIAGNOSIS — R059 Cough, unspecified: Secondary | ICD-10-CM

## 2014-05-24 HISTORY — DX: Cerebral infarction, unspecified: I63.9

## 2014-05-24 HISTORY — DX: Chronic systolic (congestive) heart failure: I50.22

## 2014-05-24 LAB — I-STAT CHEM 8, ED
BUN: 19 mg/dL (ref 6–23)
CHLORIDE: 104 mmol/L (ref 96–112)
Calcium, Ion: 1.11 mmol/L — ABNORMAL LOW (ref 1.13–1.30)
Creatinine, Ser: 2 mg/dL — ABNORMAL HIGH (ref 0.50–1.35)
GLUCOSE: 99 mg/dL (ref 70–99)
HCT: 48 % (ref 39.0–52.0)
Hemoglobin: 16.3 g/dL (ref 13.0–17.0)
POTASSIUM: 4.1 mmol/L (ref 3.5–5.1)
SODIUM: 139 mmol/L (ref 135–145)
TCO2: 20 mmol/L (ref 0–100)

## 2014-05-24 LAB — PROTIME-INR
INR: 1 (ref 0.00–1.49)
Prothrombin Time: 13.3 seconds (ref 11.6–15.2)

## 2014-05-24 LAB — URINALYSIS, ROUTINE W REFLEX MICROSCOPIC
Bilirubin Urine: NEGATIVE
Glucose, UA: NEGATIVE mg/dL
Ketones, ur: NEGATIVE mg/dL
Leukocytes, UA: NEGATIVE
Nitrite: NEGATIVE
Protein, ur: NEGATIVE mg/dL
Specific Gravity, Urine: 1.014 (ref 1.005–1.030)
Urobilinogen, UA: 0.2 mg/dL (ref 0.0–1.0)
pH: 5 (ref 5.0–8.0)

## 2014-05-24 LAB — DIFFERENTIAL
BASOS PCT: 1 % (ref 0–1)
Basophils Absolute: 0.1 10*3/uL (ref 0.0–0.1)
Eosinophils Absolute: 0.2 10*3/uL (ref 0.0–0.7)
Eosinophils Relative: 2 % (ref 0–5)
LYMPHS PCT: 20 % (ref 12–46)
Lymphs Abs: 1.8 10*3/uL (ref 0.7–4.0)
MONOS PCT: 7 % (ref 3–12)
Monocytes Absolute: 0.7 10*3/uL (ref 0.1–1.0)
Neutro Abs: 6.2 10*3/uL (ref 1.7–7.7)
Neutrophils Relative %: 70 % (ref 43–77)

## 2014-05-24 LAB — CBC
HCT: 41.9 % (ref 39.0–52.0)
Hemoglobin: 14.3 g/dL (ref 13.0–17.0)
MCH: 34 pg (ref 26.0–34.0)
MCHC: 34.1 g/dL (ref 30.0–36.0)
MCV: 99.5 fL (ref 78.0–100.0)
Platelets: 128 10*3/uL — ABNORMAL LOW (ref 150–400)
RBC: 4.21 MIL/uL — ABNORMAL LOW (ref 4.22–5.81)
RDW: 12.8 % (ref 11.5–15.5)
WBC: 9 10*3/uL (ref 4.0–10.5)

## 2014-05-24 LAB — URINE MICROSCOPIC-ADD ON

## 2014-05-24 LAB — MRSA PCR SCREENING: MRSA by PCR: NEGATIVE

## 2014-05-24 LAB — COMPREHENSIVE METABOLIC PANEL
ALBUMIN: 3.3 g/dL — AB (ref 3.5–5.2)
ALT: 12 U/L (ref 0–53)
ANION GAP: 6 (ref 5–15)
AST: 20 U/L (ref 0–37)
Alkaline Phosphatase: 62 U/L (ref 39–117)
BUN: 18 mg/dL (ref 6–23)
CO2: 27 mmol/L (ref 19–32)
Calcium: 8.9 mg/dL (ref 8.4–10.5)
Chloride: 105 mmol/L (ref 96–112)
Creatinine, Ser: 2.04 mg/dL — ABNORMAL HIGH (ref 0.50–1.35)
GFR calc non Af Amer: 32 mL/min — ABNORMAL LOW (ref >90)
GFR, EST AFRICAN AMERICAN: 37 mL/min — AB (ref >90)
Glucose, Bld: 103 mg/dL — ABNORMAL HIGH (ref 70–99)
Potassium: 4.1 mmol/L (ref 3.5–5.1)
Sodium: 138 mmol/L (ref 135–145)
Total Bilirubin: 0.6 mg/dL (ref 0.3–1.2)
Total Protein: 6.9 g/dL (ref 6.0–8.3)

## 2014-05-24 LAB — RAPID URINE DRUG SCREEN, HOSP PERFORMED
Amphetamines: NOT DETECTED
BARBITURATES: NOT DETECTED
Benzodiazepines: NOT DETECTED
Cocaine: NOT DETECTED
OPIATES: NOT DETECTED
TETRAHYDROCANNABINOL: NOT DETECTED

## 2014-05-24 LAB — I-STAT TROPONIN, ED: Troponin i, poc: 0.03 ng/mL (ref 0.00–0.08)

## 2014-05-24 LAB — APTT: aPTT: 36 seconds (ref 24–37)

## 2014-05-24 LAB — ETHANOL: Alcohol, Ethyl (B): <5 mg/dL (ref 0–9)

## 2014-05-24 MED ORDER — METOPROLOL TARTRATE 1 MG/ML IV SOLN: 2.5000 mg | Freq: Three times a day (TID) | INTRAVENOUS | Status: DC

## 2014-05-24 MED ORDER — HEPARIN SODIUM (PORCINE) 5000 UNIT/ML IJ SOLN
5000.0000 [IU] | Freq: Three times a day (TID) | INTRAMUSCULAR | Status: DC
  Administered 2014-05-24 – 2014-05-28 (×11): 5000 [IU] via SUBCUTANEOUS
  Filled 2014-05-24 (×14): qty 1

## 2014-05-24 MED ORDER — SODIUM CHLORIDE 0.9 % IV SOLN
INTRAVENOUS | Status: DC
  Administered 2014-05-24: 75 mL/h via INTRAVENOUS

## 2014-05-24 MED ORDER — HYDRALAZINE HCL 20 MG/ML IJ SOLN
10.0000 mg | Freq: Four times a day (QID) | INTRAMUSCULAR | Status: DC | PRN
  Administered 2014-05-24: 10 mg via INTRAVENOUS
  Filled 2014-05-24: qty 1

## 2014-05-24 MED ORDER — HYDRALAZINE HCL 20 MG/ML IJ SOLN: 5.0000 mg | Freq: Four times a day (QID) | INTRAMUSCULAR | Status: DC | PRN

## 2014-05-24 MED ORDER — LORAZEPAM 2 MG/ML IJ SOLN: 0.5000 mg | INTRAMUSCULAR | Status: DC | PRN

## 2014-05-24 MED ORDER — ASPIRIN 300 MG RE SUPP
300.0000 mg | Freq: Every day | RECTAL | Status: DC
  Administered 2014-05-24 – 2014-05-25 (×2): 300 mg via RECTAL
  Filled 2014-05-24 (×4): qty 1

## 2014-05-24 MED ORDER — STROKE: EARLY STAGES OF RECOVERY BOOK
Freq: Once | Status: DC
Start: 2014-05-24 — End: 2014-05-29
  Filled 2014-05-24: qty 1

## 2014-05-24 MED ORDER — METOPROLOL TARTRATE 1 MG/ML IV SOLN
2.5000 mg | Freq: Two times a day (BID) | INTRAVENOUS | Status: DC
  Filled 2014-05-24: qty 5

## 2014-05-24 MED ORDER — NICOTINE 21 MG/24HR TD PT24
21.0000 mg | MEDICATED_PATCH | Freq: Every day | TRANSDERMAL | Status: DC
  Administered 2014-05-28: 21 mg via TRANSDERMAL
  Filled 2014-05-24 (×6): qty 1

## 2014-05-24 MED ORDER — SENNOSIDES-DOCUSATE SODIUM 8.6-50 MG PO TABS
1.0000 | ORAL_TABLET | Freq: Every evening | ORAL | Status: DC | PRN
  Filled 2014-05-24: qty 1

## 2014-05-24 NOTE — Progress Notes (Signed)
Pt. Transported to MRI, had 3 episodes of dropped HR in the 30's non sustained. Pt. Asymptomatic. MD aware. Will continue to monitor patient.

## 2014-05-24 NOTE — ED Notes (Signed)
Patient is alert.  He has noted slurred speech with right sided facial droop.  Patient denies any pain.  He admits to not taking meds for at least 6 months for htn, afib.  Patient has noted cough when resting.  Head of bed is elevated.  Daughter is now at bedside.  Updated on plan of care.  Patient remains on cardiac monitoring.

## 2014-05-24 NOTE — Consult Note (Signed)
CARDIOLOGY CONSULT NOTE   Patient ID: Brian Mclaughlin MRN: [id number] DOB/AGE: 08-24-1945 69 y.o.  Admit date: 05/24/2014  Primary Physician   No PCP Per Patient Primary Cardiologist   Dr. Johney FrameAllred, consult 08/2011 Reason for Consultation   Tachy/brady  HPI:Brian Mclaughlin is a 69 y.o. year old male with a history of CVA, HTN, ETOH, Tob abuse and atrial fibrillation.   He was seen by cardiology in 2013 for atrial fibrillation in the setting of acute CVA, associated rhabdomyolysis and acute renal insufficiency. His atrial fibrillation rate was controlled. Oral anticoagulation was discussed but the patient did not wish to quit drinking, had significant fall risk and the patient did not wish to go on coumadin. His EF was 25-30% and he was started on meds, with outpatient ischemic evaluation planned once he recovered from the CVA. There was no followup.  He was admitted 03/09 with new R facial droop, felt to be probable L MCA territory CVA, beyond the window for treatment with TPA. 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 Of note, Xarelto is listed among his home medications with the notation that he is not taking it.   Brian Mclaughlin is able to nod his head yes or no in answer to questions. He appears to understand simple questions. Speech is extremely slurred and difficult to understand but he does try to speak.  He denies any awareness of a rapid or irregular heart rate. He denies any history of syncope. There is no history of chest pain. He denies lower extremity edema or any change in his normal dyspnea on exertion. Currently his heart rate is controlled and he is not in any acute distress although he has definite deficits from the CVA.  Past Medical History  Diagnosis Date  . Hypertension   . Tobacco abuse   . ETOH abuse   . Hyperlipidemia   . Chronic systolic CHF (congestive heart failure)   . Alcohol abuse   . Tobacco use disorder   .  Atrial fibrillation   . Acute kidney failure, unspecified   . Rhabdomyolysis   . Altered mental status   . Nonspecific elevation of levels of transaminase or lactic acid dehydrogenase (LDH)   . Acute, but ill-defined, cerebrovascular disease   . CVA (cerebral infarction)      Past Surgical History  Procedure Laterality Date  . Eye muscle surgery      as a teenager "tighten his muscles"  . Back surgery  10 years ago    "slipped disc" repair    Allergies  Allergen Reactions  . Codeine Rash  . Penicillins Rash    I have reviewed the patient's current medications .  stroke: mapping our early stages of recovery book   Does not apply Once  . aspirin  300 mg Rectal Daily  . heparin  5,000 Units Subcutaneous 3 times per day  . nicotine  21 mg Transdermal Daily     hydrALAZINE, LORazepam, senna-docusate  Prior to Admission medications   Medication Sig Start Date End Date Taking? Authorizing Provider  acetaminophen (TYLENOL) 500 MG tablet Take 1 tablet (500 mg total) by mouth every 6 (six) hours as needed for pain. Patient not taking: Reported on 05/24/2014 02/02/12   Warrick ParisianJackson Henley, MD  carvedilol (COREG) 6.25 MG tablet Take 1 tablet (6.25 mg total) by mouth 2 (two) times daily with a meal. Patient not taking: Reported on 05/24/2014 09/01/11 08/31/12  Lorretta HarpXilin Niu, MD  ofloxacin (FLOXIN) 0.3 % otic solution Place 5 drops into the right ear daily. Patient not taking: Reported on 05/24/2014 12/13/12   Peyton Najjar, MD  predniSONE (DELTASONE) 20 MG tablet Take 2 pills daily for 3 days, then one pill daily for 3 days Patient not taking: Reported on 05/24/2014 12/13/12   Peyton Najjar, MD  Rivaroxaban 20 MG TABS Take 20 mg by mouth daily. Patient not taking: Reported on 05/24/2014 09/01/11   Lorretta Harp, MD     History   Social History  . Marital Status: Single    Spouse Name: N/A  . Number of Children: 1  . Years of Education: N/A   Occupational History  . Retired [employer]     Retired   Social History Main Topics  . Smoking status: Current Every Day Smoker -- 0.50 packs/day for 45 years    Types: Cigarettes  . Smokeless tobacco: Not on file  . Alcohol Use: 0.0 oz/week     Comment: Drinks 6-8 beers daily  . Drug Use: No     Comment: denies any drug usage  . Sexual Activity:    Partners: Female   Other Topics Concern  . Not on file   Social History Narrative   Patient graduated HS and completed a 2 year associate's degree at [employer] college, unknown degree type...   He lives alone in [place].    Family Status  Relation Status Death Age  . Mother Deceased     died of cancer (unknown type)  . Father Deceased     died of cancer (unknown type)  . Daughter Alive     MS  . Brother Alive    Family History  Problem Relation Age of Onset  . Cancer Mother   . Cancer Father   . Multiple sclerosis Daughter      ROS:  Full 14 point review of systems complete and found to be negative unless listed above.  Physical Exam: Blood pressure 168/99, pulse 30, temperature 97.8 F (36.6 C), temperature source Oral, resp. rate 13, height 6' (1.829 m), weight 240 lb 4.8 oz (109 kg), SpO2 99 %.  General: Well developed, well nourished, male in no acute distress Head: Eyes PERRLA, No xanthomas.   Normocephalic, oropharynx without edema or exudate. Dentition: Poor Lungs: Rales bases, good air exchange Heart: Heart irregular rate and rhythm with S1, S2, no significant  murmur. pulses are 2+ both upper extrem, LLE DP pulse slightly decreased Neck: No carotid bruits. No lymphadenopathy.  JVD. Abdomen: Bowel sounds present, abdomen soft and non-tender without masses or hernias noted. Msk:  No spine or cva tenderness. No weakness, no joint deformities or effusions. Extremities: No clubbing or cyanosis.  edema.  Neuro: Alert and oriented X 3. No focal deficits noted. Psych:  Good affect, responds appropriately Skin: No rashes or lesions noted.  Labs:     Lab Results  Component Value Date   WBC 9.0 05/24/2014   HGB 16.3 05/24/2014   HCT 48.0 05/24/2014   MCV 99.5 05/24/2014   PLT 128* 05/24/2014    Recent Labs  05/24/14 0510  INR 1.00     Recent Labs Lab 05/24/14 0510 05/24/14 0518  NA 138 139  K 4.1 4.1  CL 105 104  CO2 27  --   BUN 18 19  CREATININE 2.04* 2.00*  CALCIUM 8.9  --   PROT 6.9  --   BILITOT 0.6  --   ALKPHOS 62  --  ALT 12  --   AST 20  --   GLUCOSE 103* 99  ALBUMIN 3.3*  --     Recent Labs  05/24/14 0515  TROPIPOC 0.03   Lab Results  Component Value Date   CHOL 147 08/24/2011   HDL 59 08/24/2011   LDLCALC 75 08/24/2011   TRIG 64 08/24/2011   No results found for: DDIMER No results found for: LIPASE, AMYLASE TSH  Date/Time Value Ref Range Status  08/23/2011 03:42 PM 1.869 0.350 - 4.500 uIU/mL Final   VITAMIN B-12  Date/Time Value Ref Range Status  08/23/2011 03:42 PM 479 211 - 911 pg/mL Final    Echo: 08/24/2011 Study Conclusions - Left ventricle: The cavity size was normal. Wall thickness was increased in a pattern of mild LVH. Systolic function was severely reduced. The estimated ejection fraction was in the range of 25% to 30%. Diffuse hypokinesis. Severe hypokinesis. Features are consistent with a pseudonormal left ventricular filling pattern, with concomitant abnormal relaxation and increased filling pressure (grade 2 diastolic dysfunction). Doppler parameters are consistent with both elevated ventricular end-diastolic filling pressure and elevated left atrial filling pressure. - Aortic valve: Mildly calcified annulus. Mildly calcified leaflets. Trivial regurgitation. - Right ventricle: Systolic function was low normal. - Atrial septum: No defect or patent foramen ovale was identified. Impressions: - No cardiac source of embolism was identified, but cannot be ruled out on the basis of this examination.  ECG:  Atrial fibrillation, no acute  ischemic changes, frequent PVCs on telemetry.  Radiology:  Ct Head Wo Contrast 05/24/2014   CLINICAL DATA:  Code stroke. Right facial droop, right arm weakness, slurred speech.  EXAM: CT HEAD WITHOUT CONTRAST  TECHNIQUE: Contiguous axial images were obtained from the base of the skull through the vertex without intravenous contrast.  COMPARISON:  02/02/2012  FINDINGS: No intracranial hemorrhage, mass effect, or midline shift. No hydrocephalus. The basilar cisterns are patent. Small subcentimeter focus of hypodensity in the left occipital lobe, may reflect new site of ischemia versus volume averaging. Encephalomalacia in the left frontal lobe, unchanged from prior exam. Moderate chronic small vessel ischemic change. Unchanged lacunar infarct in the right caudate. No intracranial fluid collection. Calvarium is intact. Chronic opacification of scattered ethmoid air cells and mucosal thickening left frontal sinus. Scattered opacification of lower mastoid air cells, improved on the right.  IMPRESSION: 1. Small subcentimeter focus of hypodensity in the left occipital lobe, may reflect new site of ischemia versus volume averaging. 2. Encephalomalacia in the left frontal lobe. Chronic small vessel ischemic change and remote lacunar infarct in the right caudate.   Electronically Signed   By: Rubye Oaks M.D.   On: 05/24/2014 05:31   Dg Chest Port 1 View 05/24/2014   CLINICAL DATA:  Cough  EXAM: PORTABLE CHEST - 1 VIEW  COMPARISON:  02/02/2012  FINDINGS: Cardiac shadow is enlarged. The lungs are well aerated bilaterally. No focal infiltrate or sizable effusion is seen. No bony abnormality is noted.  IMPRESSION: No active disease.   Electronically Signed   By: Alcide Clever M.D.   On: 05/24/2014 13:54    ASSESSMENT AND PLAN:   The patient was seen today by Dr. Tresa Endo, the patient evaluated and the data reviewed.  Principal Problem:   Acute ischemic stroke - per IM/Neuro  Active Problems:   HTN (hypertension) -  We will leave to IM/Neurology    Alcohol abuse - Per IM    Tobacco abuse - Per IM    Acute renal failure -  Creatinine is higher than during his previous admission, no interim values available - Per IM    Atrial fibrillation - Patient is asymptomatic secondary to his atrial fibrillation and has no history of syncope - Rate is currently controlled. - Although he has occasional pauses of 2-3 seconds, his baseline heart rate never drops below 60, and no pauses are seen greater than 3 seconds. - He also does not have sustained tachycardia - Therefore, will not add any rate lowering medications at this time. We'll continue to follow him    Chronic systolic CHF (congestive heart failure) - Chest x-ray is without edema or CHF and no BNP was performed. He does not appear to be acutely short of breath and no significant volume overload on exam. - Management per IM    CVA (cerebral infarction) - Per Neurology/IM    LVD - EF in 2013 was 25-30 percent - Repeat echo ordered, will follow up on results  Signed: Theodore Demark, PA-C 05/24/2014 2:12 PM Beeper [phone]  Co-Sign MD   Patient seen and examined. Agree with assessment and plan. Brian Mclaughlin with permanent AF who has not been on anticoagulation. 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 Suspect the present CVA is thromboembolic most likely from AF, but with prior EF 25% also risk for apical thrombus.  At present AF rate is in the 70's, although rare isolated pause to < 30 for1 beat and intermittent PVC's. He has not had any sustained episodes of AF with RVR.  Will await echo findings. Once he stabilizes neurologically and is not at risk of conversion to hemorrhagic infarction will need to re-address issue of anticoagulation. Will need treatment for reduced EF is echo again confirm a  cardiomyopathy and at some point consider a pharmocologic nuclear myocardial perfusion study.   [name], MD, [facility] 05/24/2014 2:37 PM

## 2014-05-24 NOTE — ED Notes (Signed)
Patient lives alone.  He reports he noticed that he was having some right sided weakness and difficulty last night at 2330.  He went to bed, thought he would sleep it off.  He called a friend today and was noted to have slurred speech.  Friend called ems.  Patient has hx of cva with vertigo residual.  Patient has noted right sided weakness/facial weakness.  Patient has slurred speech.  He is alert.  He has hx of afib, htn, and cva.  He has hx of poor compliance.  He laughed when asked about seeing his MD.  Patient admits to etoh and smoking.  He has not taken meds for 6 mths at least.  cbg reported to be 100

## 2014-05-24 NOTE — Consult Note (Signed)
Admission H&P    Chief Complaint: New onset slurred speech and facial droop.  HPI: Brian Mclaughlin is an 69 y.o. male with a history of hypertension, previous stroke, alcohol abuse, atrial fibrillation and hyperlipidemia, presenting with new onset right facial droop and slurred speech. He was last known well at 11:30 PM. Deficits improved since onset. Patient has been noncompliant with taking medications including anticoagulation with Lesia Hausen, as well as Coreg and antihypertensive medication. CT scan of his head showed no acute intracranial hemorrhage. A subcentimeter focus of hypodensity was noted in the left occipital lobe, possibly a small acute stroke. There was encephalomalacia in the left frontal lobe from previous stroke. NIH stroke score was 4. Patient was beyond time window for treatment with IV TPA.  LSN: 11:30 PM on 05/23/2014 tPA Given: No: Beyond time window for treatment consideration mRankin:  Past Medical History  Diagnosis Date  . Hypertension   . Tobacco abuse   . ETOH abuse   . Hyperlipidemia   . CHF (congestive heart failure)   . Alcohol abuse   . Tobacco use disorder   . Atrial fibrillation   . Acute kidney failure, unspecified   . Rhabdomyolysis   . Rhabdomyolysis   . Altered mental status   . Nonspecific elevation of levels of transaminase or lactic acid dehydrogenase (LDH)   . Acute, but ill-defined, cerebrovascular disease   . CVA (cerebral infarction)     Past Surgical History  Procedure Laterality Date  . Eye muscle surgery      as a teenager "tighten his muscles"  . Back surgery  10 years ago    "slipped disc" repair    Family History  Problem Relation Age of Onset  . Cancer Mother   . Cancer Father   . Multiple sclerosis Daughter    Social History:  reports that he has been smoking Cigarettes.  He has a 22.5 pack-year smoking history. He does not have any smokeless tobacco history on file. He reports that he drinks alcohol. He reports that he does  not use illicit drugs.  Allergies:  Allergies  Allergen Reactions  . Codeine Rash  . Penicillins Rash    Medications: Patient had not taken any of his prescribed medications for at least 6 months. A list of his prescribed medications was reviewed, however.  ROS: History obtained from the patient  General ROS: negative for - chills, fatigue, fever, night sweats, weight gain or weight loss Psychological ROS: negative for - behavioral disorder, hallucinations, memory difficulties, mood swings or suicidal ideation Ophthalmic ROS: negative for - blurry vision, double vision, eye pain or loss of vision ENT ROS: negative for - epistaxis, nasal discharge, oral lesions, sore throat, tinnitus or vertigo Allergy and Immunology ROS: negative for - hives or itchy/watery eyes Hematological and Lymphatic ROS: negative for - bleeding problems, bruising or swollen lymph nodes Endocrine ROS: negative for - galactorrhea, hair pattern changes, polydipsia/polyuria or temperature intolerance Respiratory ROS: negative for - cough, hemoptysis, shortness of breath or wheezing Cardiovascular ROS: negative for - chest pain, dyspnea on exertion, edema or irregular heartbeat Gastrointestinal ROS: negative for - abdominal pain, diarrhea, hematemesis, nausea/vomiting or stool incontinence Genito-Urinary ROS: negative for - dysuria, hematuria, incontinence or urinary frequency/urgency Musculoskeletal ROS: negative for - joint swelling or muscular weakness Neurological ROS: as noted in HPI Dermatological ROS: negative for rash and skin lesion changes  Physical Examination: Blood pressure 156/110, pulse 74, temperature 98.1 F (36.7 C), temperature source Oral, resp. rate 28, height 6' (  1.829 m), weight 114.76 kg (253 lb), SpO2 96 %.  HEENT-  Normocephalic, no lesions, without obvious abnormality.  Normal external eye and conjunctiva.  Normal TM's bilaterally.  Normal auditory canals and external ears. Normal  external nose, mucus membranes and septum.  Normal pharynx. Neck supple with no masses, nodes, nodules or enlargement. Cardiovascular - irregularly irregular rhythm and S1, S2 normal Lungs - chest clear, no wheezing, rales, normal symmetric air entry, Heart exam - S1, S2 normal, no murmur, no gallop, rate regular Abdomen - soft, non-tender; bowel sounds normal; no masses,  no organomegaly Extremities - no joint deformities, effusion, or inflammation and no edema  Neurologic Examination: Mental Status: Alert, oriented, thought content appropriate.  Speech moderately slurred without evidence of aphasia. Able to follow commands without difficulty. Cranial Nerves: II-Visual fields were normal. III/IV/VI-Pupils were equal and reacted normally to light. Extraocular movements were full and conjugate.    V/VII-no facial numbness; moderate right lower facial weakness. VIII-normal. X-moderate dysarthria. XI: trapezius strength/neck flexion strength normal bilaterally XII-midline tongue extension with normal strength. Motor: 5/5 bilaterally with normal tone and bulk with no drift of any extremities Sensory: Normal throughout. Deep Tendon Reflexes: 1+ and symmetric. Plantars: Mute bilaterally Cerebellar: Slightly impaired coordination of right upper extremity.  Results for orders placed or performed during the hospital encounter of 05/24/14 (from the past 48 hour(s))  CBC     Status: Abnormal   Collection Time: 05/24/14  5:10 AM  Result Value Ref Range   WBC 9.0 4.0 - 10.5 K/uL   RBC 4.21 (L) 4.22 - 5.81 MIL/uL   Hemoglobin 14.3 13.0 - 17.0 g/dL   HCT 16.1 09.6 - 04.5 %   MCV 99.5 78.0 - 100.0 fL   MCH 34.0 26.0 - 34.0 pg   MCHC 34.1 30.0 - 36.0 g/dL   RDW 40.9 81.1 - 91.4 %   Platelets 128 (L) 150 - 400 K/uL  Differential     Status: None   Collection Time: 05/24/14  5:10 AM  Result Value Ref Range   Neutrophils Relative % 70 43 - 77 %   Neutro Abs 6.2 1.7 - 7.7 K/uL   Lymphocytes  Relative 20 12 - 46 %   Lymphs Abs 1.8 0.7 - 4.0 K/uL   Monocytes Relative 7 3 - 12 %   Monocytes Absolute 0.7 0.1 - 1.0 K/uL   Eosinophils Relative 2 0 - 5 %   Eosinophils Absolute 0.2 0.0 - 0.7 K/uL   Basophils Relative 1 0 - 1 %   Basophils Absolute 0.1 0.0 - 0.1 K/uL  I-Stat Troponin, ED (not at [facility])     Status: None   Collection Time: 05/24/14  5:15 AM  Result Value Ref Range   Troponin i, poc 0.03 0.00 - 0.08 ng/mL   Comment 3            Comment: Due to the release kinetics of cTnI, a negative result within the first hours of the onset of symptoms does not rule out myocardial infarction with certainty. If myocardial infarction is still suspected, repeat the test at appropriate intervals.   I-Stat Chem 8, ED     Status: Abnormal   Collection Time: 05/24/14  5:18 AM  Result Value Ref Range   Sodium 139 135 - 145 mmol/L   Potassium 4.1 3.5 - 5.1 mmol/L   Chloride 104 96 - 112 mmol/L   BUN 19 6 - 23 mg/dL   Creatinine, Ser 7.82 (H) 0.50 - 1.35 mg/dL  Glucose, Bld 99 70 - 99 mg/dL   Calcium, Ion 5.401.11 (L) 1.13 - 1.30 mmol/L   TCO2 20 0 - 100 mmol/L   Hemoglobin 16.3 13.0 - 17.0 g/dL   HCT 98.148.0 19.139.0 - 47.852.0 %   Ct Head Wo Contrast  05/24/2014   CLINICAL DATA:  Code stroke. Right facial droop, right arm weakness, slurred speech.  EXAM: CT HEAD WITHOUT CONTRAST  TECHNIQUE: Contiguous axial images were obtained from the base of the skull through the vertex without intravenous contrast.  COMPARISON:  02/02/2012  FINDINGS: No intracranial hemorrhage, mass effect, or midline shift. No hydrocephalus. The basilar cisterns are patent. Small subcentimeter focus of hypodensity in the left occipital lobe, may reflect new site of ischemia versus volume averaging. Encephalomalacia in the left frontal lobe, unchanged from prior exam. Moderate chronic small vessel ischemic change. Unchanged lacunar infarct in the right caudate. No intracranial fluid collection. Calvarium is intact. Chronic  opacification of scattered ethmoid air cells and mucosal thickening left frontal sinus. Scattered opacification of lower mastoid air cells, improved on the right.  IMPRESSION: 1. Small subcentimeter focus of hypodensity in the left occipital lobe, may reflect new site of ischemia versus volume averaging. 2. Encephalomalacia in the left frontal lobe. Chronic small vessel ischemic change and remote lacunar infarct in the right caudate.   Electronically Signed   By: Rubye OaksMelanie  Ehinger M.D.   On: 05/24/2014 05:31    Assessment: 69 y.o. male with multiple risk factors for stroke as well as history of previous stroke presenting with probable left MCA territory subcortical acute ischemic infarction.  Stroke Risk Factors - atrial fibrillation, hyperlipidemia and hypertension  Plan: 1. HgbA1c, fasting lipid panel 2. MRI, MRA  of the brain without contrast 3. PT consult, OT consult, Speech consult 4. Echocardiogram 5. Carotid dopplers 6. Prophylactic therapy-Anticoagulation: Xarelto? 7. Risk factor modification 8. Telemetry monitoring  C.R. Roseanne RenoStewart, MD [facility] [phone]  05/24/2014, 5:43 AM

## 2014-05-24 NOTE — Progress Notes (Signed)
STROKE TEAM PROGRESS NOTE   HISTORY Brian Mclaughlin is an 69 y.o. male with a history of hypertension, previous stroke, alcohol abuse, atrial fibrillation and hyperlipidemia, presenting with new onset right facial droop and slurred speech. He was last known well at 11:30 PM 05/22/2013. Deficits improved since onset. Patient has been noncompliant with taking medications including anticoagulation with Lesia Hausen, as well as Coreg and antihypertensive medication. CT scan of his head showed no acute intracranial hemorrhage. A subcentimeter focus of hypodensity was noted in the left occipital lobe, possibly a small acute stroke. There was encephalomalacia in the left frontal lobe from previous stroke. NIH stroke score was 4. Patient was beyond time window for treatment with IV TPA. Patient was not administered TPA secondary to delay in arrival. He was admitted for further evaluation and treatment.   SUBJECTIVE (INTERVAL HISTORY) No family is at the bedside.  He was transferred to 3 S. Due to bradycardia and atrial fibrillation control   OBJECTIVE Temp:  [97.8 F (36.6 C)-98.1 F (36.7 C)] 97.8 F (36.6 C) (03/09 1220) Pulse Rate:  [30-144] 30 (03/09 1300) Cardiac Rhythm:  [-] Atrial fibrillation (03/09 1220) Resp:  [13-28] 13 (03/09 1300) BP: (156-185)/(97-130) 168/99 mmHg (03/09 1300) SpO2:  [94 %-100 %] 99 % (03/09 1300) Weight:  [109 kg (240 lb 4.8 oz)-114.76 kg (253 lb)] 109 kg (240 lb 4.8 oz) (03/09 1220)  No results for input(s): GLUCAP in the last 168 hours.  Recent Labs Lab 05/24/14 0510 05/24/14 0518  NA 138 139  K 4.1 4.1  CL 105 104  CO2 27  --   GLUCOSE 103* 99  BUN 18 19  CREATININE 2.04* 2.00*  CALCIUM 8.9  --     Recent Labs Lab 05/24/14 0510  AST 20  ALT 12  ALKPHOS 62  BILITOT 0.6  PROT 6.9  ALBUMIN 3.3*    Recent Labs Lab 05/24/14 0510 05/24/14 0518  WBC 9.0  --   NEUTROABS 6.2  --   HGB 14.3 16.3  HCT 41.9 48.0  MCV 99.5  --   PLT 128*  --    No  results for input(s): CKTOTAL, CKMB, CKMBINDEX, TROPONINI in the last 168 hours.  Recent Labs  05/24/14 0510  LABPROT 13.3  INR 1.00    Recent Labs  05/24/14 0548  COLORURINE YELLOW  LABSPEC 1.014  PHURINE 5.0  GLUCOSEU NEGATIVE  HGBUR MODERATE*  BILIRUBINUR NEGATIVE  KETONESUR NEGATIVE  PROTEINUR NEGATIVE  UROBILINOGEN 0.2  NITRITE NEGATIVE  LEUKOCYTESUR NEGATIVE       Component Value Date/Time   CHOL 147 08/24/2011 0540   TRIG 64 08/24/2011 0540   HDL 59 08/24/2011 0540   CHOLHDL 2.5 08/24/2011 0540   VLDL 13 08/24/2011 0540   LDLCALC 75 08/24/2011 0540   Lab Results  Component Value Date   HGBA1C 5.3 08/23/2011      Component Value Date/Time   LABOPIA NONE DETECTED 05/24/2014 0548   COCAINSCRNUR NONE DETECTED 05/24/2014 0548   LABBENZ NONE DETECTED 05/24/2014 0548   AMPHETMU NONE DETECTED 05/24/2014 0548   THCU NONE DETECTED 05/24/2014 0548   LABBARB NONE DETECTED 05/24/2014 0548     Recent Labs Lab 05/24/14 0510  ETH <5    Ct Head Wo Contrast  05/24/2014   CLINICAL DATA:  Code stroke. Right facial droop, right arm weakness, slurred speech.  EXAM: CT HEAD WITHOUT CONTRAST  TECHNIQUE: Contiguous axial images were obtained from the base of the skull through the vertex without intravenous contrast.  COMPARISON:  02/02/2012  FINDINGS: No intracranial hemorrhage, mass effect, or midline shift. No hydrocephalus. The basilar cisterns are patent. Small subcentimeter focus of hypodensity in the left occipital lobe, may reflect new site of ischemia versus volume averaging. Encephalomalacia in the left frontal lobe, unchanged from prior exam. Moderate chronic small vessel ischemic change. Unchanged lacunar infarct in the right caudate. No intracranial fluid collection. Calvarium is intact. Chronic opacification of scattered ethmoid air cells and mucosal thickening left frontal sinus. Scattered opacification of lower mastoid air cells, improved on the right.  IMPRESSION:  1. Small subcentimeter focus of hypodensity in the left occipital lobe, may reflect new site of ischemia versus volume averaging. 2. Encephalomalacia in the left frontal lobe. Chronic small vessel ischemic change and remote lacunar infarct in the right caudate.   Electronically Signed   By: Rubye OaksMelanie  Ehinger M.D.   On: 05/24/2014 05:31   Dg Chest Port 1 View  05/24/2014   CLINICAL DATA:  Cough  EXAM: PORTABLE CHEST - 1 VIEW  COMPARISON:  02/02/2012  FINDINGS: Cardiac shadow is enlarged. The lungs are well aerated bilaterally. No focal infiltrate or sizable effusion is seen. No bony abnormality is noted.  IMPRESSION: No active disease.   Electronically Signed   By: Alcide CleverMark  Lukens M.D.   On: 05/24/2014 13:54     PHYSICAL EXAM Frail elderly male not in distress. . Afebrile. Head is nontraumatic. Neck is supple without bruit.    Cardiac exam no murmur or gallop. Lungs are clear to auscultation. Distal pulses are well felt. Neurological Exam : Awake alert mild expressive aphasia with nonfluent speech and word finding difficulties. Good comprehension. Diminished naming and repetition. Extraocular movements are full range without nystagmus. Fundi were not visualized. Vision acuity and fields seem adequate. The right lower facial weakness. Tongue midline. Motor system exam revealed no upper or lower extremity drift but mild weakness of the right grip and intrinsic hand muscles and right hip flexors. Orbits left over right upper extremity. Diminished fine finger movements on the right. ASSESSMENT/PLAN Mr. Brian SerLee C Mclaughlin is a 69 y.o. male with history of hypertension, previous stroke, alcohol abuse, atrial fibrillation and hyperlipidemia presenting with slurred speech and right facial droop. He did not receive IV t-PA due to delay in arrival.   Stroke:  Probable Left brain stroke, likely MCA territory, felt to be embolic secondary to known atrial fibrillation. MRI pending  Resultant  Right facial weakness,  dysarthria  MRI  pending  MRA  Pending  Repeat CT not indicated unless MRI unable to be completed.  Carotid Doppler  pending  2D Echo  Pending  HgbA1c pending , goal < 7.0  Heparin 5000 units sq tid for VTE prophylaxis  Diet NPO time specified   xarelto ( rivaroxaban) ordered but pt non-compliant prior to admission, now on no antithrombotic. As NPO, will go ahead and start aspirin 300 mg. Recommend continuation of NOAC once able to swallow.  Ongoing aggressive stroke risk factor management  Therapy recommendations:  pending   Disposition:  pending   Atrial Fibrillation  Home meds:  Xarelto, noncompliant  Rate uncontrolled, transferred to 3S. Cardiology consulted   Hypertension  Slightly elevated  Permissive hypertension (OK if < 220/120) but gradually normalize in 5-7 days   Hyperlipidemia  Home meds:  No statin listed  LDL pending , goal < 70  Other Stroke Risk Factors  Advanced age  Cigarette smoker, advised to stop smoking  ETOH use  Obesity, Body mass index is 32.58 kg/(m^2).  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 Known low EF 25-30% with wheezing now, have discontinued IVF  Acute on chronic renal failure  Other Pertinent History  Pt is a DNR  Hospital day # 0  Rhoderick Moody [facility] See Amion for Pager information 05/24/2014 3:24 PM  I have personally examined this patient, reviewed notes, independently viewed imaging studies, participated in medical decision making and plan of care. I have made any additions or clarifications directly to the above note. Agree with note above. He presented with aphasia and dysarthria and right  facial weakness likely from a left MCA branch infarct of embolic etiology from atrial fibrillation. He has a long-standing history of noncompliance with medications. He remains at risk for recurrent strokes and TIAs and neurological worsening. Continue ongoing stroke evaluation and need to carefully  Weigh risk  benefitis carefully as well his compliance with starting anticoagulation.  Delia Heady, MD Medical Director [facility] Pager: [phone] 05/24/2014 6:25 PM    To contact Stroke Continuity provider, please refer to [email or web address]. After hours, contact General Neurology

## 2014-05-24 NOTE — ED Provider Notes (Signed)
CSN: [id number]     Arrival date & time 05/24/14  0506 History   First MD Initiated Contact with Patient 05/24/14 [phone]     Chief Complaint  Patient presents with  . Code Stroke   Level V caveat due to speech difficulty  (Consider location/radiation/quality/duration/timing/severity/associated sxs/prior Treatment) HPI  Patient reported to EMS about 11:30 PM he noted he was having some right-sided weakness and slurred speech. He lives home alone and this morning called some friends who noted the slurred speech and called EMS to transport patient to the ED. She has a history of a prior stroke. He denies any headache. He has a history of atrial fibrillation and hypertension however he has not taken his medications in over 6 months. He denies headache, chest pain, or shortness of breath.   PCP none  Past Medical History  Diagnosis Date  . Hypertension   . Tobacco abuse   . ETOH abuse   . Hyperlipidemia   . CHF (congestive heart failure)   . Alcohol abuse   . Tobacco use disorder   . Atrial fibrillation   . Acute kidney failure, unspecified   . Rhabdomyolysis   . Rhabdomyolysis   . Altered mental status   . Nonspecific elevation of levels of transaminase or lactic acid dehydrogenase (LDH)   . Acute, but ill-defined, cerebrovascular disease   . CVA (cerebral infarction)    Past Surgical History  Procedure Laterality Date  . Eye muscle surgery      as a teenager "tighten his muscles"  . Back surgery  10 years ago    "slipped disc" repair   Family History  Problem Relation Age of Onset  . Cancer Mother   . Cancer Father   . Multiple sclerosis Daughter    History  Substance Use Topics  . Smoking status: Current Every Day Smoker -- 0.50 packs/day for 45 years    Types: Cigarettes  . Smokeless tobacco: Not on file  . Alcohol Use: 0.0 oz/week     Comment: Drinks 6-8 beers daily  lives alone Drinks   Review of Systems  All other systems reviewed and are  negative.     Allergies  Codeine and Penicillins  Home Medications   Prior to Admission medications   Medication Sig Start Date End Date Taking? Authorizing Provider  acetaminophen (TYLENOL) 500 MG tablet Take 1 tablet (500 mg total) by mouth every 6 (six) hours as needed for pain. 02/02/12   Warrick Parisian, MD  carvedilol (COREG) 6.25 MG tablet Take 1 tablet (6.25 mg total) by mouth 2 (two) times daily with a meal. 09/01/11 08/31/12  Lorretta Harp, MD  ofloxacin (FLOXIN) 0.3 % otic solution Place 5 drops into the right ear daily. 12/13/12   Peyton Najjar, MD  predniSONE (DELTASONE) 20 MG tablet Take 2 pills daily for 3 days, then one pill daily for 3 days 12/13/12   Peyton Najjar, MD  Rivaroxaban 20 MG TABS Take 20 mg by mouth daily. 09/01/11   Lorretta Harp, MD   Patient states he has not taking any medication for over 6 months  BP 156/110 mmHg  Pulse 74  Resp 28  Ht 6' (1.829 m)  SpO2 96%  Vital signs normal except for hypertension  Physical Exam  Constitutional: He is oriented to person, place, and time. He appears well-developed and well-nourished.  Non-toxic appearance. He does not appear ill. No distress.  HENT:  Head: Normocephalic and atraumatic.  Right Ear: External ear normal.  Left Ear: External ear normal.  Nose: Nose normal. No mucosal edema or rhinorrhea.  Mouth/Throat: Oropharynx is clear and moist and mucous membranes are normal. No dental abscesses or uvula swelling.  Eyes: Conjunctivae and EOM are normal. Pupils are equal, round, and reactive to light.  Neck: Normal range of motion and full passive range of motion without pain. Neck supple.  Cardiovascular: Normal rate, regular rhythm and normal heart sounds.  Exam reveals no gallop and no friction rub.   No murmur heard. Pulmonary/Chest: Effort normal and breath sounds normal. No respiratory distress. He has no wheezes. He has no rhonchi. He has no rales. He exhibits no tenderness and no crepitus.  Abdominal: Soft.  Normal appearance and bowel sounds are normal. He exhibits no distension. There is no tenderness. There is no rebound and no guarding.  Musculoskeletal: Normal range of motion. He exhibits no edema or tenderness.  Moves all extremities well.   Neurological: He is alert and oriented to person, place, and time. He has normal strength. A cranial nerve deficit is present.  Patient has equal grips, there is no pronator drift, finger-to-nose is intact, no gross visual field loss. Patient is noted to have a right facial droop.  Skin: Skin is warm, dry and intact. No rash noted. No erythema. No pallor.  Psychiatric: He has a normal mood and affect. His speech is normal and behavior is normal. His mood appears not anxious.  Nursing note and vitals reviewed.   ED Course  Procedures (including critical care time)  Medications - No data to display    05:25 patient has been evaluated by Dr. Roseanne Reno, neurologist. He wants medicine to admit.  07:10 PA [place], hospitalist service, admit to neuro-tele  Labs Review Results for orders placed or performed during the hospital encounter of 05/24/14  Ethanol  Result Value Ref Range   Alcohol, Ethyl (B) <5 0 - 9 mg/dL  Protime-INR  Result Value Ref Range   Prothrombin Time 13.3 11.6 - 15.2 seconds   INR 1.00 0.00 - 1.49  APTT  Result Value Ref Range   aPTT 36 24 - 37 seconds  CBC  Result Value Ref Range   WBC 9.0 4.0 - 10.5 K/uL   RBC 4.21 (L) 4.22 - 5.81 MIL/uL   Hemoglobin 14.3 13.0 - 17.0 g/dL   HCT 69.6 29.5 - 28.4 %   MCV 99.5 78.0 - 100.0 fL   MCH 34.0 26.0 - 34.0 pg   MCHC 34.1 30.0 - 36.0 g/dL   RDW 13.2 44.0 - 10.2 %   Platelets 128 (L) 150 - 400 K/uL  Differential  Result Value Ref Range   Neutrophils Relative % 70 43 - 77 %   Neutro Abs 6.2 1.7 - 7.7 K/uL   Lymphocytes Relative 20 12 - 46 %   Lymphs Abs 1.8 0.7 - 4.0 K/uL   Monocytes Relative 7 3 - 12 %   Monocytes Absolute 0.7 0.1 - 1.0 K/uL   Eosinophils Relative 2 0 - 5 %    Eosinophils Absolute 0.2 0.0 - 0.7 K/uL   Basophils Relative 1 0 - 1 %   Basophils Absolute 0.1 0.0 - 0.1 K/uL  Comprehensive metabolic panel  Result Value Ref Range   Sodium 138 135 - 145 mmol/L   Potassium 4.1 3.5 - 5.1 mmol/L   Chloride 105 96 - 112 mmol/L   CO2 27 19 - 32 mmol/L   Glucose, Bld 103 (H) 70 - 99 mg/dL   BUN 18  6 - 23 mg/dL   Creatinine, Ser 1.61 (H) 0.50 - 1.35 mg/dL   Calcium 8.9 8.4 - 09.6 mg/dL   Total Protein 6.9 6.0 - 8.3 g/dL   Albumin 3.3 (L) 3.5 - 5.2 g/dL   AST 20 0 - 37 U/L   ALT 12 0 - 53 U/L   Alkaline Phosphatase 62 39 - 117 U/L   Total Bilirubin 0.6 0.3 - 1.2 mg/dL   GFR calc non Af Amer 32 (L) >90 mL/min   GFR calc Af Amer 37 (L) >90 mL/min   Anion gap 6 5 - 15  Urine Drug Screen  Result Value Ref Range   Opiates NONE DETECTED NONE DETECTED   Cocaine NONE DETECTED NONE DETECTED   Benzodiazepines NONE DETECTED NONE DETECTED   Amphetamines NONE DETECTED NONE DETECTED   Tetrahydrocannabinol NONE DETECTED NONE DETECTED   Barbiturates NONE DETECTED NONE DETECTED  Urinalysis, Routine w reflex microscopic  Result Value Ref Range   Color, Urine YELLOW YELLOW   APPearance CLEAR CLEAR   Specific Gravity, Urine 1.014 1.005 - 1.030   pH 5.0 5.0 - 8.0   Glucose, UA NEGATIVE NEGATIVE mg/dL   Hgb urine dipstick MODERATE (A) NEGATIVE   Bilirubin Urine NEGATIVE NEGATIVE   Ketones, ur NEGATIVE NEGATIVE mg/dL   Protein, ur NEGATIVE NEGATIVE mg/dL   Urobilinogen, UA 0.2 0.0 - 1.0 mg/dL   Nitrite NEGATIVE NEGATIVE   Leukocytes, UA NEGATIVE NEGATIVE  Urine microscopic-add on  Result Value Ref Range   Squamous Epithelial / LPF RARE RARE   WBC, UA 0-2 <3 WBC/hpf   RBC / HPF 11-20 <3 RBC/hpf   Bacteria, UA RARE RARE  I-Stat Chem 8, ED  Result Value Ref Range   Sodium 139 135 - 145 mmol/L   Potassium 4.1 3.5 - 5.1 mmol/L   Chloride 104 96 - 112 mmol/L   BUN 19 6 - 23 mg/dL   Creatinine, Ser 0.45 (H) 0.50 - 1.35 mg/dL   Glucose, Bld 99 70 - 99 mg/dL    Calcium, Ion 4.09 (L) 1.13 - 1.30 mmol/L   TCO2 20 0 - 100 mmol/L   Hemoglobin 16.3 13.0 - 17.0 g/dL   HCT 81.1 91.4 - 78.2 %  I-Stat Troponin, ED (not at [facility])  Result Value Ref Range   Troponin i, poc 0.03 0.00 - 0.08 ng/mL   Comment 3           Laboratory interpretation all normal except stable renal insufficiency, microscopic hematuria  Imaging Review Ct Head Wo Contrast  05/24/2014   CLINICAL DATA:  Code stroke. Right facial droop, right arm weakness, slurred speech.  EXAM: CT HEAD WITHOUT CONTRAST  TECHNIQUE: Contiguous axial images were obtained from the base of the skull through the vertex without intravenous contrast.  COMPARISON:  02/02/2012  FINDINGS: No intracranial hemorrhage, mass effect, or midline shift. No hydrocephalus. The basilar cisterns are patent. Small subcentimeter focus of hypodensity in the left occipital lobe, may reflect new site of ischemia versus volume averaging. Encephalomalacia in the left frontal lobe, unchanged from prior exam. Moderate chronic small vessel ischemic change. Unchanged lacunar infarct in the right caudate. No intracranial fluid collection. Calvarium is intact. Chronic opacification of scattered ethmoid air cells and mucosal thickening left frontal sinus. Scattered opacification of lower mastoid air cells, improved on the right.  IMPRESSION: 1. Small subcentimeter focus of hypodensity in the left occipital lobe, may reflect new site of ischemia versus volume averaging. 2. Encephalomalacia in the left frontal lobe. Chronic  small vessel ischemic change and remote lacunar infarct in the right caudate.   Electronically Signed   By: Rubye OaksMelanie  Ehinger M.D.   On: 05/24/2014 05:31     EKG Interpretation   Date/Time:  Wednesday May 24 2014 05:29:28 EST Ventricular Rate:  63 PR Interval:    QRS Duration: 116 QT Interval:  449 QTC Calculation: 460 R Axis:   43 Text Interpretation:  Atrial fibrillation Nonspecific intraventricular  conduction delay  Nonspecific T abnormalities, inferior leads No  significant change since last tracing 02 Feb 2012 Confirmed by [facility]   MD-I, Rosaland Shiffman ([id number]) on 05/24/2014 5:31:25 AM      MDM   Final diagnoses:  Acute ischemic stroke  Chronic atrial fibrillation  Noncompliance with medications    Plan admission   Devoria AlbeIva Izek Corvino, MD, Concha PyoFACEP     Adeleigh Barletta, MD 05/24/14 [phone]

## 2014-05-24 NOTE — Progress Notes (Signed)
Pt arrived to 4N29 at current time.  Pt A&O x 4,  Placed on telemetry box #29, no complaints of pain. Pt V/S taken, and monitor set up to take vitals Q2 hours, NIH done, see doc flowsheets. Pt without distress.  Family at the bedside. Pt NPO as he failed SSS in ED, SLP ordered/.  Pt and family updated about plan of care, will monitor.

## 2014-05-24 NOTE — ED Notes (Signed)
Suctioned small amount of mucous due to patient coughing.

## 2014-05-24 NOTE — ED Notes (Signed)
Attempted to call report to 4N earlier.  RN called back for report, but was unable to get this RN.   4N advised US that they will callback to ED for report.

## 2014-05-24 NOTE — H&P (Signed)
[facility] History and Physical                                                                                    Brian Mclaughlin, is a 69 y.o. male  MRN: [id number]   DOB - 1946-01-15  Admit Date - 05/24/2014  Outpatient Primary MD for the patient is No PCP Per Patient  With History of -  Past Medical History  Diagnosis Date  . Hypertension   . Tobacco abuse   . ETOH abuse   . Hyperlipidemia   . CHF (congestive heart failure)   . Alcohol abuse   . Tobacco use disorder   . Atrial fibrillation   . Acute kidney failure, unspecified   . Rhabdomyolysis   . Rhabdomyolysis   . Altered mental status   . Nonspecific elevation of levels of transaminase or lactic acid dehydrogenase (LDH)   . Acute, but ill-defined, cerebrovascular disease   . CVA (cerebral infarction)       Past Surgical History  Procedure Laterality Date  . Eye muscle surgery      as a teenager "tighten his muscles"  . Back surgery  10 years ago    "slipped disc" repair    in for   Chief Complaint  Patient presents with  . Code Stroke     HPI  Brian Mclaughlin  is a 69 y.o. 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 He presents to the ER on 05/24/2014 with right-sided facial droop, dysarthria and right-sided weakness. [name] is difficult to understand due to dysarthria but his friend Brian Mclaughlin is at bedside. She reports that she called her at 3 AM this morning. She could not understand what he was saying and immediately called EMS. [name] is able to convey that his symptoms started at about 11:30 PM last evening. He ignored the symptoms and went to bed. He reports feeling well otherwise and denies fever, cough, congestion, diarrhea, any pain. He admits to continued smoking and occasionally drinking beer. Brian Mclaughlin mentions that he lives alone he  has no family nearby but does have a daughter that lives out of state.  He is a DO NOT RESUSCITATE.  Review of Systems   Difficult to obtain from the patient as he has severe dysarthria. As noted above.  Social History History  Substance Use Topics  . Smoking status: Current Every Day Smoker -- 0.50 packs/day for 45 years    Types: Cigarettes  . Smokeless tobacco: Not on file  . Alcohol Use: 0.0 oz/week     Comment: Drinks 6-8 beers daily    Family History Family History  Problem Relation Age of Onset  . Cancer Mother   . Cancer Father   . Multiple sclerosis Daughter     Prior to Admission medications   Medication Sig Start Date End Date Taking? Authorizing Provider  acetaminophen (TYLENOL) 500 MG tablet Take 1 tablet (500 mg total) by mouth every 6 (six) hours as needed for pain. Patient not taking: Reported on  05/24/2014 02/02/12   Warrick Parisian, MD  carvedilol (COREG) 6.25 MG tablet Take 1 tablet (6.25 mg total) by mouth 2 (two) times daily with a meal. Patient not taking: Reported on 05/24/2014 09/01/11 08/31/12  Lorretta Harp, MD  ofloxacin (FLOXIN) 0.3 % otic solution Place 5 drops into the right ear daily. Patient not taking: Reported on 05/24/2014 12/13/12   Peyton Najjar, MD  predniSONE (DELTASONE) 20 MG tablet Take 2 pills daily for 3 days, then one pill daily for 3 days Patient not taking: Reported on 05/24/2014 12/13/12   Peyton Najjar, MD  Rivaroxaban 20 MG TABS Take 20 mg by mouth daily. Patient not taking: Reported on 05/24/2014 09/01/11   Lorretta Harp, MD    Allergies  Allergen Reactions  . Codeine Rash  . Penicillins Rash    Physical Exam  Vitals  Blood pressure 178/110, pulse 62, temperature 98.1 F (36.7 C), temperature source Oral, resp. rate 20, height 6' (1.829 m), weight 114.76 kg (253 lb), SpO2 96 %.   General:  Well-developed, overweight, male, lying in bed, no apparent distress, friendly at that side.   Psych:  Normal affect and insight, Not Suicidal or  Homicidal, Awake Alert, Oriented X 3.  Neuro:   right-sided facial droop, significant problems forming words, right-sided upper extremity weakness.   ENT:  Eyes do not focus in the same direction., Conjunctivae clear, PER. Moist oral mucosa without erythema or exudates.  Neck:  Supple, No lymphadenopathy appreciated  Respiratory:  Symmetrical chest wall movement, decreased air movement in the lower right. No wheezes crackles or rhonchi. Patient spontaneously coughs when swallowing saliva.   Cardiac: irregularly irregular, No Murmurs, 1+ lower extremity edema right greater than left.   Abdomen:  Positive bowel sounds, Soft, Non tender, Non distended,  No masses appreciated  Skin:  No Cyanosis, Normal Skin Turgor, No Skin Rash or Bruise.  Extremities:  Able to move all 4. 5/5 strength in each.  However right upper extremity noticeably weaker than left.  no effusions.  Data Review  CBC  Recent Labs Lab 05/24/14 0510 05/24/14 0518  WBC 9.0  --   HGB 14.3 16.3  HCT 41.9 48.0  PLT 128*  --   MCV 99.5  --   MCH 34.0  --   MCHC 34.1  --   RDW 12.8  --   LYMPHSABS 1.8  --   MONOABS 0.7  --   EOSABS 0.2  --   BASOSABS 0.1  --     Chemistries   Recent Labs Lab 05/24/14 0510 05/24/14 0518  NA 138 139  K 4.1 4.1  CL 105 104  CO2 27  --   GLUCOSE 103* 99  BUN 18 19  CREATININE 2.04* 2.00*  CALCIUM 8.9  --   AST 20  --   ALT 12  --   ALKPHOS 62  --   BILITOT 0.6  --     Coagulation profile  Recent Labs Lab 05/24/14 0510  INR 1.00     Urinalysis    Component Value Date/Time   COLORURINE YELLOW 05/24/2014 0548   APPEARANCEUR CLEAR 05/24/2014 0548   LABSPEC 1.014 05/24/2014 0548   PHURINE 5.0 05/24/2014 0548   GLUCOSEU NEGATIVE 05/24/2014 0548   HGBUR MODERATE* 05/24/2014 0548   BILIRUBINUR NEGATIVE 05/24/2014 0548   KETONESUR NEGATIVE 05/24/2014 0548   PROTEINUR NEGATIVE 05/24/2014 0548   UROBILINOGEN 0.2 05/24/2014 0548   NITRITE NEGATIVE 05/24/2014  0548   LEUKOCYTESUR NEGATIVE 05/24/2014 0548  Imaging results:   Ct Head Wo Contrast  05/24/2014   CLINICAL DATA:  Code stroke. Right facial droop, right arm weakness, slurred speech.  EXAM: CT HEAD WITHOUT CONTRAST  TECHNIQUE: Contiguous axial images were obtained from the base of the skull through the vertex without intravenous contrast.  COMPARISON:  02/02/2012  FINDINGS: No intracranial hemorrhage, mass effect, or midline shift. No hydrocephalus. The basilar cisterns are patent. Small subcentimeter focus of hypodensity in the left occipital lobe, may reflect new site of ischemia versus volume averaging. Encephalomalacia in the left frontal lobe, unchanged from prior exam. Moderate chronic small vessel ischemic change. Unchanged lacunar infarct in the right caudate. No intracranial fluid collection. Calvarium is intact. Chronic opacification of scattered ethmoid air cells and mucosal thickening left frontal sinus. Scattered opacification of lower mastoid air cells, improved on the right.  IMPRESSION: 1. Small subcentimeter focus of hypodensity in the left occipital lobe, may reflect new site of ischemia versus volume averaging. 2. Encephalomalacia in the left frontal lobe. Chronic small vessel ischemic change and remote lacunar infarct in the right caudate.   Electronically Signed   By: Rubye Oaks M.D.   On: 05/24/2014 05:31    My personal review of EKG: irreg irreg.   Rate controlled.   Assessment & Plan  Active Problems:   Stroke   HTN (hypertension)   Alcohol abuse   Atrial fibrillation   Acute CVA (cerebrovascular accident)   Acute CVA. Patient with afib and hyptertension.  Not taking medications.  He reports the medications make him feel badly. Small left occipital lobe lesion noted on CT.  Outside the window for thrombolytics. Neurology consulted.  Will admit to neuro tele under ischemic stroke admission orders. Significant dysarthria.  I am concerned for aspiration. Will  likely need SNF on discharge.  Lives alone at home.  Acute on chronic renal failure Baseline creatinine appears to be approximately 1.5.  Currently at 2.0 Secondary to ?  Alcohol use?  Dehydration? Will give gentle IVF and monitor creatinine.   Concerned for volume overload given low EF.  Systolic heart failure Most recent echo in [facility] 08/2011.  Will update.  EF was 25 - 30%.  Diffuse/Severe Hypokinesis. Not on medications at home. Consider lasix and low dose beta-blocker initiation prior to d/c as appropriate.  Atrial Fibrillation Rate controlled at this point - not on BB. Will await recommendations from neurology regarding appropriate time to restart anticoagulation. He presents with significant neurological deficits and may have large stroke on MRI, increasing risk of hemorrhagic conversion.    Hypertension 178/110 in ER.  Will allow for permissive hypertension given acute stroke. PRN IV hydralazine. Would start lowest dose of coreg prior to d/c.  Tobacco abuse Will place nicotine patch in house.  Alcohol use Patient and his friend Barnie Del) deny that he withdraws when not drinking.  Will not order CIWA but will order PRN ativan in case he begins to with draw. Please order folate and thiamine when he is able to take POs.   DVT Prophylaxis: Heparin.  Then Eliquis per Pharmacy.  AM Labs Ordered, also please review Full Orders  Family Communication:   Friend at bedside.  Reportedly he has a daughter.  We need to obtain contact information for her.  Code Status:  DNR.  Condition:  Guarded.  Time spent in minutes : [address],  PA-C on 05/24/2014 at 7:13 AM  Between 7am to 7pm - Pager - [phone]  After 7pm go to [email or web address] -  password [email or web address]  And look for the night coverage person covering me after hours  [facility]   Addendum  I personally evaluated patient on 05/24/2014 and agree with the above findings. Mr. Doylene Canning is a pleasant 69 year old  gentleman with a past medical history of hypertension, tobacco abuse, dyslipidemia, presenting to the emergency department [facility] with complaints of dysarthria associated right-sided weakness. Unfortunately patient has not been taking any of his medications over the past 2 years. He was noted to have significant dysarthria, right-sided facial droop, right-sided weakness on initial evaluation in the emergency department. He was seen and evaluated by neurology. Patient outside window for TPA. CT scan of brain revealed small subsegmental focus of hypodensity in the left occipital lobe reflecting new set of ischemia. This may have been secondary to thromboembolic event given history of atrial fibrillation and not being on chronic anticoagulation. On physical exam patient having significant dysarthria with right-sided facial droop. Had 3 of 5 muscle strength to right upper and right lower extremity. Will admit patient to the neuro floor, place him on CVA protocol. Obtain MRI of brain, transthoracic echocardiogram, carotid Dopplers, PT/OT, speech pathology and social work consultations. Await further recommendations from Neurology regarding restarting anticoagulation given significant neurological deficits.

## 2014-05-24 NOTE — Progress Notes (Signed)
Received call from 4N RN that patient had a pulse rate of 29, but vacillates between 29 - 130.  (in Afib).  Also BP elevated - but patient admitted with acute stroke. Stopped IV metoprolol order.  Transferred patient to step down.  Will consult  Cardiology.  Algis DownsMarianne York, PA-C [facility] Pager: [phone]

## 2014-05-24 NOTE — Progress Notes (Signed)
MD ordered stepdown transfer.  RR and AC aware.

## 2014-05-24 NOTE — Progress Notes (Signed)
Code stroke called on 69 y.o male with acute onset of right side weakness, right side facial droop and dysarthria. Pt noticed his symptoms at 2330 tonight, went to sleep and woke up this morning to call a friend who called EMS. Pertinent history includes AFIB, HTN, tobacco/ETOH abuse, hyperlipidemia, CHF and CVA 2013 with vertigo residual. Pt is non compliant with medications, admits to not taking HTN meds or blood thinners in about 6 months. He also does not see his PCP regularly. CBG 100. CT scan done STAT, per Neurologist it showed no acute intracranial hemorrhage. However a subcentimeter focus of hypodensity was noted in the left occipital lobe, possibly a small acute stroke. NIHSS completed yielding 4 for right facial droop, ataxia in RUE, and severe dysarthria. Pt will not receive TPA as he is well outside the window. Pt for admit tonight for full stroke work up.

## 2014-05-24 NOTE — Progress Notes (Addendum)
Pt BP 175/117, and in A fib ranging from the 140's to the low 30's.  Taken manually at radial wrist, HR 52 and very irregular.  MD paged. Hydralazine 5 mg verbal order given. Cardiology to be consulted.  Will monitor closely.

## 2014-05-24 NOTE — Progress Notes (Signed)
Echocardiogram 2D Echocardiogram has been performed.  Brian Mclaughlin 05/24/2014, 3:41 PM

## 2014-05-24 NOTE — Progress Notes (Signed)
This note also relates to the following rows which could not be included: Pulse Rate - Cannot attach notes to unvalidated device data Resp - Cannot attach notes to unvalidated device data SpO2 - Cannot attach notes to unvalidated device data     05/24/14 1649  Respiratory Assessment  Assessment Type Assess only  Respiratory Pattern Regular  Chest Assessment Chest expansion symmetrical  Cough Non-productive;Weak  Bilateral Breath Sounds Clear  R Upper  Breath Sounds Clear  L Upper Breath Sounds Clear  R Lower Breath Sounds Rhonchi  L Lower Breath Sounds Rhonchi  Oxygen Therapy/Pulse Ox  O2 Device Room Air  Patient encouraged to deep breath and cough.

## 2014-05-24 NOTE — Progress Notes (Signed)
Report given to 3S RN, pt being transported to 3S09.

## 2014-05-25 DIAGNOSIS — I5021 Acute systolic (congestive) heart failure: Secondary | ICD-10-CM

## 2014-05-25 DIAGNOSIS — I6340 Cerebral infarction due to embolism of unspecified cerebral artery: Secondary | ICD-10-CM

## 2014-05-25 DIAGNOSIS — I5022 Chronic systolic (congestive) heart failure: Secondary | ICD-10-CM

## 2014-05-25 DIAGNOSIS — Z9114 Patient's other noncompliance with medication regimen: Secondary | ICD-10-CM

## 2014-05-25 DIAGNOSIS — I63419 Cerebral infarction due to embolism of unspecified middle cerebral artery: Secondary | ICD-10-CM

## 2014-05-25 LAB — LIPID PANEL
Cholesterol: 158 mg/dL (ref 0–200)
HDL: 51 mg/dL (ref >39)
LDL CALC: 82 mg/dL (ref 0–99)
Total CHOL/HDL Ratio: 3.1 RATIO
Triglycerides: 125 mg/dL (ref <150)
VLDL: 25 mg/dL (ref 0–40)

## 2014-05-25 MED ORDER — SODIUM CHLORIDE 0.9 % IV SOLN
INTRAVENOUS | Status: AC
  Administered 2014-05-25: 20:00:00 via INTRAVENOUS

## 2014-05-25 MED ORDER — CETYLPYRIDINIUM CHLORIDE 0.05 % MT LIQD
7.0000 mL | Freq: Two times a day (BID) | OROMUCOSAL | Status: DC
  Administered 2014-05-26 – 2014-05-29 (×6): 7 mL via OROMUCOSAL

## 2014-05-25 NOTE — Progress Notes (Addendum)
Rehab admissions - I met with pt in follow up to rehab MD consult to explain the possibility of inpatient rehab. Pt's communication was limited by dysarthia and he did his best to answer some baseline questions. Further information was given about our rehab program and informational brochures were left with pt.  I asked about available family/friend support and pt stated he has no children (however, Rn states he has a dtr that lives out of state). I mentioned the contact listed in Epic named Thornton Papas and he was not able to clearly tell me how available she would be for help. Rehab MD is projecting that pt would need supervision up to being modified independent at the end of a rehab stay.  I then called and left a voicemail with Elmo Putt. Elmo Putt then returned my call and shared that she is a friend of the pt and would not be able to provide supervision support as she is about to start two jobs next week. Elmo Putt shared that pt does have a dtr named [name] who lives in [place]. Elmo Putt is to give me dtr's number shortly.  I will need to clarify with pt's dtr if there is any other available family/friend support to provide supervision.  I will share updates as I can. Thanks.  Nanetta Batty, PT Rehabilitation Admissions Coordinator [phone]   Addendum:  I did receive contact information for pt's dtr Deckard Stuber, who lives in [place] (phone: [phone]) and discussed with her the possibility of inpatient rehab. Dtr is supportive but cannot provide any needed assistance as she lives and works out of state. Dtr is not able to have pt come live with her at the end of a possible rehab stay.  I went back to speak with pt about these conversations and pt is reluctant to consider SNF. I also explained that there will be [facility] Medicare copays to come to CIR and pt shared that he may not be able to afford those. I shared that his dtr and friend are not able to provide needed supervision.   At this point, SNF  seems the most reasonable option in light of decreased family support and it is not likely that [facility] Medicare would give authorization for both CIR followed by SNF admission.  Pt wanted me to ask his brother Durene Fruits if pt could go live with him after a possible inpatient rehab admit. Vance's contact: [phone]. I will call pt's brother shortly.  I updated Dysheka, social worker of the above.  I will reach out to pt's brother and share an update tomorrow. It is likely that SNF is the most appropriate DC plan but I will clarify this tomorrow. Pt was asked to think about his options tonight.  Thanks.  Nanetta Batty, PT Rehabilitation Admissions Coordinator [phone]

## 2014-05-25 NOTE — Progress Notes (Signed)
STROKE TEAM PROGRESS NOTE   HISTORY Brian Mclaughlin is an 69 y.o. male with a history of hypertension, previous stroke, alcohol abuse, atrial fibrillation and hyperlipidemia, presenting with new onset right facial droop and slurred speech. He was last known well at 11:30 PM 05/22/2013. Deficits improved since onset. Patient has been noncompliant with taking medications including anticoagulation with Brian Mclaughlin, as well as Brian Mclaughlin and antihypertensive medication. CT scan of his head showed no acute intracranial hemorrhage. A subcentimeter focus of hypodensity was noted in the left occipital lobe, possibly a small acute stroke. There was encephalomalacia in the left frontal lobe from previous stroke. NIH stroke score was 4. Patient was beyond time window for treatment with IV TPA. Patient was not administered TPA secondary to delay in arrival. He was admitted for further evaluation and treatment.   SUBJECTIVE (INTERVAL HISTORY) Multiple family members are at the bedside.  He was transferred to 3 S. Due to bradycardia and atrial fibrillation control   OBJECTIVE Temp:  [97.4 F (36.3 C)-98.7 F (37.1 C)] 98.1 F (36.7 C) (03/10 1247) Pulse Rate:  [33-98] 47 (03/10 1000) Cardiac Rhythm:  [-] Atrial fibrillation (03/10 0830) Resp:  [0-19] 18 (03/10 1000) BP: (142-181)/(79-121) 156/95 mmHg (03/10 1000) SpO2:  [92 %-100 %] 99 % (03/10 1000)  No results for input(s): GLUCAP in the last 168 hours.  Recent Labs Lab 05/24/14 0510 05/24/14 0518  NA 138 139  K 4.1 4.1  CL 105 104  CO2 27  --   GLUCOSE 103* 99  BUN 18 19  CREATININE 2.04* 2.00*  CALCIUM 8.9  --     Recent Labs Lab 05/24/14 0510  AST 20  ALT 12  ALKPHOS 62  BILITOT 0.6  PROT 6.9  ALBUMIN 3.3*    Recent Labs Lab 05/24/14 0510 05/24/14 0518  WBC 9.0  --   NEUTROABS 6.2  --   HGB 14.3 16.3  HCT 41.9 48.0  MCV 99.5  --   PLT 128*  --    No results for input(s): CKTOTAL, CKMB, CKMBINDEX, TROPONINI in the last 168  hours.  Recent Labs  05/24/14 0510  LABPROT 13.3  INR 1.00    Recent Labs  05/24/14 0548  COLORURINE YELLOW  LABSPEC 1.014  PHURINE 5.0  GLUCOSEU NEGATIVE  HGBUR MODERATE*  BILIRUBINUR NEGATIVE  KETONESUR NEGATIVE  PROTEINUR NEGATIVE  UROBILINOGEN 0.2  NITRITE NEGATIVE  LEUKOCYTESUR NEGATIVE       Component Value Date/Time   CHOL 158 05/25/2014 0526   TRIG 125 05/25/2014 0526   HDL 51 05/25/2014 0526   CHOLHDL 3.1 05/25/2014 0526   VLDL 25 05/25/2014 0526   LDLCALC 82 05/25/2014 0526   Lab Results  Component Value Date   HGBA1C 5.3 08/23/2011      Component Value Date/Time   LABOPIA NONE DETECTED 05/24/2014 0548   COCAINSCRNUR NONE DETECTED 05/24/2014 0548   LABBENZ NONE DETECTED 05/24/2014 0548   AMPHETMU NONE DETECTED 05/24/2014 0548   THCU NONE DETECTED 05/24/2014 0548   LABBARB NONE DETECTED 05/24/2014 0548     Recent Labs Lab 05/24/14 0510  ETH <5    Ct Head Wo Contrast  05/24/2014   CLINICAL DATA:  Code stroke. Right facial droop, right arm weakness, slurred speech.  EXAM: CT HEAD WITHOUT CONTRAST  TECHNIQUE: Contiguous axial images were obtained from the base of the skull through the vertex without intravenous contrast.  COMPARISON:  02/02/2012  FINDINGS: No intracranial hemorrhage, mass effect, or midline shift. No hydrocephalus. The basilar cisterns  are patent. Small subcentimeter focus of hypodensity in the left occipital lobe, may reflect new site of ischemia versus volume averaging. Encephalomalacia in the left frontal lobe, unchanged from prior exam. Moderate chronic small vessel ischemic change. Unchanged lacunar infarct in the right caudate. No intracranial fluid collection. Calvarium is intact. Chronic opacification of scattered ethmoid air cells and mucosal thickening left frontal sinus. Scattered opacification of lower mastoid air cells, improved on the right.  IMPRESSION: 1. Small subcentimeter focus of hypodensity in the left occipital lobe,  may reflect new site of ischemia versus volume averaging. 2. Encephalomalacia in the left frontal lobe. Chronic small vessel ischemic change and remote lacunar infarct in the right caudate.   Electronically Signed   By: Rubye Oaks M.D.   On: 05/24/2014 05:31   Mr Brain Wo Contrast  05/24/2014   CLINICAL DATA:  Initial evaluation for acute onset right facial droop with slurred speech.  EXAM: MRI HEAD WITHOUT CONTRAST  MRA HEAD WITHOUT CONTRAST  TECHNIQUE: Multiplanar, multiecho pulse sequences of the brain and surrounding structures were obtained without intravenous contrast. Angiographic images of the head were obtained using MRA technique without contrast.  COMPARISON:  None.  FINDINGS: MRI HEAD FINDINGS  Diffuse prominence of the CSF containing spaces is compatible with generalized cerebral atrophy. Patchy and confluent T2/FLAIR hyperintensity within the periventricular and deep white matter both cerebral hemispheres most consistent with chronic small vessel ischemic changes.  Remote lacunar infarcts involving the periventricular white matter of the right corona radiata as well as the right pons present.  There is an abnormal focus of restricted diffusion involving the posterior limb of the left internal capsule, measuring approximately in 0.9 x 1.0 x 1.9 cm, compatible with an acute ischemic infarct. No associated hemorrhage or mass effect. Delete that  There are additional smaller infarcts involving the deep white matter of the right frontoparietal region (series 7, image 12). Additional tiny cortical infarct within the high left parietal lobe (series 7, image 11).  No mass lesion or midline shift. Ventricular prominence related to global parenchymal volume loss present without hydrocephalus. No extra-axial fluid collection.  Craniocervical junction within normal limits. Prominent degenerative changes noted within the visualized upper cervical spine. Pituitary gland normal.  No acute abnormality seen  about the orbits.  Minimal scattered opacity noted within the anterior ethmoidal air cells bilaterally. Paranasal sinuses are otherwise clear. Scattered fluid signal intensity present within the mastoid air cells bilaterally, left greater than right.  Lumbar signal intensity is normal. Scalp soft tissues within normal limits.  MRA HEAD FINDINGS  ANTERIOR CIRCULATION:  Study is degraded by motion artifact.  Visualized distal cervical segments of the internal carotid arteries are patent bilaterally with antegrade flow. The right internal carotid artery is diminutive as compared to the left. The petrous segments are patent without high-grade stenosis. There is question of focal high-grade stenosis within knee anterior genu of the cavernous right ICA versus motion artifact (series 5, image 69). Otherwise, the cavernous and supra clinoid segments are patent without definite high-grade stenosis. Right A1 segment is hypoplastic or absent, likely accounting for the size difference in the internal carotid arteries. The left A1 segment is widely patent. Anterior communicating artery and anterior cerebral arteries well opacified.  M1 segments widely patent bilaterally without focal stenosis or occlusion. MCA bifurcations within normal limits. Probable distal branch atherosclerotic irregularity present within the MCA branches bilaterally.  POSTERIOR CIRCULATION:  Left vertebral artery is dominant. Vertebral arteries are widely patent to the vertebrobasilar junction. Posterior  inferior cerebral arteries grossly patent proximally. Basilar artery is mildly tortuous but widely patent without focal stenosis or occlusion. Superior cerebellar arteries are patent proximally. P1 and P2 segments are patent bilaterally. There is distal branch atheromatous irregularity within the posterior cerebral arteries bilaterally.  No aneurysm or vascular malformation.  IMPRESSION: MRI HEAD IMPRESSION:  1. Acute ischemic infarct involving the  posterior limb of the left internal capsule as above. No associated hemorrhage or mass effect. 2. Additional smaller ischemic infarcts involving the deep white matter of the right frontal parietal region, with additional tiny cortical infarct within the high left parietal lobe. Given the various vascular distributions, central thromboembolic etiology could be considered. 3. Remote lacunar infarcts involving the right basal ganglia/ corona radiata and right pons. 4. Generalized cerebral atrophy with moderate chronic microvascular ischemic disease.  MRA HEAD IMPRESSION:  1. Motion degraded study.  No proximal branch occlusion identified. 2. Question focal high-grade stenosis within the anterior genu of the cavernous right ICA. This finding may in part be related to extensive motion artifact through this region on this exam. 3. Hypoplastic/absent right A1 segment, with the anterior cerebral artery supplied via the left internal carotid artery. As a result com on the right ICA is asymmetrically small as compared to the left. 4. Distal branch atherosclerotic irregularity within the MCA and PCA branches bilaterally.   Electronically Signed   By: Rise Mu M.D.   On: 05/24/2014 22:53   Dg Chest Port 1 View  05/24/2014   CLINICAL DATA:  Cough  EXAM: PORTABLE CHEST - 1 VIEW  COMPARISON:  02/02/2012  FINDINGS: Cardiac shadow is enlarged. The lungs are well aerated bilaterally. No focal infiltrate or sizable effusion is seen. No bony abnormality is noted.  IMPRESSION: No active disease.   Electronically Signed   By: Alcide Clever M.D.   On: 05/24/2014 13:54   Mr [name] Head/brain Wo Cm  05/24/2014   CLINICAL DATA:  Initial evaluation for acute onset right facial droop with slurred speech.  EXAM: MRI HEAD WITHOUT CONTRAST  MRA HEAD WITHOUT CONTRAST  TECHNIQUE: Multiplanar, multiecho pulse sequences of the brain and surrounding structures were obtained without intravenous contrast. Angiographic images of the head were  obtained using MRA technique without contrast.  COMPARISON:  None.  FINDINGS: MRI HEAD FINDINGS  Diffuse prominence of the CSF containing spaces is compatible with generalized cerebral atrophy. Patchy and confluent T2/FLAIR hyperintensity within the periventricular and deep white matter both cerebral hemispheres most consistent with chronic small vessel ischemic changes.  Remote lacunar infarcts involving the periventricular white matter of the right corona radiata as well as the right pons present.  There is an abnormal focus of restricted diffusion involving the posterior limb of the left internal capsule, measuring approximately in 0.9 x 1.0 x 1.9 cm, compatible with an acute ischemic infarct. No associated hemorrhage or mass effect. Delete that  There are additional smaller infarcts involving the deep white matter of the right frontoparietal region (series 7, image 12). Additional tiny cortical infarct within the high left parietal lobe (series 7, image 11).  No mass lesion or midline shift. Ventricular prominence related to global parenchymal volume loss present without hydrocephalus. No extra-axial fluid collection.  Craniocervical junction within normal limits. Prominent degenerative changes noted within the visualized upper cervical spine. Pituitary gland normal.  No acute abnormality seen about the orbits.  Minimal scattered opacity noted within the anterior ethmoidal air cells bilaterally. Paranasal sinuses are otherwise clear. Scattered fluid signal intensity present within the  mastoid air cells bilaterally, left greater than right.  Lumbar signal intensity is normal. Scalp soft tissues within normal limits.  MRA HEAD FINDINGS  ANTERIOR CIRCULATION:  Study is degraded by motion artifact.  Visualized distal cervical segments of the internal carotid arteries are patent bilaterally with antegrade flow. The right internal carotid artery is diminutive as compared to the left. The petrous segments are patent  without high-grade stenosis. There is question of focal high-grade stenosis within knee anterior genu of the cavernous right ICA versus motion artifact (series 5, image 69). Otherwise, the cavernous and supra clinoid segments are patent without definite high-grade stenosis. Right A1 segment is hypoplastic or absent, likely accounting for the size difference in the internal carotid arteries. The left A1 segment is widely patent. Anterior communicating artery and anterior cerebral arteries well opacified.  M1 segments widely patent bilaterally without focal stenosis or occlusion. MCA bifurcations within normal limits. Probable distal branch atherosclerotic irregularity present within the MCA branches bilaterally.  POSTERIOR CIRCULATION:  Left vertebral artery is dominant. Vertebral arteries are widely patent to the vertebrobasilar junction. Posterior inferior cerebral arteries grossly patent proximally. Basilar artery is mildly tortuous but widely patent without focal stenosis or occlusion. Superior cerebellar arteries are patent proximally. P1 and P2 segments are patent bilaterally. There is distal branch atheromatous irregularity within the posterior cerebral arteries bilaterally.  No aneurysm or vascular malformation.  IMPRESSION: MRI HEAD IMPRESSION:  1. Acute ischemic infarct involving the posterior limb of the left internal capsule as above. No associated hemorrhage or mass effect. 2. Additional smaller ischemic infarcts involving the deep white matter of the right frontal parietal region, with additional tiny cortical infarct within the high left parietal lobe. Given the various vascular distributions, central thromboembolic etiology could be considered. 3. Remote lacunar infarcts involving the right basal ganglia/ corona radiata and right pons. 4. Generalized cerebral atrophy with moderate chronic microvascular ischemic disease.  MRA HEAD IMPRESSION:  1. Motion degraded study.  No proximal branch occlusion  identified. 2. Question focal high-grade stenosis within the anterior genu of the cavernous right ICA. This finding may in part be related to extensive motion artifact through this region on this exam. 3. Hypoplastic/absent right A1 segment, with the anterior cerebral artery supplied via the left internal carotid artery. As a result com on the right ICA is asymmetrically small as compared to the left. 4. Distal branch atherosclerotic irregularity within the MCA and PCA branches bilaterally.   Electronically Signed   By: Rise Mu M.D.   On: 05/24/2014 22:53     PHYSICAL EXAM Frail elderly male not in distress. . Afebrile. Head is nontraumatic. Neck is supple without bruit.    Cardiac exam no murmur or gallop. Lungs are clear to auscultation. Distal pulses are well felt. Neurological Exam : Awake alert mild expressive aphasia with nonfluent speech and word finding difficulties. Good comprehension. Diminished naming and repetition. Extraocular movements are full range without nystagmus. Fundi were not visualized. Vision acuity and fields seem adequate. The right lower facial weakness. Tongue midline. Motor system exam revealed no upper or lower extremity drift but mild weakness of the right grip and intrinsic hand muscles and right hip flexors. Orbits left over right upper extremity. Diminished fine finger movements on the right. ASSESSMENT/PLAN Mr. MARRIS FRONTERA is a 69 y.o. male with history of hypertension, previous stroke, alcohol abuse, atrial fibrillation and hyperlipidemia presenting with slurred speech and right facial droop. He did not receive IV t-PA due to delay in arrival.  Stroke:  Probable Left brain stroke, likely MCA territory, felt to be embolic secondary to known atrial fibrillation. MRI pending  Resultant  Right facial weakness, dysarthria  MRI  pending  MRA  Pending  Repeat CT not indicated unless MRI unable to be completed.  Carotid Doppler Preliminary findings:  Bilateral: 1-39% ICA stenosis. Vertebral artery flow is antegrade.   2D Echo Poor acoustical windows limits adequate assessment of EF or regional wall motion abnormalities. Recommend repeat limited study with definity contrast. The cavity size was normal. There was moderate focal basal and mild concentric hypertrophy. Systolic function was normal. The estimated ejection fraction was in the range of 50% to 55%. Images were inadequate for LV wall motion assessment.  HgbA1c pending , goal < 7.0  Heparin 5000 units sq tid for VTE prophylaxis  Diet NPO time specified   xarelto ( rivaroxaban) ordered but pt non-compliant prior to admission, now on no antithrombotic. As NPO, will go ahead and start aspirin 300 mg. Recommend continuation of NOAC once able to swallow.  Ongoing aggressive stroke risk factor management  Therapy recommendations:  rehab  Disposition:  pending   Atrial Fibrillation  Home meds:  Xarelto, noncompliant  Rate uncontrolled, transferred to 3S. Cardiology consulted   Hypertension  Slightly elevated  Permissive hypertension (OK if < 220/120) but gradually normalize in 5-7 days   Hyperlipidemia  Home meds:  No statin listed  LDL pending , goal < 70  Other Stroke Risk Factors  Advanced age  Cigarette smoker, advised to stop smoking  ETOH use  Obesity, Body mass index is 32.58 kg/(m^2).   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 Known low EF 25-30% with wheezing now, have discontinued IVF  Acute on chronic renal failure  Other Pertinent History  Pt is a DNR  Hospital day # 1  Cailyn Houdek  [facility] See Amion for Pager  information 05/25/2014 1:12 PM  I have personally examined this patient, reviewed notes, independently viewed imaging studies, participated in medical decision making and plan of care. I have made any additions or clarifications directly to the above note.   He presented with aphasia and dysarthria and right facial weakness likely from a left MCA branch infarct of embolic etiology from atrial fibrillation. He has a long-standing history of noncompliance with medications. He remains at risk for recurrent strokes and TIAs and neurological worsening. Continue Aspirin for now. and need to carefully  Weigh risk  benefitis carefully as well his compliance with starting anticoagulation.Mobilize out of bed and therapy consults.Stroke team will sign off Call for questions.  Delia Heady, MD Medical Director [facility] Pager: [phone] 05/25/2014 1:12 PM    To contact Stroke Continuity provider, please refer to [email or web address]. After hours, contact General Neurology

## 2014-05-25 NOTE — Consult Note (Signed)
Physical Medicine and Rehabilitation Consult Reason for Consult: Left MCA infarct Referring Physician: [facility]   HPI: Brian Mclaughlin is a 69 y.o. right handed male with history of hypertension, tobacco and alcohol abuse, chronic diastolic congestive heart failure, chronic renal insufficiency as well as cardiomyopathy, atrial fibrillation maintained on Xarelto with medical noncompliance. Independent prior to admission living alone with very little family assistance in the area. Presented 05/24/2014 with acute onset of slurred speech and right facial droop. MRI of the brain shows acute ischemic infarct posterior limb of the left internal capsule with additional smaller ischemic infarct deep white matter of the right frontal parietal region remote lacunar infarct right basal ganglia. MRA of the head with no proximal branch occlusion. Echocardiogram with ejection fraction 55% no wall motion abnormalities. Patient did not receive TPA. Cardiology consulted for tachycardia/ bradycardia with workup ongoing question plan anticoagulation currently on aspirin therapy as well as subcutaneous heparin. Nicoderm patch added for tobacco abuse. Patient noted to be very ataxic poor awareness of deficits. Physical therapy evaluation completed 05/25/2014 with recommendations of physical medicine rehabilitation consult.  Severe dysarthria Pt states he went to SNF after last CVA which affected Left side, he does not want to go back to a SNF Review of Systems  Cardiovascular: Positive for palpitations.  Neurological: Positive for weakness.  All other systems reviewed and are negative.  Past Medical History  Diagnosis Date  . Hypertension   . Tobacco abuse   . ETOH abuse   . Hyperlipidemia   . Chronic systolic CHF (congestive heart failure)   . Alcohol abuse   . Tobacco use disorder   . Atrial fibrillation   . Acute kidney failure, unspecified   . Rhabdomyolysis   . Altered mental status   . Nonspecific  elevation of levels of transaminase or lactic acid dehydrogenase (LDH)   . Acute, but ill-defined, cerebrovascular disease   . CVA (cerebral infarction)    Past Surgical History  Procedure Laterality Date  . Eye muscle surgery      as a teenager "tighten his muscles"  . Back surgery  10 years ago    "slipped disc" repair   Family History  Problem Relation Age of Onset  . Cancer Mother   . Cancer Father   . Multiple sclerosis Daughter    Social History:  reports that he has been smoking Cigarettes.  He has a 22.5 pack-year smoking history. He does not have any smokeless tobacco history on file. He reports that he drinks alcohol. He reports that he does not use illicit drugs. Allergies:  Allergies  Allergen Reactions  . Codeine Rash  . Penicillins Rash   Medications Prior to Admission  Medication Sig Dispense Refill  . acetaminophen (TYLENOL) 500 MG tablet Take 1 tablet (500 mg total) by mouth every 6 (six) hours as needed for pain. (Patient not taking: Reported on 05/24/2014) 30 tablet 0  . carvedilol (COREG) 6.25 MG tablet Take 1 tablet (6.25 mg total) by mouth 2 (two) times daily with a meal. (Patient not taking: Reported on 05/24/2014) 60 tablet 5  . ofloxacin (FLOXIN) 0.3 % otic solution Place 5 drops into the right ear daily. (Patient not taking: Reported on 05/24/2014) 5 mL 0  . predniSONE (DELTASONE) 20 MG tablet Take 2 pills daily for 3 days, then one pill daily for 3 days (Patient not taking: Reported on 05/24/2014) 9 tablet 0  . Rivaroxaban 20 MG TABS Take 20 mg by mouth daily. (Patient  not taking: Reported on 05/24/2014) 30 tablet 6    Home: Home Living Family/patient expects to be discharged to:: Inpatient rehab  Functional History: Prior Function Level of Independence: Independent Functional Status:  Mobility: Bed Mobility Overal bed mobility: Needs Assistance Bed Mobility: Supine to Sit, Sit to Supine Supine to sit: Mod assist, HOB elevated Sit to supine: Min  assist General bed mobility comments: pt needs A to bring trunk up to sitting and to scoot hips to EOB.   Transfers Overall transfer level: Needs assistance Equipment used: 1 person hand held assist, 2 person hand held assist Transfers: Sit to/from Stand Sit to Stand: Min assist, Mod assist General transfer comment: pt needed increased A to come to stand from bed, but was MinA from toilet with grab bar use.  2nd person present, but not needed for transfers.   Ambulation/Gait Ambulation/Gait assistance: Min assist, +2 physical assistance Ambulation Distance (Feet): 20 Feet (x2) Assistive device: 2 person hand held assist Gait Pattern/deviations: Step-through pattern, Decreased stride length, Decreased dorsiflexion - left, Ataxic General Gait Details: pt unsteady and with ataxic type movements.  pt with decreased sensation ing Bil LEs also affecting safety with gait.      ADL:    Cognition: Cognition Overall Cognitive Status: Difficult to assess Orientation Level: Oriented X4 Cognition Arousal/Alertness: Awake/alert Behavior During Therapy: WFL for tasks assessed/performed Overall Cognitive Status: Difficult to assess Difficult to assess due to: Impaired communication  Blood pressure 178/97, pulse 70, temperature 98.7 F (37.1 C), temperature source Oral, resp. rate 13, height 6' (1.829 m), weight 109 kg (240 lb 4.8 oz), SpO2 97 %. Physical Exam  Constitutional: He appears well-developed.  HENT:  Facial droop  Eyes: EOM are normal.  Neck: Normal range of motion. Neck supple. No thyromegaly present.  Cardiovascular:  Cardiac rate controlled  Respiratory: Effort normal and breath sounds normal. No respiratory distress.  GI: Soft. Bowel sounds are normal. He exhibits no distension.  Neurological: He is alert.  Flat affect. He does good eye contact. Speech is very dysarthric but intelligible. He can provide his name and age. Very limited awareness of his deficits. He was able to  follow simple verbal commands  Skin: Skin is warm and dry.  3- Right Delt Bi Tri Grip, 4- HF, KE ADF 5/5 on left side Sensory reported as equal to LT in BUE and BLE  Results for orders placed or performed during the hospital encounter of 05/24/14 (from the past 24 hour(s))  MRSA PCR Screening     Status: None   Collection Time: 05/24/14  1:00 PM  Result Value Ref Range   MRSA by PCR NEGATIVE NEGATIVE  Lipid panel     Status: None   Collection Time: 05/25/14  5:26 AM  Result Value Ref Range   Cholesterol 158 0 - 200 mg/dL   Triglycerides 161 <096 mg/dL   HDL 51 >04 mg/dL   Total CHOL/HDL Ratio 3.1 RATIO   VLDL 25 0 - 40 mg/dL   LDL Cholesterol 82 0 - 99 mg/dL   Ct Head Wo Contrast  05/24/2014   CLINICAL DATA:  Code stroke. Right facial droop, right arm weakness, slurred speech.  EXAM: CT HEAD WITHOUT CONTRAST  TECHNIQUE: Contiguous axial images were obtained from the base of the skull through the vertex without intravenous contrast.  COMPARISON:  02/02/2012  FINDINGS: No intracranial hemorrhage, mass effect, or midline shift. No hydrocephalus. The basilar cisterns are patent. Small subcentimeter focus of hypodensity in the left occipital lobe, may  reflect new site of ischemia versus volume averaging. Encephalomalacia in the left frontal lobe, unchanged from prior exam. Moderate chronic small vessel ischemic change. Unchanged lacunar infarct in the right caudate. No intracranial fluid collection. Calvarium is intact. Chronic opacification of scattered ethmoid air cells and mucosal thickening left frontal sinus. Scattered opacification of lower mastoid air cells, improved on the right.  IMPRESSION: 1. Small subcentimeter focus of hypodensity in the left occipital lobe, may reflect new site of ischemia versus volume averaging. 2. Encephalomalacia in the left frontal lobe. Chronic small vessel ischemic change and remote lacunar infarct in the right caudate.   Electronically Signed   By: Rubye Oaks M.D.   On: 05/24/2014 05:31   Mr Brain Wo Contrast  05/24/2014   CLINICAL DATA:  Initial evaluation for acute onset right facial droop with slurred speech.  EXAM: MRI HEAD WITHOUT CONTRAST  MRA HEAD WITHOUT CONTRAST  TECHNIQUE: Multiplanar, multiecho pulse sequences of the brain and surrounding structures were obtained without intravenous contrast. Angiographic images of the head were obtained using MRA technique without contrast.  COMPARISON:  None.  FINDINGS: MRI HEAD FINDINGS  Diffuse prominence of the CSF containing spaces is compatible with generalized cerebral atrophy. Patchy and confluent T2/FLAIR hyperintensity within the periventricular and deep white matter both cerebral hemispheres most consistent with chronic small vessel ischemic changes.  Remote lacunar infarcts involving the periventricular white matter of the right corona radiata as well as the right pons present.  There is an abnormal focus of restricted diffusion involving the posterior limb of the left internal capsule, measuring approximately in 0.9 x 1.0 x 1.9 cm, compatible with an acute ischemic infarct. No associated hemorrhage or mass effect. Delete that  There are additional smaller infarcts involving the deep white matter of the right frontoparietal region (series 7, image 12). Additional tiny cortical infarct within the high left parietal lobe (series 7, image 11).  No mass lesion or midline shift. Ventricular prominence related to global parenchymal volume loss present without hydrocephalus. No extra-axial fluid collection.  Craniocervical junction within normal limits. Prominent degenerative changes noted within the visualized upper cervical spine. Pituitary gland normal.  No acute abnormality seen about the orbits.  Minimal scattered opacity noted within the anterior ethmoidal air cells bilaterally. Paranasal sinuses are otherwise clear. Scattered fluid signal intensity present within the mastoid air cells bilaterally, left  greater than right.  Lumbar signal intensity is normal. Scalp soft tissues within normal limits.  MRA HEAD FINDINGS  ANTERIOR CIRCULATION:  Study is degraded by motion artifact.  Visualized distal cervical segments of the internal carotid arteries are patent bilaterally with antegrade flow. The right internal carotid artery is diminutive as compared to the left. The petrous segments are patent without high-grade stenosis. There is question of focal high-grade stenosis within knee anterior genu of the cavernous right ICA versus motion artifact (series 5, image 69). Otherwise, the cavernous and supra clinoid segments are patent without definite high-grade stenosis. Right A1 segment is hypoplastic or absent, likely accounting for the size difference in the internal carotid arteries. The left A1 segment is widely patent. Anterior communicating artery and anterior cerebral arteries well opacified.  M1 segments widely patent bilaterally without focal stenosis or occlusion. MCA bifurcations within normal limits. Probable distal branch atherosclerotic irregularity present within the MCA branches bilaterally.  POSTERIOR CIRCULATION:  Left vertebral artery is dominant. Vertebral arteries are widely patent to the vertebrobasilar junction. Posterior inferior cerebral arteries grossly patent proximally. Basilar artery is mildly tortuous but widely  patent without focal stenosis or occlusion. Superior cerebellar arteries are patent proximally. P1 and P2 segments are patent bilaterally. There is distal branch atheromatous irregularity within the posterior cerebral arteries bilaterally.  No aneurysm or vascular malformation.  IMPRESSION: MRI HEAD IMPRESSION:  1. Acute ischemic infarct involving the posterior limb of the left internal capsule as above. No associated hemorrhage or mass effect. 2. Additional smaller ischemic infarcts involving the deep white matter of the right frontal parietal region, with additional tiny cortical  infarct within the high left parietal lobe. Given the various vascular distributions, central thromboembolic etiology could be considered. 3. Remote lacunar infarcts involving the right basal ganglia/ corona radiata and right pons. 4. Generalized cerebral atrophy with moderate chronic microvascular ischemic disease.  MRA HEAD IMPRESSION:  1. Motion degraded study.  No proximal branch occlusion identified. 2. Question focal high-grade stenosis within the anterior genu of the cavernous right ICA. This finding may in part be related to extensive motion artifact through this region on this exam. 3. Hypoplastic/absent right A1 segment, with the anterior cerebral artery supplied via the left internal carotid artery. As a result com on the right ICA is asymmetrically small as compared to the left. 4. Distal branch atherosclerotic irregularity within the MCA and PCA branches bilaterally.   Electronically Signed   By: Rise Mu M.D.   On: 05/24/2014 22:53   Dg Chest Port 1 View  05/24/2014   CLINICAL DATA:  Cough  EXAM: PORTABLE CHEST - 1 VIEW  COMPARISON:  02/02/2012  FINDINGS: Cardiac shadow is enlarged. The lungs are well aerated bilaterally. No focal infiltrate or sizable effusion is seen. No bony abnormality is noted.  IMPRESSION: No active disease.   Electronically Signed   By: Alcide Clever M.D.   On: 05/24/2014 13:54   Mr [name] Head/brain Wo Cm  05/24/2014   CLINICAL DATA:  Initial evaluation for acute onset right facial droop with slurred speech.  EXAM: MRI HEAD WITHOUT CONTRAST  MRA HEAD WITHOUT CONTRAST  TECHNIQUE: Multiplanar, multiecho pulse sequences of the brain and surrounding structures were obtained without intravenous contrast. Angiographic images of the head were obtained using MRA technique without contrast.  COMPARISON:  None.  FINDINGS: MRI HEAD FINDINGS  Diffuse prominence of the CSF containing spaces is compatible with generalized cerebral atrophy. Patchy and confluent T2/FLAIR  hyperintensity within the periventricular and deep white matter both cerebral hemispheres most consistent with chronic small vessel ischemic changes.  Remote lacunar infarcts involving the periventricular white matter of the right corona radiata as well as the right pons present.  There is an abnormal focus of restricted diffusion involving the posterior limb of the left internal capsule, measuring approximately in 0.9 x 1.0 x 1.9 cm, compatible with an acute ischemic infarct. No associated hemorrhage or mass effect. Delete that  There are additional smaller infarcts involving the deep white matter of the right frontoparietal region (series 7, image 12). Additional tiny cortical infarct within the high left parietal lobe (series 7, image 11).  No mass lesion or midline shift. Ventricular prominence related to global parenchymal volume loss present without hydrocephalus. No extra-axial fluid collection.  Craniocervical junction within normal limits. Prominent degenerative changes noted within the visualized upper cervical spine. Pituitary gland normal.  No acute abnormality seen about the orbits.  Minimal scattered opacity noted within the anterior ethmoidal air cells bilaterally. Paranasal sinuses are otherwise clear. Scattered fluid signal intensity present within the mastoid air cells bilaterally, left greater than right.  Lumbar signal intensity is  normal. Scalp soft tissues within normal limits.  MRA HEAD FINDINGS  ANTERIOR CIRCULATION:  Study is degraded by motion artifact.  Visualized distal cervical segments of the internal carotid arteries are patent bilaterally with antegrade flow. The right internal carotid artery is diminutive as compared to the left. The petrous segments are patent without high-grade stenosis. There is question of focal high-grade stenosis within knee anterior genu of the cavernous right ICA versus motion artifact (series 5, image 69). Otherwise, the cavernous and supra clinoid segments  are patent without definite high-grade stenosis. Right A1 segment is hypoplastic or absent, likely accounting for the size difference in the internal carotid arteries. The left A1 segment is widely patent. Anterior communicating artery and anterior cerebral arteries well opacified.  M1 segments widely patent bilaterally without focal stenosis or occlusion. MCA bifurcations within normal limits. Probable distal branch atherosclerotic irregularity present within the MCA branches bilaterally.  POSTERIOR CIRCULATION:  Left vertebral artery is dominant. Vertebral arteries are widely patent to the vertebrobasilar junction. Posterior inferior cerebral arteries grossly patent proximally. Basilar artery is mildly tortuous but widely patent without focal stenosis or occlusion. Superior cerebellar arteries are patent proximally. P1 and P2 segments are patent bilaterally. There is distal branch atheromatous irregularity within the posterior cerebral arteries bilaterally.  No aneurysm or vascular malformation.  IMPRESSION: MRI HEAD IMPRESSION:  1. Acute ischemic infarct involving the posterior limb of the left internal capsule as above. No associated hemorrhage or mass effect. 2. Additional smaller ischemic infarcts involving the deep white matter of the right frontal parietal region, with additional tiny cortical infarct within the high left parietal lobe. Given the various vascular distributions, central thromboembolic etiology could be considered. 3. Remote lacunar infarcts involving the right basal ganglia/ corona radiata and right pons. 4. Generalized cerebral atrophy with moderate chronic microvascular ischemic disease.  MRA HEAD IMPRESSION:  1. Motion degraded study.  No proximal branch occlusion identified. 2. Question focal high-grade stenosis within the anterior genu of the cavernous right ICA. This finding may in part be related to extensive motion artifact through this region on this exam. 3. Hypoplastic/absent right  A1 segment, with the anterior cerebral artery supplied via the left internal carotid artery. As a result com on the right ICA is asymmetrically small as compared to the left. 4. Distal branch atherosclerotic irregularity within the MCA and PCA branches bilaterally.   Electronically Signed   By: Rise Mu M.D.   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 Does the need for close, 24 hr/day medical supervision in concert with the patient's rehab needs make it unreasonable for this patient to be served in a less intensive setting? Yes 2. Co-Morbidities requiring supervision/potential complications: HTN, CHF,Afib/flutter 3. Due to bladder management, bowel management, safety, skin/wound care, disease management, medication administration, pain management and patient education, does the patient require 24 hr/day rehab nursing? Yes 4. Does the patient require coordinated care of a physician, rehab nurse, PT (1-2 hrs/day, 5 days/week), OT (1-2 hrs/day, 5 days/week) and SLP (.5-1 hrs/day, 5 days/week) to address physical and functional deficits in the context of the above medical diagnosis(es)? Yes Addressing deficits in the following areas: balance, endurance, locomotion, strength, transferring, bowel/bladder control, bathing, dressing, feeding, grooming, toileting, cognition, speech and swallowing 5. Can the patient actively participate in an intensive therapy program of at least 3 hrs of therapy per day at least 5 days per week? Yes 6. The potential for patient to  make measurable gains while on inpatient rehab is excellent 7. Anticipated functional outcomes upon discharge from inpatient rehab are modified independent and supervision  with PT, modified independent and supervision with OT, modified independent and supervision with SLP. 8. Estimated rehab length of stay to reach the above functional goals is: 10-14d 9. Does  the patient have adequate social supports and living environment to accommodate these discharge functional goals? Potentially 10. Anticipated D/C setting: Home 11. Anticipated post D/C treatments: HH therapy 12. Overall Rehab/Functional Prognosis: good  RECOMMENDATIONS: This patient's condition is appropriate for continued rehabilitative care in the following setting: CIR Patient has agreed to participate in recommended program. Yes Note that insurance prior authorization may be required for reimbursement for recommended care.  Comment: Needs to complete neuro and cardiology work up, [employer] day #1    05/25/2014

## 2014-05-25 NOTE — Progress Notes (Signed)
[place] TEAM 1 - Stepdown/ICU TEAM Progress Note  Brian Mclaughlin ZOX:[id number] DOB: [birth year] DOA: 05/24/2014 PCP: No PCP Per Patient  Admit HPI / Brief Narrative: 68 year old WM PMHx hypertension, previous stroke, alcohol abuse, atrial fibrillation, alcohol abuse, and hyperlipidemia, and presented with new onset right facial droop and slurred speech. Patient has not been compliant with his medications including his Xarelto and blood pressure medication.  In ED, CT head with no acute hemorrhage, with hypodensity focus in the left occipital lobe.  Patient was not given TPA due to delayed arrival. Neurology was consulted, and pt admitted with stroke workup  HPI/Subjective: A and O x4- denies any complaints. States not taking medications because he does not like to take meds.  Assessment/Plan:  Probable left brain embolic stroke, likely MCA territory -CT head- small hypodensity in left occipital lobe -MRI/MRA head- ? 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 Placed on aspirin 300 MG, with recommendation of NOAC once able to swallow -Carotid Doppler with bilateral 1-39% ICA stenosis -hemoglobin A1c 5.3, LDL 82 -Keep NPO, SLP pending -recommendation for CIR  Previous history of stroke -The patient not compliant on Xarelto  Atrial fibrillation -Heart rate stable -Per cardiology- no indication for rate lowering med now  Hypertension -allow permissive HTN in stetting of stroke   Hyperlipidemia -LDL 82 -We'll restart statin once able to take PO  Chronic Systolic heart failure Appears euvolemic -2D echo -EF 25-30%  Tobacco abuse Educated cessation  Alcohol abuse -Patient not taking anticoagulation secondary to frequent alcohol use  Obesity Body mass index is 32.58 kg/(m^2).   Code Status: DNR Family Communication: no family present at time of exam Disposition Plan: Transfer to tele  floor, HR controlled Consultants: Neurology Cardiology  Procedure/Significant Events: CT head:- Small subcentimeter focus of hypodensity Lt occipital lobe, may reflect new site of ischemia versus  -Encephalomalacia in the left frontal lobe. -Chronic small vessel ischemic change and remote lacunar infarct in the right caudate. 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?Marland Kitchen    Culture None  Antibiotics: None  DVT prophylaxis: SCDs   Devices None   LINES / TUBES:  NOne    Continuous Infusions:   Objective: VITAL SIGNS: Temp: 98.1 F (36.7 C) (03/10 1247) Temp Source: Oral (03/10 1247) BP: 172/98 mmHg (03/10 1400) Pulse Rate: 72 (03/10 1400) SPO2; FIO2:   Intake/Output Summary (Last 24 hours) at 05/25/14 1629 Last data filed at 05/25/14 1247  Gross per 24 hour  Intake      0 ml  Output   2175 ml  Net  -2175 ml     Exam: General: Obese Caucasian male in NAD, mild aphasia with difficulty with word finding. W/ right facial weekness Lungs: Clear to auscultation bilaterally without wheezes or crackles. Cardiovascular: Regular rate and rhythm without murmur gallop or rub normal S1 and S2 Abdomen: Nontender, obese abdomen,  soft, bowel sounds positive, no rebound, no ascites, no appreciable mass Extremities: Mild weak RUE grip and strength.  Neurologic; cranial nerve II through XII intact, tongue/uvula midline, right facial droop, right-sided weakness extremity strength 4/5, finger nose finger within normal limits on left, pass pointing on right, unable to perform quick finger touch right hand.   Data Reviewed: Basic Metabolic Panel:  Recent Labs Lab 05/24/14 0510 05/24/14 0518  NA 138 139  K 4.1 4.1  CL 105 104  CO2 27  --   GLUCOSE  103* 99  BUN 18 19  CREATININE 2.04* 2.00*  CALCIUM 8.9  --    Liver Function Tests:  Recent Labs Lab 05/24/14 0510  AST 20  ALT 12  ALKPHOS 62  BILITOT 0.6  PROT 6.9  ALBUMIN  3.3*   No results for input(s): LIPASE, AMYLASE in the last 168 hours. No results for input(s): AMMONIA in the last 168 hours. CBC:  Recent Labs Lab 05/24/14 0510 05/24/14 0518  WBC 9.0  --   NEUTROABS 6.2  --   HGB 14.3 16.3  HCT 41.9 48.0  MCV 99.5  --   PLT 128*  --    Cardiac Enzymes: No results for input(s): CKTOTAL, CKMB, CKMBINDEX, TROPONINI in the last 168 hours. BNP (last 3 results) No results for input(s): BNP in the last 8760 hours.  ProBNP (last 3 results) No results for input(s): PROBNP in the last 8760 hours.  CBG: No results for input(s): GLUCAP in the last 168 hours.  Recent Results (from the past 240 hour(s))  MRSA PCR Screening     Status: None   Collection Time: 05/24/14  1:00 PM  Result Value Ref Range Status   MRSA by PCR NEGATIVE NEGATIVE Final    Comment:        The GeneXpert MRSA Assay (FDA approved for NASAL specimens only), is one component of a comprehensive MRSA colonization surveillance program. It is not intended to diagnose MRSA infection nor to guide or monitor treatment for MRSA infections.      Studies:  Recent x-ray studies have been reviewed in detail by the Attending Physician  Scheduled Meds:  Scheduled Meds: .  stroke: mapping our early stages of recovery book   Does not apply Once  . aspirin  300 mg Rectal Daily  . heparin  5,000 Units Subcutaneous 3 times per day  . nicotine  21 mg Transdermal Daily    Time spent on care of this patient: 40 mins   Illa LevelOsman, Sahar , [facility]  [facility] Office  [phone] Pager - [phone]  On-Call/Text Page:      [email or web address]      password [email or web address]  If 7PM-7AM, please contact night-coverage [email or web address] Password [email or web address] 05/25/2014, 4:29 PM   LOS: 1 day   Examined Patient and discussed A&P with PA Sahar and agree with above plan.  I have reviewed the entire database. I have made any necessary editorial changes, and agree with its content. I have reviewed this patient's  available data, including medical history, events of note, physical examination, radiology studies and test results as part of my evaluation  Pt with Multiple Complex medical problems> 35 min spent in direct Pt care   Carolyne Littlesurtis Woods, MD  [facility]  [phone] pager

## 2014-05-25 NOTE — Progress Notes (Signed)
*  PRELIMINARY RESULTS* Vascular Ultrasound Carotid Duplex (Doppler) has been completed.  Preliminary findings: Bilateral:  1-39% ICA stenosis.  Vertebral artery flow is antegrade.      Farrel DemarkJill Eunice, RDMS, RVT  05/25/2014, 11:14 AM

## 2014-05-25 NOTE — Plan of Care (Signed)
Problem: Consults Goal: Ischemic Stroke Patient Education See Patient Education Module for education specifics.  Outcome: Progressing Reviewed stroke education with patient. Pt states no further questions. Will continue to educate and reinforce teaching as needed.

## 2014-05-25 NOTE — Evaluation (Signed)
Physical Therapy Evaluation Patient Details Name: Brian Mclaughlin MRN: [id number] DOB: March 16, 1946 Today's Date: 05/25/2014   History of Present Illness  pt presents with Ischemic L MCA CVA.  pt with hx of CVA resulting in L foot drop.    Clinical Impression  Pt ataxic and unsteady with mobility.  Pt seems to have poor awareness of deficits, but difficult to assess cognition with expressive deficits.  Pt does answer all yes/no questions appropriately.  Discussed that pt will need further rehab prior to returning to home and pt able to state "I hate rehab.".  Unclear if pt would have any support to D/C to home as he lives alone and feel he is not safe to return to home at this time.  Will continue to follow.      Follow Up Recommendations CIR    Equipment Recommendations  None recommended by PT    Recommendations for Other Services Rehab consult     Precautions / Restrictions Precautions Precautions: Fall Restrictions Weight Bearing Restrictions: No      Mobility  Bed Mobility Overal bed mobility: Needs Assistance Bed Mobility: Supine to Sit;Sit to Supine     Supine to sit: Mod assist;HOB elevated Sit to supine: Min assist   General bed mobility comments: pt needs A to bring trunk up to sitting and to scoot hips to EOB.    Transfers Overall transfer level: Needs assistance Equipment used: 1 person hand held assist;2 person hand held assist Transfers: Sit to/from Stand Sit to Stand: Min assist;Mod assist         General transfer comment: pt needed increased A to come to stand from bed, but was MinA from toilet with grab bar use.  2nd person present, but not needed for transfers.    Ambulation/Gait Ambulation/Gait assistance: Min assist;+2 physical assistance Ambulation Distance (Feet): 20 Feet (x2) Assistive device: 2 person hand held assist Gait Pattern/deviations: Step-through pattern;Decreased stride length;Decreased dorsiflexion - left;Ataxic     General Gait  Details: pt unsteady and with ataxic type movements.  pt with decreased sensation ing Bil LEs also affecting safety with gait.    Stairs            Wheelchair Mobility    Modified Rankin (Stroke Patients Only) Modified Rankin (Stroke Patients Only) Pre-Morbid Rankin Score: No significant disability Modified Rankin: Moderately severe disability     Balance Overall balance assessment: Needs assistance Sitting-balance support: No upper extremity supported;Feet supported Sitting balance-Leahy Scale: Fair     Standing balance support: During functional activity Standing balance-Leahy Scale: Poor                               Pertinent Vitals/Pain Pain Assessment: No/denies pain    Home Living Family/patient expects to be discharged to:: Inpatient rehab                      Prior Function Level of Independence: Independent               Hand Dominance        Extremity/Trunk Assessment   Upper Extremity Assessment: Defer to OT evaluation           Lower Extremity Assessment: RLE deficits/detail;LLE deficits/detail RLE Deficits / Details: Decreased coordination and ataxic movements.  Strength grossly 4/5 and sensation diminished.   LLE Deficits / Details: pt with hx L foot drop from previous CVA.  Trace ankle plantarflexion.  Diminished sensation.    Cervical / Trunk Assessment: Normal  Communication   Communication: Expressive difficulties  Cognition Arousal/Alertness: Awake/alert Behavior During Therapy: WFL for tasks assessed/performed Overall Cognitive Status: Difficult to assess                      General Comments      Exercises        Assessment/Plan    PT Assessment Patient needs continued PT services  PT Diagnosis Difficulty walking   PT Problem List Decreased strength;Decreased activity tolerance;Decreased balance;Decreased mobility;Decreased coordination;Decreased knowledge of use of DME;Impaired  sensation  PT Treatment Interventions DME instruction;Gait training;Stair training;Functional mobility training;Therapeutic activities;Therapeutic exercise;Balance training;Neuromuscular re-education;Patient/family education   PT Goals (Current goals can be found in the Care Plan section) Acute Rehab PT Goals Patient Stated Goal: home asap. PT Goal Formulation: With patient Time For Goal Achievement: 06/08/14 Potential to Achieve Goals: Good    Frequency Min 4X/week   Barriers to discharge Decreased caregiver support      Co-evaluation               End of Session Equipment Utilized During Treatment: Gait belt Activity Tolerance: Patient tolerated treatment well Patient left: in bed;with call bell/phone within reach Nurse Communication: Mobility status         Time: 0940-1005 PT Time Calculation (min) (ACUTE ONLY): 25 min   Charges:   PT Evaluation $Initial PT Evaluation Tier I: 1 Procedure PT Treatments $Gait Training: 8-22 mins   PT G CodesSunny Schlein:        Apryl Brymer F, [place] CarolinaPT [phone] 05/25/2014, 10:40 AM

## 2014-05-25 NOTE — Care Management Note (Signed)
    Page 1 of 1   05/30/2014     5:21:52 PM CARE MANAGEMENT NOTE 05/30/2014  Patient:  Brian Mclaughlin,Brian Mclaughlin   Account Number:  [id number]  Date Initiated:  05/25/2014  Documentation initiated by:  Gae GallopOLE,ANGELA  Subjective/Objective Assessment:   PTA from home alone admitted with CVA.     Action/Plan:   Return to home when medically stable. CM to f/u  with d/Mclaughlin needs.   Anticipated DC Date:  05/27/2014   Anticipated DC Plan:  SKILLED NURSING FACILITY  In-house referral  Clinical Social Worker      DC Planning Services  CM consult      Choice offered to / List presented to:             Status of service:  Completed, signed off Medicare Important Message given?  YES (If response is "NO", the following Medicare IM given date fields will be blank) Date Medicare IM given:  05/29/2014 Medicare IM given by:  Letha CapeAYLOR,Treyton Slimp Date Additional Medicare IM given:   Additional Medicare IM given by:    Discharge Disposition:  SKILLED NURSING FACILITY  Per UR Regulation:  Reviewed for med. necessity/level of care/duration of stay  If discussed at Long Length of Stay Meetings, dates discussed:    Comments:  05/30/14 1721 Barnie Morteborah Tayllor RN, BSN [phone] dc to snf.

## 2014-05-25 NOTE — Evaluation (Addendum)
Clinical/Bedside Swallow Evaluation Patient Details  Name: Brian Mclaughlin MRN: [id number] Date of Birth: 01-Oct-1945  Today's Date: 05/25/2014 Time: SLP Start Time (ACUTE ONLY): 1500 SLP Stop Time (ACUTE ONLY): 1520 SLP Time Calculation (min) (ACUTE ONLY): 20 min  Past Medical History:  Past Medical History  Diagnosis Date  . Hypertension   . Tobacco abuse   . ETOH abuse   . Hyperlipidemia   . Chronic systolic CHF (congestive heart failure)   . Alcohol abuse   . Tobacco use disorder   . Atrial fibrillation   . Acute kidney failure, unspecified   . Rhabdomyolysis   . Altered mental status   . Nonspecific elevation of levels of transaminase or lactic acid dehydrogenase (LDH)   . Acute, but ill-defined, cerebrovascular disease   . CVA (cerebral infarction)    Past Surgical History:  Past Surgical History  Procedure Laterality Date  . Eye muscle surgery      as a teenager "tighten his muscles"  . Back surgery  10 years ago    "slipped disc" repair   HPI:  Brian Mclaughlin is a 69 y.o. right handed male with a history of hypertension, previous stroke, alcohol and tobacco abuse, CHF, atrial fibrillation and hyperlipidemia, presenting with new onset right facial droop and slurred speech on 05-24-14. Brian Mclaughlin with history of medicine noncompliance. CT scan of his head showed no acute intracranial hemorrhage. A subcentimeter focus of hypodensity was noted in the left occipital lobe, possibly a small acute stroke. MRI of the brain shows infarct of left internal capsule,  right frontal parietal region involvement, and  infarct of right basal ganglia.  There was encephalomalacia in the left frontal lobe from previous stroke. NIH stroke score was 4. TPA not administered secondary to delay in arrival. Brian Mclaughlin was independent prior to admission and living alone. 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 Cough/throat clearing significantly delayed raising further concern for silent aspiration. Aspiration risk high at this time with all pos. Prognosis for improved swallow function good with neurological improvements and use of compensatory strategies. Plan for MBS 3/11 am to instrumentally assess function and determine ability to advance to a po diet with use of compensatory strategies.     Aspiration Risk  Severe    Diet Recommendation NPO   Medication Administration: Via alternative means    Other  Recommendations Recommended Consults: MBS Oral Care Recommendations:  (QID)   Follow Up Recommendations  Inpatient Rehab            Swallow Study Prior Functional Status       General Date of Onset: 05/24/14 HPI: Brian Mclaughlin is a 69 y.o. right handed male with a history of hypertension, previous stroke, alcohol and tobacco abuse, CHF, atrial fibrillation and hyperlipidemia, presenting with new onset right facial droop and slurred speech on 05-24-14. Brian Mclaughlin with history of medicine noncompliance. CT scan of his head showed no acute intracranial hemorrhage. A subcentimeter focus of hypodensity was noted in the left occipital lobe, possibly a small acute stroke. MRI of the brain shows infarct of left internal capsule,  right frontal parietal region involvement, and  infarct of right basal ganglia.  There was encephalomalacia in the left frontal lobe from previous stroke. NIH stroke  score was 4. TPA not administered secondary to delay in arrival. Brian Mclaughlin was independent prior to admission and living alone. Brian Mclaughlin currently noted to have severe dysarthria affecting expreesive communication intelligibility Type of Study: Bedside swallow evaluation Diet Prior to this Study:  NPO Temperature Spikes Noted: No Respiratory Status: Room air History of Recent Intubation: No Behavior/Cognition: Alert;Cooperative;Pleasant mood Oral Cavity - Dentition: Adequate natural dentition Self-Feeding Abilities: Able to feed self;Needs assist Patient Positioning: Upright in chair Baseline Vocal Quality: Clear;Low vocal intensity Volitional Cough: Weak Volitional Swallow: Able to elicit    Oral/Motor/Sensory Function Overall Oral Motor/Sensory Function: Impaired Labial ROM: Reduced right Labial Symmetry: Abnormal symmetry right Labial Strength: Reduced Labial Sensation: Reduced Lingual ROM: Reduced right;Reduced left Lingual Symmetry: Abnormal symmetry right Lingual Strength: Reduced Facial ROM: Reduced right Facial Symmetry: Right droop;Right drooping eyelid Facial Strength: Reduced Facial Sensation: Within Functional Limits Velum: Within Functional Limits Mandible: Within Functional Limits   Ice Chips Ice chips: Impaired Presentation: Spoon Oral Phase Impairments: Impaired anterior to posterior transit Oral Phase Functional Implications: Prolonged oral transit Pharyngeal Phase Impairments: Suspected delayed Swallow;Throat Clearing - Delayed   Thin Liquid Thin Liquid: Impaired Presentation: Cup;Self Fed Oral Phase Impairments: Impaired anterior to posterior transit (suspect decreased oral control/bolus cohesion) Pharyngeal  Phase Impairments: Suspected delayed Swallow;Cough - Delayed (indication of need to cough immediately,actual cough delayed)    Nectar Thick Nectar Thick Liquid: Not tested   Honey Thick Honey Thick Liquid: Impaired Presentation: Spoon Oral Phase Impairments: Impaired anterior to posterior transit Oral Phase Functional Implications: Prolonged oral transit Pharyngeal Phase Impairments: Throat Clearing - Delayed;Suspected delayed Swallow   Puree Puree: Impaired Presentation: Spoon Pharyngeal Phase Impairments: Throat Clearing - Delayed;Cough -  Delayed   Solid   GO   Marcene Duos MA, CCC-SLP Acute Care Speech Language Pathologist   [place] MA, CCC-SLP [phone]  Solid: Not tested       Kennieth Rad 05/25/2014,3:39 PM

## 2014-05-25 NOTE — Evaluation (Signed)
Occupational Therapy Evaluation Patient Details Name: TAYGEN ACKLIN MRN: [id number] DOB: 06/06/1945 Today's Date: 05/25/2014    History of Present Illness Pt is a 69 y.o. Male admitted 05/24/14 with acute onset of slurred speech and right facial droop. MRI showed acute ischemic infarct posterior limb of the left interanl capsule with additional smaller ischemic infarcts  in right front perietal region and remote lacunar infarct right basal ganglia. Pt did not receive TPA. PMH: HTN, tobacco and alcohol abuse, CHF, chronic renal insufficiency, cardiomyopathy, a-fib on Xarelto with medical noncompliance, and CVA with residual Left foot drop.    Clinical Impression   PTA pt lived at home alone and was independent with ADLs. Pt is limited by RUE weakness, ataxia, and decreased mobility and requires mod A for LB ADLs and functional mobility at minimum. Pt will benefit from acute OT to address RUE strengthening, ADLs, and functional transfers. Pt would be an excellent CIR candidate to progress to Mod I level to facilitate return home with supervision.     Follow Up Recommendations  CIR;Supervision/Assistance - 24 hour    Equipment Recommendations  Other (comment) (TBD)    Recommendations for Other Services       Precautions / Restrictions Precautions Precautions: Fall Restrictions Weight Bearing Restrictions: No      Mobility Bed Mobility Overal bed mobility: Needs Assistance Bed Mobility: Sit to Supine       Sit to supine: Min assist   General bed mobility comments: A to bring LEs onto bed. HOB flat and no use of bed rail.   Transfers Overall transfer level: Needs assistance Equipment used: 1 person hand held assist Transfers: Stand Pivot Transfers;Sit to/from Stand Sit to Stand: Min assist Stand pivot transfers: Mod assist       General transfer comment: Min A to stand from recliner but required Mod A to SPT to bed due to ataxia and RLE weakness.     Balance Overall  balance assessment: Needs assistance Sitting-balance support: No upper extremity supported;Feet supported Sitting balance-Leahy Scale: Fair     Standing balance support: Single extremity supported;During functional activity Standing balance-Leahy Scale: Poor                              ADL Overall ADL's : Needs assistance/impaired Eating/Feeding: NPO Eating/Feeding Details (indicate cue type and reason): MBS planned potentially for 3/11.  Grooming: Set up;Sitting   Upper Body Bathing: Minimal assitance;Sitting   Lower Body Bathing: Maximal assistance;Sit to/from stand   Upper Body Dressing : Minimal assistance;Sitting   Lower Body Dressing: Total assistance;Sit to/from stand   Toilet Transfer: Moderate assistance;Stand-pivot (1 person hand held assist) Toilet Transfer Details (indicate cue type and reason): pt has catheter           General ADL Comments: Pt displayed humor stating "this will hinder my golf game." Pt was conversant with OT and able to use full sentences to communicate, though at times having difficulty with word choice and getting words out. Pt appears to have [facility] cognition, but will continue to assess as communication improves.      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 Denies dizziness, diplopia, blurry vision. No nystagmus noted.           Pertinent Vitals/Pain Pain Assessment: No/denies pain     Hand Dominance Right   Extremity/Trunk Assessment Upper Extremity Assessment Upper Extremity Assessment: RUE deficits/detail RUE  Deficits / Details: Full AROM of RUE but slow movements and weakness (about 4-/5 throughout). Able to oppose thumb to 5th digit, but slowly.  RUE Coordination: decreased fine motor;decreased gross motor   Lower Extremity Assessment Lower Extremity Assessment: Defer to PT evaluation   Cervical / Trunk Assessment Cervical / Trunk Assessment: Normal    Communication Communication Communication: Expressive difficulties   Cognition Arousal/Alertness: Awake/alert Behavior During Therapy: WFL for tasks assessed/performed                                  Home Living Family/patient expects to be discharged to:: Inpatient rehab                                        Prior Functioning/Environment Level of Independence: Independent             OT Diagnosis: Hemiplegia dominant side;Ataxia;Cognitive deficits   OT Problem List: Decreased strength;Decreased range of motion;Decreased activity tolerance;Impaired balance (sitting and/or standing);Decreased coordination;Decreased cognition;Decreased safety awareness;Impaired UE functional use   OT Treatment/Interventions: Self-care/ADL training;Therapeutic exercise;Energy conservation;Neuromuscular education;DME and/or AE instruction;Therapeutic activities;Cognitive remediation/compensation;Patient/family education;Balance training    OT Goals(Current goals can be found in the care plan section) Acute Rehab OT Goals Patient Stated Goal: to get out of here OT Goal Formulation: With patient Time For Goal Achievement: 06/08/14 Potential to Achieve Goals: Good ADL Goals Pt Will Perform Grooming: with min assist;standing Pt Will Perform Upper Body Bathing: with set-up;sitting Pt Will Perform Lower Body Bathing: with min assist;sit to/from stand Pt Will Perform Upper Body Dressing: with set-up;sitting Pt Will Perform Lower Body Dressing: with min assist;sit to/from stand Pt Will Transfer to Toilet: with supervision;stand pivot transfer;bedside commode Pt Will Perform Toileting - Clothing Manipulation and hygiene: with supervision;sit to/from stand Pt/caregiver will Perform Home Exercise Program: Increased strength;Right Upper extremity;Independently  OT Frequency: Min 3X/week   Barriers to D/C: Decreased caregiver support             End of Session  Equipment Utilized During Treatment: Gait belt Nurse Communication: Other (comment) (pt back in bed)  Activity Tolerance: Patient tolerated treatment well Patient left: in bed;with call bell/phone within reach;with bed alarm set   Time: [id number] OT Time Calculation (min): 33 min Charges:  OT General Charges $OT Visit: 1 Procedure OT Evaluation $Initial OT Evaluation Tier I: 1 Procedure OT Treatments $Self Care/Home Management : 8-22 mins G-Codes:    Rae LipsMiller, Blair Mesina M 05/25/2014, 4:39 PM  Yehuda MaoLeeAnn Guido SanderMarie Shaguana Love, OTR/L Occupational Therapist [phone] (pager)

## 2014-05-25 NOTE — Progress Notes (Signed)
Rehab Admissions Coordinator Note:  Patient was screened by Jahlani Lorentz L for appropriateness for an Inpatient Acute Rehab Consult.  At this time, we are recommending Inpatient Rehab consult.  Joven Mom L 05/25/2014, 10:56 AM  I can be reached at [phone].

## 2014-05-26 ENCOUNTER — Inpatient Hospital Stay (HOSPITAL_COMMUNITY): Payer: Medicare Other

## 2014-05-26 DIAGNOSIS — N179 Acute kidney failure, unspecified: Secondary | ICD-10-CM

## 2014-05-26 DIAGNOSIS — Z91148 Patient's other noncompliance with medication regimen for other reason: Secondary | ICD-10-CM | POA: Diagnosis present

## 2014-05-26 DIAGNOSIS — I482 Chronic atrial fibrillation, unspecified: Secondary | ICD-10-CM | POA: Diagnosis present

## 2014-05-26 DIAGNOSIS — I639 Cerebral infarction, unspecified: Secondary | ICD-10-CM | POA: Diagnosis present

## 2014-05-26 DIAGNOSIS — Z9114 Patient's other noncompliance with medication regimen: Secondary | ICD-10-CM | POA: Diagnosis present

## 2014-05-26 LAB — BASIC METABOLIC PANEL
Anion gap: 10 (ref 5–15)
BUN: 14 mg/dL (ref 6–23)
CHLORIDE: 102 mmol/L (ref 96–112)
CO2: 24 mmol/L (ref 19–32)
Calcium: 9.1 mg/dL (ref 8.4–10.5)
Creatinine, Ser: 1.79 mg/dL — ABNORMAL HIGH (ref 0.50–1.35)
GFR calc non Af Amer: 37 mL/min — ABNORMAL LOW (ref >90)
GFR, EST AFRICAN AMERICAN: 43 mL/min — AB (ref >90)
Glucose, Bld: 86 mg/dL (ref 70–99)
Potassium: 4 mmol/L (ref 3.5–5.1)
Sodium: 136 mmol/L (ref 135–145)

## 2014-05-26 LAB — CBC
HCT: 44 % (ref 39.0–52.0)
Hemoglobin: 14.9 g/dL (ref 13.0–17.0)
MCH: 33.3 pg (ref 26.0–34.0)
MCHC: 33.9 g/dL (ref 30.0–36.0)
MCV: 98.4 fL (ref 78.0–100.0)
Platelets: 129 10*3/uL — ABNORMAL LOW (ref 150–400)
RBC: 4.47 MIL/uL (ref 4.22–5.81)
RDW: 12.6 % (ref 11.5–15.5)
WBC: 7.4 10*3/uL (ref 4.0–10.5)

## 2014-05-26 LAB — HEMOGLOBIN A1C
Hgb A1c MFr Bld: 5.6 % (ref 4.8–5.6)
Mean Plasma Glucose: 114 mg/dL

## 2014-05-26 MED ORDER — SCOPOLAMINE 1 MG/3DAYS TD PT72
1.0000 | MEDICATED_PATCH | TRANSDERMAL | Status: DC
  Administered 2014-05-26: 1.5 mg via TRANSDERMAL
  Filled 2014-05-26 (×2): qty 1

## 2014-05-26 MED ORDER — CARVEDILOL 3.125 MG PO TABS
3.1250 mg | ORAL_TABLET | Freq: Two times a day (BID) | ORAL | Status: DC
  Administered 2014-05-26 – 2014-05-29 (×6): 3.125 mg via ORAL
  Filled 2014-05-26 (×9): qty 1

## 2014-05-26 MED ORDER — ASPIRIN EC 325 MG PO TBEC: 325.0000 mg | DELAYED_RELEASE_TABLET | Freq: Four times a day (QID) | ORAL | Status: DC | PRN

## 2014-05-26 MED ORDER — SODIUM CHLORIDE 0.9 % IV SOLN
INTRAVENOUS | Status: DC
  Administered 2014-05-27 – 2014-05-28 (×2): via INTRAVENOUS

## 2014-05-26 MED ORDER — RESOURCE THICKENUP CLEAR PO POWD
ORAL | Status: DC | PRN
  Filled 2014-05-26: qty 125

## 2014-05-26 MED ORDER — ASPIRIN 81 MG PO CHEW
324.0000 mg | CHEWABLE_TABLET | Freq: Every day | ORAL | Status: DC
  Administered 2014-05-26 – 2014-05-28 (×3): 324 mg via ORAL
  Filled 2014-05-26 (×3): qty 4

## 2014-05-26 NOTE — Progress Notes (Signed)
Physical Therapy Treatment Patient Details Name: Brian Mclaughlin MRN: [id number] DOB: [birth year] Today's Date: 05/26/2014    History of Present Illness pt presents with Ischemic L MCA CVA.  pt with hx of CVA resulting in L foot drop.      PT Comments    Pt eager for mobility, but with poor awareness of fatigue level.  Noted CIR recommending SNF as pt's insurance will not pay for CIR then SNF placement, so will update D/C plan for SNF.  Will continue to follow.    Follow Up Recommendations  SNF     Equipment Recommendations  None recommended by PT    Recommendations for Other Services       Precautions / Restrictions Precautions Precautions: Fall Restrictions Weight Bearing Restrictions: No    Mobility  Bed Mobility Overal bed mobility: Needs Assistance Bed Mobility: Supine to Sit     Supine to sit: Mod assist;HOB elevated     General bed mobility comments: A with bring trunk up to sitting and bringing hips to EOB.    Transfers Overall transfer level: Needs assistance Equipment used: 2 person hand held assist Transfers: Sit to/from Stand Sit to Stand: Min assist         General transfer comment: cues for UE use and to slow down to A with balance.    Ambulation/Gait Ambulation/Gait assistance: Min assist;+2 physical assistance Ambulation Distance (Feet): 50 Feet Assistive device: 2 person hand held assist Gait Pattern/deviations: Step-through pattern;Decreased stride length;Decreased dorsiflexion - left;Ataxic     General Gait Details: pt ataxic and unsteady during ambulation.  pt with poor awareness of fatigue level until visibly fatiged and needing a chair.     Stairs            Wheelchair Mobility    Modified Rankin (Stroke Patients Only) Modified Rankin (Stroke Patients Only) Pre-Morbid Rankin Score: No significant disability Modified Rankin: Moderately severe disability     Balance Overall balance assessment: Needs  assistance Sitting-balance support: No upper extremity supported;Feet supported Sitting balance-Leahy Scale: Fair     Standing balance support: During functional activity Standing balance-Leahy Scale: Poor                      Cognition Arousal/Alertness: Awake/alert Behavior During Therapy: WFL for tasks assessed/performed Overall Cognitive Status: Difficult to assess                      Exercises      General Comments        Pertinent Vitals/Pain Pain Assessment: No/denies pain    Home Living Family/patient expects to be discharged to:: Skilled nursing facility                    Prior Function            PT Goals (current goals can now be found in the care plan section) Acute Rehab PT Goals Patient Stated Goal: to get out of here PT Goal Formulation: With patient Time For Goal Achievement: 06/08/14 Potential to Achieve Goals: Good Progress towards PT goals: Progressing toward goals    Frequency  Min 3X/week    PT Plan Frequency needs to be updated;Discharge plan needs to be updated    Co-evaluation             End of Session Equipment Utilized During Treatment: Gait belt Activity Tolerance: Patient tolerated treatment well Patient left: in chair;with nursing/sitter in room (Nsg tech in  to bathe)     Time: 1610-9604 PT Time Calculation (min) (ACUTE ONLY): 19 min  Charges:  $Gait Training: 8-22 mins                    G CodesSunny Schlein, [place] [phone] 05/26/2014, 3:48 PM

## 2014-05-26 NOTE — Progress Notes (Signed)
TEAM 1 - Stepdown/ICU TEAM Progress Note  Brian Mclaughlin ZOX:[id number] DOB: May 11, 1945 DOA: 05/24/2014 PCP: No PCP Per Patient  Admit HPI / Brief Narrative: 69 year old WM PMHx hypertension, previous stroke, alcohol abuse, atrial fibrillation, alcohol abuse, and hyperlipidemia, and presented with new onset right facial droop and slurred speech. Patient has not been compliant with his medications including his Xarelto and blood pressure medication. In ED, CT head with no acute hemorrhage, with hypodensity focus in the left occipital lobe. Patient was not given TPA due to delayed arrival. Neurology was consulted, and pt admitted with stroke workup  HPI/Subjective: 3/11 A/O 4, NAD  Assessment/Plan: Probable left brain embolic stroke, likely MCA territory -CT head- small hypodensity in left occipital lobe -MRI/MRA head- ? 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 Placed on aspirin 300 MG, with recommendation of NOAC once able to swallow -Carotid Doppler with bilateral 1-39% ICA stenosis -hemoglobin A1c 5.3, LDL 82 -Keep NPO, SLP pending -recommendation for CIR; unfortunately secondary to family issues will most likely discharge to SNF -3/11 swallow study cleared for dysphagia 1 nectar thick liquid -Difficulty handling her own secretions start scopolamine patch  Previous history of stroke -The patient not compliant on Xarelto, however after long talk with patient appears may be more amenable to taking medication properly -Discuss anticoagulation prior to discharge  Atrial fibrillation -Heart rate stable -Per cardiology- no indication for rate lowering med now  Hypertension -Patient's BP is trending up will restart Coreg at lower dose  3.125 mg BID  Hyperlipidemia -LDL 82 -Will restart statin once able to take PO  Chronic Systolic heart failure Appears euvolemic -2D echo -EF  25-30% -Daily weight -Strict in and out  Tobacco abuse Educated cessation  Alcohol abuse -Patient not taking anticoagulation secondary to frequent alcohol use -Counseled patient extensively that he is at significantly increased risk of future stroke which could leave him severely disabled or result in his death. -Will discuss risking restarting anticoagulation prior to his discharge  Acute Renal Failure -Improving with hydration, continue normal saline 13500ml/hr      Code Status: DNR Family Communication: no family present at time of exam Disposition Plan: Transfer to tele floor, HR controlled Consultants: Dr. Micki RileyPramod S Sethi. Neurology, stroke team [facility]     Procedure/Significant Events: CT head:- Small subcentimeter focus of hypodensity Lt occipital lobe, may reflect new site of ischemia versus  -Encephalomalacia in the left frontal lobe. -Chronic small vessel ischemic change and remote lacunar infarct in the right caudate. 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    Culture None  Antibiotics: None  DVT prophylaxis: SCDs   Devices None  LINES / TUBES:  NOne   Continuous Infusions:   Objective: VITAL SIGNS: Temp: 98 F (36.7 C) (03/11 1245) Temp Source: Oral (03/11 1245) BP: 180/89 mmHg (03/11 0722) Pulse Rate: 77 (03/11 0722) SPO2; FIO2:   Intake/Output Summary (Last 24 hours) at 05/26/14 1514 Last data filed at 05/26/14 1245  Gross per 24 hour  Intake 369.16 ml  Output    851 ml  Net -481.84 ml     Exam: General: A/O 4, Obese Caucasian male in NAD, mild aphasia with difficulty with word finding. W/ right facial weekness Lungs: Clear to auscultation bilaterally without wheezes or crackles. Cardiovascular: Regular rate and rhythm without murmur gallop or rub normal S1 and S2 Abdomen: Nontender, obese abdomen, soft, bowel  sounds positive, no rebound, no ascites, no  appreciable mass Extremities: Mild weak RUE grip and strength.  Neurologic; cranial nerve II through XII intact, tongue/uvula midline, right facial droop, right-sided weakness extremity strength 4/5, finger nose finger within normal limits on left, pass pointing on right, unable to perform quick finger touch right hand. Patient has a shuffling gait requiring 2 person assist. Continued dysarthria, but can be understood.  Data Reviewed: Basic Metabolic Panel:  Recent Labs Lab 05/24/14 0510 05/24/14 0518 05/26/14 0254  NA 138 139 136  K 4.1 4.1 4.0  CL 105 104 102  CO2 27  --  24  GLUCOSE 103* 99 86  BUN CREATININE 2.04* 2.00* 1.79*  CALCIUM 8.9  --  9.1   Liver Function Tests:  Recent Labs Lab 05/24/14 0510  AST 20  ALT 12  ALKPHOS 62  BILITOT 0.6  PROT 6.9  ALBUMIN 3.3*   No results for input(s): LIPASE, AMYLASE in the last 168 hours. No results for input(s): AMMONIA in the last 168 hours. CBC:  Recent Labs Lab 05/24/14 0510 05/24/14 0518 05/26/14 0254  WBC 9.0  --  7.4  NEUTROABS 6.2  --   --   HGB 14.3 16.3 14.9  HCT 41.9 48.0 44.0  MCV 99.5  --  98.4  PLT 128*  --  129*   Cardiac Enzymes: No results for input(s): CKTOTAL, CKMB, CKMBINDEX, TROPONINI in the last 168 hours. BNP (last 3 results) No results for input(s): BNP in the last 8760 hours.  ProBNP (last 3 results) No results for input(s): PROBNP in the last 8760 hours.  CBG: No results for input(s): GLUCAP in the last 168 hours.  Recent Results (from the past 240 hour(s))  MRSA PCR Screening     Status: None   Collection Time: 05/24/14  1:00 PM  Result Value Ref Range Status   MRSA by PCR NEGATIVE NEGATIVE Final    Comment:        The GeneXpert MRSA Assay (FDA approved for NASAL specimens only), is one component of a comprehensive MRSA colonization surveillance program. It is not intended to diagnose MRSA infection nor to guide or monitor treatment for MRSA infections.       Studies:  Recent x-ray studies have been reviewed in detail by the Attending Physician  Scheduled Meds:  Scheduled Meds: .  stroke: mapping our early stages of recovery book   Does not apply Once  . antiseptic oral rinse  7 mL Mouth Rinse BID  . aspirin  324 mg Oral Daily  . heparin  5,000 Units Subcutaneous 3 times per day  . nicotine  21 mg Transdermal Daily    Time spent on care of this patient: 40 mins   WOODS, Roselind Messier , MD  [facility] Office  [phone] Pager - [phone]  On-Call/Text Page:      [email or web address]      password [email or web address]  If 7PM-7AM, please contact night-coverage [email or web address] Password [email or web address] 05/26/2014, 3:14 PM   LOS: 2 days   Care during the described time interval was provided by me .  I have reviewed this patient's available data, including medical history, events of note, physical examination, radiology studies and test results as part of my evaluation  Carolyne Littles, MD [phone] Pager

## 2014-05-26 NOTE — Progress Notes (Signed)
Rehab admissions - I did call and speak with pt's brother Durene Fruits late yesterday afternoon. Discussion was had about pt's need for further rehab and pt's brother is not able to have pt come live with them in [place] after a possible rehab admit. Pt's brother is also in agreement that SNF is the best option for pt.  This am, I met with pt to share this update about his brother's response and pt is agreeable to pursuing SNF. I called and updated Dysheka, Education officer, museum.  I will now sign off pt's case and we are recommending that SNF be pursued in light of decreased family support.  Thanks.  Nanetta Batty, PT Rehabilitation Admissions Coordinator [phone]

## 2014-05-26 NOTE — Progress Notes (Signed)
Medicare Important Message given? YES (If response is "NO", the following Medicare IM given date fields will be blank) Date Medicare IM given:05/26/2014 Medicare IM given by: Lillianna Sabel 

## 2014-05-26 NOTE — Clinical Social Work Psychosocial (Signed)
Clinical Social Work Department BRIEF PSYCHOSOCIAL ASSESSMENT 05/26/2014  Patient:  Brian Mclaughlin, Brian Mclaughlin     Account Number:  [id number]     Admit date:  05/24/2014  Clinical Social Worker:  Marciano Sequin  Date/Time:  05/26/2014 09:44 AM  Referred by:  RN  Date Referred:  05/26/2014 Referred for  SNF Placement   Other Referral:   Interview type:  Patient Other interview type:    PSYCHOSOCIAL DATA Living Status:  ALONE Admitted from facility:   Level of care:   Primary support name:  Swint,[name] Primary support relationship to patient:  FAMILY Degree of support available:   Fair Support    CURRENT CONCERNS Current Concerns  None Noted   Other Concerns:    SOCIAL WORK ASSESSMENT / PLAN CSW met the pt at the bedside.  CSW introduced self and purpose of the visit. CSW discussed clinical recommendation for SNF rehab. CSW inquired about the geographical location in which the pt would like to receive rehab from. Pt reported wanting to remain in [facility]. CSW explained the SNF rehab process to the pt. CSW and pt discussed insurance and its relation to SNF rehab. CSW answered all questions in which the pt inquired about. CSW provided pt with contact information for further questions. CSW will continue to follow this pt and assist with discharge as needed.   Assessment/plan status:  Psychosocial Support/Ongoing Assessment of Needs Other assessment/ plan:   Information/referral to community resources:    PATIENT'S/FAMILY'S RESPONSE TO CURRENT DIAGNOSE: Pt reported being unsure about his current diagnose. Pt reported feeling better today then yesterday. Pt reported waiting the test result to determine if he has had another stroke.    PATIENT'S/FAMILY'S RESPONSE TO PLAN OF CARE: Pt was receptive during assessment. Pt is now agreeable for SNF placement.    [place], MSW, [place]

## 2014-05-26 NOTE — Clinical Social Work Placement (Signed)
Clinical Social Work Department CLINICAL SOCIAL WORK PLACEMENT NOTE 05/26/2014  Patient:  [name],Brian C  Account Number:  [id number] Admit date:  05/24/2014  Clinical Social Worker:  Gwynne EdingerYSHEKA Esabella Stockinger, LCSWA  Date/time:  05/26/2014 09:59 AM  Clinical Social Work is seeking post-discharge placement for this patient at the following level of care:   SKILLED NURSING   (*CSW will update this form in Epic as items are completed)   05/26/2014  [facility]/family provided with [facility] Clinical Social Work's list of facilities offering this level of care within the geographic area requested by the patient (or if unable, by the patient's family).  05/26/2014  Patient/family informed of their freedom to choose among providers that offer the needed level of care, that participate in Medicare, Medicaid or managed care program needed by the patient, have an available bed and are willing to accept the patient.  05/26/2014  Patient/family informed of [facility]' ownership interest in [facility], as well as of the fact that they are under no obligation to receive care at this facility.  PASARR submitted to EDS on  PASARR number received on   FL2 transmitted to all facilities in geographic area requested by pt/family on  05/26/2014 FL2 transmitted to all facilities within larger geographic area on 05/26/2014  Patient informed that his/her managed care company has contracts with or will negotiate with  certain facilities, including the following:     Patient/family informed of bed offers received:   Patient chooses bed at  Physician recommends and patient chooses bed at    Patient to be transferred to  on   Patient to be transferred to facility by  Patient and family notified of transfer on  Name of family member notified:    The following physician request were entered in Epic:   Additional Comments:  Pt has prior existing PASSAR.   [name], MSW,  LCSWA [phone]

## 2014-05-26 NOTE — Procedures (Signed)
Objective Swallowing Evaluation:    Patient Details  Name: Brian Mclaughlin MRN: [id number] Date of Birth: 03/28/1945  Today's Date: 05/26/2014 Time: SLP Start Time (ACUTE ONLY): 1045-SLP Stop Time (ACUTE ONLY): 1110 SLP Time Calculation (min) (ACUTE ONLY): 25 min  Past Medical History:  Past Medical History  Diagnosis Date  . Hypertension   . Tobacco abuse   . ETOH abuse   . Hyperlipidemia   . Chronic systolic CHF (congestive heart failure)   . Alcohol abuse   . Tobacco use disorder   . Atrial fibrillation   . Acute kidney failure, unspecified   . Rhabdomyolysis   . Altered mental status   . Nonspecific elevation of levels of transaminase or lactic acid dehydrogenase (LDH)   . Acute, but ill-defined, cerebrovascular disease   . CVA (cerebral infarction)    Past Surgical History:  Past Surgical History  Procedure Laterality Date  . Eye muscle surgery      as a teenager "tighten his muscles"  . Back surgery  10 years ago    "slipped disc" repair   HPI:  HPI: Pt is a 69 y.o. right handed male with a history of hypertension, previous stroke, alcohol and tobacco abuse, CHF, atrial fibrillation and hyperlipidemia, presenting with new onset right facial droop and slurred speech on 05-24-14. Pt with history of medicine noncompliance. CT scan of his head showed no acute intracranial hemorrhage. A subcentimeter focus of hypodensity was noted in the left occipital lobe, possibly a small acute stroke. MRI of the brain shows infarct of left internal capsule,  right frontal parietal region involvement, and  infarct of right basal ganglia.  There was encephalomalacia in the left frontal lobe from previous stroke. NIH stroke score was 4. TPA not administered secondary to delay in arrival. Pt was independent prior to admission and living alone. Pt currently noted to have severe dysarthria affecting expreesive communication intelligibility  No Data Recorded  Assessment / Plan / Recommendation CHL IP  CLINICAL IMPRESSIONS 05/26/2014  Dysphagia Diagnosis Moderate oral phase dysphagia;Mild pharyngeal phase dysphagia  Clinical impression Pt presents with a moderate oropharyngeal dysphagia marked primarily by significant delays in initiation, as well as sensorimotor impairments.  Oral phase was delayed with piecemeal bolusing.  There was pre-deglutitive filling of the hypophayrnx, with materials remaining there as long as a minute before a swallow response was triggered.  High penetration of nectar-thick liquids noted on fewer than 10% of boluses.  Trace aspiration (silent) of thin liquids observed just below the level of the vocal folds.  Chin tuck was effective in eliminating aspiration, but use of chin tuck exacerbated swallow delays significantly.  Recommend initiating a dysphagia 1 diet with nectar-thick liquids; meds crushed in puree.  Pt will require 1:1 supervision with meals to ensure safety.  He will require frequent verbal/tactile cues to initiate and follow-through with swallow response.  SLP will follow for therapeutic exercise and diet toleration.       CHL IP TREATMENT RECOMMENDATION 05/26/2014  Treatment Plan Recommendations Therapy as outlined in treatment plan below     CHL IP DIET RECOMMENDATION 05/26/2014  Diet Recommendations Dysphagia 1 (Puree);Nectar-thick liquid  Liquid Administration via Cup  Medication Administration Crushed with puree  Compensations Slow rate;Small sips/bites  Postural Changes and/or Swallow Maneuvers Seated upright 90 degrees     CHL IP OTHER RECOMMENDATIONS 05/26/2014  Recommended Consults (None)  Oral Care Recommendations Oral care BID  Other Recommendations Order thickener from pharmacy     CHL IP FOLLOW UP  RECOMMENDATIONS 05/25/2014  Follow up Recommendations Inpatient Rehab     CHL IP FREQUENCY AND DURATION 05/26/2014  Speech Therapy Frequency (ACUTE ONLY) min 3x week  Treatment Duration (None)         SLP Swallow Goals No flowsheet data  found.  No flowsheet data found.    CHL IP REASON FOR REFERRAL 05/26/2014  Reason for Referral Objectively evaluate swallowing function     CHL IP ORAL PHASE 05/26/2014  Lips (None)  Tongue (None)  Mucous membranes (None)  Nutritional status (None)  Other (None)  Oxygen therapy (None)  Oral Phase Impaired  Oral - Pudding Teaspoon (None)  Oral - Pudding Cup (None)  Oral - Honey Teaspoon (None)  Oral - Honey Cup (None)  Oral - Honey Syringe (None)  Oral - Nectar Teaspoon NT  Oral - Nectar Cup Right pocketing in lateral sulci;Piecemeal swallowing;Right anterior bolus loss;Holding of bolus  Oral - Nectar Straw (None)  Oral - Nectar Syringe (None)  Oral - Ice Chips (None)  Oral - Thin Teaspoon (None)  Oral - Thin Cup Right pocketing in lateral sulci;Piecemeal swallowing;Right anterior bolus loss  Oral - Thin Straw (None)  Oral - Thin Syringe (None)  Oral - Puree Right pocketing in lateral sulci;Piecemeal swallowing;Right anterior bolus loss;Holding of bolus;Delayed oral transit  Oral - Mechanical Soft Right pocketing in lateral sulci;Piecemeal swallowing;Right anterior bolus loss;Holding of bolus;Delayed oral transit  Oral - Regular (None)  Oral - Multi-consistency (None)  Oral - Pill (None)  Oral Phase - Comment (None)      CHL IP PHARYNGEAL PHASE 05/26/2014  Pharyngeal Phase Impaired  Pharyngeal - Pudding Teaspoon (None)  Penetration/Aspiration details (pudding teaspoon) (None)  Pharyngeal - Pudding Cup (None)  Penetration/Aspiration details (pudding cup) (None)  Pharyngeal - Honey Teaspoon (None)  Penetration/Aspiration details (honey teaspoon) (None)  Pharyngeal - Honey Cup (None)  Penetration/Aspiration details (honey cup) (None)  Pharyngeal - Honey Syringe (None)  Penetration/Aspiration details (honey syringe) (None)  Pharyngeal - Nectar Teaspoon Delayed swallow initiation;Premature spillage to pyriform sinuses;Reduced airway/laryngeal closure;Penetration/Aspiration  during swallow;Pharyngeal residue - valleculae  Penetration/Aspiration details (nectar teaspoon) Material enters airway, remains ABOVE vocal cords then ejected out  Pharyngeal - Nectar Cup Delayed swallow initiation;Premature spillage to pyriform sinuses;Reduced airway/laryngeal closure;Penetration/Aspiration during swallow;Pharyngeal residue - valleculae  Penetration/Aspiration details (nectar cup) Material enters airway, remains ABOVE vocal cords then ejected out  Pharyngeal - Nectar Straw (None)  Penetration/Aspiration details (nectar straw) (None)  Pharyngeal - Nectar Syringe (None)  Penetration/Aspiration details (nectar syringe) (None)  Pharyngeal - Ice Chips (None)  Penetration/Aspiration details (ice chips) (None)  Pharyngeal - Thin Teaspoon (None)  Penetration/Aspiration details (thin teaspoon) (None)  Pharyngeal - Thin Cup Delayed swallow initiation;Premature spillage to pyriform sinuses;Reduced airway/laryngeal closure;Penetration/Aspiration during swallow;Pharyngeal residue - valleculae;Trace aspiration  Penetration/Aspiration details (thin cup) Material enters airway, CONTACTS cords and not ejected out  Pharyngeal - Thin Straw (None)  Penetration/Aspiration details (thin straw) (None)  Pharyngeal - Thin Syringe (None)  Penetration/Aspiration details (thin syringe') (None)  Pharyngeal - Puree Delayed swallow initiation;Pharyngeal residue - valleculae;Premature spillage to valleculae;Reduced tongue base retraction  Penetration/Aspiration details (puree) (None)  Pharyngeal - Mechanical Soft Delayed swallow initiation;Pharyngeal residue - valleculae;Premature spillage to valleculae;Reduced tongue base retraction  Penetration/Aspiration details (mechanical soft) (None)  Pharyngeal - Regular (None)  Penetration/Aspiration details (regular) (None)  Pharyngeal - Multi-consistency (None)  Penetration/Aspiration details (multi-consistency) (None)  Pharyngeal - Pill (None)   Penetration/Aspiration details (pill) (None)  Pharyngeal Comment (None)     No flowsheet data found.  No flowsheet  data found.        Calyn Sivils L. [name], [place] CCC/SLP Pager [phone]  [name] [name] 05/26/2014, 4:54 PM

## 2014-05-26 NOTE — Evaluation (Signed)
Speech Language Pathology Evaluation Patient Details Name: Brian Mclaughlin MRN: [id number] DOB: 11-11-1945 Today's Date: 05/26/2014 Time: 4098-1191 SLP Time Calculation (min) (ACUTE ONLY): 45 min  Problem List:  Patient Active Problem List   Diagnosis Date Noted  . Chronic atrial fibrillation   . Stroke, embolic   . Noncompliance with medications   . Acute CVA (cerebrovascular accident) 05/24/2014  . Acute ischemic stroke 05/24/2014  . CVA (cerebral infarction) 05/24/2014  . Chronic systolic CHF (congestive heart failure)   . Acute systolic CHF (congestive heart failure) 08/25/2011  . Stroke 08/23/2011  . HTN (hypertension) 08/23/2011  . Alcohol abuse 08/23/2011  . Tobacco abuse 08/23/2011  . LFTs abnormal 08/23/2011  . Acute renal failure 08/23/2011  . Rhabdomyolysis 08/23/2011  . Atrial fibrillation 08/23/2011   Past Medical History:  Past Medical History  Diagnosis Date  . Hypertension   . Tobacco abuse   . ETOH abuse   . Hyperlipidemia   . Chronic systolic CHF (congestive heart failure)   . Alcohol abuse   . Tobacco use disorder   . Atrial fibrillation   . Acute kidney failure, unspecified   . Rhabdomyolysis   . Altered mental status   . Nonspecific elevation of levels of transaminase or lactic acid dehydrogenase (LDH)   . Acute, but ill-defined, cerebrovascular disease   . CVA (cerebral infarction)    Past Surgical History:  Past Surgical History  Procedure Laterality Date  . Eye muscle surgery      as a teenager "tighten his muscles"  . Back surgery  10 years ago    "slipped disc" repair   HPI:  Pt is a 69 y.o. right handed male with a history of hypertension, previous stroke, alcohol and tobacco abuse, CHF, atrial fibrillation and hyperlipidemia, presenting with new onset right facial droop and slurred speech on 05-24-14. Pt with history of medicine noncompliance. CT scan of his head showed no acute intracranial hemorrhage. A subcentimeter focus of hypodensity  was noted in the left occipital lobe, possibly a small acute stroke. MRI of the brain shows infarct of left internal capsule,  right frontal parietal region involvement, and  infarct of right basal ganglia.  There was encephalomalacia in the left frontal lobe from previous stroke. NIH stroke score was 4. TPA not administered secondary to delay in arrival. Pt was independent prior to admission and living alone.  Assessment / Plan / Recommendation Clinical Impression  Pt presents with a moderate mixed dysarthria, moderate deficits in initiation and mild difficulty with short-term recall.  Scored  26/30 per the [facility].  Deficits in initiation and reduced response time mask pt's retained cognitive abilities.  Recommend acute SLP to address functional speech and initiation deficits.     SLP Assessment  Patient needs continued Speech Lanaguage Pathology Services    Follow Up Recommendations       Frequency and Duration min 3x week  2 weeks   Pertinent Vitals/Pain Pain Assessment: No/denies pain   SLP Goals  Potential to Achieve Goals (ACUTE ONLY): Good  SLP Evaluation Prior Functioning  Cognitive/Linguistic Baseline: Information not available Type of Home: House   Cognition  Overall Cognitive Status: Impaired/Different from baseline Arousal/Alertness: Awake/alert Orientation Level: Oriented X4 Attention: Selective Selective Attention: Appears intact Memory: Impaired Memory Impairment: Storage deficit;Retrieval deficit Awareness: Impaired Awareness Impairment: Intellectual impairment Problem Solving: Appears intact Safety/Judgment:  (tba)    Comprehension  Auditory Comprehension Overall Auditory Comprehension: Appears within functional limits for tasks assessed Visual Recognition/Discrimination Discrimination: Within Function Limits Reading  Comprehension Reading Status: Not tested    Expression Expression Primary Mode of Expression: Verbal Verbal Expression Overall Verbal  Expression: Appears within functional limits for tasks assessed Written Expression Dominant Hand: Right   Oral / Motor Oral Motor/Sensory Function Overall Oral Motor/Sensory Function: Impaired Motor Speech Overall Motor Speech: Impaired Respiration: Impaired Level of Impairment: Sentence Phonation: Breathy Resonance: Hypernasality Articulation: Impaired Level of Impairment: Sentence Intelligibility: Intelligibility reduced Word: 50-74% accurate Phrase: 50-74% accurate Sentence: 50-74% accurate Conversation: 50-74% accurate Motor Planning: Witnin functional limits   Merly Hinkson L. Samson Fredericouture, [place] CCC/SLP Pager [phone]      Blenda MountsCouture, Trayvond Viets [name] 05/26/2014, 5:13 PM

## 2014-05-27 LAB — CBC WITH DIFFERENTIAL/PLATELET
Basophils Absolute: 0.1 10*3/uL (ref 0.0–0.1)
Basophils Relative: 1 % (ref 0–1)
Eosinophils Absolute: 0.3 10*3/uL (ref 0.0–0.7)
Eosinophils Relative: 3 % (ref 0–5)
HEMATOCRIT: 42 % (ref 39.0–52.0)
Hemoglobin: 14.4 g/dL (ref 13.0–17.0)
Lymphocytes Relative: 37 % (ref 12–46)
Lymphs Abs: 2.7 10*3/uL (ref 0.7–4.0)
MCH: 33.6 pg (ref 26.0–34.0)
MCHC: 34.3 g/dL (ref 30.0–36.0)
MCV: 98.1 fL (ref 78.0–100.0)
Monocytes Absolute: 1 10*3/uL (ref 0.1–1.0)
Monocytes Relative: 13 % — ABNORMAL HIGH (ref 3–12)
NEUTROS ABS: 3.3 10*3/uL (ref 1.7–7.7)
Neutrophils Relative %: 46 % (ref 43–77)
PLATELETS: 125 10*3/uL — AB (ref 150–400)
RBC: 4.28 MIL/uL (ref 4.22–5.81)
RDW: 12.5 % (ref 11.5–15.5)
WBC: 7.4 10*3/uL (ref 4.0–10.5)

## 2014-05-27 LAB — COMPREHENSIVE METABOLIC PANEL
ALBUMIN: 3 g/dL — AB (ref 3.5–5.2)
ALK PHOS: 57 U/L (ref 39–117)
ALT: 13 U/L (ref 0–53)
ANION GAP: 10 (ref 5–15)
AST: 21 U/L (ref 0–37)
BILIRUBIN TOTAL: 1.3 mg/dL — AB (ref 0.3–1.2)
BUN: 18 mg/dL (ref 6–23)
CO2: 21 mmol/L (ref 19–32)
CREATININE: 1.74 mg/dL — AB (ref 0.50–1.35)
Calcium: 8.9 mg/dL (ref 8.4–10.5)
Chloride: 106 mmol/L (ref 96–112)
GFR calc Af Amer: 44 mL/min — ABNORMAL LOW (ref >90)
GFR, EST NON AFRICAN AMERICAN: 38 mL/min — AB (ref >90)
Glucose, Bld: 92 mg/dL (ref 70–99)
Potassium: 4.2 mmol/L (ref 3.5–5.1)
SODIUM: 137 mmol/L (ref 135–145)
TOTAL PROTEIN: 6.2 g/dL (ref 6.0–8.3)

## 2014-05-27 MED ORDER — ATORVASTATIN CALCIUM 20 MG PO TABS
20.0000 mg | ORAL_TABLET | Freq: Every day | ORAL | Status: DC
  Administered 2014-05-27 – 2014-05-28 (×2): 20 mg via ORAL
  Filled 2014-05-27 (×4): qty 1

## 2014-05-27 MED ORDER — LEVALBUTEROL HCL 0.63 MG/3ML IN NEBU
0.6300 mg | INHALATION_SOLUTION | Freq: Four times a day (QID) | RESPIRATORY_TRACT | Status: DC | PRN
  Administered 2014-05-27: 0.63 mg via RESPIRATORY_TRACT
  Filled 2014-05-27 (×2): qty 3

## 2014-05-27 NOTE — Progress Notes (Signed)
Attempted to call report to 5W, receiving RN to call back. Will continue to monitor.

## 2014-05-27 NOTE — Progress Notes (Signed)
[facility] TEAM 1 - Stepdown/ICU TEAM Progress Note  Brian Mclaughlin:[id number] DOB: [birth year] DOA: 05/24/2014 PCP: No PCP Per Patient  Admit HPI / Brief Narrative: 69 year old WM PMHx hypertension, previous stroke, alcohol abuse, atrial fibrillation, alcohol abuse, and hyperlipidemia, and presented with new onset right facial droop and slurred speech. Patient has not been compliant with his medications including his Xarelto and blood pressure medication. In ED, CT head with no acute hemorrhage, with hypodensity focus in the left occipital lobe. Patient was not given TPA due to delayed arrival. Neurology was consulted, and pt admitted with stroke workup  HPI/Subjective: 3/12 A/O 4, NAD, concern that the social worker has not come to see him and help narrow down SNF selection.  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 Placed on aspirin 300 MG, with recommendation of NOAC once able to swallow -Carotid Doppler with bilateral 1-39% ICA stenosis -hemoglobin A1c 5.3, LDL 82 -Keep NPO, SLP pending -recommendation for CIR; unfortunately secondary to family issues will most likely discharge to SNF -3/11 swallow study cleared for dysphagia 1 nectar thick liquid -Difficulty handling  own secretions continue scopolamine patch  Previous history of stroke -The patient not compliant on Xarelto, however after long talk with patient appears may be more amenable to taking medication properly -Discuss anticoagulation prior to discharge  Atrial fibrillation -Heart rate stable -Per cardiology- no indication for rate lowering med now  Hypertension -Continue Coreg at lower dose  3.125 mg BID  Hyperlipidemia -LDL 82 -Start Lipitor 20 mg daily  Chronic Systolic heart  failure Appears euvolemic -2D echo -EF 25-30% -Daily weight -Strict in and out since admission-2.2 L  Tobacco abuse Educated cessation  Alcohol abuse -Patient not taking anticoagulation secondary to frequent alcohol use -Counseled patient extensively that he is at significantly increased risk of future stroke which could leave him severely disabled or result in his death. -Will discuss risking restarting anticoagulation prior to his discharge  Acute Renal Failure -Improving with hydration, continue normal saline 165ml/hr      Code Status: DNR Family Communication: no family present at time of exam Disposition Plan: Transfer to tele floor, HR controlled Consultants: Dr. Micki Riley. Neurology, stroke team [facility]     Procedure/Significant Events: CT head:- Small subcentimeter focus of hypodensity Lt occipital lobe, may reflect new site of ischemia versus  -Encephalomalacia in the left frontal lobe. -Chronic small vessel ischemic change and remote lacunar infarct in the right caudate. 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?Marland Kitchen    Culture None  Antibiotics: None  DVT prophylaxis: SCDs   Devices None  LINES / TUBES:  NOne   Continuous Infusions: . sodium chloride 100 mL/hr at 05/27/14 0516    Objective: VITAL SIGNS: Temp: 98.6 F (37 C) (03/12 1241) Temp Source: Oral (03/12 1241) BP: 137/82 mmHg (03/12 1249) Pulse Rate: 61 (03/12 1249) SPO2; FIO2:   Intake/Output Summary (Last 24 hours) at 05/27/14 1529 Last data filed at 05/27/14 1330  Gross per 24 hour  Intake   1960 ml  Output   1375 ml  Net    585 ml     Exam: General: A/O 4, NAD, mild expressive aphasia, dysarthria . W/ right facial weekness Lungs: Clear to auscultation bilaterally without wheezes or crackles. Cardiovascular: Regular  rate and rhythm without murmur gallop or rub normal S1 and S2 Abdomen: Nontender,  obese abdomen, soft, bowel sounds positive, no rebound, no ascites, no appreciable mass Extremities: Mild weak RUE grip and strength.  Neurologic; cranial nerve II through XII intact, tongue/uvula midline, right facial droop, right-sided weakness extremity strength 4/5, finger nose finger within normal limits on left, pass pointing on right, unable to perform quick finger touch right hand. Patient has a shuffling gait requiring 2 person assist. Continued expressive aphasia, dysarthria, but can be understood.  Data Reviewed: Basic Metabolic Panel:  Recent Labs Lab 05/24/14 0510 05/24/14 0518 05/26/14 0254 05/27/14 0312  NA 138 139 136 137  K 4.1 4.1 4.0 4.2  CL 105 104 102 106  CO2 27  --  24 21  GLUCOSE 103* 99 86 92  BUN 18 19 14 18   CREATININE 2.04* 2.00* 1.79* 1.74*  CALCIUM 8.9  --  9.1 8.9   Liver Function Tests:  Recent Labs Lab 05/24/14 0510 05/27/14 0312  AST 20 21  ALT 12 13  ALKPHOS 62 57  BILITOT 0.6 1.3*  PROT 6.9 6.2  ALBUMIN 3.3* 3.0*   No results for input(s): LIPASE, AMYLASE in the last 168 hours. No results for input(s): AMMONIA in the last 168 hours. CBC:  Recent Labs Lab 05/24/14 0510 05/24/14 0518 05/26/14 0254 05/27/14 0312  WBC 9.0  --  7.4 7.4  NEUTROABS 6.2  --   --  3.3  HGB 14.3 16.3 14.9 14.4  HCT 41.9 48.0 44.0 42.0  MCV 99.5  --  98.4 98.1  PLT 128*  --  129* 125*   Cardiac Enzymes: No results for input(s): CKTOTAL, CKMB, CKMBINDEX, TROPONINI in the last 168 hours. BNP (last 3 results) No results for input(s): BNP in the last 8760 hours.  ProBNP (last 3 results) No results for input(s): PROBNP in the last 8760 hours.  CBG: No results for input(s): GLUCAP in the last 168 hours.  Recent Results (from the past 240 hour(s))  MRSA PCR Screening     Status: None   Collection Time: 05/24/14  1:00 PM  Result Value Ref Range Status   MRSA by PCR NEGATIVE NEGATIVE Final    Comment:        The GeneXpert MRSA Assay (FDA approved  for NASAL specimens only), is one component of a comprehensive MRSA colonization surveillance program. It is not intended to diagnose MRSA infection nor to guide or monitor treatment for MRSA infections.      Studies:  Recent x-ray studies have been reviewed in detail by the Attending Physician  Scheduled Meds:  Scheduled Meds: .  stroke: mapping our early stages of recovery book   Does not apply Once  . antiseptic oral rinse  7 mL Mouth Rinse BID  . aspirin  324 mg Oral Daily  . carvedilol  3.125 mg Oral BID WC  . heparin  5,000 Units Subcutaneous 3 times per day  . nicotine  21 mg Transdermal Daily  . scopolamine  1 patch Transdermal Q72H    Time spent on care of this patient: 40 mins   WOODS, Roselind MessierURTIS J , MD  [facility] Office  [phone] Pager - [phone]  On-Call/Text Page:      [email or web address]      password [email or web address]  If 7PM-7AM, please contact night-coverage [email or web address] Password [email or web address] 05/27/2014, 3:29 PM   LOS: 3 days   Care during the described time interval was provided by me .  I have reviewed  this patient's available data, including medical history, events of note, physical examination, radiology studies and test results as part of my evaluation  Dia Crawford, MD [phone] Pager

## 2014-05-27 NOTE — Progress Notes (Signed)
Report received from Abram Sander Moseley, RN in [address]. Awaiting pt's arrival.

## 2014-05-27 NOTE — Progress Notes (Addendum)
Report called to [place], Charity fundraiserN. All questions answered, will transfer to 5W.

## 2014-05-28 LAB — COMPREHENSIVE METABOLIC PANEL
ALT: 19 U/L (ref 0–53)
ANION GAP: 9 (ref 5–15)
AST: 32 U/L (ref 0–37)
Albumin: 2.9 g/dL — ABNORMAL LOW (ref 3.5–5.2)
Alkaline Phosphatase: 57 U/L (ref 39–117)
BILIRUBIN TOTAL: 1.1 mg/dL (ref 0.3–1.2)
BUN: 15 mg/dL (ref 6–23)
CO2: 23 mmol/L (ref 19–32)
Calcium: 8.8 mg/dL (ref 8.4–10.5)
Chloride: 105 mmol/L (ref 96–112)
Creatinine, Ser: 1.7 mg/dL — ABNORMAL HIGH (ref 0.50–1.35)
GFR calc Af Amer: 46 mL/min — ABNORMAL LOW (ref >90)
GFR, EST NON AFRICAN AMERICAN: 39 mL/min — AB (ref >90)
Glucose, Bld: 88 mg/dL (ref 70–99)
Potassium: 3.8 mmol/L (ref 3.5–5.1)
SODIUM: 137 mmol/L (ref 135–145)
TOTAL PROTEIN: 6 g/dL (ref 6.0–8.3)

## 2014-05-28 LAB — CBC WITH DIFFERENTIAL/PLATELET
Basophils Absolute: 0.1 10*3/uL (ref 0.0–0.1)
Basophils Relative: 1 % (ref 0–1)
EOS ABS: 0.2 10*3/uL (ref 0.0–0.7)
Eosinophils Relative: 3 % (ref 0–5)
HCT: 41.1 % (ref 39.0–52.0)
HEMOGLOBIN: 14 g/dL (ref 13.0–17.0)
LYMPHS ABS: 2.4 10*3/uL (ref 0.7–4.0)
Lymphocytes Relative: 36 % (ref 12–46)
MCH: 33.4 pg (ref 26.0–34.0)
MCHC: 34.1 g/dL (ref 30.0–36.0)
MCV: 98.1 fL (ref 78.0–100.0)
Monocytes Absolute: 0.8 10*3/uL (ref 0.1–1.0)
Monocytes Relative: 11 % (ref 3–12)
Neutro Abs: 3.2 10*3/uL (ref 1.7–7.7)
Neutrophils Relative %: 48 % (ref 43–77)
Platelets: 131 10*3/uL — ABNORMAL LOW (ref 150–400)
RBC: 4.19 MIL/uL — ABNORMAL LOW (ref 4.22–5.81)
RDW: 12.6 % (ref 11.5–15.5)
WBC: 6.7 10*3/uL (ref 4.0–10.5)

## 2014-05-28 MED ORDER — RIVAROXABAN 20 MG PO TABS
20.0000 mg | ORAL_TABLET | Freq: Every day | ORAL | Status: DC
  Administered 2014-05-28: 20 mg via ORAL
  Filled 2014-05-28 (×2): qty 1

## 2014-05-28 MED ORDER — FUROSEMIDE 10 MG/ML IJ SOLN
20.0000 mg | Freq: Once | INTRAMUSCULAR | Status: AC
  Administered 2014-05-28: 20 mg via INTRAVENOUS
  Filled 2014-05-28: qty 2

## 2014-05-28 NOTE — Progress Notes (Signed)
Jule SerLee C Stowell is a 69 y.o. male patient who transferred  from 3S awake, alert  & orientated  X 3, has slurred speech . DNR, VSS - Blood pressure 161/99, pulse 65, temperature 97.8 F (36.6 C), temperature source Oral, resp. rate 16, height 6' (1.829 m), weight 109 kg (240 lb 4.8 oz), SpO2 96 %.,on room air, no c/o shortness of breath, no c/o chest pain, no distress noted. Tele # [phone] placed and pt is currently running A-fib  IV site WDL: intact in Lt AC  with a transparent dsg that's clean dry and intact.  Allergies:   Allergies  Allergen Reactions  . Codeine Rash  . Penicillins Rash     Past Medical History  Diagnosis Date  . Hypertension   . Tobacco abuse   . ETOH abuse   . Hyperlipidemia   . Chronic systolic CHF (congestive heart failure)   . Alcohol abuse   . Tobacco use disorder   . Atrial fibrillation   . Acute kidney failure, unspecified   . Rhabdomyolysis   . Altered mental status   . Nonspecific elevation of levels of transaminase or lactic acid dehydrogenase (LDH)   . Acute, but ill-defined, cerebrovascular disease   . CVA (cerebral infarction)     Pt orientation to unit, room and routine. SR up x 2, fall risk assessment complete with Patient and family verbalizing understanding of risks associated with falls. Pt understands how to use the call bell and to call for help before getting out of bed.  Skin, clean-dry- intact without evidence of bruising, or skin tears.   No evidence of skin break down noted on exam.     Will cont to monitor and assist as needed.  Lendell CapriceSapkota, Shaiden Aldous, RN 05/28/2014 12:53 AM

## 2014-05-28 NOTE — Progress Notes (Signed)
ANTICOAGULATION CONSULT NOTE - Initial Consult  Pharmacy Consult for Xarelto Indication: atrial fibrillation and stroke  Allergies  Allergen Reactions  . Codeine Rash  . Penicillins Rash    Patient Measurements: Height: 6' (182.9 cm) Weight: 240 lb 4.8 oz (109 kg) IBW/kg (Calculated) : 77.6   Vital Signs: Temp: 98.3 F (36.8 C) (03/13 1107) Temp Source: Oral (03/13 1107) BP: 122/84 mmHg (03/13 1107) Pulse Rate: 60 (03/13 1107)  Labs:  Recent Labs  05/26/14 0254 05/27/14 0312 05/28/14 0635  HGB 14.9 14.4 14.0  HCT 44.0 42.0 41.1  PLT 129* 125* 131*  CREATININE 1.79* 1.74* 1.70*    Estimated Creatinine Clearance: 52.3 mL/min (by C-G formula based on Cr of 1.7).   Medical History: Past Medical History  Diagnosis Date  . Hypertension   . Tobacco abuse   . ETOH abuse   . Hyperlipidemia   . Chronic systolic CHF (congestive heart failure)   . Alcohol abuse   . Tobacco use disorder   . Atrial fibrillation   . Acute kidney failure, unspecified   . Rhabdomyolysis   . Altered mental status   . Nonspecific elevation of levels of transaminase or lactic acid dehydrogenase (LDH)   . Acute, but ill-defined, cerebrovascular disease   . CVA (cerebral infarction)     Medications:  Prescriptions prior to admission  Medication Sig Dispense Refill Last Dose  . acetaminophen (TYLENOL) 500 MG tablet Take 1 tablet (500 mg total) by mouth every 6 (six) hours as needed for pain. (Patient not taking: Reported on 05/24/2014) 30 tablet 0 Taking  . carvedilol (COREG) 6.25 MG tablet Take 1 tablet (6.25 mg total) by mouth 2 (two) times daily with a meal. (Patient not taking: Reported on 05/24/2014) 60 tablet 5 02/02/2012 at 0800  . ofloxacin (FLOXIN) 0.3 % otic solution Place 5 drops into the right ear daily. (Patient not taking: Reported on 05/24/2014) 5 mL 0 Not Taking  . predniSONE (DELTASONE) 20 MG tablet Take 2 pills daily for 3 days, then one pill daily for 3 days (Patient not taking:  Reported on 05/24/2014) 9 tablet 0 Not Taking  . Rivaroxaban 20 MG TABS Take 20 mg by mouth daily. 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 He has a hx of Afib and a previous stroke. CHADSVASC score is 5 (CHF, HTN, stroke, age). Pharmacy consulted to resume Xarelto. SCr is elevated at 1.7 and CrCl ~63 based on TBW. No bleeding noted, Hb is normal, platelets are low at 131.  Goal of Therapy:  Therapeutic anticoagulation Monitor platelets by anticoagulation protocol: Yes   Plan:  - D/C SQ heparin - Xarelto 20 mg PO qdws - forst dose with lunch today - Monitor for s/sx of bleeding  [facility] HospitalJennifer [place], [address]Pharm.D., BCPS Clinical Pharmacist Pager: [phone] 05/28/2014 11:12 AM

## 2014-05-28 NOTE — Progress Notes (Signed)
Was the fall witnessed: yes  Patient condition before and after the fall: stable   Patient's reaction to the fall: patient is calm, denies pain.   Name of the doctor that was notified including date and time: Thedore MinsSingh   Any interventions and vital signs: RN and NT in room with patient during time of fall. Patient was lowered to floor, falls risk band, yellow socks placed. MD Thedore MinsSingh notified

## 2014-05-28 NOTE — Progress Notes (Signed)
[facility] TEAM 1 - Stepdown/ICU TEAM Progress Note  Brian Mclaughlin ZOX:[id number] DOB: June 10, 1945 DOA: 05/24/2014 PCP: No PCP Per Patient  Admit HPI / Brief Narrative: 69 year old WM PMHx hypertension, previous stroke, alcohol abuse, atrial fibrillation, alcohol abuse, and hyperlipidemia, and presented with new onset right facial droop and slurred speech. Patient has not been compliant with his medications including his Xarelto and blood pressure medication. In ED, CT head with no acute hemorrhage, with hypodensity focus in the left occipital lobe. Patient was not given TPA due to delayed arrival. Neurology was consulted, and pt admitted with stroke workup  HPI/Subjective: 3/12 A/O 4, NAD, concern that the social worker has not come to see him and help narrow down SNF selection.  Assessment/Plan: Probable left brain embolic stroke, likely MCA territory -CT head- small hypodensity in left occipital lobe, MRI/MRA head- ? Stenosis within anterior genus of right ICA  -Carotid Doppler with bilateral 1-39% ICA stenosis, hemoglobin A1c 5.3, LDL 82 -Seen by speech currently on dysphagia 1 diet. Per neurology who had seen the patient switched from aspirin to new her anticoagulation agent once able to swallow will place on xaralto. Continue statin for secondary prevention.  -Continue PT OT, will require placement. [facility] was his SNF.     Previous history of stroke -Treatment as in above   Atrial fibrillation [place] VAsc 2 - score 6 -Coreg and now initiated on xaralto  Hypertension -Continue Coreg at lower dose  3.125 mg BID   Hyperlipidemia -LDL 82, -Started on Lipitor 20 mg daily   Chronic Systolic heart failure Appears euvolemic, EF 25%, on Coreg, monitor intake output and daily weights. No ACE/ARB due to renal failure.   Tobacco abuse Educatedand counseled   Alcohol abuse Has been counseled. No signs of DTs.   Acute Renal Failure -Improved and at baseline now after hydration.  Baseline renal function close to 1.7.      Code Status: DNR Family Communication: no family present at time of exam Disposition Plan: SNF Consultants: Dr. Micki Riley. Neurology, stroke team Dr.[facility]     Procedure/Significant Events: CT head:- Small subcentimeter focus of hypodensity Lt occipital lobe, may reflect new site of ischemia versus  -Encephalomalacia in the left frontal lobe. -Chronic small vessel ischemic change and remote lacunar infarct in the right caudate. 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?Marland Kitchen    Culture None  Antibiotics: None  DVT prophylaxis: Xaralto   Devices None  LINES / TUBES:  NOne   Continuous Infusions:    Objective:  Filed Vitals:   05/27/14 2359 05/28/14 0418 05/28/14 0753 05/28/14 0755  BP: 161/99 122/98 154/88   Pulse: 65 72  75  Temp: 97.8 F (36.6 C) 97.7 F (36.5 C)    TempSrc: Oral Oral    Resp: 16 22    Height:      Weight:      SpO2: 96% 92%  97%     Intake/Output Summary (Last 24 hours) at 05/28/14 1100 Last data filed at 05/28/14 0846  Gross per 24 hour  Intake 2743.33 ml  Output   1150 ml  Net 1593.33 ml     Exam: General: A/O 4, NAD, mild expressive aphasia, dysarthria . W/ right facial weekness Lungs: Clear to auscultation bilaterally without wheezes or crackles. Cardiovascular: Regular rate and rhythm without murmur gallop or rub normal S1 and S2 Abdomen: Nontender, obese abdomen, soft, bowel sounds positive, no rebound, no ascites, no appreciable  mass Extremities: Mild weak RUE grip and strength.  Neurologic; cranial nerve II through XII intact, tongue/uvula midline, right facial droop, right-sided weakness extremity strength 4/5, finger nose finger within normal limits on left, pass pointing on right, unable to perform quick finger touch right hand. Patient has a shuffling gait requiring 2 person assist. Continued expressive  aphasia, dysarthria, but can be understood.  Data Reviewed: Basic Metabolic Panel:  Recent Labs Lab 05/24/14 0510 05/24/14 0518 05/26/14 0254 05/27/14 0312 05/28/14 0635  NA 138 139 136 137 137  K 4.1 4.1 4.0 4.2 3.8  CL 105 104 102 106 105  CO2 27  --  24 21 23   GLUCOSE 103* 99 86 92 88  BUN 18 19 14 18 15   CREATININE 2.04* 2.00* 1.79* 1.74* 1.70*  CALCIUM 8.9  --  9.1 8.9 8.8   Liver Function Tests:  Recent Labs Lab 05/24/14 0510 05/27/14 0312 05/28/14 0635  AST 20 21 32  ALT 12 13 19   ALKPHOS 62 57 57  BILITOT 0.6 1.3* 1.1  PROT 6.9 6.2 6.0  ALBUMIN 3.3* 3.0* 2.9*   No results for input(s): LIPASE, AMYLASE in the last 168 hours. No results for input(s): AMMONIA in the last 168 hours. CBC:  Recent Labs Lab 05/24/14 0510 05/24/14 0518 05/26/14 0254 05/27/14 0312 05/28/14 0635  WBC 9.0  --  7.4 7.4 6.7  NEUTROABS 6.2  --   --  3.3 3.2  HGB 14.3 16.3 14.9 14.4 14.0  HCT 41.9 48.0 44.0 42.0 41.1  MCV 99.5  --  98.4 98.1 98.1  PLT 128*  --  129* 125* 131*   Cardiac Enzymes: No results for input(s): CKTOTAL, CKMB, CKMBINDEX, TROPONINI in the last 168 hours. BNP (last 3 results) No results for input(s): BNP in the last 8760 hours.  ProBNP (last 3 results) No results for input(s): PROBNP in the last 8760 hours.  CBG: No results for input(s): GLUCAP in the last 168 hours.  Recent Results (from the past 240 hour(s))  MRSA PCR Screening     Status: None   Collection Time: 05/24/14  1:00 PM  Result Value Ref Range Status   MRSA by PCR NEGATIVE NEGATIVE Final    Comment:        The GeneXpert MRSA Assay (FDA approved for NASAL specimens only), is one component of a comprehensive MRSA colonization surveillance program. It is not intended to diagnose MRSA infection nor to guide or monitor treatment for MRSA infections.         Scheduled Meds:  Scheduled Meds: .  stroke: mapping our early stages of recovery book   Does not apply Once  .  antiseptic oral rinse  7 mL Mouth Rinse BID  . atorvastatin  20 mg Oral q1800  . carvedilol  3.125 mg Oral BID WC  . heparin  5,000 Units Subcutaneous 3 times per day  . nicotine  21 mg Transdermal Daily  . scopolamine  1 patch Transdermal Q72H    Time spent on care of this patient: 40 mins  Brian Mclaughlin,PRASHANT K M.D on 05/28/2014 at 11:00 AM  Between 7am to 7pm - Pager - [phone], After 7pm go to [email or web address] - password [email or web address]  [facility]  Office  [phone]

## 2014-05-29 MED ORDER — RESOURCE THICKENUP CLEAR PO POWD: ORAL | Status: DC

## 2014-05-29 MED ORDER — RIVAROXABAN 20 MG PO TABS: 20.0000 mg | ORAL_TABLET | Freq: Every day | ORAL | Status: DC

## 2014-05-29 MED ORDER — CARVEDILOL 3.125 MG PO TABS: 3.1250 mg | ORAL_TABLET | Freq: Two times a day (BID) | ORAL | Status: DC

## 2014-05-29 MED ORDER — LEVALBUTEROL HCL 0.63 MG/3ML IN NEBU: 0.6300 mg | INHALATION_SOLUTION | Freq: Four times a day (QID) | RESPIRATORY_TRACT | Status: DC | PRN

## 2014-05-29 MED ORDER — HYDRALAZINE HCL 50 MG PO TABS
50.0000 mg | ORAL_TABLET | Freq: Three times a day (TID) | ORAL | Status: DC
  Administered 2014-05-29: 50 mg via ORAL
  Filled 2014-05-29 (×3): qty 1

## 2014-05-29 MED ORDER — NICOTINE 21 MG/24HR TD PT24: 21.0000 mg | MEDICATED_PATCH | Freq: Every day | TRANSDERMAL | Status: DC

## 2014-05-29 MED ORDER — HYDRALAZINE HCL 50 MG PO TABS
50.0000 mg | ORAL_TABLET | Freq: Three times a day (TID) | ORAL | Status: DC
Start: 2014-05-29 — End: 2018-03-12

## 2014-05-29 MED ORDER — ATORVASTATIN CALCIUM 20 MG PO TABS: 20.0000 mg | ORAL_TABLET | Freq: Every day | ORAL | Status: DC

## 2014-05-29 NOTE — Progress Notes (Signed)
Patient was discharged to nursing home [facility] by MD order; discharged instructions  review and sent to facility via EMS; IV DIC; skin intact; patient will be transported to nursing home via EMS; facility was called and report was given to nurse Roe Coombson; patient will be transported to nursing home via EMS.

## 2014-05-29 NOTE — Progress Notes (Signed)
Medicare Important Message given?  YES (If response is "NO", the following Medicare IM given date fields will be blank) Date Medicare IM given:05/29/14 Medicare IM given by:  Laurella Tull 

## 2014-05-29 NOTE — Progress Notes (Signed)
Speech Language Pathology Treatment: Dysphagia;Cognitive-Linquistic  Patient Details Name: Brian Mclaughlin MRN: [id number] DOB: [birth year] Today's Date: 05/29/2014 Time: 0950-1006 SLP Time Calculation (min) (ACUTE ONLY): 16 min  Assessment / Plan / Recommendation Clinical Impression  Treatment focused on both dysphagia and dysarthria goals. Patient initially resistive to pos trials stating that all food tasted "terrible." With encouragement, patient agreeable to pureed solids and clinician mixed (and demonstrated mixing of) nectar thick liquids. Able to consume without overt s/s of aspiration, min cues for use of safe swallowing strategies, primarily with liquids (small sips). Moderate-max verbal cueing provided throughout session for use of speech intelligibility strategies, with focus on utilization of increased vocal intensity which is successful to improve intelligibility from 50% to 80% at the phrase level. Patient resistive to use of strategies, distracted by use of computer, cell phone. Will continue to benefit from SLP f/u. Overall, current diet remains appropriate.    HPI HPI: Pt is a 69 y.o. right handed male with a history of hypertension, previous stroke, alcohol and tobacco abuse, CHF, atrial fibrillation and hyperlipidemia, presenting with new onset right facial droop and slurred speech on 05-24-14. Pt with history of medicine noncompliance. CT scan of his head showed no acute intracranial hemorrhage. A subcentimeter focus of hypodensity was noted in the left occipital lobe, possibly a small acute stroke. MRI of the brain shows infarct of left internal capsule,  right frontal parietal region involvement, and  infarct of right basal ganglia.  There was encephalomalacia in the left frontal lobe from previous stroke. NIH stroke score was 4. TPA not administered secondary to delay in arrival. Pt was independent prior to admission and living alone.    Pertinent Vitals Pain Assessment: No/denies pain   SLP Plan  Continue with current plan of care    Recommendations Diet recommendations: Dysphagia 1 (puree);Nectar-thick liquid Liquids provided via: Cup;No straw Medication Administration: Crushed with puree Supervision: Patient able to self feed;Full supervision/cueing for compensatory strategies Compensations: Slow rate;Small sips/bites Postural Changes and/or Swallow Maneuvers: Seated upright 90 degrees              Oral Care Recommendations: Oral care BID Follow up Recommendations: Inpatient Rehab Plan: Continue with current plan of care    GO   [facility] Annais Crafts MA, CCC-SLP [phone]   Araiya Tilmon Meryl 05/29/2014, 10:10 AM

## 2014-05-29 NOTE — Discharge Instructions (Signed)
Follow with Primary MD or SNF MD in 7 days   Get CBC, CMP, 2 view Chest X ray checked  by Primary MD or SNF MD next visit.    Activity: As tolerated with Full fall precautions use walker/cane & assistance as needed   Disposition SNF/CIR   Diet: Dysphagia 1 pured thick liquids with feeding assistance and aspiration precautions.  For Heart failure patients - Check your Weight same time everyday, if you gain over 2 pounds, or you develop in leg swelling, experience more shortness of breath or chest pain, call your Primary MD immediately. Follow Cardiac Low Salt Diet and 1.5 lit/day fluid restriction.   On your next visit with your primary care physician please Get Medicines reviewed and adjusted.   Please request your Prim.MD to go over all Hospital Tests and Procedure/Radiological results at the follow up, please get all Hospital records sent to your Prim MD by signing hospital release before you go home.   If you experience worsening of your admission symptoms, develop shortness of breath, life threatening emergency, suicidal or homicidal thoughts you must seek medical attention immediately by calling 911 or calling your MD immediately  if symptoms less severe.  You Must read complete instructions/literature along with all the possible adverse reactions/side effects for all the Medicines you take and that have been prescribed to you. Take any new Medicines after you have completely understood and accpet all the possible adverse reactions/side effects.   Do not drive, operating heavy machinery, perform activities at heights, swimming or participation in water activities or provide baby sitting services if your were admitted for syncope or siezures until you have seen by Primary MD or a Neurologist and advised to do so again.  Do not drive when taking Pain medications.    Do not take more than prescribed Pain, Sleep and Anxiety Medications  Special Instructions: If you have smoked or  chewed Tobacco  in the last 2 yrs please stop smoking, stop any regular Alcohol  and or any Recreational drug use.  Wear Seat belts while driving.   Please note  You were cared for by a hospitalist during your hospital stay. If you have any questions about your discharge medications or the care you received while you were in the hospital after you are discharged, you can call the unit and asked to speak with the hospitalist on call if the hospitalist that took care of you is not available. Once you are discharged, your primary care physician will handle any further medical issues. Please note that NO REFILLS for any discharge medications will be authorized once you are discharged, as it is imperative that you return to your primary care physician (or establish a relationship with a primary care physician if you do not have one) for your aftercare needs so that they can reassess your need for medications and monitor your lab values.

## 2014-05-29 NOTE — Discharge Summary (Addendum)
Brian Mclaughlin, is a 69 y.o. male  DOB [birth year]  MRN [id number].  Admission date:  05/24/2014  Admitting Physician  Stephani Police, PA-C  Discharge Date:  05/29/2014   Primary MD  No PCP Per Patient  Recommendations for primary care physician for things to follow:   Monitor secondary to his factors for stroke. Outpatient follow-up with neurology and cardiology in 1-2 weeks   Admission Diagnosis  Cough [R05] Chronic atrial fibrillation [I48.2] Acute ischemic stroke [I63.50] Noncompliance with medications [Z91.14]   Discharge Diagnosis  Cough [R05] Chronic atrial fibrillation [I48.2] Acute ischemic stroke [I63.50] Noncompliance with medications [Z91.14]    Principal Problem:   Acute ischemic stroke Active Problems:   HTN (hypertension)   Alcohol abuse   Tobacco abuse   Acute renal failure   Atrial fibrillation   Acute systolic CHF (congestive heart failure)   CVA (cerebral infarction)   Chronic systolic CHF (congestive heart failure)   Chronic atrial fibrillation   Stroke, embolic   Noncompliance with medications      Past Medical History  Diagnosis Date  . Hypertension   . Tobacco abuse   . ETOH abuse   . Hyperlipidemia   . Chronic systolic CHF (congestive heart failure)   . Alcohol abuse   . Tobacco use disorder   . Atrial fibrillation   . Acute kidney failure, unspecified   . Rhabdomyolysis   . Altered mental status   . Nonspecific elevation of levels of transaminase or lactic acid dehydrogenase (LDH)   . Acute, but ill-defined, cerebrovascular disease   . CVA (cerebral infarction)     Past Surgical History  Procedure Laterality Date  . Eye muscle surgery      as a teenager "tighten his muscles"  . Back surgery  10 years ago    "slipped disc" repair       History of present  illness and  Hospital Course:     Kindly see H&P for history of present illness and admission details, please review complete Labs, Consult reports and Test reports for all details in brief  HPI  from the history and physical done on the day of admission  69 year old WM PMHx hypertension, previous stroke, alcohol abuse, atrial fibrillation, alcohol abuse, and hyperlipidemia, and presented with new onset right facial droop and slurred speech. Patient has not been compliant with his medications including his Xarelto and blood pressure medication. In ED, CT head with no acute hemorrhage, with hypodensity focus in the left occipital lobe. Patient was not given TPA due to delayed arrival. 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 Stenosis within anterior genus of right ICA  -Carotid Doppler with bilateral 1-39% ICA stenosis, hemoglobin A1c 5.3, LDL 82 -Seen by speech currently on dysphagia 1 diet. Per neurology who had seen the patient switched from aspirin to NOAC once able to swallow, therefore he was  switched on xaralto. Continue statin for secondary prevention.  -Continue PT OT and speech therapy, will require placement. CIR VS SNF.     Previous history of stroke -Treatment as in above   Atrial fibrillation [place] VAsc 2 - score 6 -Coreg and now initiated on xaralto   Hypertension -Continue Coreg at lower dose 3.125 mg BID   Hyperlipidemia -LDL 82, -Started on Lipitor 20 mg daily   Chronic Systolic heart failure Appears euvolemic, EF of 50-55% and appears to have improved from previous EF of 25%, on Coreg, monitor intake output and daily weights. No ACE/ARB due to renal failure. Outpatient follow-up with cardiology. He is euvolemic and compensated monitor weight BMP and diuretic need as needed.   Tobacco abuse Educatedand  counseled , continue on nicotine patch   Alcohol abuse Has been counseled. No signs of DTs.   Acute Renal Failure on chronic kidney disease stage IV -Improved and at baseline now after hydration. Baseline renal function close to 1.7.   Discharge Condition: Stable   Follow UP  Follow-up Information    Follow up with [facility]. Schedule an appointment as soon as possible for a visit in 2 weeks.   Why:  CVA   Contact information:   [address] [place] [address] [phone]      Follow up with Brian Bihari, MD. Schedule an appointment as soon as possible for a visit in 1 week.   Specialty:  Cardiology   Why:  Afib   Contact information:   [address] [place] [address] [phone]       Follow up with [facility]    . Schedule an appointment as soon as possible for a visit in 1 week.   Contact information:   [address] [place] [address] [phone]        Discharge Instructions  and  Discharge Medications        Discharge Instructions    Discharge instructions    Complete by:  As directed   Follow with Primary MD or SNF MD in 7 days   Get CBC, CMP, 2 view Chest X ray checked  by Primary MD or SNF MD next visit.    Activity: As tolerated with Full fall precautions use walker/cane & assistance as needed   Disposition SNF/CIR   Diet: Dysphagia 1 pured thick liquids with feeding assistance and aspiration precautions.  For Heart failure patients - Check your Weight same time everyday, if you gain over 2 pounds, or you develop in leg swelling, experience more shortness of breath or chest pain, call your Primary MD immediately. Follow Cardiac Low Salt Diet and 1.5 lit/day fluid restriction.   On your next visit with your primary care physician please Get Medicines reviewed and adjusted.   Please request your Prim.MD to go over  all Hospital Tests and Procedure/Radiological results at the follow up, please get all Hospital records sent to your Prim MD by signing hospital release before you go home.   If you experience worsening of your admission symptoms, develop shortness of breath, life threatening emergency, suicidal or homicidal thoughts you must seek medical attention immediately by calling 911 or calling your MD immediately  if symptoms less severe.  You Must read complete instructions/literature along with all the possible adverse reactions/side effects for all the Medicines you take and that have been prescribed to you. Take any new Medicines after you have completely  understood and accpet all the possible adverse reactions/side effects.   Do not drive, operating heavy machinery, perform activities at heights, swimming or participation in water activities or provide baby sitting services if your were admitted for syncope or siezures until you have seen by Primary MD or a Neurologist and advised to do so again.  Do not drive when taking Pain medications.    Do not take more than prescribed Pain, Sleep and Anxiety Medications  Special Instructions: If you have smoked or chewed Tobacco  in the last 2 yrs please stop smoking, stop any regular Alcohol  and or any Recreational drug use.  Wear Seat belts while driving.   Please note  You were cared for by a hospitalist during your hospital stay. If you have any questions about your discharge medications or the care you received while you were in the hospital after you are discharged, you can call the unit and asked to speak with the hospitalist on call if the hospitalist that took care of you is not available. Once you are discharged, your primary care physician will handle any further medical issues. Please note that NO REFILLS for any discharge medications will be authorized once you are discharged, as it is imperative that you return to your primary care physician  (or establish a relationship with a primary care physician if you do not have one) for your aftercare needs so that they can reassess your need for medications and monitor your lab values.     Increase activity slowly    Complete by:  As directed             Medication List    STOP taking these medications        acetaminophen 500 MG tablet  Commonly known as:  TYLENOL     predniSONE 20 MG tablet  Commonly known as:  DELTASONE      TAKE these medications        atorvastatin 20 MG tablet  Commonly known as:  LIPITOR  Take 1 tablet (20 mg total) by mouth daily at 6 PM.     carvedilol 3.125 MG tablet  Commonly known as:  COREG  Take 1 tablet (3.125 mg total) by mouth 2 (two) times daily with a meal.     hydrALAZINE 50 MG tablet  Commonly known as:  APRESOLINE  Take 1 tablet (50 mg total) by mouth every 8 (eight) hours.     levalbuterol 0.63 MG/3ML nebulizer solution  Commonly known as:  XOPENEX  Take 3 mLs (0.63 mg total) by nebulization every 6 (six) hours as needed for wheezing or shortness of breath.     nicotine 21 mg/24hr patch  Commonly known as:  NICODERM CQ - dosed in mg/24 hours  Place 1 patch (21 mg total) onto the skin daily.     ofloxacin 0.3 % otic solution  Commonly known as:  FLOXIN  Place 5 drops into the right ear daily.     RESOURCE THICKENUP CLEAR Powd  Used to thicken liquids to pured thick     rivaroxaban 20 MG Tabs tablet  Commonly known as:  XARELTO  Take 1 tablet (20 mg total) by mouth daily with supper.          Diet and Activity recommendation: See Discharge Instructions above   Consults obtained - Neuro, Cards   Major procedures and Radiology Reports - PLEASE review detailed and final reports for all details, in brief -    Carotids  -  The vertebral arteries appear patent with antegrade flow. - Findings consistent with 1-39 percent stenosis involving theright internal carotid artery and the left internal carotid  artery.    TTE  - Left ventricle: Poor acoustical windows limits adequate assessment of EF or regional wall motion abnormalities. Recommend repeat limited study with definity contrast. The cavity size was normal. There was moderate focal basal and mild concentric hypertrophy. Systolic function was normal. The estimated ejection fraction was in the range of 50% to 55%. Images were inadequate for LV wall motion assessment. - Aorta: Aortic root dimension: 40 mm (ED). - Ascending aorta: The ascending aorta was mildly dilated. - Mitral valve: There was mild regurgitation    Ct Head Wo Contrast  05/24/2014   CLINICAL DATA:  Code stroke. Right facial droop, right arm weakness, slurred speech.  EXAM: CT HEAD WITHOUT CONTRAST  TECHNIQUE: Contiguous axial images were obtained from the base of the skull through the vertex without intravenous contrast.  COMPARISON:  02/02/2012  FINDINGS: No intracranial hemorrhage, mass effect, or midline shift. No hydrocephalus. The basilar cisterns are patent. Small subcentimeter focus of hypodensity in the left occipital lobe, may reflect new site of ischemia versus volume averaging. Encephalomalacia in the left frontal lobe, unchanged from prior exam. Moderate chronic small vessel ischemic change. Unchanged lacunar infarct in the right caudate. No intracranial fluid collection. Calvarium is intact. Chronic opacification of scattered ethmoid air cells and mucosal thickening left frontal sinus. Scattered opacification of lower mastoid air cells, improved on the right.  IMPRESSION: 1. Small subcentimeter focus of hypodensity in the left occipital lobe, may reflect new site of ischemia versus volume averaging. 2. Encephalomalacia in the left frontal lobe. Chronic small vessel ischemic change and remote lacunar infarct in the right caudate.   Electronically Signed   By: Rubye Oaks M.D.   On: 05/24/2014 05:31   Mr Brain Wo Contrast  05/24/2014   CLINICAL DATA:   Initial evaluation for acute onset right facial droop with slurred speech.  EXAM: MRI HEAD WITHOUT CONTRAST  MRA HEAD WITHOUT CONTRAST  TECHNIQUE: Multiplanar, multiecho pulse sequences of the brain and surrounding structures were obtained without intravenous contrast. Angiographic images of the head were obtained using MRA technique without contrast.  COMPARISON:  None.  FINDINGS: MRI HEAD FINDINGS  Diffuse prominence of the CSF containing spaces is compatible with generalized cerebral atrophy. Patchy and confluent T2/FLAIR hyperintensity within the periventricular and deep white matter both cerebral hemispheres most consistent with chronic small vessel ischemic changes.  Remote lacunar infarcts involving the periventricular white matter of the right corona radiata as well as the right pons present.  There is an abnormal focus of restricted diffusion involving the posterior limb of the left internal capsule, measuring approximately in 0.9 x 1.0 x 1.9 cm, compatible with an acute ischemic infarct. No associated hemorrhage or mass effect. Delete that  There are additional smaller infarcts involving the deep white matter of the right frontoparietal region (series 7, image 12). Additional tiny cortical infarct within the high left parietal lobe (series 7, image 11).  No mass lesion or midline shift. Ventricular prominence related to global parenchymal volume loss present without hydrocephalus. No extra-axial fluid collection.  Craniocervical junction within normal limits. Prominent degenerative changes noted within the visualized upper cervical spine. Pituitary gland normal.  No acute abnormality seen about the orbits.  Minimal scattered opacity noted within the anterior ethmoidal air cells bilaterally. Paranasal sinuses are otherwise clear. Scattered fluid signal intensity present within the mastoid air  cells bilaterally, left greater than right.  Lumbar signal intensity is normal. Scalp soft tissues within normal  limits.  MRA HEAD FINDINGS  ANTERIOR CIRCULATION:  Study is degraded by motion artifact.  Visualized distal cervical segments of the internal carotid arteries are patent bilaterally with antegrade flow. The right internal carotid artery is diminutive as compared to the left. The petrous segments are patent without high-grade stenosis. There is question of focal high-grade stenosis within knee anterior genu of the cavernous right ICA versus motion artifact (series 5, image 69). Otherwise, the cavernous and supra clinoid segments are patent without definite high-grade stenosis. Right A1 segment is hypoplastic or absent, likely accounting for the size difference in the internal carotid arteries. The left A1 segment is widely patent. Anterior communicating artery and anterior cerebral arteries well opacified.  M1 segments widely patent bilaterally without focal stenosis or occlusion. MCA bifurcations within normal limits. Probable distal branch atherosclerotic irregularity present within the MCA branches bilaterally.  POSTERIOR CIRCULATION:  Left vertebral artery is dominant. Vertebral arteries are widely patent to the vertebrobasilar junction. Posterior inferior cerebral arteries grossly patent proximally. Basilar artery is mildly tortuous but widely patent without focal stenosis or occlusion. Superior cerebellar arteries are patent proximally. P1 and P2 segments are patent bilaterally. There is distal branch atheromatous irregularity within the posterior cerebral arteries bilaterally.  No aneurysm or vascular malformation.  IMPRESSION: MRI HEAD IMPRESSION:  1. Acute ischemic infarct involving the posterior limb of the left internal capsule as above. No associated hemorrhage or mass effect. 2. Additional smaller ischemic infarcts involving the deep white matter of the right frontal parietal region, with additional tiny cortical infarct within the high left parietal lobe. Given the various vascular distributions,  central thromboembolic etiology could be considered. 3. Remote lacunar infarcts involving the right basal ganglia/ corona radiata and right pons. 4. Generalized cerebral atrophy with moderate chronic microvascular ischemic disease.  MRA HEAD IMPRESSION:  1. Motion degraded study.  No proximal branch occlusion identified. 2. Question focal high-grade stenosis within the anterior genu of the cavernous right ICA. This finding may in part be related to extensive motion artifact through this region on this exam. 3. Hypoplastic/absent right A1 segment, with the anterior cerebral artery supplied via the left internal carotid artery. As a result com on the right ICA is asymmetrically small as compared to the left. 4. Distal branch atherosclerotic irregularity within the MCA and PCA branches bilaterally.   Electronically Signed   By: Rise MuBenjamin  McClintock M.D.   On: 05/24/2014 22:53   Dg Chest Port 1 View  05/24/2014   CLINICAL DATA:  Cough  EXAM: PORTABLE CHEST - 1 VIEW  COMPARISON:  02/02/2012  FINDINGS: Cardiac shadow is enlarged. The lungs are well aerated bilaterally. No focal infiltrate or sizable effusion is seen. No bony abnormality is noted.  IMPRESSION: No active disease.   Electronically Signed   By: Alcide CleverMark  Lukens M.D.   On: 05/24/2014 13:54   Dg Swallowing Func-speech Pathology  05/26/2014    Objective Swallowing Evaluation:    Patient Details  Name: Brian Mclaughlin MRN: [id number] Date of Birth: 1945/08/10  Today's Date: 05/26/2014 Time: SLP Start Time (ACUTE ONLY): 1045-SLP Stop Time (ACUTE ONLY): 1110 SLP Time Calculation (min) (ACUTE ONLY): 25 min  Past Medical History:  Past Medical History  Diagnosis Date  . Hypertension   . Tobacco abuse   . ETOH abuse   . Hyperlipidemia   . Chronic systolic CHF (congestive heart failure)   . Alcohol abuse   .  Tobacco use disorder   . Atrial fibrillation   . Acute kidney failure, unspecified   . Rhabdomyolysis   . Altered mental status   . Nonspecific elevation of levels of  transaminase or lactic acid  dehydrogenase (LDH)   . Acute, but ill-defined, cerebrovascular disease   . CVA (cerebral infarction)    Past Surgical History:  Past Surgical History  Procedure Laterality Date  . Eye muscle surgery      as a teenager "tighten his muscles"  . Back surgery  10 years ago    "slipped disc" repair   HPI:  HPI: Pt is a 69 y.o. right handed male with a history of hypertension,  previous stroke, alcohol and tobacco abuse, CHF, atrial fibrillation and  hyperlipidemia, presenting with new onset right facial droop and slurred  speech on 05-24-14. Pt with history of medicine noncompliance. CT scan of  his head showed no acute intracranial hemorrhage. A subcentimeter focus of  hypodensity was noted in the left occipital lobe, possibly a small acute  stroke. MRI of the brain shows infarct of left internal capsule,  right  frontal parietal region involvement, and  infarct of right basal ganglia.   There was encephalomalacia in the left frontal lobe from previous stroke.  NIH stroke score was 4. TPA not administered secondary to delay in  arrival. Pt was independent prior to admission and living alone. Pt  currently noted to have severe dysarthria affecting expreesive  communication intelligibility  No Data Recorded  Assessment / Plan / Recommendation CHL IP CLINICAL IMPRESSIONS 05/26/2014  Dysphagia Diagnosis Moderate oral phase dysphagia;Mild pharyngeal phase  dysphagia  Clinical impression Pt presents with a moderate oropharyngeal dysphagia  marked primarily by significant delays in initiation, as well as  sensorimotor impairments.  Oral phase was delayed with piecemeal bolusing.   There was pre-deglutitive filling of the hypophayrnx, with materials  remaining there as long as a minute before a swallow response was  triggered.  High penetration of nectar-thick liquids noted on fewer than  10% of boluses.  Trace aspiration (silent) of thin liquids observed just  below the level of the vocal folds.   Chin tuck was effective in  eliminating aspiration, but use of chin tuck exacerbated swallow delays  significantly.  Recommend initiating a dysphagia 1 diet with nectar-thick  liquids; meds crushed in puree.  Pt will require 1:1 supervision with  meals to ensure safety.  He will require frequent verbal/tactile cues to  initiate and follow-through with swallow response.  SLP will follow for  therapeutic exercise and diet toleration.       CHL IP TREATMENT RECOMMENDATION 05/26/2014  Treatment Plan Recommendations Therapy as outlined in treatment plan below      CHL IP DIET RECOMMENDATION 05/26/2014  Diet Recommendations Dysphagia 1 (Puree);Nectar-thick liquid  Liquid Administration via Cup  Medication Administration Crushed with puree  Compensations Slow rate;Small sips/bites  Postural Changes and/or Swallow Maneuvers Seated upright 90 degrees     CHL IP OTHER RECOMMENDATIONS 05/26/2014  Recommended Consults (None)  Oral Care Recommendations Oral care BID  Other Recommendations Order thickener from pharmacy     CHL IP FOLLOW UP RECOMMENDATIONS 05/25/2014  Follow up Recommendations Inpatient Rehab     CHL IP FREQUENCY AND DURATION 05/26/2014  Speech Therapy Frequency (ACUTE ONLY) min 3x week  Treatment Duration (None)         SLP Swallow Goals No flowsheet data found.  No flowsheet data found.    CHL IP REASON FOR REFERRAL  05/26/2014  Reason for Referral Objectively evaluate swallowing function     CHL IP ORAL PHASE 05/26/2014  Lips (None)  Tongue (None)  Mucous membranes (None)  Nutritional status (None)  Other (None)  Oxygen therapy (None)  Oral Phase Impaired  Oral - Pudding Teaspoon (None)  Oral - Pudding Cup (None)  Oral - Honey Teaspoon (None)  Oral - Honey Cup (None)  Oral - Honey Syringe (None)  Oral - Nectar Teaspoon NT  Oral - Nectar Cup Right pocketing in lateral sulci;Piecemeal  swallowing;Right anterior bolus loss;Holding of bolus  Oral - Nectar Straw (None)  Oral - Nectar Syringe (None)  Oral - Ice Chips (None)   Oral - Thin Teaspoon (None)  Oral - Thin Cup Right pocketing in lateral sulci;Piecemeal  swallowing;Right anterior bolus loss  Oral - Thin Straw (None)  Oral - Thin Syringe (None)  Oral - Puree Right pocketing in lateral sulci;Piecemeal swallowing;Right  anterior bolus loss;Holding of bolus;Delayed oral transit  Oral - Mechanical Soft Right pocketing in lateral sulci;Piecemeal  swallowing;Right anterior bolus loss;Holding of bolus;Delayed oral transit   Oral - Regular (None)  Oral - Multi-consistency (None)  Oral - Pill (None)  Oral Phase - Comment (None)      CHL IP PHARYNGEAL PHASE 05/26/2014  Pharyngeal Phase Impaired  Pharyngeal - Pudding Teaspoon (None)  Penetration/Aspiration details (pudding teaspoon) (None)  Pharyngeal - Pudding Cup (None)  Penetration/Aspiration details (pudding cup) (None)  Pharyngeal - Honey Teaspoon (None)  Penetration/Aspiration details (honey teaspoon) (None)  Pharyngeal - Honey Cup (None)  Penetration/Aspiration details (honey cup) (None)  Pharyngeal - Honey Syringe (None)  Penetration/Aspiration details (honey syringe) (None)  Pharyngeal - Nectar Teaspoon Delayed swallow initiation;Premature spillage  to pyriform sinuses;Reduced airway/laryngeal  closure;Penetration/Aspiration during swallow;Pharyngeal residue -  valleculae  Penetration/Aspiration details (nectar teaspoon) Material enters airway,  remains ABOVE vocal cords then ejected out  Pharyngeal - Nectar Cup Delayed swallow initiation;Premature spillage to  pyriform sinuses;Reduced airway/laryngeal closure;Penetration/Aspiration  during swallow;Pharyngeal residue - valleculae  Penetration/Aspiration details (nectar cup) Material enters airway,  remains ABOVE vocal cords then ejected out  Pharyngeal - Nectar Straw (None)  Penetration/Aspiration details (nectar straw) (None)  Pharyngeal - Nectar Syringe (None)  Penetration/Aspiration details (nectar syringe) (None)  Pharyngeal - Ice Chips (None)  Penetration/Aspiration details  (ice chips) (None)  Pharyngeal - Thin Teaspoon (None)  Penetration/Aspiration details (thin teaspoon) (None)  Pharyngeal - Thin Cup Delayed swallow initiation;Premature spillage to  pyriform sinuses;Reduced airway/laryngeal closure;Penetration/Aspiration  during swallow;Pharyngeal residue - valleculae;Trace aspiration  Penetration/Aspiration details (thin cup) Material enters airway, CONTACTS  cords and not ejected out  Pharyngeal - Thin Straw (None)  Penetration/Aspiration details (thin straw) (None)  Pharyngeal - Thin Syringe (None)  Penetration/Aspiration details (thin syringe') (None)  Pharyngeal - Puree Delayed swallow initiation;Pharyngeal residue -  valleculae;Premature spillage to valleculae;Reduced tongue base retraction   Penetration/Aspiration details (puree) (None)  Pharyngeal - Mechanical Soft Delayed swallow initiation;Pharyngeal residue  - valleculae;Premature spillage to valleculae;Reduced tongue base  retraction  Penetration/Aspiration details (mechanical soft) (None)  Pharyngeal - Regular (None)  Penetration/Aspiration details (regular) (None)  Pharyngeal - Multi-consistency (None)  Penetration/Aspiration details (multi-consistency) (None)  Pharyngeal - Pill (None)  Penetration/Aspiration details (pill) (None)  Pharyngeal Comment (None)     No flowsheet data found.  No flowsheet data found.         [name] [name] 05/26/2014, 4:57 PM    Mr [name] Head/brain Wo Cm  05/24/2014   CLINICAL DATA:  Initial evaluation for acute onset right facial droop with slurred  speech.  EXAM: MRI HEAD WITHOUT CONTRAST  MRA HEAD WITHOUT CONTRAST  TECHNIQUE: Multiplanar, multiecho pulse sequences of the brain and surrounding structures were obtained without intravenous contrast. Angiographic images of the head were obtained using MRA technique without contrast.  COMPARISON:  None.  FINDINGS: MRI HEAD FINDINGS  Diffuse prominence of the CSF containing spaces is compatible with generalized cerebral atrophy. Patchy  and confluent T2/FLAIR hyperintensity within the periventricular and deep white matter both cerebral hemispheres most consistent with chronic small vessel ischemic changes.  Remote lacunar infarcts involving the periventricular white matter of the right corona radiata as well as the right pons present.  There is an abnormal focus of restricted diffusion involving the posterior limb of the left internal capsule, measuring approximately in 0.9 x 1.0 x 1.9 cm, compatible with an acute ischemic infarct. No associated hemorrhage or mass effect. Delete that  There are additional smaller infarcts involving the deep white matter of the right frontoparietal region (series 7, image 12). Additional tiny cortical infarct within the high left parietal lobe (series 7, image 11).  No mass lesion or midline shift. Ventricular prominence related to global parenchymal volume loss present without hydrocephalus. No extra-axial fluid collection.  Craniocervical junction within normal limits. Prominent degenerative changes noted within the visualized upper cervical spine. Pituitary gland normal.  No acute abnormality seen about the orbits.  Minimal scattered opacity noted within the anterior ethmoidal air cells bilaterally. Paranasal sinuses are otherwise clear. Scattered fluid signal intensity present within the mastoid air cells bilaterally, left greater than right.  Lumbar signal intensity is normal. Scalp soft tissues within normal limits.  MRA HEAD FINDINGS  ANTERIOR CIRCULATION:  Study is degraded by motion artifact.  Visualized distal cervical segments of the internal carotid arteries are patent bilaterally with antegrade flow. The right internal carotid artery is diminutive as compared to the left. The petrous segments are patent without high-grade stenosis. There is question of focal high-grade stenosis within knee anterior genu of the cavernous right ICA versus motion artifact (series 5, image 69). Otherwise, the cavernous and  supra clinoid segments are patent without definite high-grade stenosis. Right A1 segment is hypoplastic or absent, likely accounting for the size difference in the internal carotid arteries. The left A1 segment is widely patent. Anterior communicating artery and anterior cerebral arteries well opacified.  M1 segments widely patent bilaterally without focal stenosis or occlusion. MCA bifurcations within normal limits. Probable distal branch atherosclerotic irregularity present within the MCA branches bilaterally.  POSTERIOR CIRCULATION:  Left vertebral artery is dominant. Vertebral arteries are widely patent to the vertebrobasilar junction. Posterior inferior cerebral arteries grossly patent proximally. Basilar artery is mildly tortuous but widely patent without focal stenosis or occlusion. Superior cerebellar arteries are patent proximally. P1 and P2 segments are patent bilaterally. There is distal branch atheromatous irregularity within the posterior cerebral arteries bilaterally.  No aneurysm or vascular malformation.  IMPRESSION: MRI HEAD IMPRESSION:  1. Acute ischemic infarct involving the posterior limb of the left internal capsule as above. No associated hemorrhage or mass effect. 2. Additional smaller ischemic infarcts involving the deep white matter of the right frontal parietal region, with additional tiny cortical infarct within the high left parietal lobe. Given the various vascular distributions, central thromboembolic etiology could be considered. 3. Remote lacunar infarcts involving the right basal ganglia/ corona radiata and right pons. 4. Generalized cerebral atrophy with moderate chronic microvascular ischemic disease.  MRA HEAD IMPRESSION:  1. Motion degraded study.  No proximal branch occlusion identified. 2. Question  focal high-grade stenosis within the anterior genu of the cavernous right ICA. This finding may in part be related to extensive motion artifact through this region on this exam. 3.  Hypoplastic/absent right A1 segment, with the anterior cerebral artery supplied via the left internal carotid artery. As a result com on the right ICA is asymmetrically small as compared to the left. 4. Distal branch atherosclerotic irregularity within the MCA and PCA branches bilaterally.   Electronically Signed   By: Rise Mu M.D.   On: 05/24/2014 22:53    Micro Results      Recent Results (from the past 240 hour(s))  MRSA PCR Screening     Status: None   Collection Time: 05/24/14  1:00 PM  Result Value Ref Range Status   MRSA by PCR NEGATIVE NEGATIVE Final    Comment:        The GeneXpert MRSA Assay (FDA approved for NASAL specimens only), is one component of a comprehensive MRSA colonization surveillance program. It is not intended to diagnose MRSA infection nor to guide or monitor treatment for MRSA infections.        Today   Subjective:   Drue Second today has no headache,no chest abdominal pain,no new weakness tingling or numbness, feels much better.  Objective:   Blood pressure 168/100, pulse 50, temperature 98.4 F (36.9 C), temperature source Oral, resp. rate 16, height 6' (1.829 m), weight 109 kg (240 lb 4.8 oz), SpO2 93 %.   Intake/Output Summary (Last 24 hours) at 05/29/14 0947 Last data filed at 05/29/14 0929  Gross per 24 hour  Intake    260 ml  Output   1725 ml  Net  -1465 ml    Exam Awake Alert, Oriented x 3, right-sided hemiparesis strength 3 over 5, facial droop, dysarthria .AT,PERRAL Supple Neck,No JVD, No cervical lymphadenopathy appriciated.  Symmetrical Chest wall movement, Good air movement bilaterally, CTAB RRR,No Gallops,Rubs or new Murmurs, No Parasternal Heave +ve B.Sounds, Abd Soft, Non tender, No organomegaly appriciated, No rebound -guarding or rigidity. No Cyanosis, Clubbing or edema, No new Rash or bruise  Data Review   CBC w Diff:  Lab Results  Component Value Date   WBC 6.7 05/28/2014   HGB 14.0 05/28/2014     HCT 41.1 05/28/2014   PLT 131* 05/28/2014   LYMPHOPCT 36 05/28/2014   MONOPCT 11 05/28/2014   EOSPCT 3 05/28/2014   BASOPCT 1 05/28/2014    CMP:  Lab Results  Component Value Date   NA 137 05/28/2014   K 3.8 05/28/2014   CL 105 05/28/2014   CO2 23 05/28/2014   BUN 15 05/28/2014   CREATININE 1.70* 05/28/2014   PROT 6.0 05/28/2014   ALBUMIN 2.9* 05/28/2014   BILITOT 1.1 05/28/2014   ALKPHOS 57 05/28/2014   AST 32 05/28/2014   ALT 19 05/28/2014  . Lab Results  Component Value Date   HGBA1C 5.6 05/25/2014    Lab Results  Component Value Date   CHOL 158 05/25/2014   HDL 51 05/25/2014   LDLCALC 82 05/25/2014   TRIG 125 05/25/2014   CHOLHDL 3.1 05/25/2014     Total Time in preparing paper work, data evaluation and todays exam - 35 minutes  Leroy Sea M.D on 05/29/2014 at 9:47 AM  [facility]   Office  [phone]

## 2014-05-29 NOTE — Clinical Social Work Placement (Signed)
Clinical Social Work Department CLINICAL SOCIAL WORK PLACEMENT NOTE 05/29/2014  Patient:  Brian Mclaughlin,Brian C  Account Number:  [id number] Admit date:  05/24/2014  Clinical Social Worker:  Gwynne EdingerYSHEKA BIBBS, LCSWA  Date/time:  05/26/2014 09:59 AM  Clinical Social Work is seeking post-discharge placement for this patient at the following level of care:   SKILLED NURSING   (*CSW will update this form in Epic as items are completed)   05/26/2014  [facility]family provided with [facility] of Clinical Social Work's list of facilities offering this level of care within the geographic area requested by the patient (or if unable, by the patient's family).  05/26/2014  Patient/family informed of their freedom to choose among providers that offer the needed level of care, that participate in Medicare, Medicaid or managed care program needed by the patient, have an available bed and are willing to accept the patient.  05/26/2014  Patient/family informed of [facility]' ownership interest in [facility], as well as of the fact that they are under no obligation to receive care at this facility.  PASARR submitted to EDS on  PASARR number received on   FL2 transmitted to all facilities in geographic area requested by pt/family on  05/26/2014 FL2 transmitted to all facilities within larger geographic area on 05/26/2014  Patient informed that his/her managed care company has contracts with or will negotiate with  certain facilities, including the following:     Patient/family informed of bed offers received:  05/28/2014 [facility] Physician recommends and patient chooses bed at    [facility] on  05/29/2014 Patient to be transferred to facility by Ambulance Patient and family notified of transfer on 05/29/2014 Name of family member notified:  Helmut MusterAlicia (by phone), Misty StanleyStacey (by voicemail)  The  following physician request were entered in Epic:   Additional Comments:   Per MD patient ready for DC to [facility] ([phone]). RN, patient, patient's family, and facility notified of DC. RN given number for report. DC packet on chart. [facility] transport requested for patient for 2:30PM. CSW signing off.    Roddie McBryant Robina Hamor MSW, [place]ipley[place]CSWA, [place], [phone]

## 2014-06-14 ENCOUNTER — Encounter: Payer: Self-pay | Admitting: Neurology

## 2014-06-14 ENCOUNTER — Ambulatory Visit (INDEPENDENT_AMBULATORY_CARE_PROVIDER_SITE_OTHER): Payer: Medicare Other | Admitting: Neurology

## 2014-06-14 VITALS — BP 115/80 | HR 68 | Ht 72.0 in | Wt 226.0 lb

## 2014-06-14 DIAGNOSIS — I482 Chronic atrial fibrillation, unspecified: Secondary | ICD-10-CM

## 2014-06-14 DIAGNOSIS — R269 Unspecified abnormalities of gait and mobility: Secondary | ICD-10-CM | POA: Diagnosis not present

## 2014-06-14 DIAGNOSIS — I639 Cerebral infarction, unspecified: Secondary | ICD-10-CM

## 2014-06-14 HISTORY — DX: Unspecified abnormalities of gait and mobility: R26.9

## 2014-06-14 NOTE — Patient Instructions (Signed)
Stroke Prevention Some medical conditions and behaviors are associated with an increased chance of having a stroke. You may prevent a stroke by making healthy choices and managing medical conditions. HOW CAN I REDUCE MY RISK OF HAVING A STROKE?   Stay physically active. Get at least 30 minutes of activity on most or all days.  Do not smoke. It may also be helpful to avoid exposure to secondhand smoke.  Limit alcohol use. Moderate alcohol use is considered to be:  No more than 2 drinks per day for men.  No more than 1 drink per day for nonpregnant women.  Eat healthy foods. This involves:  Eating 5 or more servings of fruits and vegetables a day.  Making dietary changes that address high blood pressure (hypertension), high cholesterol, diabetes, or obesity.  Manage your cholesterol levels.  Making food choices that are high in fiber and low in saturated fat, trans fat, and cholesterol may control cholesterol levels.  Take any prescribed medicines to control cholesterol as directed by your health care provider.  Manage your diabetes.  Controlling your carbohydrate and sugar intake is recommended to manage diabetes.  Take any prescribed medicines to control diabetes as directed by your health care provider.  Control your hypertension.  Making food choices that are low in salt (sodium), saturated fat, trans fat, and cholesterol is recommended to manage hypertension.  Take any prescribed medicines to control hypertension as directed by your health care provider.  Maintain a healthy weight.  Reducing calorie intake and making food choices that are low in sodium, saturated fat, trans fat, and cholesterol are recommended to manage weight.  Stop drug abuse.  Avoid taking birth control pills.  Talk to your health care provider about the risks of taking birth control pills if you are over 35 years old, smoke, get migraines, or have ever had a blood clot.  Get evaluated for sleep  disorders (sleep apnea).  Talk to your health care provider about getting a sleep evaluation if you snore a lot or have excessive sleepiness.  Take medicines only as directed by your health care provider.  For some people, aspirin or blood thinners (anticoagulants) are helpful in reducing the risk of forming abnormal blood clots that can lead to stroke. If you have the irregular heart rhythm of atrial fibrillation, you should be on a blood thinner unless there is a good reason you cannot take them.  Understand all your medicine instructions.  Make sure that other conditions (such as anemia or atherosclerosis) are addressed. SEEK IMMEDIATE MEDICAL CARE IF:   You have sudden weakness or numbness of the face, arm, or leg, especially on one side of the body.  Your face or eyelid droops to one side.  You have sudden confusion.  You have trouble speaking (aphasia) or understanding.  You have sudden trouble seeing in one or both eyes.  You have sudden trouble walking.  You have dizziness.  You have a loss of balance or coordination.  You have a sudden, severe headache with no known cause.  You have new chest pain or an irregular heartbeat. Any of these symptoms may represent a serious problem that is an emergency. Do not wait to see if the symptoms will go away. Get medical help at once. Call your local emergency services (911 in [place] Do not drive yourself to the hospital. Document Released: 04/10/2004 Document Revised: 07/18/2013 Document Reviewed: 09/03/2012 ExitCare Patient Information 2015 [employer]. This information is not intended to replace advice given   to you by your health care provider. Make sure you discuss any questions you have with your health care provider.  

## 2014-06-14 NOTE — Progress Notes (Signed)
Reason for visit: Stroke  Referring physician: [facility]  EDON HOADLEY is a 69 y.o. male  History of present illness:  Mr. Cwikla is a 69 year old right-handed white male with a history of chronic atrial fibrillation. The patient was not on anticoagulant therapy prior to admission. The patient was admitted to [facility] with onset of right-sided weakness, right facial droop, slurred speech that began on the ninth of March, 2016. The patient underwent MRI evaluation that showed a left internal capsular stroke, with smaller strokes in the right parietal area. The events were felt to be embolic in nature. The patient underwent a 2-D echocardiogram and a carotid Doppler study that were relatively unremarkable. The patient was placed on Xarelto during that hospital stay, and he was discharged eventually to an extended care facility, [facility]. The patient indicates that he has been getting physical, occupational, and speech therapy. He is making some gains, he is now able to walk with a walker, up to 50 yards. The patient has not had any falls in the extended care facility. The patient requires some assistance with bathing and dressing. He has been told that he is to be discharged from the extended care facility in 4 days, and he feels that he is not ready to leave. The patient lives alone, he reports that he does not have any support system in this area to help him out. He denies any issues with loss of vision, double vision, or difficulty controlling the bowels or the bladder. He has been swallowing some better, he has required a modified diet.  Past Medical History  Diagnosis Date  . Hypertension   . Tobacco abuse   . ETOH abuse   . Hyperlipidemia   . Chronic systolic CHF (congestive heart failure)   . Alcohol abuse   . Tobacco use disorder   . Atrial fibrillation   . Acute kidney failure, unspecified   . Rhabdomyolysis   . Altered mental status   . Nonspecific elevation of  levels of transaminase or lactic acid dehydrogenase (LDH)   . Acute, but ill-defined, cerebrovascular disease   . CVA (cerebral infarction)   . Abnormality of gait 06/14/2014    Past Surgical History  Procedure Laterality Date  . Eye muscle surgery      as a teenager "tighten his muscles"  . Back surgery  10 years ago    "slipped disc" repair    Family History  Problem Relation Age of Onset  . Cancer Mother   . Cancer Father   . Multiple sclerosis Daughter     Social history:  reports that he has quit smoking. His smoking use included Cigarettes. He has a 22.5 pack-year smoking history. He does not have any smokeless tobacco history on file. He reports that he drinks alcohol. He reports that he does not use illicit drugs.  Medications:  Prior to Admission medications   Medication Sig Start Date End Date Taking? Authorizing Provider  atorvastatin (LIPITOR) 20 MG tablet Take 1 tablet (20 mg total) by mouth daily at 6 PM. 05/29/14  Yes Leroy Sea, MD  b complex vitamins tablet Take 1 tablet by mouth daily.   Yes Historical Provider, MD  carvedilol (COREG) 3.125 MG tablet Take 1 tablet (3.125 mg total) by mouth 2 (two) times daily with a meal. 05/29/14  Yes Leroy Sea, MD  ergocalciferol (VITAMIN D2) 50000 UNITS capsule Take 50,000 Units by mouth once a week.   Yes Historical Provider, MD  folic acid (FOLVITE) 1 MG tablet Take 1 mg by mouth daily.   Yes Historical Provider, MD  hydrALAZINE (APRESOLINE) 50 MG tablet Take 1 tablet (50 mg total) by mouth every 8 (eight) hours. 05/29/14  Yes Leroy SeaPrashant K Singh, MD  levalbuterol (XOPENEX) 0.63 MG/3ML nebulizer solution Take 3 mLs (0.63 mg total) by nebulization every 6 (six) hours as needed for wheezing or shortness of breath. 05/29/14  Yes Leroy SeaPrashant K Singh, MD  Maltodextrin-Xanthan Gum (RESOURCE THICKENUP CLEAR) POWD Used to thicken liquids to pured thick 05/29/14  Yes Leroy SeaPrashant K Singh, MD  nicotine (NICODERM CQ - DOSED IN MG/24 HOURS)  21 mg/24hr patch Place 1 patch (21 mg total) onto the skin daily. 05/29/14  Yes Leroy SeaPrashant K Singh, MD  ofloxacin (FLOXIN) 0.3 % otic solution Place 5 drops into the right ear daily. 12/13/12  Yes Peyton Najjaravid H Hopper, MD  rivaroxaban (XARELTO) 20 MG TABS tablet Take 1 tablet (20 mg total) by mouth daily with supper. 05/29/14  Yes Leroy SeaPrashant K Singh, MD      Allergies  Allergen Reactions  . Codeine Rash  . Penicillins Rash    ROS:  Out of a complete 14 system review of symptoms, the patient complains only of the following symptoms, and all other reviewed systems are negative.  Difficulty swallowing Blurred vision Weakness of the right arm Difficulty swallowing Depression, decreased energy  Blood pressure 115/80, pulse 68, height 6' (1.829 m), weight 226 lb (102.513 kg).  Physical Exam  General: The patient is alert and cooperative at the time of the examination.  Eyes: Pupils are equal, round, and reactive to light. Discs are flat bilaterally.  Neck: The neck is supple, no carotid bruits are noted.  Respiratory: The respiratory examination is clear.  Cardiovascular: The cardiovascular examination reveals a regular rate and rhythm, no obvious murmurs or rubs are noted.  Skin: Extremities are without significant edema.  Neurologic Exam  Mental status: The patient is alert and oriented x 3 at the time of the examination. The patient has apparent normal recent and remote memory, with an apparently normal attention span and concentration ability.  Cranial nerves: Facial symmetry is not present. There is depression of the right nasolabial fold. There is good sensation of the face to pinprick and soft touch bilaterally. The strength of the facial muscles and the muscles to head turning and shoulder shrug are normal bilaterally. Speech is dysphonic, not aphasic. Extraocular movements are full. Visual fields are full. The tongue is midline, and the patient has symmetric elevation of the soft  palate. No obvious hearing deficits are noted.  Motor: The motor testing reveals 5 over 5 strength of all 4 extremities. Good symmetric motor tone is noted throughout.  Sensory: Sensory testing is intact to pinprick, soft touch, vibration sensation, and position sense on all 4 extremities, with exception that there is decreased position sense of the right foot. No evidence of extinction is noted.  Coordination: Cerebellar testing reveals good finger-nose-finger and heel-to-shin bilaterally.  Gait and station: Gait is associated with a wide-based, unsteady gait. There is a slight circumduction gait with the right leg. The patient is able to walk with minimal assistance. The patient has a tendency to lean backwards. Romberg is negative. Tandem gait was not attempted.  Reflexes: Deep tendon reflexes are symmetric and normal bilaterally. Toes are downgoing bilaterally.   MRI/MRA head 05/24/14:  IMPRESSION: MRI HEAD IMPRESSION:  1. Acute ischemic infarct involving the posterior limb of the left internal capsule as above. No  associated hemorrhage or mass effect. 2. Additional smaller ischemic infarcts involving the deep white matter of the right frontal parietal region, with additional tiny cortical infarct within the high left parietal lobe. Given the various vascular distributions, central thromboembolic etiology could be considered. 3. Remote lacunar infarcts involving the right basal ganglia/ corona radiata and right pons. 4. Generalized cerebral atrophy with moderate chronic microvascular ischemic disease.  MRA HEAD IMPRESSION:  1. Motion degraded study. No proximal branch occlusion identified. 2. Question focal high-grade stenosis within the anterior genu of the cavernous right ICA. This finding may in part be related to extensive motion artifact through this region on this exam. 3. Hypoplastic/absent right A1 segment, with the anterior cerebral artery supplied via the left  internal carotid artery. As a result com on the right ICA is asymmetrically small as compared to the left. 4. Distal branch atherosclerotic irregularity within the MCA and PCA branches bilaterally.  * MRI scan images were reviewed online. I agree with the written report.   Carotid doppler study 05/25/14:  Summary:  - The vertebral arteries appear patent with antegrade flow. - Findings consistent with 1-39 percent stenosis involving the right internal carotid artery and the left internal carotid artery.   2D echo 05/24/14:  Study Conclusions  - Left ventricle: Poor acoustical windows limits adequate assessment of EF or regional wall motion abnormalities. Recommend repeat limited study with definity contrast. The cavity size was normal. There was moderate focal basal and mild concentric hypertrophy. Systolic function was normal. The estimated ejection fraction was in the range of 50% to 55%. Images were inadequate for LV wall motion assessment. - Aorta: Aortic root dimension: 40 mm (ED). - Ascending aorta: The ascending aorta was mildly dilated. - Mitral valve: There was mild regurgitation.    Assessment/Plan:  1. Atrial fibrillation  2. Left brain stroke, embolic, right hemiparesis  3. Gait disorder  4. Speech and swallowing disorder  The patient is to remain on Xarelto at this time. He indicates that he will be discharged from the extended care facility in 4 days. I will agree with him that he is probably not ready to be discharged to home, particularly if he is to live alone without a good support system around him. The patient has significant gait instability, he may be at risk for falls, and he requires some assistance with bathing and dressing. He will require ongoing physical, occupational, and speech therapy. He needs to get established with a primary care physician. I will see him back in about 6 months.  Marlan Palau MD 06/14/2014 7:56  PM  [facility] [address] [place], [place] [address]  Phone [phone] Fax [phone]

## 2014-06-29 ENCOUNTER — Ambulatory Visit: Payer: Medicare Other | Admitting: Cardiology

## 2014-12-15 ENCOUNTER — Ambulatory Visit: Payer: Medicare Other | Admitting: Neurology

## 2018-03-09 ENCOUNTER — Other Ambulatory Visit: Payer: Self-pay

## 2018-03-09 ENCOUNTER — Observation Stay (HOSPITAL_COMMUNITY)
Admission: EM | Admit: 2018-03-09 | Discharge: 2018-03-12 | Disposition: A | Discharge status: Home / Self Care | Payer: Medicare HMO | Attending: Internal Medicine | Admitting: Internal Medicine

## 2018-03-09 ENCOUNTER — Encounter (HOSPITAL_COMMUNITY): Payer: Self-pay

## 2018-03-09 DIAGNOSIS — R509 Fever, unspecified: Secondary | ICD-10-CM | POA: Diagnosis not present

## 2018-03-09 DIAGNOSIS — R531 Weakness: Secondary | ICD-10-CM | POA: Diagnosis not present

## 2018-03-09 DIAGNOSIS — I4891 Unspecified atrial fibrillation: Secondary | ICD-10-CM | POA: Diagnosis not present

## 2018-03-09 DIAGNOSIS — Z79899 Other long term (current) drug therapy: Secondary | ICD-10-CM | POA: Diagnosis not present

## 2018-03-09 DIAGNOSIS — N183 Chronic kidney disease, stage 3 (moderate): Secondary | ICD-10-CM | POA: Diagnosis not present

## 2018-03-09 DIAGNOSIS — R0902 Hypoxemia: Secondary | ICD-10-CM | POA: Diagnosis not present

## 2018-03-09 DIAGNOSIS — Z9114 Patient's other noncompliance with medication regimen: Secondary | ICD-10-CM

## 2018-03-09 DIAGNOSIS — R197 Diarrhea, unspecified: Secondary | ICD-10-CM | POA: Insufficient documentation

## 2018-03-09 DIAGNOSIS — I482 Chronic atrial fibrillation, unspecified: Secondary | ICD-10-CM | POA: Insufficient documentation

## 2018-03-09 DIAGNOSIS — N1832 Chronic kidney disease, stage 3b: Secondary | ICD-10-CM | POA: Diagnosis present

## 2018-03-09 DIAGNOSIS — I129 Hypertensive chronic kidney disease with stage 1 through stage 4 chronic kidney disease, or unspecified chronic kidney disease: Secondary | ICD-10-CM | POA: Diagnosis not present

## 2018-03-09 DIAGNOSIS — R2689 Other abnormalities of gait and mobility: Secondary | ICD-10-CM | POA: Diagnosis not present

## 2018-03-09 DIAGNOSIS — Z8673 Personal history of transient ischemic attack (TIA), and cerebral infarction without residual deficits: Secondary | ICD-10-CM | POA: Diagnosis not present

## 2018-03-09 DIAGNOSIS — N179 Acute kidney failure, unspecified: Secondary | ICD-10-CM | POA: Diagnosis not present

## 2018-03-09 DIAGNOSIS — F1010 Alcohol abuse, uncomplicated: Secondary | ICD-10-CM | POA: Diagnosis present

## 2018-03-09 DIAGNOSIS — Z87891 Personal history of nicotine dependence: Secondary | ICD-10-CM | POA: Insufficient documentation

## 2018-03-09 DIAGNOSIS — I10 Essential (primary) hypertension: Secondary | ICD-10-CM | POA: Diagnosis present

## 2018-03-09 DIAGNOSIS — R110 Nausea: Secondary | ICD-10-CM | POA: Diagnosis not present

## 2018-03-09 DIAGNOSIS — Z7901 Long term (current) use of anticoagulants: Secondary | ICD-10-CM | POA: Diagnosis not present

## 2018-03-09 LAB — CBG MONITORING, ED: Glucose-Capillary: 90 mg/dL (ref 70–99)

## 2018-03-09 NOTE — ED Provider Notes (Signed)
[name]   CSN: [id number] Arrival date & time: 03/09/18  2258     History   Chief Complaint Chief Complaint  Patient presents with  . Weakness    HPI Brian SerLee C Mittleman is a 72 y.o. male.  The history is provided by the patient and the EMS personnel.  Weakness  Primary symptoms include focal weakness, dizziness.  Primary symptoms include no loss of sensation, no speech change, no visual change. This is a recurrent problem. The current episode started less than 1 hour ago. The problem has not changed since onset.There was right facial, right upper extremity and right lower extremity focality noted. The maximum temperature recorded prior to his arrival was 101 to 101.9 F. The fever has been present for less than 1 day. Pertinent negatives include no shortness of breath, no chest pain, no vomiting, no altered mental status, no confusion and no headaches. Associated medical issues include CVA.    Past Medical History:  Diagnosis Date  . Abnormality of gait 06/14/2014  . Acute kidney failure, unspecified (HCC)   . Acute, but ill-defined, cerebrovascular disease   . Alcohol abuse   . Altered mental status   . Atrial fibrillation (HCC)   . Chronic systolic CHF (congestive heart failure) (HCC)   . CVA (cerebral infarction)   . ETOH abuse   . Hyperlipidemia   . Hypertension   . Nonspecific elevation of levels of transaminase or lactic acid dehydrogenase (LDH)   . Rhabdomyolysis   . Tobacco abuse   . Tobacco use disorder     Patient Active Problem List   Diagnosis Date Noted  . Abnormality of gait 06/14/2014  . Chronic atrial fibrillation   . Stroke, embolic (HCC)   . Noncompliance with medications   . Acute CVA (cerebrovascular accident) (HCC) 05/24/2014  . Acute ischemic stroke (HCC) 05/24/2014  . CVA (cerebral infarction) 05/24/2014  . Chronic systolic CHF (congestive heart failure) (HCC)   . Acute systolic CHF  (congestive heart failure) (HCC) 08/25/2011  . Stroke (HCC) 08/23/2011  . HTN (hypertension) 08/23/2011  . Alcohol abuse 08/23/2011  . Tobacco abuse 08/23/2011  . LFTs abnormal 08/23/2011  . Acute renal failure (HCC) 08/23/2011  . Rhabdomyolysis 08/23/2011  . Atrial fibrillation (HCC) 08/23/2011    Past Surgical History:  Procedure Laterality Date  . BACK SURGERY  10 years ago   "slipped disc" repair  . EYE MUSCLE SURGERY     as a teenager "tighten his muscles"        Home Medications    Prior to Admission medications   Medication Sig Start Date End Date Taking? Authorizing Provider  atorvastatin (LIPITOR) 20 MG tablet Take 1 tablet (20 mg total) by mouth daily at 6 PM. 05/29/14   Leroy SeaSingh, Prashant K, MD  b complex vitamins tablet Take 1 tablet by mouth daily.    [provider]  carvedilol (COREG) 3.125 MG tablet Take 1 tablet (3.125 mg total) by mouth 2 (two) times daily with a meal. 05/29/14   Leroy SeaSingh, Prashant K, MD  ergocalciferol (VITAMIN D2) 50000 UNITS capsule Take 50,000 Units by mouth once a week.    [provider]  folic acid (FOLVITE) 1 MG tablet Take 1 mg by mouth daily.    [provider]  hydrALAZINE (APRESOLINE) 50 MG tablet Take 1 tablet (50 mg total) by mouth every 8 (eight) hours. 05/29/14   Leroy SeaSingh, Prashant K, MD  levalbuterol Pauline Aus(XOPENEX) 0.63 MG/3ML nebulizer solution Take  3 mLs (0.63 mg total) by nebulization every 6 (six) hours as needed for wheezing or shortness of breath. 05/29/14   Leroy Sea, MD  Maltodextrin-Xanthan Gum (RESOURCE THICKENUP CLEAR) POWD Used to thicken liquids to pured thick 05/29/14   Leroy Sea, MD  nicotine (NICODERM CQ - DOSED IN MG/24 HOURS) 21 mg/24hr patch Place 1 patch (21 mg total) onto the skin daily. 05/29/14   Leroy Sea, MD  ofloxacin (FLOXIN) 0.3 % otic solution Place 5 drops into the right ear daily. 12/13/12   Peyton Najjar, MD  rivaroxaban (XARELTO) 20 MG TABS tablet Take 1 tablet (20  mg total) by mouth daily with supper. 05/29/14   Leroy Sea, MD    Family History Family History  Problem Relation Age of Onset  . Cancer Mother   . Cancer Father   . Multiple sclerosis Daughter     Social History Social History   Tobacco Use  . Smoking status: Former Smoker    Packs/day: 0.50    Years: 45.00    Pack years: 22.50    Types: Cigarettes  Substance Use Topics  . Alcohol use: Yes    Comment: Drinks 6-8 beers daily  . Drug use: No    Comment: denies any drug usage     Allergies   Codeine and Penicillins   Review of Systems Review of Systems  Constitutional: Positive for fever (on arrival was discovered, no reportes symptoms). Negative for chills, diaphoresis and fatigue.  HENT: Negative for congestion.   Eyes: Negative for visual disturbance.  Respiratory: Negative for cough, chest tightness, shortness of breath, wheezing and stridor.   Cardiovascular: Negative for chest pain, palpitations and leg swelling.  Gastrointestinal: Negative for diarrhea, nausea and vomiting.  Genitourinary: Negative for dysuria, flank pain and frequency.  Musculoskeletal: Negative for back pain, neck pain and neck stiffness.  Skin: Negative for rash and wound.  Neurological: Positive for dizziness, focal weakness and weakness. Negative for speech change, seizures, syncope, light-headedness, numbness and headaches.  Psychiatric/Behavioral: Negative for agitation and confusion.  All other systems reviewed and are negative.    Physical Exam Updated Vital Signs BP 112/65 (BP Location: Right Arm)   Pulse 81   Temp (!) 101.3 F (38.5 C) (Axillary)   Resp 16   Ht 6' (1.829 m)   Wt 93 kg   SpO2 94%   BMI 27.80 kg/m   Physical Exam Vitals signs and nursing note reviewed.  Constitutional:      General: He is not in acute distress.    Appearance: He is well-developed. He is not ill-appearing, toxic-appearing or diaphoretic.  HENT:     Head: Normocephalic and  atraumatic.     Nose: No congestion or rhinorrhea.     Mouth/Throat:     Mouth: Mucous membranes are moist.     Pharynx: No posterior oropharyngeal erythema.  Eyes:     Conjunctiva/sclera: Conjunctivae normal.  Neck:     Musculoskeletal: Neck supple. No neck rigidity or muscular tenderness.  Cardiovascular:     Rate and Rhythm: Regular rhythm. Tachycardia present.     Heart sounds: No murmur.  Pulmonary:     Effort: Pulmonary effort is normal. No respiratory distress.     Breath sounds: Normal breath sounds. No wheezing, rhonchi or rales.  Chest:     Chest wall: No tenderness.  Abdominal:     General: There is no distension.     Palpations: Abdomen is soft.  Tenderness: There is no abdominal tenderness.  Musculoskeletal:        General: No swelling or tenderness.     Right lower leg: No edema.     Left lower leg: No edema.  Skin:    General: Skin is warm and dry.     Capillary Refill: Capillary refill takes less than 2 seconds.     Coloration: Skin is not jaundiced.  Neurological:     Mental Status: He is alert.     Sensory: No sensory deficit.     Motor: Weakness present. No tremor.     Comments: Right face, right arm, and right leg weakness compared to left.  Normal sensation.  Normal finger-nose-finger testing bilaterally.  Pupils symmetric and normal extraocular events.  Psychiatric:        Mood and Affect: Mood normal.      ED Treatments / Results  Labs (all labs ordered are listed, but only abnormal results are displayed) Labs Reviewed  CBC WITH DIFFERENTIAL/PLATELET - Abnormal; Notable for the following components:      Result Value   MCV 102.7 (*)    MCH 34.2 (*)    All other components within normal limits  COMPREHENSIVE METABOLIC PANEL - Abnormal; Notable for the following components:   Creatinine, Ser 2.21 (*)    GFR calc non Af Amer 29 (*)    GFR calc Af Amer 33 (*)    All other components within normal limits  I-STAT CG4 LACTIC ACID, ED -  Abnormal; Notable for the following components:   Lactic Acid, Venous 3.02 (*)    All other components within normal limits  URINE CULTURE  ETHANOL  PROTIME-INR  LIPASE, BLOOD  TSH  URINALYSIS, ROUTINE W REFLEX MICROSCOPIC  RAPID URINE DRUG SCREEN, HOSP PERFORMED  BRAIN NATRIURETIC PEPTIDE  CBG MONITORING, ED  I-STAT TROPONIN, ED    EKG EKG Interpretation  Date/Time:  Tuesday March 09 2018 23:06:14 EST Ventricular Rate:  85 PR Interval:    QRS Duration: 108 QT Interval:  464 QTC Calculation: 552 R Axis:   -5 Text Interpretation:  Atrial fibrillation Nonspecific repol abnormality, diffuse leads Prolonged QT interval When compared to prior, similar afib with some t wave inversion in leads V4-V6.  No STEMI Confirmed by Theda Belfastegeler, Chris ([id number]) on 03/09/2018 11:20:10 PM   Radiology Dg Chest 2 View  Result Date: 03/10/2018 CLINICAL DATA:  Confusion and fevers EXAM: CHEST - 2 VIEW COMPARISON:  05/24/2014 FINDINGS: Cardiac shadow is enlarged. Lungs are well aerated bilaterally. Minimal blunting of the left costophrenic angle is noted. No sizable pneumothorax is seen. IMPRESSION: Minimal blunting of left costophrenic angle. Electronically Signed   By: Alcide CleverMark  Lukens M.D.   On: 03/10/2018 00:18    Procedures Procedures (including critical care time)  Medications Ordered in ED Medications - No data to display   Initial Impression / Assessment and Plan / ED Course  I have reviewed the triage vital signs and the nursing notes.  Pertinent labs & imaging results that were available during my care of the patient were reviewed by me and considered in my medical decision making (see chart for details).     Brian Mclaughlin is a 72 y.o. male with a past medical history significant for hypertension, CHF, prior stroke, chronic atrial fibrillation not on anticoagulation, rhabdo, alcohol abuse, and medication noncompliance who presents with reportedly 1 hour of right-sided weakness.  On my  initial assessment, EMS and patient report that at 10 PM was his last  normal, 1 hour prior to arrival.  Patient reports that he has right face, right arm, and right leg weakness.  He reports he had similar symptoms last night between 10 PM and 2 AM but then completely resolved before starting again tonight.  He says this feels similar to prior stroke prompting him to seek evaluation.  He reports no recent fevers, chills, congestion, cough, nausea, vomiting, headache, vision changes, urinary symptoms or GI symptoms.  Glucose with EMS was normal.  Repeated on arrival was normal.  With his 1 hour of new symptoms, code stroke was called.  Neurology was at the bedside within minutes and decided to hold on full code stroke activation.  On neurology's exam, patient was saying that he "always has right-sided weakness" and says he was having more dizziness and lightheadedness today.  Story continues to evolve and he does report he is been drinking alcohol.  Patient was found to have a fever of 101.3 but denies other infectious symptoms.  No neck pain or neck stiffness.  On my exam, patient did have slight decreased grip strength on the right compared to left.  Slight weakness in right leg compared to left.  Normal finger-nose-finger testing bilaterally.  Pupils are reactive and normal extraocular weakness.  Slight right-sided facial droop.  EKG revealed similar atrial fibrillation to prior.  No STEMI.  Neurology recommended further metabolic work-up however they do recommend admission to medicine for further stroke evaluation.  He recommended MRI/MRA of the head.  Given the patient's known A. fib without anticoagulation he is a high risk for recurrent stroke.  He requests patient be admitted for further management.    Patient had work-up to look for infectious etiology given his fever.  Low suspicion for meningitis at this time based on lack of headache, neck pain, neck stiffness.  Hospitalist team called for  admission.  Patient will be admitted for further management of possible stroke rule out and work-up to look for infection.  Final Clinical Impressions(s) / ED Diagnoses   Final diagnoses:  Weakness    ED Discharge Orders    None      Clinical Impression: 1. Weakness     Disposition: Admit  This note was prepared with assistance of Dragon voice recognition software. Occasional wrong-word or sound-a-like substitutions may have occurred due to the inherent limitations of voice recognition software.     Tegeler, Canary Brim, MD 03/10/18 [phone]

## 2018-03-09 NOTE — ED Notes (Signed)
CBG:90 

## 2018-03-09 NOTE — Consult Note (Signed)
Referring Physician: Dr. Rush Landmarkegeler    Chief Complaint: Right sided weakness  HPI: Brian Mclaughlin is an 72 y.o. male with atrial fibrillation, noncompliant with anticoagulation, who presented to the ED overnight with acute onset of right face, arm and leg weakness, per EDP. On further interview, the patient clarified that he felt "weak all over", then changed his story to pre-existing right sided weakness that became acutely worse. On initial ED assessment he was awake and alert, denying headache. He had no meningeal signs and was answering questions appropriately. He felt as though he was having a stroke and felt weak and dizzy. MRI in the ED was negative for an acute stroke.   His PMHx also includes heavy alcohol abuse for about 10 years at 12 beers per day. He states that he quit 2 years ago.    Past Medical History:  Diagnosis Date  . Abnormality of gait 06/14/2014  . Acute kidney failure, unspecified   . Acute, but ill-defined, cerebrovascular disease   . Alcohol abuse   . Altered mental status   . Atrial fibrillation   . Chronic systolic CHF (congestive heart failure)   . CVA (cerebral infarction)   . ETOH abuse   . Hyperlipidemia   . Hypertension   . Nonspecific elevation of levels of transaminase or lactic acid dehydrogenase (LDH)   . Rhabdomyolysis   . Tobacco abuse   . Tobacco use disorder     Past Surgical History:  Procedure Laterality Date  . BACK SURGERY  10 years ago   "slipped disc" repair  . EYE MUSCLE SURGERY     as a teenager "tighten his muscles"    Family History  Problem Relation Age of Onset  . Cancer Mother   . Cancer Father   . Multiple sclerosis Daughter    Social History:  reports that he has quit smoking. His smoking use included cigarettes. He has a 22.50 pack-year smoking history. He does not have any smokeless tobacco history on file. He reports current alcohol use. He reports that he does not use drugs.  Allergies:  Allergies  Allergen Reactions   . Codeine Rash  . Penicillins Rash    Medications:  Prior to Admission:  Medications Prior to Admission  Medication Sig Dispense Refill Last Dose  . atorvastatin (LIPITOR) 20 MG tablet Take 1 tablet (20 mg total) by mouth daily at 6 PM. (Patient not taking: Reported on 03/09/2018)   Not Taking at Unknown time  . carvedilol (COREG) 3.125 MG tablet Take 1 tablet (3.125 mg total) by mouth 2 (two) times daily with a meal. (Patient not taking: Reported on 03/09/2018)   Not Taking at Unknown time  . hydrALAZINE (APRESOLINE) 50 MG tablet Take 1 tablet (50 mg total) by mouth every 8 (eight) hours. (Patient not taking: Reported on 03/09/2018)   Not Taking at Unknown time  . levalbuterol (XOPENEX) 0.63 MG/3ML nebulizer solution Take 3 mLs (0.63 mg total) by nebulization every 6 (six) hours as needed for wheezing or shortness of breath. (Patient not taking: Reported on 03/09/2018) 3 mL 12 Not Taking at Unknown time  . Maltodextrin-Xanthan Gum (RESOURCE THICKENUP CLEAR) POWD Used to thicken liquids to pured thick (Patient not taking: Reported on 03/09/2018)   Not Taking at Unknown time  . nicotine (NICODERM CQ - DOSED IN MG/24 HOURS) 21 mg/24hr patch Place 1 patch (21 mg total) onto the skin daily. (Patient not taking: Reported on 03/09/2018) 28 patch 0 Not Taking at Unknown time  .  ofloxacin (FLOXIN) 0.3 % otic solution Place 5 drops into the right ear daily. (Patient not taking: Reported on 03/09/2018) 5 mL 0 Not Taking at Unknown time  . rivaroxaban (XARELTO) 20 MG TABS tablet Take 1 tablet (20 mg total) by mouth daily with supper. (Patient not taking: Reported on 03/09/2018) 30 tablet  Not Taking at Unknown time    ROS: Denies headache, neck pain, CP, abdominal pain or vision changes. Other ROS as per HPI.   Physical Examination: Blood pressure 112/65, pulse 81, temperature (!) 101.3 F (38.5 C), temperature source Axillary, resp. rate 16, height 6' (1.829 m), weight 93 kg, SpO2 94 %.  HEENT:  [place]/AT. Decreased hydration of oral mucosa Lungs: Respirations unlabored Ext: Chronic pigmentary changes and pitting edema to distal lower extremities bilaterally.   Neurologic Examination: Mental Status: Alert, oriented to city, state and month, but not to day or year. Able to follow all motor commands. Naming and repetition intact. Has some difficulty with a 2-step directional command. Cranial Nerves: II:  Visual fields intact. PERRL.  III,IV, VI: Ptosis not present. EOMI without nystagmus.   V,VII: Subtle right facial droop. Facial temp sensation equal bilaterally VIII: hearing intact to voice IX,X: Hoarseness is noted XI: No asymmetry XII: Midline tongue extension  Motor: RUE: 4/5 grip and deltoid, OTW 5/5 RLE: 4+/5 proximal and distal LUE: 5/5 LLE: 5/5 except for weak ADF/APF Subtle right UE drift Tone and bulk:normal tone throughout; no atrophy noted Sensory: Temp intact all 4 extremities Deep Tendon Reflexes:  3+ bilateral biceps and brachioradialis 3+ patellae 0 achilles bilaterally  Plantars: Mute bilaterally  Cerebellar: No ataxia with FNF bilaterally  Gait: Deferred  Results for orders placed or performed during the hospital encounter of 03/09/18 (from the past 48 hour(s))  CBG monitoring, ED     Status: None   Collection Time: 03/09/18 11:02 PM  Result Value Ref Range   Glucose-Capillary 90 70 - 99 mg/dL   No results found.  Assessment: 72 y.o. male with atrial fibrillation, noncompliant with anticoagulation, presenting with a c/c of transient right sided weakness 1. Exam shows subtle RUE and RLE weakness as well as subtle weakness of right lower quadrant of face.  2. 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 Stroke Risk Factors - Atrial fibrillation, noncompliance with anticoagulation, CHF, prior stroke, HLD and HTN  Plan: 1. HgbA1c, fasting lipid panel 2. MRI, MRA of the brain without contrast 3. PT consult, OT consult, Speech  consult 4. Echocardiogram 5. Carotid dopplers 6. Prophylactic therapy- ASA 325 mg po for now. Stroke team to discuss with patient restarting anticoagulation and reasons for his noncompliance with such.  7. Risk factor modification 8. Telemetry monitoring 9. Frequent neuro checks 10. Permissive HTN x 24 hours 11. CIWA protocol 12. History of rhabdomyolysis. If he has been noncompliant with atorvastatin, consider not restarting this medication.     @Electronically  signed: Dr. Caryl PinaEric Sande Pickert@  03/09/2018, 11:15 PM

## 2018-03-09 NOTE — ED Triage Notes (Signed)
Pt BIB [facility] for eval of R sided weakness which he initially reported started more than 24 hours PTA. En route he then stated that it had entirely resolved and then RE started this evening at 2200 prompting him to call EMS. EMS reports they appreciated a R sided facial droop, R sided weakness- which pt reported to them was new. On arrival here, MD Tegeler to bedside to assess, considered calling code stroke. Neurology MD Otelia LimesLindzen in department and assessed pt. While neuro was assessing pt, pt reported that he actually had R sided weakness for years, and this was not new. Neuro opted not to activate code stroke. Pt is GCS 15, CAOx4.  Received 4 mg zofran by EMS for nausea.

## 2018-03-10 ENCOUNTER — Observation Stay (HOSPITAL_BASED_OUTPATIENT_CLINIC_OR_DEPARTMENT_OTHER): Payer: Medicare HMO

## 2018-03-10 ENCOUNTER — Emergency Department (HOSPITAL_COMMUNITY): Payer: Medicare HMO

## 2018-03-10 DIAGNOSIS — Z9114 Patient's other noncompliance with medication regimen: Secondary | ICD-10-CM

## 2018-03-10 DIAGNOSIS — I482 Chronic atrial fibrillation, unspecified: Secondary | ICD-10-CM

## 2018-03-10 DIAGNOSIS — I370 Nonrheumatic pulmonary valve stenosis: Secondary | ICD-10-CM | POA: Diagnosis not present

## 2018-03-10 DIAGNOSIS — I4891 Unspecified atrial fibrillation: Secondary | ICD-10-CM | POA: Diagnosis not present

## 2018-03-10 DIAGNOSIS — I361 Nonrheumatic tricuspid (valve) insufficiency: Secondary | ICD-10-CM | POA: Diagnosis not present

## 2018-03-10 DIAGNOSIS — I10 Essential (primary) hypertension: Secondary | ICD-10-CM

## 2018-03-10 DIAGNOSIS — F1010 Alcohol abuse, uncomplicated: Secondary | ICD-10-CM | POA: Diagnosis not present

## 2018-03-10 DIAGNOSIS — R509 Fever, unspecified: Secondary | ICD-10-CM | POA: Diagnosis not present

## 2018-03-10 DIAGNOSIS — G459 Transient cerebral ischemic attack, unspecified: Secondary | ICD-10-CM | POA: Diagnosis not present

## 2018-03-10 DIAGNOSIS — R531 Weakness: Secondary | ICD-10-CM

## 2018-03-10 DIAGNOSIS — N183 Chronic kidney disease, stage 3 (moderate): Secondary | ICD-10-CM

## 2018-03-10 DIAGNOSIS — N179 Acute kidney failure, unspecified: Secondary | ICD-10-CM

## 2018-03-10 DIAGNOSIS — R69 Illness, unspecified: Secondary | ICD-10-CM | POA: Diagnosis not present

## 2018-03-10 DIAGNOSIS — N1832 Chronic kidney disease, stage 3b: Secondary | ICD-10-CM | POA: Diagnosis present

## 2018-03-10 LAB — CBC WITH DIFFERENTIAL/PLATELET
BAND NEUTROPHILS: 0 %
BASOS ABS: 0.1 10*3/uL (ref 0.0–0.1)
BLASTS: 0 %
Basophils Relative: 1 %
Eosinophils Absolute: 0 10*3/uL (ref 0.0–0.5)
Eosinophils Relative: 0 %
HCT: 45.1 % (ref 39.0–52.0)
Hemoglobin: 15 g/dL (ref 13.0–17.0)
LYMPHS ABS: 0.2 10*3/uL — AB (ref 0.7–4.0)
Lymphocytes Relative: 3 %
MCH: 34.2 pg — ABNORMAL HIGH (ref 26.0–34.0)
MCHC: 33.3 g/dL (ref 30.0–36.0)
MCV: 102.7 fL — AB (ref 80.0–100.0)
METAMYELOCYTES PCT: 0 %
MYELOCYTES: 0 %
Monocytes Absolute: 0 10*3/uL — ABNORMAL LOW (ref 0.1–1.0)
Monocytes Relative: 0 %
Neutro Abs: 7.9 10*3/uL — ABNORMAL HIGH (ref 1.7–7.7)
Neutrophils Relative %: 96 %
Other: 0 %
Platelets: 99 10*3/uL — ABNORMAL LOW (ref 150–400)
Promyelocytes Relative: 0 %
RBC: 4.39 MIL/uL (ref 4.22–5.81)
RDW: 12.5 % (ref 11.5–15.5)
WBC MORPHOLOGY: INCREASED
WBC: 8.2 10*3/uL (ref 4.0–10.5)
nRBC: 0 % (ref 0.0–0.2)
nRBC: 0 /100 WBC

## 2018-03-10 LAB — I-STAT TROPONIN, ED
Troponin i, poc: 0.01 ng/mL (ref 0.00–0.08)
Troponin i, poc: 0.03 ng/mL (ref 0.00–0.08)

## 2018-03-10 LAB — URINALYSIS, ROUTINE W REFLEX MICROSCOPIC
Bilirubin Urine: NEGATIVE
GLUCOSE, UA: NEGATIVE mg/dL
KETONES UR: NEGATIVE mg/dL
NITRITE: POSITIVE — AB
PH: 5 (ref 5.0–8.0)
PROTEIN: NEGATIVE mg/dL
Specific Gravity, Urine: 1.016 (ref 1.005–1.030)

## 2018-03-10 LAB — INFLUENZA PANEL BY PCR (TYPE A & B)
INFLAPCR: NEGATIVE
Influenza B By PCR: NEGATIVE

## 2018-03-10 LAB — COMPREHENSIVE METABOLIC PANEL
ALT: 12 U/L (ref 0–44)
AST: 22 U/L (ref 15–41)
Albumin: 3.5 g/dL (ref 3.5–5.0)
Alkaline Phosphatase: 57 U/L (ref 38–126)
Anion gap: 9 (ref 5–15)
BUN: 22 mg/dL (ref 8–23)
CHLORIDE: 108 mmol/L (ref 98–111)
CO2: 22 mmol/L (ref 22–32)
Calcium: 9.1 mg/dL (ref 8.9–10.3)
Creatinine, Ser: 2.21 mg/dL — ABNORMAL HIGH (ref 0.61–1.24)
GFR calc Af Amer: 33 mL/min — ABNORMAL LOW (ref >60)
GFR, EST NON AFRICAN AMERICAN: 29 mL/min — AB (ref >60)
GLUCOSE: 94 mg/dL (ref 70–99)
POTASSIUM: 3.9 mmol/L (ref 3.5–5.1)
Sodium: 139 mmol/L (ref 135–145)
Total Bilirubin: 1.2 mg/dL (ref 0.3–1.2)
Total Protein: 7.1 g/dL (ref 6.5–8.1)

## 2018-03-10 LAB — ETHANOL: Alcohol, Ethyl (B): <10 mg/dL (ref <10)

## 2018-03-10 LAB — HEPARIN LEVEL (UNFRACTIONATED)
Heparin Unfractionated: 0.86 IU/mL — ABNORMAL HIGH (ref 0.30–0.70)
Heparin Unfractionated: 0.54 IU/mL (ref 0.30–0.70)

## 2018-03-10 LAB — PROTIME-INR
INR: 1.1
PROTHROMBIN TIME: 14.2 s (ref 11.4–15.2)

## 2018-03-10 LAB — RAPID URINE DRUG SCREEN, HOSP PERFORMED
Amphetamines: NOT DETECTED
Barbiturates: NOT DETECTED
Benzodiazepines: NOT DETECTED
Cocaine: NOT DETECTED
Opiates: NOT DETECTED
Tetrahydrocannabinol: NOT DETECTED

## 2018-03-10 LAB — ECHOCARDIOGRAM COMPLETE
Height: 72 in
Weight: 3583.8 oz

## 2018-03-10 LAB — I-STAT CG4 LACTIC ACID, ED
Lactic Acid, Venous: 1.84 mmol/L (ref 0.5–1.9)
Lactic Acid, Venous: 3.02 mmol/L (ref 0.5–1.9)

## 2018-03-10 LAB — GLUCOSE, CAPILLARY: Glucose-Capillary: 91 mg/dL (ref 70–99)

## 2018-03-10 LAB — LIPID PANEL
Cholesterol: 117 mg/dL (ref 0–200)
HDL: 51 mg/dL (ref >40)
LDL Cholesterol: 60 mg/dL (ref 0–99)
Total CHOL/HDL Ratio: 2.3 RATIO
Triglycerides: 30 mg/dL (ref <150)
VLDL: 6 mg/dL (ref 0–40)

## 2018-03-10 LAB — BRAIN NATRIURETIC PEPTIDE: B Natriuretic Peptide: 163 pg/mL — ABNORMAL HIGH (ref 0.0–100.0)

## 2018-03-10 LAB — T4, FREE: Free T4: 1.25 ng/dL (ref 0.82–1.77)

## 2018-03-10 LAB — LIPASE, BLOOD: LIPASE: 24 U/L (ref 11–51)

## 2018-03-10 LAB — TSH: TSH: 10.272 u[IU]/mL — ABNORMAL HIGH (ref 0.350–4.500)

## 2018-03-10 MED ORDER — LACTATED RINGERS IV BOLUS (SEPSIS): 1000.0000 mL | Freq: Once | INTRAVENOUS | Status: DC

## 2018-03-10 MED ORDER — ACETAMINOPHEN 650 MG RE SUPP: 650.0000 mg | RECTAL | Status: DC | PRN

## 2018-03-10 MED ORDER — SODIUM CHLORIDE 0.9 % IV SOLN: 2.0000 g | Freq: Once | INTRAVENOUS | Status: DC

## 2018-03-10 MED ORDER — ADULT MULTIVITAMIN W/MINERALS CH
1.0000 | ORAL_TABLET | Freq: Every day | ORAL | Status: DC
  Administered 2018-03-10 – 2018-03-12 (×3): 1 via ORAL
  Filled 2018-03-10 (×3): qty 1

## 2018-03-10 MED ORDER — ACETAMINOPHEN 325 MG PO TABS: 650.0000 mg | ORAL_TABLET | ORAL | Status: DC | PRN

## 2018-03-10 MED ORDER — ATORVASTATIN CALCIUM 10 MG PO TABS
20.0000 mg | ORAL_TABLET | Freq: Every day | ORAL | Status: DC
  Administered 2018-03-10 – 2018-03-11 (×2): 20 mg via ORAL
  Filled 2018-03-10 (×2): qty 2

## 2018-03-10 MED ORDER — SODIUM CHLORIDE 0.9 % IV SOLN
2.0000 g | Freq: Once | INTRAVENOUS | Status: AC
  Administered 2018-03-10: 2 g via INTRAVENOUS
  Filled 2018-03-10: qty 2

## 2018-03-10 MED ORDER — HEPARIN BOLUS VIA INFUSION
3000.0000 [IU] | Freq: Once | INTRAVENOUS | Status: AC
  Administered 2018-03-10: 3000 [IU] via INTRAVENOUS
  Filled 2018-03-10: qty 3000

## 2018-03-10 MED ORDER — ACETAMINOPHEN 160 MG/5ML PO SOLN: 650.0000 mg | ORAL | Status: DC | PRN

## 2018-03-10 MED ORDER — METRONIDAZOLE IN NACL 5-0.79 MG/ML-% IV SOLN
500.0000 mg | Freq: Three times a day (TID) | INTRAVENOUS | Status: DC
  Filled 2018-03-10: qty 100

## 2018-03-10 MED ORDER — HEPARIN (PORCINE) 25000 UT/250ML-% IV SOLN
1500.0000 [IU]/h | INTRAVENOUS | Status: DC
  Administered 2018-03-10: 1400 [IU]/h via INTRAVENOUS
  Administered 2018-03-10: 1200 [IU]/h via INTRAVENOUS
  Administered 2018-03-12: 1500 [IU]/h via INTRAVENOUS
  Filled 2018-03-10 (×4): qty 250

## 2018-03-10 MED ORDER — PERFLUTREN LIPID MICROSPHERE
1.0000 mL | INTRAVENOUS | Status: AC | PRN
  Administered 2018-03-10: 3 mL via INTRAVENOUS
  Filled 2018-03-10: qty 10

## 2018-03-10 MED ORDER — THIAMINE HCL 100 MG/ML IJ SOLN: 100.0000 mg | Freq: Every day | INTRAMUSCULAR | Status: DC

## 2018-03-10 MED ORDER — ACETAMINOPHEN 325 MG PO TABS
650.0000 mg | ORAL_TABLET | Freq: Once | ORAL | Status: AC
  Administered 2018-03-10: 650 mg via ORAL
  Filled 2018-03-10: qty 2

## 2018-03-10 MED ORDER — LORAZEPAM 1 MG PO TABS: 1.0000 mg | ORAL_TABLET | Freq: Four times a day (QID) | ORAL | Status: DC | PRN

## 2018-03-10 MED ORDER — VITAMIN B-1 100 MG PO TABS
100.0000 mg | ORAL_TABLET | Freq: Every day | ORAL | Status: DC
  Administered 2018-03-10 – 2018-03-12 (×3): 100 mg via ORAL
  Filled 2018-03-10 (×3): qty 1

## 2018-03-10 MED ORDER — FOLIC ACID 1 MG PO TABS
1.0000 mg | ORAL_TABLET | Freq: Every day | ORAL | Status: DC
  Administered 2018-03-10 – 2018-03-12 (×3): 1 mg via ORAL
  Filled 2018-03-10 (×3): qty 1

## 2018-03-10 MED ORDER — VANCOMYCIN HCL IN DEXTROSE 1-5 GM/200ML-% IV SOLN: 1000.0000 mg | Freq: Once | INTRAVENOUS | Status: DC

## 2018-03-10 MED ORDER — STROKE: EARLY STAGES OF RECOVERY BOOK
Freq: Once | Status: AC
  Administered 2018-03-10: 15:00:00
  Filled 2018-03-10: qty 1

## 2018-03-10 MED ORDER — VANCOMYCIN HCL 10 G IV SOLR
1500.0000 mg | Freq: Once | INTRAVENOUS | Status: DC
  Filled 2018-03-10: qty 1500

## 2018-03-10 MED ORDER — LACTATED RINGERS IV BOLUS (SEPSIS)
1000.0000 mL | Freq: Once | INTRAVENOUS | Status: AC
  Administered 2018-03-10: 1000 mL via INTRAVENOUS

## 2018-03-10 MED ORDER — LORAZEPAM 2 MG/ML IJ SOLN: 1.0000 mg | Freq: Four times a day (QID) | INTRAMUSCULAR | Status: DC | PRN

## 2018-03-10 NOTE — Progress Notes (Signed)
  Echocardiogram 2D Echocardiogram has been performed.  Vuk Skillern T Clell Trahan 03/10/2018, 11:44 AM

## 2018-03-10 NOTE — ED Notes (Signed)
Pt in xray

## 2018-03-10 NOTE — Progress Notes (Signed)
ANTICOAGULATION CONSULT NOTE - Initial Consult  Pharmacy Consult for Heparin Indication: atrial fibrillation  Allergies  Allergen Reactions  . Codeine Rash  . Penicillins Rash    Patient Measurements: Height: 6' (182.9 cm) Weight: 205 lb (93 kg) IBW/kg (Calculated) : 77.6 Heparin Dosing Weight: 90 kg  Vital Signs: Temp: 98.4 F (36.9 C) (12/25 0222) Temp Source: Oral (12/25 0222) BP: 126/83 (12/25 0215) Pulse Rate: 82 (12/25 0215)  Labs: Recent Labs    03/09/18 2334  HGB 15.0  HCT 45.1  PLT 99*  LABPROT 14.2  INR 1.10  CREATININE 2.21*    Estimated Creatinine Clearance: 33.2 mL/min (A) (by C-G formula based on SCr of 2.21 mg/dL (H)).   Medical History: Past Medical History:  Diagnosis Date  . Abnormality of gait 06/14/2014  . Acute kidney failure, unspecified (HCC)   . Acute, but ill-defined, cerebrovascular disease   . Alcohol abuse   . Altered mental status   . Atrial fibrillation (HCC)   . Chronic systolic CHF (congestive heart failure) (HCC)   . CVA (cerebral infarction)   . ETOH abuse   . Hyperlipidemia   . Hypertension   . Nonspecific elevation of levels of transaminase or lactic acid dehydrogenase (LDH)   . Rhabdomyolysis   . Tobacco abuse   . Tobacco use disorder     Medications:  No current facility-administered medications on file prior to encounter.    Current Outpatient Medications on File Prior to Encounter  Medication Sig Dispense Refill  . atorvastatin (LIPITOR) 20 MG tablet Take 1 tablet (20 mg total) by mouth daily at 6 PM. (Patient not taking: Reported on 03/09/2018)    . carvedilol (COREG) 3.125 MG tablet Take 1 tablet (3.125 mg total) by mouth 2 (two) times daily with a meal. (Patient not taking: Reported on 03/09/2018)    . hydrALAZINE (APRESOLINE) 50 MG tablet Take 1 tablet (50 mg total) by mouth every 8 (eight) hours. (Patient not taking: Reported on 03/09/2018)    . levalbuterol (XOPENEX) 0.63 MG/3ML nebulizer solution Take 3  mLs (0.63 mg total) by nebulization every 6 (six) hours as needed for wheezing or shortness of breath. (Patient not taking: Reported on 03/09/2018) 3 mL 12  . Maltodextrin-Xanthan Gum (RESOURCE THICKENUP CLEAR) POWD Used to thicken liquids to pured thick (Patient not taking: Reported on 03/09/2018)    . nicotine (NICODERM CQ - DOSED IN MG/24 HOURS) 21 mg/24hr patch Place 1 patch (21 mg total) onto the skin daily. (Patient not taking: Reported on 03/09/2018) 28 patch 0  . ofloxacin (FLOXIN) 0.3 % otic solution Place 5 drops into the right ear daily. (Patient not taking: Reported on 03/09/2018) 5 mL 0  . rivaroxaban (XARELTO) 20 MG TABS tablet Take 1 tablet (20 mg total) by mouth daily with supper. (Patient not taking: Reported on 03/09/2018) 30 tablet      Assessment: 72 y.o. male with Afib for heparin  Goal of Therapy:  Heparin level 0.3-0.7 units/ml Monitor platelets by anticoagulation protocol: Yes   Plan:  Heparin 3000 units IV bolus, then start heparin 1400 units/hr Check heparin level in 8 hours.   Merle Cirelli, Gary FleetGregory Vernon 03/10/2018,2:43 AM

## 2018-03-10 NOTE — ED Notes (Signed)
CRITICAL VALUE ALERT  Critical Value:  Lactic acid 3.02  Date & Time Notied:  03/10/18 0030  Provider Notified: Yes  Orders Received/Actions taken: Urine collection, will continue to monitor

## 2018-03-10 NOTE — ED Provider Notes (Signed)
Report has been called to Dr. Julian ReilGardner.  Patient is stabilized in the  emergency department.  Urinalysis pending   Zadie RhineWickline, Daisha Filosa, MD 03/10/18 0230

## 2018-03-10 NOTE — ED Notes (Signed)
Pt transported to MRI 

## 2018-03-10 NOTE — Progress Notes (Signed)
STROKE TEAM PROGRESS NOTE   INTERVAL HISTORY He is sitting up in the bed, eating lunch. No family is at the bedside.  He states he takes aspirin but is not interested in [facility]   Vitals:   03/10/18 0315 03/10/18 0401 03/10/18 0600 03/10/18 0826  BP: 105/76 114/75 124/74 103/64  Pulse: 75 75 93 62  Resp: (!) 22 20 18 17   Temp:  98.5 F (36.9 C) 99.5 F (37.5 C) 99.2 F (37.3 C)  TempSrc:  Axillary Oral Oral  SpO2: 93% 95%  97%  Weight:  101.6 kg    Height:  6' (1.829 m)      CBC:  Recent Labs  Lab 03/09/18 2334  WBC 8.2  NEUTROABS 7.9*  HGB 15.0  HCT 45.1  MCV 102.7*  PLT 99*    Basic Metabolic Panel:  Recent Labs  Lab 03/09/18 2334  NA 139  K 3.9  CL 108  CO2 22  GLUCOSE 94  BUN 22  CREATININE 2.21*  CALCIUM 9.1   Lipid Panel:     Component Value Date/Time   CHOL 117 03/10/2018 0327   TRIG 30 03/10/2018 0327   HDL 51 03/10/2018 0327   CHOLHDL 2.3 03/10/2018 0327   VLDL 6 03/10/2018 0327   LDLCALC 60 03/10/2018 0327   HgbA1c:  Lab Results  Component Value Date   HGBA1C 5.6 05/25/2014   Urine Drug Screen:     Component Value Date/Time   LABOPIA NONE DETECTED 05/24/2014 0548   COCAINSCRNUR NONE DETECTED 05/24/2014 0548   LABBENZ NONE DETECTED 05/24/2014 0548   AMPHETMU NONE DETECTED 05/24/2014 0548   THCU NONE DETECTED 05/24/2014 0548   LABBARB NONE DETECTED 05/24/2014 0548    Alcohol Level     Component Value Date/Time   ETH <10 03/09/2018 2335    IMAGING Dg Chest 2 View  Result Date: 03/10/2018 CLINICAL DATA:  Confusion and fevers EXAM: CHEST - 2 VIEW COMPARISON:  05/24/2014 FINDINGS: Cardiac shadow is enlarged. Lungs are well aerated bilaterally. Minimal blunting of the left costophrenic angle is noted. No sizable pneumothorax is seen. IMPRESSION: Minimal blunting of left costophrenic angle. Electronically Signed   By: Alcide CleverMark  Lukens M.D.   On: 03/10/2018 00:18   Mr Shirlee LatchMra Head EAWo Contrast  Result Date: 03/10/2018 CLINICAL DATA:   Intermittent RIGHT-sided weakness for 1 day, potentially chronic. History of stroke, alcohol abuse, hypertension and hyperlipidemia. EXAM: MRI HEAD WITHOUT CONTRAST MRA HEAD WITHOUT CONTRAST TECHNIQUE: Multiplanar, multiecho pulse sequences of the brain and surrounding structures were obtained without intravenous contrast. Angiographic images of the head were obtained using MRA technique without contrast. COMPARISON:  MRI/MRA head May 24, 2014 FINDINGS: MRI HEAD FINDINGS INTRACRANIAL CONTENTS: No reduced diffusion to suggest acute ischemia. Scattered chronic microhemorrhages, some of which are new from prior MRI. Small area LEFT mesial occipital lobe encephalomalacia. Mesial LEFT frontal encephalomalacia. Old small LEFT cerebellar infarct. Moderate to severe parenchymal brain volume loss with ex vacuo dilatation lateral ventricles, RIGHT basal ganglia infarct. No hydrocephalus. Prominent basal ganglia perivascular spaces associated chronic small vessel ischemic changes. Confluent supratentorial and patchy pontine white matter FLAIR T2 hyperintensities. No midline shift, mass effect or masses. No abnormal extra-axial fluid collections. VASCULAR: Normal major intracranial vascular flow voids present at skull base. SKULL AND UPPER CERVICAL SPINE: No abnormal sellar expansion. No suspicious calvarial bone marrow signal. Craniocervical junction maintained. SINUSES/ORBITS: Bilateral mastoid effusions. Small RIGHT maxillary mucosal retention cyst. Mild fronto ethmoid mucosal thickening. Included ocular globes and orbital contents are non-suspicious. OTHER:  None. MRA HEAD FINDINGS-mildly motion degraded examination. ANTERIOR CIRCULATION: Flow related enhancement of the included cervical, petrous, cavernous and supraclinoid internal carotid arteries. Similar moderate stenosis RIGHT cavernous ICA. Patent anterior communicating artery. Patent anterior and middle cerebral arteries. No large vessel occlusion, flow limiting  stenosis, aneurysm. POSTERIOR CIRCULATION: LEFT vertebral artery is dominant. Vertebrobasilar arteries are patent, with normal flow related enhancement of the main branch vessels. 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 Patent posterior cerebral arteries. PCOM infundibula. No large vessel occlusion, flow limiting stenosis,  aneurysm. ANATOMIC VARIANTS: Aplastic RIGHT A1 segment. Source images and MIP images were reviewed. IMPRESSION: MRI HEAD: 1. No acute intracranial process. 2. Old LEFT PCA and LEFT ACA territory infarcts. Old RIGHT basal ganglia and LEFT cerebellar infarcts. 3. Severe chronic small vessel ischemic changes. 4. Moderate to severe parenchymal brain volume loss. MRA HEAD: 1. Motion degraded examination. No emergent large vessel occlusion or flow-limiting stenosis. 2. Moderate stenosis RIGHT cavernous ICA. Electronically Signed   By: Awilda Metro M.D.   On: 03/10/2018 01:43   Mr Brain Wo Contrast  Result Date: 03/10/2018 CLINICAL DATA:  Intermittent RIGHT-sided weakness for 1 day, potentially chronic. History of stroke, alcohol abuse, hypertension and hyperlipidemia. EXAM: MRI HEAD WITHOUT CONTRAST MRA HEAD WITHOUT CONTRAST TECHNIQUE: Multiplanar, multiecho pulse sequences of the brain and surrounding structures were obtained without intravenous contrast. Angiographic images of the head were obtained using MRA technique without contrast. COMPARISON:  MRI/MRA head May 24, 2014 FINDINGS: MRI HEAD FINDINGS INTRACRANIAL CONTENTS: No reduced diffusion to suggest acute ischemia. Scattered chronic microhemorrhages, some of which are new from prior MRI. Small area LEFT mesial occipital lobe encephalomalacia. Mesial LEFT frontal encephalomalacia. Old small LEFT cerebellar infarct. Moderate to severe parenchymal brain volume loss with ex vacuo dilatation lateral ventricles, RIGHT basal ganglia infarct. No hydrocephalus. Prominent basal ganglia perivascular spaces  associated chronic small vessel ischemic changes. Confluent supratentorial and patchy pontine white matter FLAIR T2 hyperintensities. No midline shift, mass effect or masses. No abnormal extra-axial fluid collections. VASCULAR: Normal major intracranial vascular flow voids present at skull base. SKULL AND UPPER CERVICAL SPINE: No abnormal sellar expansion. No suspicious calvarial bone marrow signal. Craniocervical junction maintained. SINUSES/ORBITS: Bilateral mastoid effusions. Small RIGHT maxillary mucosal retention cyst. Mild fronto ethmoid mucosal thickening. Included ocular globes and orbital contents are non-suspicious. OTHER: None. MRA HEAD FINDINGS-mildly motion degraded examination. ANTERIOR CIRCULATION: Flow related enhancement of the included cervical, petrous, cavernous and supraclinoid internal carotid arteries. Similar moderate stenosis RIGHT cavernous ICA. Patent anterior communicating artery. Patent anterior and middle cerebral arteries. No large vessel occlusion, flow limiting stenosis, aneurysm. POSTERIOR CIRCULATION: LEFT vertebral artery is dominant. Vertebrobasilar arteries are patent, with normal flow related enhancement of the main branch vessels. 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 Patent posterior cerebral arteries. PCOM infundibula. No large vessel occlusion, flow limiting stenosis,  aneurysm. ANATOMIC VARIANTS: Aplastic RIGHT A1 segment. Source images and MIP images were reviewed. IMPRESSION: MRI HEAD: 1. No acute intracranial process. 2. Old LEFT PCA and LEFT ACA territory infarcts. Old RIGHT basal ganglia and LEFT cerebellar infarcts. 3. Severe chronic small vessel ischemic changes. 4. Moderate to severe parenchymal brain volume loss. MRA HEAD: 1. Motion degraded examination. No emergent large vessel occlusion or flow-limiting stenosis. 2. Moderate stenosis RIGHT cavernous ICA. Electronically Signed   By: Awilda Metro M.D.   On: 03/10/2018 01:43    2D Echocardiogram  - Left ventricle: Wall thickness was increased in a pattern of moderate LVH. Systolic function was mildly to moderately reduced.  The estimated ejection fraction was in the range of 40% to 45%. Probable hypokinesis of the basalinferolateral and inferior myocardium. - Aortic valve: There was trivial regurgitation. - Left atrium: The atrium was moderately dilated. - Right atrium: The atrium was moderately dilated.   PHYSICAL EXAM Exam: NAD, poorly groomed               Speech:    Speech is normal; fluent and spontaneous with normal comprehension.  Cognition:    The patient is oriented to person, place, and time;     language fluent;    Cranial Nerves:    The pupils are equal, round, and reactive to light.Trigeminal sensation is intact and the muscles of mastication are normal. Right lower facial droop. The palate elevates in the midline. Hearing intact. Voice is normal. Shoulder shrug is normal. The tongue has normal motion without fasciculations.   Coordination: No ataxia/dysmetria notef out of proportion to weaknes  Motor Observation:    no involuntary movements noted. Strength:    Strength is 4/5 right arm and right leg, V/V in the left limbs.      Sensation: intact to LT    ASSESSMENT/PLAN [name] is a 72 y.o. male with history of atrial fibrillation, noncompliant with anticoagulation presenting with R sided weakness.   Likely recrudescence of previous R sided weakness from L brain stroke in setting of medical illness. Will complete stroke workup.  MRI  No acute stroke. Old L PCA , L ACA, R BG and L cerebellar infarcts. Severe Small vessel disease. Atrophy.   MRA  No ELVO. Mod stenosis R ICA  Carotid Doppler  1-39%  2D Echo  EF 40-45%. No source of embolus. Mod LVH  LDL 60  HgbA1c 5.6  IV heparin for VTE prophylaxis  Non compliant with ordered Xarelto (rivaroxaban) daily prior to admission, now on heparin IV. From stroke standpoint, ok  to resume oral agent  Therapy recommendations:  pending   Disposition:  pending   Atrial Fibrillation  Home anticoagulation:  noncomplaint with  Xarelto (rivaroxaban) daily   Now on IV herapin. Ok to change to oral anticoagulant from stroke standpoint. Prefer DOAC if possible. . Continue oral anticoagulation at discharge   Hypertension  Stable  Hyperlipidemia  Home meds:  lipitor 20,   will resume lipitor in hospital  LDL 60, goal < 70  Continue statin at discharge  Other Stroke Risk Factors  Advanced age  Hx Cigarette smoker  Heavy ETOH use, states he quit 2 years ago. advised to drink no more than 2 drink(s) a day  UDS negative  Obesity, Body mass index is 30.38 kg/m., recommend weight loss, diet and exercise as appropriate   Hx stroke/TIA  05/2014  Left PLIC and R frontal DWM and cortical parietal  Infarcts. Old R BG and R pontine infarcts, felt to be embolic secondary to known atrial fibrillation.   08/2011 frontal parasagittal, posterior frontal parasagittal, left parietal parasagittal embolic infarctions secondary to atrial fibrillation recommended to start on Xarelto but pt did not want to quit drinking, so he did not take, no followup since that time  Chronic systolic Congestive heart failure - EF low in past  Other Active Problems  Hx non compliance  Known gait disorder  Temp 101.3 without known source. B Cx pending   CKD  Thrombocytopenia on IV heparin, PLT 90, pharmacy monitoring  Abnormal UA, Cx pending  TSH elevated 10.272  Hospital day # 0  Annie Main, MSN, APRN,  ANVP-BC, AGPCNP-BC Advanced Practice Stroke Nurse [facility] See Amion for Schedule & Pager information 03/10/2018 1:24 PM   Stroke will sign off at this time. Recrudescence of prior stroke. MRI negative. Chronic right -sided weakness. Stroke tea will sign off at this time.  Personally examined patient and images, and have participated in and made any  corrections needed to history, physical, neuro exam,assessment and plan as stated above.  I have personally obtained the history, evaluated lab date, reviewed imaging studies and agree with radiology interpretations.    Naomie DeanAntonia Ahern, MD Stroke Neurology   A total of 35 minutes was spent for the care of this patient, spent on counseling patient and family on different diagnostic and therapeutic options, counseling and coordination of care, riskd ans benefits of management, compliance, or risk factor reduction and education.   To contact Stroke Continuity provider, please refer to [email or web address]. After hours, contact General Neurology

## 2018-03-10 NOTE — H&P (Addendum)
History and Physical    Brian SerLee C Blackstock ZOX:[id number] DOB: 04-Mar-1946 DOA: 03/09/2018  PCP: Patient, No Pcp Per  Patient coming from: Home  I have personally briefly reviewed patient's old medical records in [facility]  Chief Complaint: Weakness  HPI: Brian Mclaughlin is a 72 y.o. male with medical history significant of EtOH abuse, prior stroke, A.Fib, HTN, non-compliant with meds.  Patient presents to the ED with c/o R sided weakness.  This onset at 10pm, lasted ~1 hr or so, resolved in ED.  Similar symptoms last night.  ED Course: On neurology's exam patient was saying that he "always has right-sided weakness" and says he was having more dizziness and lightheadedness today. Story continues to evolve and he does report he is been drinking alcohol.  Patient had fever of 101.3 in ED, no other infectious symptoms, fever has resolved at this point in time.  CXR neg, no meningismus.  UA pending.  MRI brain neg for acute stroke.   Review of Systems: As per HPI otherwise 10 point review of systems negative.   Past Medical History:  Diagnosis Date  . Abnormality of gait 06/14/2014  . Acute kidney failure, unspecified (HCC)   . Acute, but ill-defined, cerebrovascular disease   . Alcohol abuse   . Altered mental status   . Atrial fibrillation (HCC)   . Chronic systolic CHF (congestive heart failure) (HCC)   . CVA (cerebral infarction)   . ETOH abuse   . Hyperlipidemia   . Hypertension   . Nonspecific elevation of levels of transaminase or lactic acid dehydrogenase (LDH)   . Rhabdomyolysis   . Tobacco abuse   . Tobacco use disorder     Past Surgical History:  Procedure Laterality Date  . BACK SURGERY  10 years ago   "slipped disc" repair  . EYE MUSCLE SURGERY     as a teenager "tighten his muscles"     reports that he has quit smoking. His smoking use included cigarettes. He has a 22.50 pack-year smoking history. He does not have any smokeless tobacco history on file. He  reports current alcohol use. He reports that he does not use drugs.  Allergies  Allergen Reactions  . Codeine Rash  . Penicillins Rash    Family History  Problem Relation Age of Onset  . Cancer Mother   . Cancer Father   . Multiple sclerosis Daughter      Prior to Admission medications   Medication Sig Start Date End Date Taking? Authorizing Provider  atorvastatin (LIPITOR) 20 MG tablet Take 1 tablet (20 mg total) by mouth daily at 6 PM. Patient not taking: Reported on 03/09/2018 05/29/14   Leroy SeaSingh, Prashant K, MD  carvedilol (COREG) 3.125 MG tablet Take 1 tablet (3.125 mg total) by mouth 2 (two) times daily with a meal. Patient not taking: Reported on 03/09/2018 05/29/14   Leroy SeaSingh, Prashant K, MD  hydrALAZINE (APRESOLINE) 50 MG tablet Take 1 tablet (50 mg total) by mouth every 8 (eight) hours. Patient not taking: Reported on 03/09/2018 05/29/14   Leroy SeaSingh, Prashant K, MD  levalbuterol Pauline Aus(XOPENEX) 0.63 MG/3ML nebulizer solution Take 3 mLs (0.63 mg total) by nebulization every 6 (six) hours as needed for wheezing or shortness of breath. Patient not taking: Reported on 03/09/2018 05/29/14   Leroy SeaSingh, Prashant K, MD  Maltodextrin-Xanthan Gum (RESOURCE [facility] CLE[facility]) POWD Used to thicken liquids to pured thick Patient not taking: Reported on 03/09/2018 05/29/14   Leroy SeaSingh, Prashant K, MD  nicotine (NICODERM CQ -  DOSED IN MG/24 HOURS) 21 mg/24hr patch Place 1 patch (21 mg total) onto the skin daily. Patient not taking: Reported on 03/09/2018 05/29/14   Leroy SeaSingh, Prashant K, MD  ofloxacin (FLOXIN) 0.3 % otic solution Place 5 drops into the right ear daily. Patient not taking: Reported on 03/09/2018 12/13/12   Peyton NajjarHopper, David H, MD  rivaroxaban (XARELTO) 20 MG TABS tablet Take 1 tablet (20 mg total) by mouth daily with supper. Patient not taking: Reported on 03/09/2018 05/29/14   Leroy SeaSingh, Prashant K, MD    Physical Exam: Vitals:   03/10/18 [id number] 03/10/18 0211 03/10/18 0215 03/10/18 0222  BP: 137/75  126/83     Pulse:  87 82   Resp:  (!) 22 (!) 23   Temp:    98.4 F (36.9 C)  TempSrc:    Oral  SpO2:  97% 97%   Weight:      Height:        Constitutional: NAD, calm, comfortable Eyes: PERRL, lids and conjunctivae normal ENMT: Mucous membranes are moist. Posterior pharynx clear of any exudate or lesions.Normal dentition.  Neck: normal, supple, no masses, no thyromegaly Respiratory: clear to auscultation bilaterally, no wheezing, no crackles. Normal respiratory effort. No accessory muscle use.  Cardiovascular: Regular rate and rhythm, no murmurs / rubs / gallops. No extremity edema. 2+ pedal pulses. No carotid bruits.  Abdomen: no tenderness, no masses palpated. No hepatosplenomegaly. Bowel sounds positive.  Musculoskeletal: no clubbing / cyanosis. No joint deformity upper and lower extremities. Good ROM, no contractures. Normal muscle tone.  Skin: no rashes, lesions, ulcers. No induration Neurologic: Mild R face, arm and leg weakness compared to left. Psychiatric: Normal judgment and insight. Alert and oriented x 3. Normal mood.   Labs on Admission: I have personally reviewed following labs and imaging studies  CBC: Recent Labs  Lab 03/09/18 2334  WBC 8.2  NEUTROABS 7.9*  HGB 15.0  HCT 45.1  MCV 102.7*  PLT 99*   Basic Metabolic Panel: Recent Labs  Lab 03/09/18 2334  NA 139  K 3.9  CL 108  CO2 22  GLUCOSE 94  BUN 22  CREATININE 2.21*  CALCIUM 9.1   GFR: Estimated Creatinine Clearance: 33.2 mL/min (A) (by C-G formula based on SCr of 2.21 mg/dL (H)). Liver Function Tests: Recent Labs  Lab 03/09/18 2334  AST 22  ALT 12  ALKPHOS 57  BILITOT 1.2  PROT 7.1  ALBUMIN 3.5   Recent Labs  Lab 03/09/18 2334  LIPASE 24   No results for input(s): AMMONIA in the last 168 hours. Coagulation Profile: Recent Labs  Lab 03/09/18 2334  INR 1.10   Cardiac Enzymes: No results for input(s): CKTOTAL, CKMB, CKMBINDEX, TROPONINI in the last 168 hours. BNP (last 3 results) No  results for input(s): PROBNP in the last 8760 hours. HbA1C: No results for input(s): HGBA1C in the last 72 hours. CBG: Recent Labs  Lab 03/09/18 2302  GLUCAP 90   Lipid Profile: No results for input(s): CHOL, HDL, LDLCALC, TRIG, CHOLHDL, LDLDIRECT in the last 72 hours. Thyroid Function Tests: Recent Labs    03/09/18 2335  TSH 10.272*   Anemia Panel: No results for input(s): VITAMINB12, FOLATE, FERRITIN, TIBC, IRON, RETICCTPCT in the last 72 hours. Urine analysis:    Component Value Date/Time   COLORURINE YELLOW 05/24/2014 0548   APPEARANCEUR CLEAR 05/24/2014 0548   LABSPEC 1.014 05/24/2014 0548   PHURINE 5.0 05/24/2014 0548   GLUCOSEU NEGATIVE 05/24/2014 0548   HGBUR MODERATE (A) 05/24/2014 0548  BILIRUBINUR NEGATIVE 05/24/2014 0548   KETONESUR NEGATIVE 05/24/2014 0548   PROTEINUR NEGATIVE 05/24/2014 0548   UROBILINOGEN 0.2 05/24/2014 0548   NITRITE NEGATIVE 05/24/2014 0548   LEUKOCYTESUR NEGATIVE 05/24/2014 0548    Radiological Exams on Admission: Dg Chest 2 View  Result Date: 03/10/2018 CLINICAL DATA:  Confusion and fevers EXAM: CHEST - 2 VIEW COMPARISON:  05/24/2014 FINDINGS: Cardiac shadow is enlarged. Lungs are well aerated bilaterally. Minimal blunting of the left costophrenic angle is noted. No sizable pneumothorax is seen. IMPRESSION: Minimal blunting of left costophrenic angle. Electronically Signed   By: Alcide Clever M.D.   On: 03/10/2018 00:18   Mr Shirlee Latch ZO Contrast  Result Date: 03/10/2018 CLINICAL DATA:  Intermittent RIGHT-sided weakness for 1 day, potentially chronic. History of stroke, alcohol abuse, hypertension and hyperlipidemia. EXAM: MRI HEAD WITHOUT CONTRAST MRA HEAD WITHOUT CONTRAST TECHNIQUE: Multiplanar, multiecho pulse sequences of the brain and surrounding structures were obtained without intravenous contrast. Angiographic images of the head were obtained using MRA technique without contrast. COMPARISON:  MRI/MRA head May 24, 2014 FINDINGS:  MRI HEAD FINDINGS INTRACRANIAL CONTENTS: No reduced diffusion to suggest acute ischemia. Scattered chronic microhemorrhages, some of which are new from prior MRI. Small area LEFT mesial occipital lobe encephalomalacia. Mesial LEFT frontal encephalomalacia. Old small LEFT cerebellar infarct. Moderate to severe parenchymal brain volume loss with ex vacuo dilatation lateral ventricles, RIGHT basal ganglia infarct. No hydrocephalus. Prominent basal ganglia perivascular spaces associated chronic small vessel ischemic changes. Confluent supratentorial and patchy pontine white matter FLAIR T2 hyperintensities. No midline shift, mass effect or masses. No abnormal extra-axial fluid collections. VASCULAR: Normal major intracranial vascular flow voids present at skull base. SKULL AND UPPER CERVICAL SPINE: No abnormal sellar expansion. No suspicious calvarial bone marrow signal. Craniocervical junction maintained. SINUSES/ORBITS: Bilateral mastoid effusions. Small RIGHT maxillary mucosal retention cyst. Mild fronto ethmoid mucosal thickening. Included ocular globes and orbital contents are non-suspicious. OTHER: None. MRA HEAD FINDINGS-mildly motion degraded examination. ANTERIOR CIRCULATION: Flow related enhancement of the included cervical, petrous, cavernous and supraclinoid internal carotid arteries. Similar moderate stenosis RIGHT cavernous ICA. Patent anterior communicating artery. Patent anterior and middle cerebral arteries. No large vessel occlusion, flow limiting stenosis, aneurysm. POSTERIOR CIRCULATION: LEFT vertebral artery is dominant. Vertebrobasilar arteries are patent, with normal flow related enhancement of the main branch vessels. 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 Patent posterior cerebral arteries. PCOM infundibula. No large vessel occlusion, flow limiting stenosis,  aneurysm. ANATOMIC VARIANTS: Aplastic RIGHT A1 segment. Source images and MIP images were reviewed.  IMPRESSION: MRI HEAD: 1. No acute intracranial process. 2. Old LEFT PCA and LEFT ACA territory infarcts. Old RIGHT basal ganglia and LEFT cerebellar infarcts. 3. Severe chronic small vessel ischemic changes. 4. Moderate to severe parenchymal brain volume loss. MRA HEAD: 1. Motion degraded examination. No emergent large vessel occlusion or flow-limiting stenosis. 2. Moderate stenosis RIGHT cavernous ICA. Electronically Signed   By: Awilda Metro M.D.   On: 03/10/2018 01:43   Mr Brain Wo Contrast  Result Date: 03/10/2018 CLINICAL DATA:  Intermittent RIGHT-sided weakness for 1 day, potentially chronic. History of stroke, alcohol abuse, hypertension and hyperlipidemia. EXAM: MRI HEAD WITHOUT CONTRAST MRA HEAD WITHOUT CONTRAST TECHNIQUE: Multiplanar, multiecho pulse sequences of the brain and surrounding structures were obtained without intravenous contrast. Angiographic images of the head were obtained using MRA technique without contrast. COMPARISON:  MRI/MRA head May 24, 2014 FINDINGS: MRI HEAD FINDINGS INTRACRANIAL CONTENTS: No reduced diffusion to suggest acute ischemia. Scattered chronic microhemorrhages, some of which are new  from prior MRI. Small area LEFT mesial occipital lobe encephalomalacia. Mesial LEFT frontal encephalomalacia. Old small LEFT cerebellar infarct. Moderate to severe parenchymal brain volume loss with ex vacuo dilatation lateral ventricles, RIGHT basal ganglia infarct. No hydrocephalus. Prominent basal ganglia perivascular spaces associated chronic small vessel ischemic changes. Confluent supratentorial and patchy pontine white matter FLAIR T2 hyperintensities. No midline shift, mass effect or masses. No abnormal extra-axial fluid collections. VASCULAR: Normal major intracranial vascular flow voids present at skull base. SKULL AND UPPER CERVICAL SPINE: No abnormal sellar expansion. No suspicious calvarial bone marrow signal. Craniocervical junction maintained. SINUSES/ORBITS:  Bilateral mastoid effusions. Small RIGHT maxillary mucosal retention cyst. Mild fronto ethmoid mucosal thickening. Included ocular globes and orbital contents are non-suspicious. OTHER: None. MRA HEAD FINDINGS-mildly motion degraded examination. ANTERIOR CIRCULATION: Flow related enhancement of the included cervical, petrous, cavernous and supraclinoid internal carotid arteries. Similar moderate stenosis RIGHT cavernous ICA. Patent anterior communicating artery. Patent anterior and middle cerebral arteries. No large vessel occlusion, flow limiting stenosis, aneurysm. POSTERIOR CIRCULATION: LEFT vertebral artery is dominant. Vertebrobasilar arteries are patent, with normal flow related enhancement of the main branch vessels. 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 Patent posterior cerebral arteries. PCOM infundibula. No large vessel occlusion, flow limiting stenosis,  aneurysm. ANATOMIC VARIANTS: Aplastic RIGHT A1 segment. Source images and MIP images were reviewed. IMPRESSION: MRI HEAD: 1. No acute intracranial process. 2. Old LEFT PCA and LEFT ACA territory infarcts. Old RIGHT basal ganglia and LEFT cerebellar infarcts. 3. Severe chronic small vessel ischemic changes. 4. Moderate to severe parenchymal brain volume loss. MRA HEAD: 1. Motion degraded examination. No emergent large vessel occlusion or flow-limiting stenosis. 2. Moderate stenosis RIGHT cavernous ICA. Electronically Signed   By: Awilda Metro M.D.   On: 03/10/2018 01:43    EKG: Independently reviewed.  Assessment/Plan Principal Problem:   Right sided weakness Active Problems:   HTN (hypertension)   Alcohol abuse   Chronic atrial fibrillation   Noncompliance with medications   Fever   CKD (chronic kidney disease) stage 3, GFR 30-59 ml/min (HCC)    1. R sided weakness - 1. MRI brain neg for acute stroke 2. Unclear if weakness is new or baseline, patients story seeming to evolve. 3. TIA pathway and work  up ordered. 2. Fever - 1. Tylenol PRN fever 2. BCx pending 3. Got cefepime in ED, will hold off on further ABx for the moment 4. UA pending 5. Influenza pending 6. No infectious or localizing symptoms per patient 3. Chronic A.Fib - 1. Tele monitor 2. 2d echo 3. Heparin GTT while here (though non-compliant with anticoagulation as outpt). 4. HTN - 1. Holding BP meds due to concern for possible TIA 5. EtOH abuse - 1. CIWA 6. CKD stage 3 - chronic and stable 7. Possibly Hypothyroid - TSH 10, checking fT4  DVT prophylaxis: Heparin GTT for the moment Code Status: Full Family Communication: No family in room Disposition Plan: Home after admit Consults called: Neuro Admission status: Place in obs    Hillary Bow. DO [facility] Pager [phone] Only works nights!  If 7AM-7PM, please contact the primary day team physician taking care of patient  [email or web address] Password  [facility]  03/10/2018, 2:43 AM

## 2018-03-10 NOTE — Progress Notes (Addendum)
ANTICOAGULATION CONSULT NOTE - Initial Consult  Pharmacy Consult for Heparin Indication: atrial fibrillation  Allergies  Allergen Reactions  . Codeine Rash  . Penicillins Rash    Patient Measurements: Height: 6' (182.9 cm) Weight: 223 lb 15.8 oz (101.6 kg) IBW/kg (Calculated) : 77.6 Heparin Dosing Weight: 90 kg  Vital Signs: Temp: 99.2 F (37.3 C) (12/25 0826) Temp Source: Oral (12/25 0826) BP: 103/64 (12/25 0826) Pulse Rate: 62 (12/25 0826)  Labs: Recent Labs    03/09/18 2334 03/10/18 1221  HGB 15.0  --   HCT 45.1  --   PLT 99*  --   LABPROT 14.2  --   INR 1.10  --   HEPARINUNFRC  --  0.86*  CREATININE 2.21*  --     Estimated Creatinine Clearance: 37.3 mL/min (A) (by C-G formula based on SCr of 2.21 mg/dL (H)).   Medical History: Past Medical History:  Diagnosis Date  . Abnormality of gait 06/14/2014  . Acute kidney failure, unspecified (HCC)   . Acute, but ill-defined, cerebrovascular disease   . Alcohol abuse   . Altered mental status   . Atrial fibrillation (HCC)   . Chronic systolic CHF (congestive heart failure) (HCC)   . CVA (cerebral infarction)   . ETOH abuse   . Hyperlipidemia   . Hypertension   . Nonspecific elevation of levels of transaminase or lactic acid dehydrogenase (LDH)   . Rhabdomyolysis   . Tobacco abuse   . Tobacco use disorder     Medications:  No current facility-administered medications on file prior to encounter.    Current Outpatient Medications on File Prior to Encounter  Medication Sig Dispense Refill  . atorvastatin (LIPITOR) 20 MG tablet Take 1 tablet (20 mg total) by mouth daily at 6 PM. (Patient not taking: Reported on 03/09/2018)    . carvedilol (COREG) 3.125 MG tablet Take 1 tablet (3.125 mg total) by mouth 2 (two) times daily with a meal. (Patient not taking: Reported on 03/09/2018)    . hydrALAZINE (APRESOLINE) 50 MG tablet Take 1 tablet (50 mg total) by mouth every 8 (eight) hours. (Patient not taking: Reported  on 03/09/2018)    . levalbuterol (XOPENEX) 0.63 MG/3ML nebulizer solution Take 3 mLs (0.63 mg total) by nebulization every 6 (six) hours as needed for wheezing or shortness of breath. (Patient not taking: Reported on 03/09/2018) 3 mL 12  . Maltodextrin-Xanthan Gum (RESOURCE THICKENUP CLEAR) POWD Used to thicken liquids to pured thick (Patient not taking: Reported on 03/09/2018)    . nicotine (NICODERM CQ - DOSED IN MG/24 HOURS) 21 mg/24hr patch Place 1 patch (21 mg total) onto the skin daily. (Patient not taking: Reported on 03/09/2018) 28 patch 0  . ofloxacin (FLOXIN) 0.3 % otic solution Place 5 drops into the right ear daily. (Patient not taking: Reported on 03/09/2018) 5 mL 0  . rivaroxaban (XARELTO) 20 MG TABS tablet Take 1 tablet (20 mg total) by mouth daily with supper. (Patient not taking: Reported on 03/09/2018) 30 tablet      Assessment: 72 y.o. male admitted 12/25 with chief complaint of weakness. Patient has past medical history of A. Fib and non-compliance with xarelto (patient reported not taking on admission).   Patient's heparin level is supratherapeutic at 0.86 this afternoon. Patient's Hgb/Hct stable. Platelet count noted to be 99. to be  No s/sx of bleeding noted. Will plan to decrease patient's heparin gtt and recheck levels this evening.   Goal of Therapy:  Heparin level 0.3-0.7 units/ml Monitor platelets  by anticoagulation protocol: Yes   Plan:  Decrease heparin gtt to 1200 units/hr Check heparin level in 8 hours Monitor daily heparin level, CBC, s/sx bleeding  Thank you for allowing pharmacy to be a part of this patient's care.  Lenord Carboebecca Taichi Repka, PharmD PGY1 Pharmacy Resident Phone: [phone]  Please check AMION for all [facility] phone numbers 03/10/2018,1:05 PM

## 2018-03-10 NOTE — ED Notes (Signed)
Delay in lab draw,  Pt currently in MRI 

## 2018-03-10 NOTE — Progress Notes (Signed)
Bilateral carotid duplex completed - Preliminary results in chart review CV Proc. [place] Assia Meanor,RVS 03/10/2018 4:07 pm

## 2018-03-10 NOTE — Plan of Care (Signed)
  Problem: Education: Goal: Knowledge of disease or condition will improve Outcome: Progressing   Problem: Education: Goal: Knowledge of patient specific risk factors addressed and post discharge goals established will improve Outcome: Progressing   Problem: Ischemic Stroke/TIA Tissue Perfusion: Goal: Complications of ischemic stroke/TIA will be minimized Outcome: Progressing

## 2018-03-10 NOTE — ED Provider Notes (Signed)
I assumed care in sign out to follow-up on imaging and labs.  MRI is negative for acute stroke.  Initial lactate was elevated, but is now improved.  He has received IV fluids and IV antibiotics because he was febrile.  Patient is awake and alert, denies any headache.  There is no meningeal signs.  He is currently answering questions appropriately.  He reports that he felt like he was having a stroke and felt weak and dizzy, but had no other real complaints. Per neurology recommendations he need stroke work-up in hospital anyways.  Consult to hospitalist   Zadie RhineWickline, Kyasia Steuck, MD 03/10/18 [phone]

## 2018-03-10 NOTE — ED Notes (Signed)
Report given to 3W RN. All questions answered.  

## 2018-03-10 NOTE — Progress Notes (Signed)
[facility]                                                                              Patient Demographics  Brian Mclaughlin, is a 72 y.o. male, DOB - 05-Feb-1946, XBJ:[id number]  Admit date - 03/09/2018   Admitting Physician Hillary Bow, DO  Outpatient Primary MD for the patient is Patient, No Pcp Per  Outpatient specialists:   LOS - 0  days   Medical records reviewed and are as summarized below:    Chief Complaint  Patient presents with  . Weakness       Brief summary   Brian Mclaughlin is a 72 y.o. male with medical history significant of EtOH abuse, prior stroke, A.Fib, HTN, non-compliant with meds.  Patient presents to the ED with c/o R sided weakness.  This onset at 10pm, lasted ~1 hr or so, resolved in ED.   had similar symptoms in night before the admission.  He also reports " always had right-sided weakness" but then having more dizziness and lightheadedness on the day of admission. Patient had a fever of 101.3 F in the ED, no infectious symptoms.  Chest x-ray negative, MRI negative for acute stroke   Assessment & Plan    Principal Problem:   Right sided weakness in the setting of prior CVAs, patient reports chronic right-sided weakness -MRI brain negative for acute stroke, MRA normal, no emergent large vessel occlusion or flow-limiting stenosis, moderate stenosis right cavernous ICA -Obtain 2D echo, carotid Dopplers -LDL 63, hemoglobin A1c pending -Follow PT evaluation   Active Problems:  Fever in ED -Unclear etiology, blood cultures negative so far, urine cultures in process -No further fevers, patient was empirically placed on antibiotics IV  Chronic atrial fibrillation Follow 2D echo, continue heparin drip while inpatient Patient admits to being  noncompliant with anticoagulation as an outpatient    HTN (hypertension) BP stable, continue holding BP meds due to concern for possible TIA    CKD (chronic kidney disease) stage 3, 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  stroke: mapping our early stages of recovery book   Does not apply Once  . folic acid  1 mg Oral Daily  . multivitamin with minerals  1 tablet Oral Daily  . thiamine  100 mg Oral Daily   Or  . thiamine  100 mg Intravenous Daily   Continuous Infusions: . heparin 1,400 Units/hr (03/10/18 0320)  . vancomycin     PRN Meds:.acetaminophen **OR** acetaminophen (TYLENOL) oral liquid 160 mg/5 mL **OR** acetaminophen, LORazepam **OR** LORazepam   Antibiotics   Anti-infectives (From admission, onward)   Start     Dose/Rate Route Frequency Ordered Stop   03/10/18 0115  aztreonam (AZACTAM) 2 g in sodium chloride 0.9 % 100 mL IVPB  Status:  Discontinued  2 g 200 mL/hr over 30 Minutes Intravenous  Once 03/10/18 0107 03/10/18 0109   03/10/18 0115  metroNIDAZOLE (FLAGYL) IVPB 500 mg  Status:  Discontinued     500 mg 100 mL/hr over 60 Minutes Intravenous Every 8 hours 03/10/18 0107 03/10/18 0225   03/10/18 0115  vancomycin (VANCOCIN) IVPB 1000 mg/200 mL premix  Status:  Discontinued     1,000 mg 200 mL/hr over 60 Minutes Intravenous  Once 03/10/18 0107 03/10/18 0109   03/10/18 0115  ceFEPIme (MAXIPIME) 2 g in sodium chloride 0.9 % 100 mL IVPB     2 g 200 mL/hr over 30 Minutes Intravenous  Once 03/10/18 0109 03/10/18 0240   03/10/18 0115  vancomycin (VANCOCIN) 1,500 mg in sodium chloride 0.9 % 500 mL IVPB     1,500 mg 250 mL/hr over 120 Minutes Intravenous  Once 03/10/18 0109          Subjective:   Brian Mclaughlin was seen and examined today.   Patient denies dizziness, chest pain, shortness of breath, abdominal pain,  N/V/D/C, new weakness, numbess, tingling. No acute events overnight.    Objective:   Vitals:   03/10/18 0315 03/10/18 0401 03/10/18 0600 03/10/18 0826  BP: 105/76 114/75 124/74 103/64  Pulse: 75 75 93 62  Resp: (!) 22 20 18 17   Temp:  98.5 F (36.9 C) 99.5 F (37.5 C) 99.2 F (37.3 C)  TempSrc:  Axillary Oral Oral  SpO2: 93% 95%  97%  Weight:  101.6 kg    Height:  6' (1.829 m)     No intake or output data in the 24 hours ending 03/10/18 1114   Wt Readings from Last 3 Encounters:  03/10/18 101.6 kg  06/14/14 102.5 kg  05/24/14 109 kg     Exam  General: Alert and oriented x 3, NAD  Eyes:   HEENT:  Atraumatic, normocephalic  Cardiovascular: S1 S2 auscultated, Regular rate and rhythm.  Respiratory: CTAB  Gastrointestinal: Soft, nontender, nondistended, + bowel sounds  Ext: no pedal edema bilaterally  Neuro: Mild right face arm and leg weakness compared to left  Musculoskeletal: No digital cyanosis, clubbing  Skin: No rashes  Psych: Normal affect and demeanor, alert and oriented x3    Data Reviewed:  I have personally reviewed following labs and imaging studies  Micro Results Recent Results (from the past 240 hour(s))  Blood Culture (routine x 2)     Status: None (Preliminary result)   Collection Time: 03/10/18  1:45 AM  Result Value Ref Range Status   Specimen Description BLOOD LEFT ARM  Final   Special Requests   Final    BOTTLES DRAWN AEROBIC AND ANAEROBIC Blood Culture adequate volume Performed at [facility], [address] [place], [place] [address]    Culture NO GROWTH < 12 HOURS  Final   Report Status PENDING  Incomplete  Blood Culture (routine x 2)     Status: None (Preliminary result)   Collection Time: 03/10/18  2:00 AM  Result Value Ref Range Status   Specimen Description BLOOD LEFT HAND  Final   Special Requests   Final    BOTTLES DRAWN AEROBIC ONLY Blood Culture adequate volume Performed at [facility], [address], [place], [place] [address]    Culture NO GROWTH < 12 HOURS  Final   Report Status PENDING  Incomplete    Radiology Reports Dg Chest 2 View  Result Date: 03/10/2018 CLINICAL DATA:  Confusion and fevers EXAM:  CHEST - 2 VIEW COMPARISON:  05/24/2014 FINDINGS: Cardiac shadow is enlarged. Lungs are well aerated bilaterally. Minimal blunting of the left costophrenic angle is noted. No sizable pneumothorax is seen. IMPRESSION: Minimal blunting of left costophrenic angle. Electronically Signed   By: Alcide Clever M.D.   On: 03/10/2018 00:18   Mr Shirlee Latch ZO Contrast  Result Date: 03/10/2018 CLINICAL DATA:  Intermittent RIGHT-sided weakness for 1 day, potentially chronic. History of stroke, alcohol abuse, hypertension and hyperlipidemia. EXAM: MRI HEAD WITHOUT CONTRAST MRA HEAD WITHOUT CONTRAST TECHNIQUE: Multiplanar, multiecho pulse sequences of the brain and surrounding structures were obtained without intravenous contrast. Angiographic images of the head were obtained using MRA technique without contrast. COMPARISON:  MRI/MRA head May 24, 2014 FINDINGS: MRI HEAD FINDINGS INTRACRANIAL CONTENTS: No reduced diffusion to suggest acute ischemia. Scattered chronic microhemorrhages, some of which are new from prior MRI. Small area LEFT mesial occipital lobe encephalomalacia. Mesial LEFT frontal encephalomalacia. Old small LEFT cerebellar infarct. Moderate to severe parenchymal brain volume loss with ex vacuo dilatation lateral ventricles, RIGHT basal ganglia infarct. No hydrocephalus. Prominent basal ganglia perivascular spaces associated chronic small vessel ischemic changes. Confluent supratentorial and patchy pontine white matter FLAIR T2 hyperintensities. No midline shift, mass effect or masses. No abnormal extra-axial fluid collections. VASCULAR: Normal major intracranial vascular flow voids present at skull base. SKULL AND UPPER CERVICAL SPINE: No abnormal sellar expansion. No suspicious calvarial bone marrow  signal. Craniocervical junction maintained. SINUSES/ORBITS: Bilateral mastoid effusions. Small RIGHT maxillary mucosal retention cyst. Mild fronto ethmoid mucosal thickening. Included ocular globes and orbital contents are non-suspicious. OTHER: None. MRA HEAD FINDINGS-mildly motion degraded examination. ANTERIOR CIRCULATION: Flow related enhancement of the included cervical, petrous, cavernous and supraclinoid internal carotid arteries. Similar moderate stenosis RIGHT cavernous ICA. Patent anterior communicating artery. Patent anterior and middle cerebral arteries. No large vessel occlusion, flow limiting stenosis, aneurysm. POSTERIOR CIRCULATION: LEFT vertebral artery is dominant. Vertebrobasilar arteries are patent, with normal flow related enhancement of the main branch vessels. 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 Patent posterior cerebral arteries. PCOM infundibula. No large vessel occlusion, flow limiting stenosis,  aneurysm. ANATOMIC VARIANTS: Aplastic RIGHT A1 segment. Source images and MIP images were reviewed. IMPRESSION: MRI HEAD: 1. No acute intracranial process. 2. Old LEFT PCA and LEFT ACA territory infarcts. Old RIGHT basal ganglia and LEFT cerebellar infarcts. 3. Severe chronic small vessel ischemic changes. 4. Moderate to severe parenchymal brain volume loss. MRA HEAD: 1. Motion degraded examination. No emergent large vessel occlusion or flow-limiting stenosis. 2. Moderate stenosis RIGHT cavernous ICA. Electronically Signed   By: Awilda Metro M.D.   On: 03/10/2018 01:43   Mr Brain Wo Contrast  Result Date: 03/10/2018 CLINICAL DATA:  Intermittent RIGHT-sided weakness for 1 day, potentially chronic. History of stroke, alcohol abuse, hypertension and hyperlipidemia. EXAM: MRI HEAD WITHOUT CONTRAST MRA HEAD WITHOUT CONTRAST TECHNIQUE: Multiplanar, multiecho pulse sequences of the brain and surrounding structures were obtained without intravenous contrast.  Angiographic images of the head were obtained using MRA technique without contrast. COMPARISON:  MRI/MRA head May 24, 2014 FINDINGS: MRI HEAD FINDINGS INTRACRANIAL CONTENTS: No reduced diffusion to suggest acute ischemia. Scattered chronic microhemorrhages, some of which are new from prior MRI. Small area LEFT mesial occipital lobe encephalomalacia. Mesial LEFT frontal encephalomalacia. Old small LEFT cerebellar infarct. Moderate to severe parenchymal brain volume loss with ex vacuo dilatation lateral ventricles, RIGHT basal ganglia infarct. No hydrocephalus. Prominent basal ganglia perivascular spaces associated chronic small vessel ischemic changes. Confluent supratentorial and patchy pontine white matter  FLAIR T2 hyperintensities. No midline shift, mass effect or masses. No abnormal extra-axial fluid collections. VASCULAR: Normal major intracranial vascular flow voids present at skull base. SKULL AND UPPER CERVICAL SPINE: No abnormal sellar expansion. No suspicious calvarial bone marrow signal. Craniocervical junction maintained. SINUSES/ORBITS: Bilateral mastoid effusions. Small RIGHT maxillary mucosal retention cyst. Mild fronto ethmoid mucosal thickening. Included ocular globes and orbital contents are non-suspicious. OTHER: None. MRA HEAD FINDINGS-mildly motion degraded examination. ANTERIOR CIRCULATION: Flow related enhancement of the included cervical, petrous, cavernous and supraclinoid internal carotid arteries. Similar moderate stenosis RIGHT cavernous ICA. Patent anterior communicating artery. Patent anterior and middle cerebral arteries. No large vessel occlusion, flow limiting stenosis, aneurysm. POSTERIOR CIRCULATION: LEFT vertebral artery is dominant. Vertebrobasilar arteries are patent, with normal flow related enhancement of the main branch vessels. 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 Patent posterior cerebral arteries. PCOM infundibula. No large vessel  occlusion, flow limiting stenosis,  aneurysm. ANATOMIC VARIANTS: Aplastic RIGHT A1 segment. Source images and MIP images were reviewed. IMPRESSION: MRI HEAD: 1. No acute intracranial process. 2. Old LEFT PCA and LEFT ACA territory infarcts. Old RIGHT basal ganglia and LEFT cerebellar infarcts. 3. Severe chronic small vessel ischemic changes. 4. Moderate to severe parenchymal brain volume loss. MRA HEAD: 1. Motion degraded examination. No emergent large vessel occlusion or flow-limiting stenosis. 2. Moderate stenosis RIGHT cavernous ICA. Electronically Signed   By: Awilda Metroourtnay  Bloomer M.D.   On: 03/10/2018 01:43    Lab Data:  CBC: Recent Labs  Lab 03/09/18 2334  WBC 8.2  NEUTROABS 7.9*  HGB 15.0  HCT 45.1  MCV 102.7*  PLT 99*   Basic Metabolic Panel: Recent Labs  Lab 03/09/18 2334  NA 139  K 3.9  CL 108  CO2 22  GLUCOSE 94  BUN 22  CREATININE 2.21*  CALCIUM 9.1   GFR: Estimated Creatinine Clearance: 37.3 mL/min (A) (by C-G formula based on SCr of 2.21 mg/dL (H)). Liver Function Tests: Recent Labs  Lab 03/09/18 2334  AST 22  ALT 12  ALKPHOS 57  BILITOT 1.2  PROT 7.1  ALBUMIN 3.5   Recent Labs  Lab 03/09/18 2334  LIPASE 24   No results for input(s): AMMONIA in the last 168 hours. Coagulation Profile: Recent Labs  Lab 03/09/18 2334  INR 1.10   Cardiac Enzymes: No results for input(s): CKTOTAL, CKMB, CKMBINDEX, TROPONINI in the last 168 hours. BNP (last 3 results) No results for input(s): PROBNP in the last 8760 hours. HbA1C: No results for input(s): HGBA1C in the last 72 hours. CBG: Recent Labs  Lab 03/09/18 2302  GLUCAP 90   Lipid Profile: Recent Labs    03/10/18 0327  CHOL 117  HDL 51  LDLCALC 60  TRIG 30  CHOLHDL 2.3   Thyroid Function Tests: Recent Labs    03/09/18 2335 03/10/18 0327  TSH 10.272*  --   FREET4  --  1.25   Anemia Panel: No results for input(s): VITAMINB12, FOLATE, FERRITIN, TIBC, IRON, RETICCTPCT in the last 72  hours. Urine analysis:    Component Value Date/Time   COLORURINE YELLOW 03/10/2018 1017   APPEARANCEUR CLOUDY (A) 03/10/2018 1017   LABSPEC 1.016 03/10/2018 1017   PHURINE 5.0 03/10/2018 1017   GLUCOSEU NEGATIVE 03/10/2018 1017   HGBUR MODERATE (A) 03/10/2018 1017   BILIRUBINUR NEGATIVE 03/10/2018 1017   KETONESUR NEGATIVE 03/10/2018 1017   PROTEINUR NEGATIVE 03/10/2018 1017   UROBILINOGEN 0.2 05/24/2014 0548   NITRITE POSITIVE (A) 03/10/2018 1017   LEUKOCYTESUR LARGE (A) 03/10/2018 1017  Thad Rangeripudeep Wessie Shanks M.D. [facility] 03/10/2018, 11:14 AM  Pager: [phone] Between 7am to 7pm - call Pager - [phone]  After 7pm go to [email or web address] - password [email or web address]  Call night coverage person covering after 7pm

## 2018-03-10 NOTE — Progress Notes (Signed)
ANTICOAGULATION CONSULT NOTE - Initial Consult  Pharmacy Consult for Heparin Indication: atrial fibrillation  Allergies  Allergen Reactions  . Codeine Rash  . Penicillins Rash    Patient Measurements: Height: 6' (182.9 cm) Weight: 223 lb 15.8 oz (101.6 kg) IBW/kg (Calculated) : 77.6 Heparin Dosing Weight: 90 kg  Vital Signs: Temp: 99.2 F (37.3 C) (12/25 1936) Temp Source: Oral (12/25 1936) BP: 94/69 (12/25 1936) Pulse Rate: 73 (12/25 1936)  Labs: Recent Labs    03/09/18 2334 03/10/18 1221 03/10/18 2022  HGB 15.0  --   --   HCT 45.1  --   --   PLT 99*  --   --   LABPROT 14.2  --   --   INR 1.10  --   --   HEPARINUNFRC  --  0.86* 0.54  CREATININE 2.21*  --   --     Estimated Creatinine Clearance: 37.3 mL/min (A) (by C-G formula based on SCr of 2.21 mg/dL (H)).   Medical History: Past Medical History:  Diagnosis Date  . Abnormality of gait 06/14/2014  . Acute kidney failure, unspecified (HCC)   . Acute, but ill-defined, cerebrovascular disease   . Alcohol abuse   . Altered mental status   . Atrial fibrillation (HCC)   . Chronic systolic CHF (congestive heart failure) (HCC)   . CVA (cerebral infarction)   . ETOH abuse   . Hyperlipidemia   . Hypertension   . Nonspecific elevation of levels of transaminase or lactic acid dehydrogenase (LDH)   . Rhabdomyolysis   . Tobacco abuse   . Tobacco use disorder     Medications:  No current facility-administered medications on file prior to encounter.    Current Outpatient Medications on File Prior to Encounter  Medication Sig Dispense Refill  . atorvastatin (LIPITOR) 20 MG tablet Take 1 tablet (20 mg total) by mouth daily at 6 PM. (Patient not taking: Reported on 03/09/2018)    . carvedilol (COREG) 3.125 MG tablet Take 1 tablet (3.125 mg total) by mouth 2 (two) times daily with a meal. (Patient not taking: Reported on 03/09/2018)    . hydrALAZINE (APRESOLINE) 50 MG tablet Take 1 tablet (50 mg total) by mouth every  8 (eight) hours. (Patient not taking: Reported on 03/09/2018)    . levalbuterol (XOPENEX) 0.63 MG/3ML nebulizer solution Take 3 mLs (0.63 mg total) by nebulization every 6 (six) hours as needed for wheezing or shortness of breath. (Patient not taking: Reported on 03/09/2018) 3 mL 12  . Maltodextrin-Xanthan Gum (RESOURCE THICKENUP CLEAR) POWD Used to thicken liquids to pured thick (Patient not taking: Reported on 03/09/2018)    . nicotine (NICODERM CQ - DOSED IN MG/24 HOURS) 21 mg/24hr patch Place 1 patch (21 mg total) onto the skin daily. (Patient not taking: Reported on 03/09/2018) 28 patch 0  . ofloxacin (FLOXIN) 0.3 % otic solution Place 5 drops into the right ear daily. (Patient not taking: Reported on 03/09/2018) 5 mL 0  . rivaroxaban (XARELTO) 20 MG TABS tablet Take 1 tablet (20 mg total) by mouth daily with supper. (Patient not taking: Reported on 03/09/2018) 30 tablet      Assessment: 72 y.o. male admitted 12/25 with chief complaint of weakness. Patient has past medical history of A. Fib and non-compliance with xarelto (patient reported not taking on admission).   Patient's heparin level is therapeutic at 0.54. Patient's Hgb/Hct stable. Platelet count noted to be 99. No s/sx of bleeding noted.   Goal of Therapy:  Heparin level  0.3-0.7 units/ml Monitor platelets by anticoagulation protocol: Yes   Plan:  Continue heparin gtt at 1200 units/hr Monitor daily heparin level, CBC, s/sx bleeding  Thank you for allowing pharmacy to be a part of this patient's care.  Foye DeerAnna Ellenie Salome, Pharm D PGY1 Pharmacy Resident  Phone [phone] Please use AMION for clinical pharmacists numbers  03/10/2018      9:30 PM

## 2018-03-11 DIAGNOSIS — R69 Illness, unspecified: Secondary | ICD-10-CM | POA: Diagnosis not present

## 2018-03-11 DIAGNOSIS — Z7901 Long term (current) use of anticoagulants: Secondary | ICD-10-CM | POA: Diagnosis not present

## 2018-03-11 DIAGNOSIS — I482 Chronic atrial fibrillation, unspecified: Secondary | ICD-10-CM | POA: Diagnosis not present

## 2018-03-11 DIAGNOSIS — R531 Weakness: Secondary | ICD-10-CM | POA: Diagnosis not present

## 2018-03-11 DIAGNOSIS — R509 Fever, unspecified: Secondary | ICD-10-CM | POA: Diagnosis not present

## 2018-03-11 DIAGNOSIS — Z8673 Personal history of transient ischemic attack (TIA), and cerebral infarction without residual deficits: Secondary | ICD-10-CM | POA: Diagnosis not present

## 2018-03-11 DIAGNOSIS — I129 Hypertensive chronic kidney disease with stage 1 through stage 4 chronic kidney disease, or unspecified chronic kidney disease: Secondary | ICD-10-CM | POA: Diagnosis not present

## 2018-03-11 DIAGNOSIS — F1010 Alcohol abuse, uncomplicated: Secondary | ICD-10-CM | POA: Diagnosis not present

## 2018-03-11 DIAGNOSIS — N179 Acute kidney failure, unspecified: Secondary | ICD-10-CM | POA: Diagnosis not present

## 2018-03-11 DIAGNOSIS — Z79899 Other long term (current) drug therapy: Secondary | ICD-10-CM | POA: Diagnosis not present

## 2018-03-11 DIAGNOSIS — R197 Diarrhea, unspecified: Secondary | ICD-10-CM | POA: Diagnosis not present

## 2018-03-11 DIAGNOSIS — N183 Chronic kidney disease, stage 3 (moderate): Secondary | ICD-10-CM | POA: Diagnosis not present

## 2018-03-11 DIAGNOSIS — Z87891 Personal history of nicotine dependence: Secondary | ICD-10-CM | POA: Diagnosis not present

## 2018-03-11 LAB — URINE CULTURE: Culture: <10000 — AB

## 2018-03-11 LAB — BRAIN NATRIURETIC PEPTIDE: B Natriuretic Peptide: 257.2 pg/mL — ABNORMAL HIGH (ref 0.0–100.0)

## 2018-03-11 LAB — CBC
HCT: 38.9 % — ABNORMAL LOW (ref 39.0–52.0)
Hemoglobin: 13.1 g/dL (ref 13.0–17.0)
MCH: 33.9 pg (ref 26.0–34.0)
MCHC: 33.7 g/dL (ref 30.0–36.0)
MCV: 100.5 fL — ABNORMAL HIGH (ref 80.0–100.0)
Platelets: 76 10*3/uL — ABNORMAL LOW (ref 150–400)
RBC: 3.87 MIL/uL — ABNORMAL LOW (ref 4.22–5.81)
RDW: 12.6 % (ref 11.5–15.5)
WBC: 17.2 10*3/uL — ABNORMAL HIGH (ref 4.0–10.5)
nRBC: 0.1 % (ref 0.0–0.2)

## 2018-03-11 LAB — GLUCOSE, CAPILLARY
Glucose-Capillary: 91 mg/dL (ref 70–99)
Glucose-Capillary: 85 mg/dL (ref 70–99)
Glucose-Capillary: 86 mg/dL (ref 70–99)
Glucose-Capillary: 80 mg/dL (ref 70–99)

## 2018-03-11 LAB — HEMOGLOBIN A1C
Hgb A1c MFr Bld: 5.4 % (ref 4.8–5.6)
Mean Plasma Glucose: 108 mg/dL

## 2018-03-11 LAB — HEPARIN LEVEL (UNFRACTIONATED): Heparin Unfractionated: 0.33 IU/mL (ref 0.30–0.70)

## 2018-03-11 MED ORDER — SODIUM CHLORIDE 0.9 % IV SOLN
1.0000 g | INTRAVENOUS | Status: DC
  Administered 2018-03-11 – 2018-03-12 (×2): 1 g via INTRAVENOUS
  Filled 2018-03-11 (×2): qty 10

## 2018-03-11 MED ORDER — LOPERAMIDE HCL 2 MG PO CAPS
4.0000 mg | ORAL_CAPSULE | Freq: Once | ORAL | Status: AC
  Administered 2018-03-11: 4 mg via ORAL
  Filled 2018-03-11: qty 2

## 2018-03-11 MED ORDER — LOPERAMIDE HCL 2 MG PO CAPS: 2.0000 mg | ORAL_CAPSULE | ORAL | Status: DC | PRN

## 2018-03-11 NOTE — [place] FL2 (Signed)
[place] MEDICAID FL2 LEVEL OF CARE SCREENING TOOL     IDENTIFICATION  Patient Name: Brian Mclaughlin Birthdate: 1945-10-14 Sex: male Admission Date (Current Location): 03/09/2018  [facility] [place]   Facility and Address:  [facility], [address], [place], [place] [address]      Provider Number: [id number]  Attending Physician Name and Address:  Cathren Harshai, Ripudeep K, MD  Relative Name and Phone Number:  Lavonna Monarchlicia Atkins; friend; [phone]    Current Level of Care: Hospital Recommended Level of Care: Skilled Nursing Facility Prior Approval Number:    Date Approved/Denied:   PASRR Number: [id number]  Discharge Plan: SNF    Current Diagnoses: Patient Active Problem List   Diagnosis Date Noted  . Right sided weakness 03/10/2018  . Fever 03/10/2018  . CKD (chronic kidney disease) stage 3, GFR 30-59 ml/min (HCC) 03/10/2018  . AKI (acute kidney injury) (HCC)   . Abnormality of gait 06/14/2014  . Chronic atrial fibrillation   . Stroke, embolic (HCC)   . Noncompliance with medications   . Acute CVA (cerebrovascular accident) (HCC) 05/24/2014  . Acute ischemic stroke (HCC) 05/24/2014  . CVA (cerebral infarction) 05/24/2014  . Chronic systolic CHF (congestive heart failure) (HCC)   . Acute systolic CHF (congestive heart failure) (HCC) 08/25/2011  . Stroke (HCC) 08/23/2011  . HTN (hypertension) 08/23/2011  . Alcohol abuse 08/23/2011  . Tobacco abuse 08/23/2011  . LFTs abnormal 08/23/2011  . Acute renal failure (HCC) 08/23/2011  . Rhabdomyolysis 08/23/2011    Orientation RESPIRATION BLADDER Height & Weight     Self, Time, Situation, Place  Normal Incontinent, External catheter Weight: 223 lb 15.8 oz (101.6 kg) Height:  6' (182.9 cm)  BEHAVIORAL SYMPTOMS/MOOD NEUROLOGICAL BOWEL NUTRITION STATUS      Incontinent Diet(see discharge summary)  AMBULATORY STATUS COMMUNICATION OF NEEDS Skin   Limited Assist Verbally Normal                       Personal Care Assistance Level of Assistance  Bathing, Feeding, Dressing Bathing Assistance: Limited assistance Feeding assistance: Independent Dressing Assistance: Limited assistance     Functional Limitations Info  Sight, Hearing, Speech Sight Info: Adequate Hearing Info: Adequate Speech Info: Adequate    SPECIAL CARE FACTORS FREQUENCY  OT (By licensed OT), PT (By licensed PT)     PT Frequency: 5x week OT Frequency: 5x week            Contractures      Additional Factors Info  Code Status, Allergies Code Status Info: Full Code Allergies Info: CODEINE, PENICILLINS            Current Medications (03/11/2018):  This is the current hospital active medication list Current Facility-Administered Medications  Medication Dose Route Frequency Provider Last Rate Last Dose  . acetaminophen (TYLENOL) tablet 650 mg  650 mg Oral Q4H PRN Hillary BowGardner, Jared M, DO       Or  . acetaminophen (TYLENOL) solution 650 mg  650 mg Per Tube Q4H PRN Hillary BowGardner, Jared M, DO       Or  . acetaminophen (TYLENOL) suppository 650 mg  650 mg Rectal Q4H PRN Hillary BowGardner, Jared M, DO      . atorvastatin (LIPITOR) tablet 20 mg  20 mg Oral q1800 Annie MainBiby, Sharon L, NP   20 mg at 03/10/18 1757  . cefTRIAXone (ROCEPHIN) 1 g in sodium chloride 0.9 % 100 mL IVPB  1 g Intravenous Q24H Rai, Ripudeep  K, MD 200 mL/hr at 03/11/18 0924 1 g at 03/11/18 0924  . folic acid (FOLVITE) tablet 1 mg  1 mg Oral Daily Lyda PeroneGardner, Jared M, DO   1 mg at 03/11/18 [id number]  . heparin ADULT infusion 100 units/mL (25000 units/26250mL sodium chloride 0.45%)  1,200 Units/hr Intravenous Continuous Otis PeakFanning, Rebecca M, [facility] 12 mL/hr at 03/10/18 1758 1,200 Units/hr at 03/10/18 1758  . loperamide (IMODIUM) capsule 2 mg  2 mg Oral PRN Rai, Ripudeep K, MD      . LORazepam (ATIVAN) tablet 1 mg  1 mg Oral Q6H PRN Hillary BowGardner, Jared M, DO       Or  . LORazepam (ATIVAN) injection 1 mg  1 mg Intravenous Q6H PRN Hillary BowGardner, Jared M, DO      .  multivitamin with minerals tablet 1 tablet  1 tablet Oral Daily Hillary BowGardner, Jared M, DO   1 tablet at 03/11/18 [id number]  . thiamine (VITAMIN B-1) tablet 100 mg  100 mg Oral Daily Lyda PeroneGardner, Jared M, DO   100 mg at 03/11/18 [id number]   Or  . thiamine (B-1) injection 100 mg  100 mg Intravenous Daily Julian ReilGardner, Jared M, DO      . vancomycin (VANCOCIN) 1,500 mg in sodium chloride 0.9 % 500 mL IVPB  1,500 mg Intravenous Once Zadie RhineWickline, Donald, MD         Discharge Medications: Please see discharge summary for a list of discharge medications.  Relevant Imaging Results:  Relevant Lab Results:   Additional Information SS[id number]  Linsi Humann [place], [place]

## 2018-03-11 NOTE — Progress Notes (Signed)
ANTICOAGULATION CONSULT NOTE   Pharmacy Consult for Heparin Indication: atrial fibrillation  Allergies  Allergen Reactions  . Codeine Rash  . Penicillins Rash    Patient Measurements: Height: 6' (182.9 cm) Weight: 223 lb 15.8 oz (101.6 kg) IBW/kg (Calculated) : 77.6 Heparin Dosing Weight: 90 kg  Vital Signs: Temp: 98.7 F (37.1 C) (12/26 0810) Temp Source: Oral (12/26 0810) BP: 122/87 (12/26 0810) Pulse Rate: 76 (12/26 0810)  Labs: Recent Labs    03/09/18 2334 03/10/18 1221 03/10/18 2022 03/11/18 0445  HGB 15.0  --   --  13.1  HCT 45.1  --   --  38.9*  PLT 99*  --   --  76*  LABPROT 14.2  --   --   --   INR 1.10  --   --   --   HEPARINUNFRC  --  0.86* 0.54 0.33  CREATININE 2.21*  --   --   --     Estimated Creatinine Clearance: 37.3 mL/min (A) (by C-G formula based on SCr of 2.21 mg/dL (H)).   Medical History: Past Medical History:  Diagnosis Date  . Abnormality of gait 06/14/2014  . Acute kidney failure, unspecified (HCC)   . Acute, but ill-defined, cerebrovascular disease   . Alcohol abuse   . Altered mental status   . Atrial fibrillation (HCC)   . Chronic systolic CHF (congestive heart failure) (HCC)   . CVA (cerebral infarction)   . ETOH abuse   . Hyperlipidemia   . Hypertension   . Nonspecific elevation of levels of transaminase or lactic acid dehydrogenase (LDH)   . Rhabdomyolysis   . Tobacco abuse   . Tobacco use disorder     Medications:  No current facility-administered medications on file prior to encounter.    Current Outpatient Medications on File Prior to Encounter  Medication Sig Dispense Refill  . atorvastatin (LIPITOR) 20 MG tablet Take 1 tablet (20 mg total) by mouth daily at 6 PM. (Patient not taking: Reported on 03/09/2018)    . carvedilol (COREG) 3.125 MG tablet Take 1 tablet (3.125 mg total) by mouth 2 (two) times daily with a meal. (Patient not taking: Reported on 03/09/2018)    . hydrALAZINE (APRESOLINE) 50 MG tablet Take 1  tablet (50 mg total) by mouth every 8 (eight) hours. (Patient not taking: Reported on 03/09/2018)    . levalbuterol (XOPENEX) 0.63 MG/3ML nebulizer solution Take 3 mLs (0.63 mg total) by nebulization every 6 (six) hours as needed for wheezing or shortness of breath. (Patient not taking: Reported on 03/09/2018) 3 mL 12  . Maltodextrin-Xanthan Gum (RESOURCE THICKENUP CLEAR) POWD Used to thicken liquids to pured thick (Patient not taking: Reported on 03/09/2018)    . nicotine (NICODERM CQ - DOSED IN MG/24 HOURS) 21 mg/24hr patch Place 1 patch (21 mg total) onto the skin daily. (Patient not taking: Reported on 03/09/2018) 28 patch 0  . ofloxacin (FLOXIN) 0.3 % otic solution Place 5 drops into the right ear daily. (Patient not taking: Reported on 03/09/2018) 5 mL 0  . rivaroxaban (XARELTO) 20 MG TABS tablet Take 1 tablet (20 mg total) by mouth daily with supper. (Patient not taking: Reported on 03/09/2018) 30 tablet      Assessment: 72 y.o. male admitted 12/25 with chief complaint of weakness. Patient has past medical history of A. Fib and non-compliance with xarelto (patient reported not taking on admission).   Patient's heparin level is therapeutic at 0.33.  Hgb 15.0>13. Platelet count noted to be 99  on admit >down to 76k . No s/sx of bleeding noted.  Goal of Therapy:  Heparin level 0.3-0.7 units/ml Monitor platelets by anticoagulation protocol: Yes   Plan:  Continue heparin gtt at 1200 units/hr Monitor daily heparin level, CBC, s/sx bleeding  Thank you for allowing pharmacy to be a part of this patient's care.  Noah Delaineuth Ashon Rosenberg, RPh Clinical Pharmacist Phone [phone] or please use [facility] for clinical pharmacists numbers  03/11/2018      10:40 AM

## 2018-03-11 NOTE — Evaluation (Signed)
Physical Therapy Evaluation Patient Details Name: Brian Mclaughlin MRN: [id number] DOB: 1945/11/03 Today's Date: 03/11/2018   History of Present Illness  Pt is a 72 y/o male admitted secondary to weakness. MRI was negative for any acute findings. PMH including but not limited to EtOH abuse, prior stroke, A.Fib, HTN, non-compliant with meds.    Clinical Impression  Pt presented supine in bed with HOB elevated, awake and willing to participate in therapy session. Prior to admission, pt reported that he ambulated with use of cane and was independent with ADLs. Pt reported that he lives alone in a single level home with a couple of steps to enter. Pt currently requires min guard for bed mobility, mod A for transfers and min guard to min A to ambulate with RW. Of note, pt incontinent of bowel during ambulation with no awareness or warning of it happening. Pt would continue to benefit from skilled physical therapy services at this time while admitted and after d/c to address the below listed limitations in order to improve overall safety and independence with functional mobility.     Follow Up Recommendations SNF;Supervision/Assistance - 24 hour    Equipment Recommendations  Rolling walker with 5" wheels    Recommendations for Other Services       Precautions / Restrictions Precautions Precautions: Fall Precaution Comments: incontinent bowel Restrictions Weight Bearing Restrictions: No      Mobility  Bed Mobility Overal bed mobility: Needs Assistance Bed Mobility: Supine to Sit     Supine to sit: Min guard;HOB elevated     General bed mobility comments: greatly increased time and effort, use of bed rail and min guard for safety  Transfers Overall transfer level: Needs assistance Equipment used: Rolling walker (2 wheeled) Transfers: Sit to/from Stand Sit to Stand: From elevated surface;Mod assist         General transfer comment: increased time and effort, bed in elevated  position as pt unable to stand from standard bed height after multiple attempts; mod A to power into standing and for stability with transition  Ambulation/Gait Ambulation/Gait assistance: Min guard;Min assist Gait Distance (Feet): 50 Feet Assistive device: Rolling walker (2 wheeled) Gait Pattern/deviations: Step-to pattern;Decreased step length - right;Decreased step length - left;Decreased stance time - right;Decreased stride length;Decreased dorsiflexion - right;Decreased weight shift to right Gait velocity: decreased Gait velocity interpretation: <1.31 ft/sec, indicative of household ambulator General Gait Details: pt required constant close min guard and occasional min A for stability, especially with turns and directional changes. Pt with noticeable R LE drag with zero foot clearance during swing phase. Of note, pt also incontinent of bowel during ambulation with no awareness or warning.  Stairs            Wheelchair Mobility    Modified Rankin (Stroke Patients Only)       Balance Overall balance assessment: Needs assistance Sitting-balance support: Feet supported Sitting balance-Leahy Scale: Fair     Standing balance support: Bilateral upper extremity supported Standing balance-Leahy Scale: Poor                               Pertinent Vitals/Pain Pain Assessment: No/denies pain    Home Living Family/patient expects to be discharged to:: Private residence Living Arrangements: Alone Available Help at Discharge: Family;Friend(s);Available PRN/intermittently Type of Home: House Home Access: Stairs to enter Entrance Stairs-Rails: None Entrance Stairs-Number of Steps: 2 Home Layout: One level Home Equipment: Cane - single point  Prior Function Level of Independence: Independent with assistive device(s)         Comments: ambulates with use of a cane     Hand Dominance        Extremity/Trunk Assessment   Upper Extremity  Assessment Upper Extremity Assessment: Defer to OT evaluation;Generalized weakness    Lower Extremity Assessment Lower Extremity Assessment: Generalized weakness;RLE deficits/detail RLE Deficits / Details: MMT revealed 2/5 for hip flexion, knee extension and ankle DF. Pt with functional deficits noted as well during gait with zero foot clearance during swing phase and dragging his R LE behind RLE Coordination: decreased gross motor;decreased fine motor       Communication   Communication: No difficulties  Cognition Arousal/Alertness: Awake/alert Behavior During Therapy: Flat affect Overall Cognitive Status: Impaired/Different from baseline Area of Impairment: Following commands;Safety/judgement;Problem solving                       Following Commands: Follows one step commands with increased time Safety/Judgement: Decreased awareness of deficits;Decreased awareness of safety   Problem Solving: Difficulty sequencing;Requires verbal cues;Requires tactile cues        General Comments      Exercises     Assessment/Plan    PT Assessment Patient needs continued PT services  PT Problem List Decreased strength;Decreased activity tolerance;Decreased balance;Decreased mobility;Decreased coordination;Decreased cognition;Decreased knowledge of use of DME;Decreased safety awareness;Decreased knowledge of precautions       PT Treatment Interventions DME instruction;Gait training;Stair training;Functional mobility training;Therapeutic activities;Therapeutic exercise;Balance training;Neuromuscular re-education;Cognitive remediation;Patient/family education    PT Goals (Current goals can be found in the Care Plan section)  Acute Rehab PT Goals Patient Stated Goal: to get up and walk PT Goal Formulation: With patient Time For Goal Achievement: 03/25/18 Potential to Achieve Goals: Fair    Frequency Min 2X/week   Barriers to discharge        Co-evaluation                AM-PAC PT "6 Clicks" Mobility  Outcome Measure Help needed turning from your back to your side while in a flat bed without using bedrails?: None Help needed moving from lying on your back to sitting on the side of a flat bed without using bedrails?: None Help needed moving to and from a bed to a chair (including a wheelchair)?: A Little Help needed standing up from a chair using your arms (e.g., wheelchair or bedside chair)?: A Lot Help needed to walk in hospital room?: A Little Help needed climbing 3-5 steps with a railing? : Total 6 Click Score: 17    End of Session Equipment Utilized During Treatment: Gait belt Activity Tolerance: Patient tolerated treatment well;Other (comment)(limited secondary to incontinent of bowel) Patient left: with call bell/phone within reach;with nursing/sitter in room;Other (comment)(on toilet in bathroom) Nurse Communication: Mobility status;Other (comment)(pt incontinent of bowel and on toilet) PT Visit Diagnosis: Other abnormalities of gait and mobility (R26.89);Muscle weakness (generalized) (M62.81)    Time: [id number] PT Time Calculation (min) (ACUTE ONLY): 25 min   Charges:   PT Evaluation $PT Eval Moderate Complexity: 1 Mod PT Treatments $Gait Training: 8-22 mins        Deborah ChalkJennifer Shakyra [place], [place]PT, DPT  Acute Rehabilitation Services Pager [phone] Office [phone]    Alessandra BevelsJennifer M Jacklyne Baik 03/11/2018, 9:43 AM

## 2018-03-11 NOTE — Progress Notes (Signed)
[facility]                                                                              Patient Demographics  Brian Mclaughlin, is a 72 y.o. male, DOB - [birth year], UJW:[id number]  Admit date - 03/09/2018   Admitting Physician Brian BowJared M Gardner, DO  Outpatient Primary MD for the patient is Patient, No Pcp Per  Outpatient specialists:   LOS - 0  days   Medical records reviewed and are as summarized below:    Chief Complaint  Patient presents with  . Weakness       Brief summary   Brian Mclaughlin is a 72 y.o. male with medical history significant of EtOH abuse, prior stroke, A.Fib, HTN, non-compliant with meds.  Patient presents to the ED with c/o R sided weakness.  This onset at 10pm, lasted ~1 hr or so, resolved in ED.   had similar symptoms in night before the admission.  He also reports " always had right-sided weakness" but then having more dizziness and lightheadedness on the day of admission. Patient had a fever of 101.3 F in the ED, no infectious symptoms.  Chest x-ray negative, MRI negative for acute stroke   Assessment & Plan    Principal Problem:   Right sided weakness in the setting of prior CVAs, patient reports chronic right-sided weakness likely TIA -MRI brain negative for acute stroke, MRA normal, no emergent large vessel occlusion or flow-limiting stenosis, moderate stenosis right cavernous ICA -Carotid Doppler showed 1 to 39% stenosis bilaterally -2D echo showed EF of 40 to 45% with probable hypokinesis of the basal inferior lateral and inferior myocardium -Will place on lisinopril at the time of discharge. -LDL 63, hemoglobin A1c 5.4 -PT evaluation recommended skilled nursing facility, social work consult placed Continue Lipitor, will place on aspirin at the time of discharge as patient refuses to take anticoagulation, likely will be noncompliant  Active Problems:  Fever in ED, ?  UTI -Denies any specific complaints, has diarrhea -No fevers,  blood cultures negative so far, patient was placed on IV Rocephin for UTI, urine culture however showed less than 10,000 colonies -WBC count increased to 17 today, will recheck in a.m  Chronic atrial fibrillation Rate controlled, 2D echo showed EF of 40 to 45% currently on IV heparin drip, was on Xarelto however noncompliant.    HTN (hypertension) BP stable, continue holding BP meds due to concern for possible TIA    CKD (chronic kidney disease) stage 3, GFR 30-59 ml/min (HCC) Chronic, stable  Diarrhea Placed on Imodium as needed  Alcohol use Currently stable, continue Ativan with CIWA protocol, thiamine, folate, multivitamin  Mild DOE/acute systolic CHF Chest x-ray negative for any pulmonary congestion, 2D echo however showed EF of 40 to 45% Check BNP  Code Status: Full DVT Prophylaxis: Heparin drip Family Communication: Discussed in detail with the patient, all imaging results, lab results explained to the patient    Disposition Plan: Needs skilled nursing facility, social work consult placed  Time Spent in minutes 25 minutes  Procedures:  MRI brain, MRA  Consultants:   Neurology  Antimicrobials:  Medications  Scheduled Meds: . atorvastatin  20 mg Oral q1800  . folic acid  1 mg Oral Daily  . multivitamin with minerals  1 tablet Oral Daily  . thiamine  100 mg Oral Daily   Or  . thiamine  100 mg Intravenous Daily   Continuous Infusions: . cefTRIAXone (ROCEPHIN)  IV 1 g (03/11/18 0924)  . heparin 1,200 Units/hr (03/10/18 1758)  . vancomycin     PRN Meds:.acetaminophen **OR** acetaminophen (TYLENOL) oral liquid 160 mg/5 mL **OR** acetaminophen, loperamide, LORazepam **OR** LORazepam   Antibiotics   Anti-infectives (From admission, onward)   Start     Dose/Rate Route Frequency Ordered Stop   03/11/18 0815  cefTRIAXone (ROCEPHIN) 1 g in sodium chloride 0.9 % 100 mL IVPB     1 g 200 mL/hr over 30 Minutes Intravenous Every 24 hours 03/11/18 0802      03/10/18 0115  aztreonam (AZACTAM) 2 g in sodium chloride 0.9 % 100 mL IVPB  Status:  Discontinued     2 g 200 mL/hr over 30 Minutes Intravenous  Once 03/10/18 0107 03/10/18 0109   03/10/18 0115  metroNIDAZOLE (FLAGYL) IVPB 500 mg  Status:  Discontinued     500 mg 100 mL/hr over 60 Minutes Intravenous Every 8 hours 03/10/18 0107 03/10/18 0225   03/10/18 0115  vancomycin (VANCOCIN) IVPB 1000 mg/200 mL premix  Status:  Discontinued     1,000 mg 200 mL/hr over 60 Minutes Intravenous  Once 03/10/18 0107 03/10/18 0109   03/10/18 0115  ceFEPIme (MAXIPIME) 2 g in sodium chloride 0.9 % 100 mL IVPB     2 g 200 mL/hr over 30 Minutes Intravenous  Once 03/10/18 0109 03/10/18 0240   03/10/18 0115  vancomycin (VANCOCIN) 1,500 mg in sodium chloride 0.9 % 500 mL IVPB     1,500 mg 250 mL/hr over 120 Minutes Intravenous  Once 03/10/18 0109          Subjective:   Brian Mclaughlin was seen and examined today.  Feels very weak, having diarrhea, feels somewhat short of breath on exertion.  No nausea vomiting or abdominal pain.    Objective:   Vitals:   03/10/18 2314 03/11/18 0350 03/11/18 0810 03/11/18 1153  BP: 108/72 122/85 122/87 125/88  Pulse: 62 (!) 53 76 60  Resp: 18 18 18 18   Temp: 100 F (37.8 C) 99.7 F (37.6 C) 98.7 F (37.1 C) 98.7 F (37.1 C)  TempSrc: Oral Oral Oral Oral  SpO2: 94% 94% 95% 95%  Weight:      Height:        Intake/Output Summary (Last 24 hours) at 03/11/2018 1255 Last data filed at 03/11/2018 0600 Gross per 24 hour  Intake 384 ml  Output 425 ml  Net -41 ml     Wt Readings from Last 3 Encounters:  03/10/18 101.6 kg  06/14/14 102.5 kg  05/24/14 109 kg   Physical Exam  General: Alert and oriented x 3, NAD  Eyes: ,  HEENT:    Cardiovascular: S1 S2 auscultated, RRR, no pedal edema bilaterally    Respiratory: Decreased breath sound at the bases   Gastrointestinal: Soft, nontender, nondistended, + bowel sounds  Ext: no pedal edema bilaterally  Neuro:  Mild right-sided weakness compared to left  Musculoskeletal: No digital cyanosis, clubbing  Skin: No rashes  Psych: Normal affect and demeanor, alert and oriented x3   Data Reviewed:  I have personally reviewed following labs and imaging studies  Micro Results Recent Results (from the  past 240 hour(s))  Blood Culture (routine x 2)     Status: None (Preliminary result)   Collection Time: 03/10/18  1:45 AM  Result Value Ref Range Status   Specimen Description BLOOD LEFT ARM  Final   Special Requests   Final    BOTTLES DRAWN AEROBIC AND ANAEROBIC Blood Culture adequate volume   Culture   Final    NO GROWTH 1 DAY Performed at [facility], [address], [place], [place] [address]    Report Status PENDING  Incomplete  Blood Culture (routine x 2)     Status: None (Preliminary result)   Collection Time: 03/10/18  2:00 AM  Result Value Ref Range Status   Specimen Description BLOOD LEFT HAND  Final   Special Requests   Final    BOTTLES DRAWN AEROBIC ONLY Blood Culture adequate volume   Culture   Final    NO GROWTH 1 DAY Performed at [facility], [address], [place], [place] [address]    Report Status PENDING  Incomplete  Urine culture     Status: Abnormal   Collection Time: 03/10/18 10:17 AM  Result Value Ref Range Status   Specimen Description URINE, RANDOM  Final   Special Requests NONE  Final   Culture (A)  Final    <10,000 COLONIES/mL INSIGNIFICANT GROWTH Performed at [facility], [address], [place], [place] [address]    Report Status 03/11/2018 FINAL  Final    Radiology Reports Dg Chest 2 View  Result Date: 03/10/2018 CLINICAL DATA:  Confusion and fevers EXAM: CHEST - 2 VIEW COMPARISON:  05/24/2014 FINDINGS: Cardiac shadow is enlarged. Lungs are well aerated bilaterally. Minimal blunting of the left costophrenic angle is noted. No sizable pneumothorax is seen. IMPRESSION: Minimal blunting of left costophrenic angle. Electronically Signed    By: Alcide Clever M.D.   On: 03/10/2018 00:18   Mr Shirlee Latch BJ Contrast  Result Date: 03/10/2018 CLINICAL DATA:  Intermittent RIGHT-sided weakness for 1 day, potentially chronic. History of stroke, alcohol abuse, hypertension and hyperlipidemia. EXAM: MRI HEAD WITHOUT CONTRAST MRA HEAD WITHOUT CONTRAST TECHNIQUE: Multiplanar, multiecho pulse sequences of the brain and surrounding structures were obtained without intravenous contrast. Angiographic images of the head were obtained using MRA technique without contrast. COMPARISON:  MRI/MRA head May 24, 2014 FINDINGS: MRI HEAD FINDINGS INTRACRANIAL CONTENTS: No reduced diffusion to suggest acute ischemia. Scattered chronic microhemorrhages, some of which are new from prior MRI. Small area LEFT mesial occipital lobe encephalomalacia. Mesial LEFT frontal encephalomalacia. Old small LEFT cerebellar infarct. Moderate to severe parenchymal brain volume loss with ex vacuo dilatation lateral ventricles, RIGHT basal ganglia infarct. No hydrocephalus. Prominent basal ganglia perivascular spaces associated chronic small vessel ischemic changes. Confluent supratentorial and patchy pontine white matter FLAIR T2 hyperintensities. No midline shift, mass effect or masses. No abnormal extra-axial fluid collections. VASCULAR: Normal major intracranial vascular flow voids present at skull base. SKULL AND UPPER CERVICAL SPINE: No abnormal sellar expansion. No suspicious calvarial bone marrow signal. Craniocervical junction maintained. SINUSES/ORBITS: Bilateral mastoid effusions. Small RIGHT maxillary mucosal retention cyst. Mild fronto ethmoid mucosal thickening. Included ocular globes and orbital contents are non-suspicious. OTHER: None. MRA HEAD FINDINGS-mildly motion degraded examination. ANTERIOR CIRCULATION: Flow related enhancement of the included cervical, petrous, cavernous and supraclinoid internal carotid arteries. Similar moderate stenosis RIGHT cavernous ICA. Patent  anterior communicating artery. Patent anterior and middle cerebral arteries. No large vessel occlusion, flow limiting stenosis, aneurysm. POSTERIOR CIRCULATION: LEFT vertebral artery is  dominant. Vertebrobasilar arteries are patent, with normal flow related enhancement of the main branch vessels. 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 Patent posterior cerebral arteries. PCOM infundibula. No large vessel occlusion, flow limiting stenosis,  aneurysm. ANATOMIC VARIANTS: Aplastic RIGHT A1 segment. Source images and MIP images were reviewed. IMPRESSION: MRI HEAD: 1. No acute intracranial process. 2. Old LEFT PCA and LEFT ACA territory infarcts. Old RIGHT basal ganglia and LEFT cerebellar infarcts. 3. Severe chronic small vessel ischemic changes. 4. Moderate to severe parenchymal brain volume loss. MRA HEAD: 1. Motion degraded examination. No emergent large vessel occlusion or flow-limiting stenosis. 2. Moderate stenosis RIGHT cavernous ICA. Electronically Signed   By: Awilda Metroourtnay  Bloomer M.D.   On: 03/10/2018 01:43   Mr Brain Wo Contrast  Result Date: 03/10/2018 CLINICAL DATA:  Intermittent RIGHT-sided weakness for 1 day, potentially chronic. History of stroke, alcohol abuse, hypertension and hyperlipidemia. EXAM: MRI HEAD WITHOUT CONTRAST MRA HEAD WITHOUT CONTRAST TECHNIQUE: Multiplanar, multiecho pulse sequences of the brain and surrounding structures were obtained without intravenous contrast. Angiographic images of the head were obtained using MRA technique without contrast. COMPARISON:  MRI/MRA head May 24, 2014 FINDINGS: MRI HEAD FINDINGS INTRACRANIAL CONTENTS: No reduced diffusion to suggest acute ischemia. Scattered chronic microhemorrhages, some of which are new from prior MRI. Small area LEFT mesial occipital lobe encephalomalacia. Mesial LEFT frontal encephalomalacia. Old small LEFT cerebellar infarct. Moderate to severe parenchymal brain volume loss with ex vacuo dilatation  lateral ventricles, RIGHT basal ganglia infarct. No hydrocephalus. Prominent basal ganglia perivascular spaces associated chronic small vessel ischemic changes. Confluent supratentorial and patchy pontine white matter FLAIR T2 hyperintensities. No midline shift, mass effect or masses. No abnormal extra-axial fluid collections. VASCULAR: Normal major intracranial vascular flow voids present at skull base. SKULL AND UPPER CERVICAL SPINE: No abnormal sellar expansion. No suspicious calvarial bone marrow signal. Craniocervical junction maintained. SINUSES/ORBITS: Bilateral mastoid effusions. Small RIGHT maxillary mucosal retention cyst. Mild fronto ethmoid mucosal thickening. Included ocular globes and orbital contents are non-suspicious. OTHER: None. MRA HEAD FINDINGS-mildly motion degraded examination. ANTERIOR CIRCULATION: Flow related enhancement of the included cervical, petrous, cavernous and supraclinoid internal carotid arteries. Similar moderate stenosis RIGHT cavernous ICA. Patent anterior communicating artery. Patent anterior and middle cerebral arteries. No large vessel occlusion, flow limiting stenosis, aneurysm. POSTERIOR CIRCULATION: LEFT vertebral artery is dominant. Vertebrobasilar arteries are patent, with normal flow related enhancement of the main branch vessels. 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 Patent posterior cerebral arteries. PCOM infundibula. No large vessel occlusion, flow limiting stenosis,  aneurysm. ANATOMIC VARIANTS: Aplastic RIGHT A1 segment. Source images and MIP images were reviewed. IMPRESSION: MRI HEAD: 1. No acute intracranial process. 2. Old LEFT PCA and LEFT ACA territory infarcts. Old RIGHT basal ganglia and LEFT cerebellar infarcts. 3. Severe chronic small vessel ischemic changes. 4. Moderate to severe parenchymal brain volume loss. MRA HEAD: 1. Motion degraded examination. No emergent large vessel occlusion or flow-limiting stenosis. 2.  Moderate stenosis RIGHT cavernous ICA. Electronically Signed   By: Awilda Metroourtnay  Bloomer M.D.   On: 03/10/2018 01:43   [facility] [place] [facility]  Result Date: 03/10/2018 Carotid Arterial Duplex Study Indications:       TIA, Weakness and H/O stroke                    New right siged weakness resolved. Risk Factors:      Hypertension. Other Factors:     A-Fib Non compliant with medications ETOH abuse. Comparison Study:  Comparison  exam available Performing Technologist: Toma Deiters RVS  Examination Guidelines: A complete evaluation includes B-mode imaging, spectral Doppler, color Doppler, and power Doppler as needed of all accessible portions of each vessel. Bilateral testing is considered an integral part of a complete examination. Limited examinations for reoccurring indications may be performed as noted.  Right Carotid Findings: +----------+--------+--------+--------+------------+--------------------+           PSV cm/sEDV cm/sStenosisDescribe    Comments             +----------+--------+--------+--------+------------+--------------------+ CCA Prox  65      8                           mild intimal changes +----------+--------+--------+--------+------------+--------------------+ CCA Distal67      14                                               +----------+--------+--------+--------+------------+--------------------+ ICA Prox  39      9       1-39%   heterogenousmild plaque          +----------+--------+--------+--------+------------+--------------------+ ICA Mid   46      16                                               +----------+--------+--------+--------+------------+--------------------+ ICA Distal29      6                           tortuous             +----------+--------+--------+--------+------------+--------------------+ ECA       75      7               heterogenousmild plaque           +----------+--------+--------+--------+------------+--------------------+ +----------+--------+-------+--------+-------------------+           PSV cm/sEDV cmsDescribeArm Pressure (mmHG) +----------+--------+-------+--------+-------------------+ Subclavian114                                        +----------+--------+-------+--------+-------------------+ +---------+--------+--------+------------+ VertebralPSV cm/sEDV cm/sNot assessed +---------+--------+--------+------------+  Left Carotid Findings: +----------+--------+--------+--------+---------------------+------------------+           PSV cm/sEDV cm/sStenosisDescribe             Comments           +----------+--------+--------+--------+---------------------+------------------+ CCA Prox  47      12                                                      +----------+--------+--------+--------+---------------------+------------------+ CCA Distal50      10                                   mild intmal wall  changes            +----------+--------+--------+--------+---------------------+------------------+ ICA Prox  74      15              heterogenous,        mild plaque with                                     homogeneous and      acoustic shadowing                                   irregular                               +----------+--------+--------+--------+---------------------+------------------+ ICA Mid   82      27                                                      +----------+--------+--------+--------+---------------------+------------------+ ICA Distal60      18                                                      +----------+--------+--------+--------+---------------------+------------------+ ECA       90      13                                   mild intimal                                                               changes            +----------+--------+--------+--------+---------------------+------------------+ +----------+--------+--------+--------+-------------------+ SubclavianPSV cm/sEDV cm/sDescribeArm Pressure (mmHG) +----------+--------+--------+--------+-------------------+           98                                          +----------+--------+--------+--------+-------------------+ +---------+--------+--+--------+--+ VertebralPSV cm/s43EDV cm/s11 +---------+--------+--+--------+--+  Summary: Right Carotid: Velocities in the right ICA are consistent with a 1-39% stenosis. Left Carotid: Velocities in the left ICA are consistent with a 1-39% stenosis. Vertebrals:  Left vertebral artery demonstrates antegrade flow. Right vertebral              artery was not visualized. Subclavians: Normal flow hemodynamics were seen in bilateral subclavian              arteries. *See table(s) above for measurements and observations.     Preliminary     Lab Data:  CBC: Recent Labs  Lab 03/09/18 2334 03/11/18 0445  WBC 8.2 17.2*  NEUTROABS 7.9*  --   HGB 15.0 13.1  HCT 45.1 38.9*  MCV 102.7* 100.5*  PLT 99* 76*   Basic Metabolic Panel: Recent Labs  Lab 03/09/18 2334  NA 139  K 3.9  CL 108  CO2 22  GLUCOSE 94  BUN 22  CREATININE 2.21*  CALCIUM 9.1   GFR: Estimated Creatinine Clearance: 37.3 mL/min (A) (by C-G formula based on SCr of 2.21 mg/dL (H)). Liver Function Tests: Recent Labs  Lab 03/09/18 2334  AST 22  ALT 12  ALKPHOS 57  BILITOT 1.2  PROT 7.1  ALBUMIN 3.5   Recent Labs  Lab 03/09/18 2334  LIPASE 24   No results for input(s): AMMONIA in the last 168 hours. Coagulation Profile: Recent Labs  Lab 03/09/18 2334  INR 1.10   Cardiac Enzymes: No results for input(s): CKTOTAL, CKMB, CKMBINDEX, TROPONINI in the last 168 hours. BNP (last 3 results) No results for input(s): PROBNP in the last 8760 hours. HbA1C: Recent Labs    03/10/18 0327  HGBA1C  5.4   CBG: Recent Labs  Lab 03/09/18 2302 03/10/18 2119 03/11/18 0615 03/11/18 1156  GLUCAP 90 91 91 85   Lipid Profile: Recent Labs    03/10/18 0327  CHOL 117  HDL 51  LDLCALC 60  TRIG 30  CHOLHDL 2.3   Thyroid Function Tests: Recent Labs    03/09/18 2335 03/10/18 0327  TSH 10.272*  --   FREET4  --  1.25   Anemia Panel: No results for input(s): VITAMINB12, FOLATE, FERRITIN, TIBC, IRON, RETICCTPCT in the last 72 hours. Urine analysis:    Component Value Date/Time   COLORURINE YELLOW 03/10/2018 1017   APPEARANCEUR CLOUDY (A) 03/10/2018 1017   LABSPEC 1.016 03/10/2018 1017   PHURINE 5.0 03/10/2018 1017   GLUCOSEU NEGATIVE 03/10/2018 1017   HGBUR MODERATE (A) 03/10/2018 1017   BILIRUBINUR NEGATIVE 03/10/2018 1017   KETONESUR NEGATIVE 03/10/2018 1017   PROTEINUR NEGATIVE 03/10/2018 1017   UROBILINOGEN 0.2 05/24/2014 0548   NITRITE POSITIVE (A) 03/10/2018 1017   LEUKOCYTESUR LARGE (A) 03/10/2018 1017     Dejuana Weist M.D. [facility] 03/11/2018, 12:55 PM  Pager: [phone] Between 7am to 7pm - call Pager - [phone]  After 7pm go to [email or web address] - password [email or web address]  Call night coverage person covering after 7pm

## 2018-03-11 NOTE — Progress Notes (Signed)
OT Evaluation  PTA, pt lived alone and was modified independent with ADL and mobility. Pt has R sided weakness at baseline, but states his weakness is "worse than normal". Pt incontinent of BM during session. Pt states "anytime I pee, I poop; I can't control it". Pt with increased SOB with activity, which pt reports is "new". Will follow acutely. At this time recommend rehab at SNF to facilitate safe return to PLOF.  I have discussed the patient's current level of function related to ADL and mobility with the patient (family not present). He acknowledges understanding of this and does not feel he would be able to have his care needs met at home.  He is interested in post-acute rehab in an inpatient setting.     03/11/18 1200  OT Visit Information  Last OT Received On 03/11/18  Assistance Needed +1  History of Present Illness Pt is a 72 y/o male admitted secondary to weakness. MRI was negative for any acute findings. PMH including but not limited to EtOH abuse, prior stroke, A.Fib, HTN, non-compliant with meds.  Precautions  Precautions Fall  Precaution Comments incontinent bowel  Restrictions  Weight Bearing Restrictions No  Home Living  Family/patient expects to be discharged to: Private residence  Living Arrangements Alone  Available Help at Discharge Family;Friend(s);Available PRN/intermittently  Type of Home House  Home Access Stairs to enter  Entrance Stairs-Number of Steps 2  Entrance Stairs-Rails None  Home Layout One level  Bathroom Biomedical scientist No  Home Equipment Dewey - single point  Prior Function  Level of Independence Independent with assistive device(s)  Comments ambulates with use of a cane; does cooking/cleaning  Communication  Communication No difficulties  Pain Assessment  Pain Assessment No/denies pain  Cognition  Arousal/Alertness Awake/alert  Behavior During Therapy Flat affect  Overall  Cognitive Status No family/caregiver present to determine baseline cognitive functioning  Area of Impairment Following commands;Safety/judgement;Problem solving;Attention  Current Attention Level Selective  Following Commands Follows one step commands consistently  Safety/Judgement Decreased awareness of deficits;Decreased awareness of safety  Problem Solving  (delayed processing)  Upper Extremity Assessment  Upper Extremity Assessment RUE deficits/detail  RUE Deficits / Details R sided weakness at baleine form previous CVA; functional but weak grasp; uses as functional assist; k pt staes RUE is "weaker than normal"  Lower Extremity Assessment  Lower Extremity Assessment Defer to PT evaluation  Cervical / Trunk Assessment  Cervical / Trunk Assessment Kyphotic  ADL  Overall ADL's  Needs assistance/impaired  Eating/Feeding Modified independent  Grooming Set up;Sitting  Upper Body Bathing Set up;Sitting  Lower Body Bathing Minimal assistance;Sit to/from stand  Upper Body Dressing  Minimal assistance;Sitting  Lower Body Dressing Minimal assistance;Sit to/from Retail buyer Minimal assistance;Ambulation  Functional mobility during ADLs Minimal assistance;Rolling walker  Bed Mobility  Overal bed mobility Needs Assistance  Bed Mobility Supine to Sit;Sit to Supine  Supine to sit HOB elevated;Supervision  Sit to supine Min guard;HOB elevated (heavy use of rails)  General bed mobility comments greatly increased time and effort, use of bed rail and min guard for safety  Transfers  Overall transfer level Needs assistance  Equipment used Rolling walker (2 wheeled)  Transfers Sit to/from Stand  Sit to Stand From elevated surface;Mod assist  General transfer comment increased time; significatn anterior weight shift to stand; unsteady " I feel like I'm going to fall"  Balance  Overall balance assessment Needs assistance  Sitting-balance support Feet supported  Sitting balance-Leahy  Scale Fair  Standing balance support Bilateral upper extremity supported  Standing balance-Leahy Scale Poor  OT - End of Session  Equipment Utilized During Treatment Gait belt;Rolling walker  Activity Tolerance Patient tolerated treatment well  Patient left in bed;with call bell/phone within reach;with bed alarm set  Nurse Communication Mobility status  OT Assessment  OT Recommendation/Assessment Patient needs continued OT Services  OT Visit Diagnosis Unsteadiness on feet (R26.81);Muscle weakness (generalized) (M62.81);Other symptoms and signs involving cognitive function  OT Problem List Decreased strength;Decreased activity tolerance;Impaired balance (sitting and/or standing);Decreased safety awareness;Decreased knowledge of use of DME or AE;Impaired tone;Decreased coordination;Impaired UE functional use  OT Plan  OT Frequency (ACUTE ONLY) Min 2X/week  OT Treatment/Interventions (ACUTE ONLY) Self-care/ADL training;DME and/or AE instruction;Therapeutic activities;Cognitive remediation/compensation;Patient/family education;Balance training  AM-PAC OT "6 Clicks" Daily Activity Outcome Measure (Version 2)  Help from another person eating meals? 4  Help from another person taking care of personal grooming? 4  Help from another person toileting, which includes using toliet, bedpan, or urinal? 3  Help from another person bathing (including washing, rinsing, drying)? 3  Help from another person to put on and taking off regular upper body clothing? 3  Help from another person to put on and taking off regular lower body clothing? 3  6 Click Score 20  OT Recommendation  Follow Up Recommendations SNF;Supervision/Assistance - 24 hour  OT Equipment None recommended by OT  Individuals Consulted  Consulted and Agree with Results and Recommendations Patient  Acute Rehab OT Goals  Patient Stated Goal "to feel better"  OT Goal Formulation With patient  Time For Goal Achievement 03/25/18  Potential to  Achieve Goals Good  OT Time Calculation  OT Start Time (ACUTE ONLY) 1222  OT Stop Time (ACUTE ONLY) 1239  OT Time Calculation (min) 17 min  OT General Charges  $OT Visit 1 Visit  OT Evaluation  $OT Eval Moderate Complexity 1 Mod  Written Expression  Dominant Hand Left (R sided weakness from CVA)  Maurie Boettcher, OT/L   Acute OT Clinical Specialist [place] Pager [phone] Office [phone]

## 2018-03-12 DIAGNOSIS — R531 Weakness: Secondary | ICD-10-CM | POA: Diagnosis not present

## 2018-03-12 DIAGNOSIS — I482 Chronic atrial fibrillation, unspecified: Secondary | ICD-10-CM | POA: Diagnosis not present

## 2018-03-12 DIAGNOSIS — F1010 Alcohol abuse, uncomplicated: Secondary | ICD-10-CM | POA: Diagnosis not present

## 2018-03-12 DIAGNOSIS — R69 Illness, unspecified: Secondary | ICD-10-CM | POA: Diagnosis not present

## 2018-03-12 DIAGNOSIS — N179 Acute kidney failure, unspecified: Secondary | ICD-10-CM | POA: Diagnosis not present

## 2018-03-12 LAB — BASIC METABOLIC PANEL
Anion gap: 10 (ref 5–15)
BUN: 26 mg/dL — ABNORMAL HIGH (ref 8–23)
CHLORIDE: 101 mmol/L (ref 98–111)
CO2: 23 mmol/L (ref 22–32)
Calcium: 8.5 mg/dL — ABNORMAL LOW (ref 8.9–10.3)
Creatinine, Ser: 2.11 mg/dL — ABNORMAL HIGH (ref 0.61–1.24)
GFR calc Af Amer: 35 mL/min — ABNORMAL LOW (ref >60)
GFR calc non Af Amer: 30 mL/min — ABNORMAL LOW (ref >60)
Glucose, Bld: 85 mg/dL (ref 70–99)
Potassium: 4.1 mmol/L (ref 3.5–5.1)
Sodium: 134 mmol/L — ABNORMAL LOW (ref 135–145)

## 2018-03-12 LAB — CBC
HCT: 39 % (ref 39.0–52.0)
Hemoglobin: 12.9 g/dL — ABNORMAL LOW (ref 13.0–17.0)
MCH: 32.7 pg (ref 26.0–34.0)
MCHC: 33.1 g/dL (ref 30.0–36.0)
MCV: 99 fL (ref 80.0–100.0)
Platelets: 75 10*3/uL — ABNORMAL LOW (ref 150–400)
RBC: 3.94 MIL/uL — ABNORMAL LOW (ref 4.22–5.81)
RDW: 12.3 % (ref 11.5–15.5)
WBC: 12.2 10*3/uL — ABNORMAL HIGH (ref 4.0–10.5)
nRBC: 0 % (ref 0.0–0.2)

## 2018-03-12 LAB — GLUCOSE, CAPILLARY
Glucose-Capillary: 81 mg/dL (ref 70–99)
Glucose-Capillary: 86 mg/dL (ref 70–99)

## 2018-03-12 LAB — HEPARIN LEVEL (UNFRACTIONATED)
Heparin Unfractionated: <0.1 IU/mL — ABNORMAL LOW (ref 0.30–0.70)
Heparin Unfractionated: 0.22 IU/mL — ABNORMAL LOW (ref 0.30–0.70)

## 2018-03-12 MED ORDER — THIAMINE HCL 100 MG PO TABS: 100.0000 mg | ORAL_TABLET | Freq: Every day | ORAL | 3 refills | Status: DC

## 2018-03-12 MED ORDER — CARVEDILOL 3.125 MG PO TABS
3.1250 mg | ORAL_TABLET | Freq: Two times a day (BID) | ORAL | Status: DC
  Administered 2018-03-12: 3.125 mg via ORAL
  Filled 2018-03-12: qty 1

## 2018-03-12 MED ORDER — ATORVASTATIN CALCIUM 20 MG PO TABS: 20.0000 mg | ORAL_TABLET | Freq: Every day | ORAL | 3 refills | Status: DC

## 2018-03-12 MED ORDER — HYDRALAZINE HCL 50 MG PO TABS
50.0000 mg | ORAL_TABLET | Freq: Three times a day (TID) | ORAL | Status: DC
  Administered 2018-03-12 (×2): 50 mg via ORAL
  Filled 2018-03-12 (×2): qty 1

## 2018-03-12 MED ORDER — LOPERAMIDE HCL 2 MG PO CAPS
4.0000 mg | ORAL_CAPSULE | Freq: Once | ORAL | Status: AC
  Administered 2018-03-12: 4 mg via ORAL
  Filled 2018-03-12: qty 2

## 2018-03-12 MED ORDER — ASPIRIN EC 325 MG PO TBEC: 325.0000 mg | DELAYED_RELEASE_TABLET | Freq: Every day | ORAL | 11 refills | Status: AC

## 2018-03-12 MED ORDER — HYDRALAZINE HCL 50 MG PO TABS: 50.0000 mg | ORAL_TABLET | Freq: Three times a day (TID) | ORAL | 3 refills | Status: DC

## 2018-03-12 MED ORDER — LOPERAMIDE HCL 2 MG PO CAPS: 2.0000 mg | ORAL_CAPSULE | ORAL | 0 refills | Status: DC | PRN

## 2018-03-12 MED ORDER — CARVEDILOL 3.125 MG PO TABS: 3.1250 mg | ORAL_TABLET | Freq: Two times a day (BID) | ORAL | 3 refills | Status: DC

## 2018-03-12 NOTE — Progress Notes (Signed)
IV and tele discontinued without complication. Patient discharge education provided to patient and friend Synetta Failnita. Pt. States he doesn't like taking medicine but educated on importance of keeping follow up appointments and filling paper prescriptions which were sent with the patient at discharge. Pt. Discharged from facility via wheelchair.

## 2018-03-12 NOTE — Progress Notes (Signed)
ANTICOAGULATION CONSULT NOTE - Follow Up Consult  Pharmacy Consult for heparin Indication: atrial fibrillation  Labs: Recent Labs    03/09/18 2334  03/10/18 2022 03/11/18 0445 03/12/18 0428  HGB 15.0  --   --  13.1 12.9*  HCT 45.1  --   --  38.9* 39.0  PLT 99*  --   --  76* 75*  LABPROT 14.2  --   --   --   --   INR 1.10  --   --   --   --   HEPARINUNFRC  --    < > 0.54 0.33 <0.10*  CREATININE 2.21*  --   --   --  2.11*   < > = values in this interval not displayed.    Assessment: 72yo male now subtherapeutic on heparin after two levels at goal though had been trending down; Plt low but stable since yesterday.  Goal of Therapy:  Heparin level 0.3-0.7 units/ml   Plan:  Will increase heparin gtt by 3 units/kg/hr to 1500 units/hr and check level in 8 hours.    Vernard GamblesVeronda Ritha Sampedro, PharmD, BCPS  03/12/2018,6:13 AM

## 2018-03-12 NOTE — Discharge Summary (Signed)
Physician Discharge Summary   Patient ID: Brian Mclaughlin MRN: [id number] DOB/AGE: 07-22-1945 72 y.o.  Admit date: 03/09/2018 Discharge date: 03/12/2018  Primary Care Physician:  Patient, No Pcp Per   Recommendations for Outpatient Follow-up:  1. Follow up with PCP in 1-2 weeks 2. Patient refuses to be on anticoagulation, placed on aspirin 325 mg daily 3. Patient refused skilled nursing facility or having repeat PT evaluation prior to discharge  Home Health: Home health PT OT Equipment/Devices: None  Discharge Condition: stable  CODE STATUS: FULL  Diet recommendation: Heart healthy diet   Discharge Diagnoses:     Right-sided weakness acute on chronic in the setting of prior CVAs . HTN (hypertension) . Chronic Atrial fibrillation (HCC), refuses anticoagulation . Diarrhea resolved . CKD (chronic kidney disease) stage 3, GFR 30-59 ml/min (HCC) . Medical noncompliance Generalized debility Hyperlipidemia  Consults:   Neurology   Allergies:   Allergies  Allergen Reactions  . Codeine Rash  . Penicillins Rash     DISCHARGE MEDICATIONS: Allergies as of 03/12/2018      Reactions   Codeine Rash   Penicillins Rash      Medication List    TAKE these medications   aspirin EC 325 MG tablet Take 1 tablet (325 mg total) by mouth daily.   atorvastatin 20 MG tablet Commonly known as:  LIPITOR Take 1 tablet (20 mg total) by mouth at bedtime. What changed:  when to take this   carvedilol 3.125 MG tablet Commonly known as:  COREG Take 1 tablet (3.125 mg total) by mouth 2 (two) times daily with a meal.   hydrALAZINE 50 MG tablet Commonly known as:  APRESOLINE Take 1 tablet (50 mg total) by mouth 3 (three) times daily. What changed:  when to take this   loperamide 2 MG capsule Commonly known as:  IMODIUM Take 1 capsule (2 mg total) by mouth as needed for diarrhea or loose stools.   thiamine 100 MG tablet Take 1 tablet (100 mg total) by mouth daily.         Brief H and P: For complete details please refer to admission H and P, but in brief Brian Mclaughlin a 71 y.o.malewith medical history significant ofEtOH abuse, prior stroke, A.Fib, HTN, non-compliant with meds. Patient presents to the ED with c/o R sided weakness. This onset at 10pm, lasted ~1 hr or so, resolved in ED.  had similar symptoms in night before the admission.  He also reports " always had right-sided weakness" but then having more dizziness and lightheadedness on the day of admission. Patient had a fever of 101.3 F in the ED, no infectious symptoms.  Chest x-ray negative, MRI negative for acute stroke   Hospital Course:   Right sided weakness in the setting of prior CVAs, patient reports chronic right-sided weakness likely TIA -MRI brain negative for acute stroke, MRA normal, no emergent large vessel occlusion or flow-limiting stenosis, moderate stenosis right cavernous ICA -Carotid Doppler showed 1 to 39% stenosis bilaterally -2D echo showed EF of 40 to 45% with probable hypokinesis of the basal inferior lateral and inferior myocardium -Restart Coreg, hydralazine -LDL 63, hemoglobin A1c 5.4 -PT evaluation recommended skilled nursing facility, social work consult was placed.  This morning patient refuses skilled nursing facility and wants to go home.  He refuses to see physical therapy for repeat evaluation.  Patient was discharged rather than leaving AMA as he needs the medications. Will arrange home health PT OT Patient was continued on IV heparin  drip while inpatient.  He adamantly refuses to be on anticoagulation outpatient.  Placed on full dose aspirin 325 mg daily  Fever in ED, ?  UTI -Denies any specific complaints, has diarrhea which is now resolved -No fevers, blood cultures negative so far, patient was placed on IV Rocephin for UTI, urine culture however showed less than 10,000 colonies -Remained afebrile in the last 48 hours, white count trending down, urine  culture was negative hence no further need of antibiotics  Chronic atrial fibrillation Rate controlled, 2D echo showed EF of 40 to 45% currently on IV heparin drip, was on Xarelto however noncompliant.    HTN (hypertension) Restart antihypertensive, Coreg, hydralazine    CKD (chronic kidney disease) stage 3, GFR 30-59 ml/min (HCC) Chronic, stable  Diarrhea Placed on Imodium as needed, now resolved  Alcohol use Able, patient was placed on Ativan with CIWA protocol, thiamine, folate, multivitamin.  Patient was counseled strongly to quit alcohol use.    Mild DOE/acute systolic CHF Chest x-ray negative for any pulmonary congestion, 2D echo however showed EF of 40 to 45% Outpatient follow-up with PCP   Day of Discharge S: Wants to go home  BP (!) 166/113 (BP Location: Right Arm)   Pulse 77   Temp 99.8 F (37.7 C) (Oral)   Resp 16   Ht 6' (1.829 m)   Wt 101.6 kg   SpO2 92%   BMI 30.38 kg/m   Physical Exam: General: Alert and awake oriented x3 not in any acute distress. HEENT: anicteric sclera, pupils reactive to light and accommodation CVS: S1-S2 clear no murmur rubs or gallops Chest: clear to auscultation bilaterally, no wheezing rales or rhonchi Abdomen: soft nontender, nondistended, normal bowel sounds Extremities: no cyanosis, clubbing or edema noted bilaterally Neuro: No new deficits   The results of significant diagnostics from this hospitalization (including imaging, microbiology, ancillary and laboratory) are listed below for reference.      Procedures/Studies:  Dg Chest 2 View  Result Date: 03/10/2018 CLINICAL DATA:  Confusion and fevers EXAM: CHEST - 2 VIEW COMPARISON:  05/24/2014 FINDINGS: Cardiac shadow is enlarged. Lungs are well aerated bilaterally. Minimal blunting of the left costophrenic angle is noted. No sizable pneumothorax is seen. IMPRESSION: Minimal blunting of left costophrenic angle. Electronically Signed   By: Alcide Clever M.D.   On:  03/10/2018 00:18   Mr Shirlee Latch ZO Contrast  Result Date: 03/10/2018 CLINICAL DATA:  Intermittent RIGHT-sided weakness for 1 day, potentially chronic. History of stroke, alcohol abuse, hypertension and hyperlipidemia. EXAM: MRI HEAD WITHOUT CONTRAST MRA HEAD WITHOUT CONTRAST TECHNIQUE: Multiplanar, multiecho pulse sequences of the brain and surrounding structures were obtained without intravenous contrast. Angiographic images of the head were obtained using MRA technique without contrast. COMPARISON:  MRI/MRA head May 24, 2014 FINDINGS: MRI HEAD FINDINGS INTRACRANIAL CONTENTS: No reduced diffusion to suggest acute ischemia. Scattered chronic microhemorrhages, some of which are new from prior MRI. Small area LEFT mesial occipital lobe encephalomalacia. Mesial LEFT frontal encephalomalacia. Old small LEFT cerebellar infarct. Moderate to severe parenchymal brain volume loss with ex vacuo dilatation lateral ventricles, RIGHT basal ganglia infarct. No hydrocephalus. Prominent basal ganglia perivascular spaces associated chronic small vessel ischemic changes. Confluent supratentorial and patchy pontine white matter FLAIR T2 hyperintensities. No midline shift, mass effect or masses. No abnormal extra-axial fluid collections. VASCULAR: Normal major intracranial vascular flow voids present at skull base. SKULL AND UPPER CERVICAL SPINE: No abnormal sellar expansion. No suspicious calvarial bone marrow signal. Craniocervical junction maintained. SINUSES/ORBITS: Bilateral mastoid  effusions. Small RIGHT maxillary mucosal retention cyst. Mild fronto ethmoid mucosal thickening. Included ocular globes and orbital contents are non-suspicious. OTHER: None. MRA HEAD FINDINGS-mildly motion degraded examination. ANTERIOR CIRCULATION: Flow related enhancement of the included cervical, petrous, cavernous and supraclinoid internal carotid arteries. Similar moderate stenosis RIGHT cavernous ICA. Patent anterior communicating artery.  Patent anterior and middle cerebral arteries. No large vessel occlusion, flow limiting stenosis, aneurysm. POSTERIOR CIRCULATION: LEFT vertebral artery is dominant. Vertebrobasilar arteries are patent, with normal flow related enhancement of the main branch vessels. 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 Patent posterior cerebral arteries. PCOM infundibula. No large vessel occlusion, flow limiting stenosis,  aneurysm. ANATOMIC VARIANTS: Aplastic RIGHT A1 segment. Source images and MIP images were reviewed. IMPRESSION: MRI HEAD: 1. No acute intracranial process. 2. Old LEFT PCA and LEFT ACA territory infarcts. Old RIGHT basal ganglia and LEFT cerebellar infarcts. 3. Severe chronic small vessel ischemic changes. 4. Moderate to severe parenchymal brain volume loss. MRA HEAD: 1. Motion degraded examination. No emergent large vessel occlusion or flow-limiting stenosis. 2. Moderate stenosis RIGHT cavernous ICA. Electronically Signed   By: Awilda Metro M.D.   On: 03/10/2018 01:43   Mr Brain Wo Contrast  Result Date: 03/10/2018 CLINICAL DATA:  Intermittent RIGHT-sided weakness for 1 day, potentially chronic. History of stroke, alcohol abuse, hypertension and hyperlipidemia. EXAM: MRI HEAD WITHOUT CONTRAST MRA HEAD WITHOUT CONTRAST TECHNIQUE: Multiplanar, multiecho pulse sequences of the brain and surrounding structures were obtained without intravenous contrast. Angiographic images of the head were obtained using MRA technique without contrast. COMPARISON:  MRI/MRA head May 24, 2014 FINDINGS: MRI HEAD FINDINGS INTRACRANIAL CONTENTS: No reduced diffusion to suggest acute ischemia. Scattered chronic microhemorrhages, some of which are new from prior MRI. Small area LEFT mesial occipital lobe encephalomalacia. Mesial LEFT frontal encephalomalacia. Old small LEFT cerebellar infarct. Moderate to severe parenchymal brain volume loss with ex vacuo dilatation lateral ventricles, RIGHT basal  ganglia infarct. No hydrocephalus. Prominent basal ganglia perivascular spaces associated chronic small vessel ischemic changes. Confluent supratentorial and patchy pontine white matter FLAIR T2 hyperintensities. No midline shift, mass effect or masses. No abnormal extra-axial fluid collections. VASCULAR: Normal major intracranial vascular flow voids present at skull base. SKULL AND UPPER CERVICAL SPINE: No abnormal sellar expansion. No suspicious calvarial bone marrow signal. Craniocervical junction maintained. SINUSES/ORBITS: Bilateral mastoid effusions. Small RIGHT maxillary mucosal retention cyst. Mild fronto ethmoid mucosal thickening. Included ocular globes and orbital contents are non-suspicious. OTHER: None. MRA HEAD FINDINGS-mildly motion degraded examination. ANTERIOR CIRCULATION: Flow related enhancement of the included cervical, petrous, cavernous and supraclinoid internal carotid arteries. Similar moderate stenosis RIGHT cavernous ICA. Patent anterior communicating artery. Patent anterior and middle cerebral arteries. No large vessel occlusion, flow limiting stenosis, aneurysm. POSTERIOR CIRCULATION: LEFT vertebral artery is dominant. Vertebrobasilar arteries are patent, with normal flow related enhancement of the main branch vessels. 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 Patent posterior cerebral arteries. PCOM infundibula. No large vessel occlusion, flow limiting stenosis,  aneurysm. ANATOMIC VARIANTS: Aplastic RIGHT A1 segment. Source images and MIP images were reviewed. IMPRESSION: MRI HEAD: 1. No acute intracranial process. 2. Old LEFT PCA and LEFT ACA territory infarcts. Old RIGHT basal ganglia and LEFT cerebellar infarcts. 3. Severe chronic small vessel ischemic changes. 4. Moderate to severe parenchymal brain volume loss. MRA HEAD: 1. Motion degraded examination. No emergent large vessel occlusion or flow-limiting stenosis. 2. Moderate stenosis RIGHT cavernous  ICA. Electronically Signed   By: Awilda Metro M.D.   On: 03/10/2018 01:43   Vas US  Carotid (at [facility])  Result Date: 03/11/2018 Carotid Arterial Duplex Study Indications:       TIA, Weakness and H/O stroke                    New right siged weakness resolved. Risk Factors:      Hypertension. Other Factors:     A-Fib Non compliant with medications ETOH abuse. Comparison Study:  Comparison exam available Performing Technologist: Toma Deiters RVS  Examination Guidelines: A complete evaluation includes B-mode imaging, spectral Doppler, color Doppler, and power Doppler as needed of all accessible portions of each vessel. Bilateral testing is considered an integral part of a complete examination. Limited examinations for reoccurring indications may be performed as noted.  Right Carotid Findings: +----------+--------+--------+--------+------------+--------------------+           PSV cm/sEDV cm/sStenosisDescribe    Comments             +----------+--------+--------+--------+------------+--------------------+ CCA Prox  65      8                           mild intimal changes +----------+--------+--------+--------+------------+--------------------+ CCA Distal67      14                                               +----------+--------+--------+--------+------------+--------------------+ ICA Prox  39      9       1-39%   heterogenousmild plaque          +----------+--------+--------+--------+------------+--------------------+ ICA Mid   46      16                                               +----------+--------+--------+--------+------------+--------------------+ ICA Distal29      6                           tortuous             +----------+--------+--------+--------+------------+--------------------+ ECA       75      7               heterogenousmild plaque          +----------+--------+--------+--------+------------+--------------------+  +----------+--------+-------+--------+-------------------+           PSV cm/sEDV cmsDescribeArm Pressure (mmHG) +----------+--------+-------+--------+-------------------+ Subclavian114                                        +----------+--------+-------+--------+-------------------+ +---------+--------+--------+------------+ VertebralPSV cm/sEDV cm/sNot assessed +---------+--------+--------+------------+  Left Carotid Findings: +----------+--------+--------+--------+---------------------+------------------+           PSV cm/sEDV cm/sStenosisDescribe             Comments           +----------+--------+--------+--------+---------------------+------------------+ CCA Prox  47      12                                                      +----------+--------+--------+--------+---------------------+------------------+  CCA Distal50      10                                   mild intmal wall                                                          changes            +----------+--------+--------+--------+---------------------+------------------+ ICA Prox  74      15              heterogenous,        mild plaque with                                     homogeneous and      acoustic shadowing                                   irregular                               +----------+--------+--------+--------+---------------------+------------------+ ICA Mid   82      27                                                      +----------+--------+--------+--------+---------------------+------------------+ ICA Distal60      18                                                      +----------+--------+--------+--------+---------------------+------------------+ ECA       90      13                                   mild intimal                                                              changes             +----------+--------+--------+--------+---------------------+------------------+ +----------+--------+--------+--------+-------------------+ SubclavianPSV cm/sEDV cm/sDescribeArm Pressure (mmHG) +----------+--------+--------+--------+-------------------+           98                                          +----------+--------+--------+--------+-------------------+ +---------+--------+--+--------+--+ VertebralPSV cm/s43EDV cm/s11 +---------+--------+--+--------+--+  Summary: Right Carotid: Velocities in the right ICA are consistent with a 1-39% stenosis. Left Carotid: Velocities in the left ICA are  consistent with a 1-39% stenosis. Vertebrals:  Left vertebral artery demonstrates antegrade flow. Right vertebral              artery was not visualized. Subclavians: Normal flow hemodynamics were seen in bilateral subclavian              arteries. *See table(s) above for measurements and observations.  Electronically signed by Sherald Hess MD on 03/11/2018 at 2:04:05 PM.    Final       LAB RESULTS: Basic Metabolic Panel: Recent Labs  Lab 03/09/18 2334 03/12/18 0428  NA 139 134*  K 3.9 4.1  CL 108 101  CO2 22 23  GLUCOSE 94 85  BUN 22 26*  CREATININE 2.21* 2.11*  CALCIUM 9.1 8.5*   Liver Function Tests: Recent Labs  Lab 03/09/18 2334  AST 22  ALT 12  ALKPHOS 57  BILITOT 1.2  PROT 7.1  ALBUMIN 3.5   Recent Labs  Lab 03/09/18 2334  LIPASE 24   No results for input(s): AMMONIA in the last 168 hours. CBC: Recent Labs  Lab 03/09/18 2334 03/11/18 0445 03/12/18 0428  WBC 8.2 17.2* 12.2*  NEUTROABS 7.9*  --   --   HGB 15.0 13.1 12.9*  HCT 45.1 38.9* 39.0  MCV 102.7* 100.5* 99.0  PLT 99* 76* 75*   Cardiac Enzymes: No results for input(s): CKTOTAL, CKMB, CKMBINDEX, TROPONINI in the last 168 hours. BNP: Invalid input(s): POCBNP CBG: Recent Labs  Lab 03/11/18 2134 03/12/18 0611  GLUCAP 80 81      Disposition and Follow-up: Discharge Instructions     Diet - low sodium heart healthy   Complete by:  As directed    Increase activity slowly   Complete by:  As directed        DISPOSITION: home with home health PT OT   DISCHARGE FOLLOW-UP Follow-up Information    York Spaniel, MD. Schedule an appointment as soon as possible for a visit in 2 week(s).   Specialty:  Neurology Contact information: [address] [place] [address] [phone]        [facility]. Schedule an appointment as soon as possible for a visit in 2 week(s).   Contact information: [address] [place] [address] [phone]           Time coordinating discharge:    Signed:   Thad Ranger M.D. [facility] 03/12/2018, 11:14 AM Pager: [phone]

## 2018-03-12 NOTE — Progress Notes (Signed)
Pt. States that he does not want to go to a skilled nursing facility, and he will not go. He wishes to return home. Patient also refuses to be assessed by physical therapy stating that he doesn't think he needs it. Patient was educated on the purpose of physical therapy assessment but still refuses. Dr. Isidoro Donningai aware. Will continue to monitor patient.

## 2018-03-12 NOTE — Progress Notes (Signed)
CSW acknowledging PT recommendation for SNF, however patient is refusing and will be discharging home with home health.  CSW signing off.  Blenda NicelyElizabeth Saphire Barnhart, [place] Clinical Social Worker [phone]

## 2018-03-12 NOTE — Care Management Note (Signed)
Case Management Note  Patient Details  Name: Brian Mclaughlin MRN: [id number] Date of Birth: 1946-03-01  Subjective/Objective:     Pt admitted with rt sided weakness. He is from home with a friend.  DME: walker and cane No PcP.                Action/Plan: [facility] PT/OT recommending SNF rehab but patient is refusing.  CM provided the patient choice and [facility] selected. CM called Elnita Maxwellheryl with [facility] and she accepted the referral. CM inquired about a PcP and [facility] has a Dr Teola BradleyBarr that will make house calls and see the patient. Elnita MaxwellCheryl will make him an appointment with the MD.  Pts friend to provide transport home.    Expected Discharge Date:  03/12/18               Expected Discharge Plan:  Home w Home Health Services  In-House Referral:     Discharge planning Services  CM Consult  Post Acute Care Choice:  Home Health Choice offered to:  Patient  DME Arranged:    DME Agency:     HH Arranged:  PT, OT HH Agency[facility]  Status of Service:  Completed, signed off  If discussed at Long Length of Stay Meetings, dates discussed:    Additional Comments:  Kermit BaloKelli F Chessa Barrasso, RN 03/12/2018, 11:51 AM

## 2018-03-15 LAB — CULTURE, BLOOD (ROUTINE X 2)
Culture: NO GROWTH
Culture: NO GROWTH
Special Requests: ADEQUATE
Special Requests: ADEQUATE

## 2018-08-13 DIAGNOSIS — H6123 Impacted cerumen, bilateral: Secondary | ICD-10-CM | POA: Diagnosis not present

## 2018-08-13 DIAGNOSIS — H938X3 Other specified disorders of ear, bilateral: Secondary | ICD-10-CM | POA: Diagnosis not present

## 2019-07-25 ENCOUNTER — Inpatient Hospital Stay (HOSPITAL_COMMUNITY)
Admission: EM | Admit: 2019-07-25 | Discharge: 2019-07-25 | DRG: 605 | Discharge status: Against Medical Advice | Payer: Medicare HMO | Attending: Internal Medicine | Admitting: Internal Medicine

## 2019-07-25 ENCOUNTER — Encounter (HOSPITAL_COMMUNITY): Payer: Self-pay | Admitting: Emergency Medicine

## 2019-07-25 ENCOUNTER — Emergency Department (HOSPITAL_COMMUNITY): Payer: Medicare HMO

## 2019-07-25 DIAGNOSIS — Z03818 Encounter for observation for suspected exposure to other biological agents ruled out: Secondary | ICD-10-CM | POA: Diagnosis not present

## 2019-07-25 DIAGNOSIS — I5022 Chronic systolic (congestive) heart failure: Secondary | ICD-10-CM | POA: Diagnosis present

## 2019-07-25 DIAGNOSIS — M25561 Pain in right knee: Secondary | ICD-10-CM | POA: Diagnosis not present

## 2019-07-25 DIAGNOSIS — Z79899 Other long term (current) drug therapy: Secondary | ICD-10-CM | POA: Diagnosis not present

## 2019-07-25 DIAGNOSIS — N183 Chronic kidney disease, stage 3 unspecified: Secondary | ICD-10-CM | POA: Diagnosis not present

## 2019-07-25 DIAGNOSIS — E785 Hyperlipidemia, unspecified: Secondary | ICD-10-CM | POA: Diagnosis present

## 2019-07-25 DIAGNOSIS — I130 Hypertensive heart and chronic kidney disease with heart failure and stage 1 through stage 4 chronic kidney disease, or unspecified chronic kidney disease: Secondary | ICD-10-CM | POA: Diagnosis present

## 2019-07-25 DIAGNOSIS — I4891 Unspecified atrial fibrillation: Secondary | ICD-10-CM | POA: Diagnosis not present

## 2019-07-25 DIAGNOSIS — Y92009 Unspecified place in unspecified non-institutional (private) residence as the place of occurrence of the external cause: Secondary | ICD-10-CM

## 2019-07-25 DIAGNOSIS — L03115 Cellulitis of right lower limb: Secondary | ICD-10-CM | POA: Diagnosis not present

## 2019-07-25 DIAGNOSIS — Z8673 Personal history of transient ischemic attack (TIA), and cerebral infarction without residual deficits: Secondary | ICD-10-CM

## 2019-07-25 DIAGNOSIS — S81001A Unspecified open wound, right knee, initial encounter: Principal | ICD-10-CM | POA: Diagnosis present

## 2019-07-25 DIAGNOSIS — Z888 Allergy status to other drugs, medicaments and biological substances status: Secondary | ICD-10-CM

## 2019-07-25 DIAGNOSIS — R601 Generalized edema: Secondary | ICD-10-CM | POA: Diagnosis not present

## 2019-07-25 DIAGNOSIS — Z885 Allergy status to narcotic agent status: Secondary | ICD-10-CM

## 2019-07-25 DIAGNOSIS — S8991XA Unspecified injury of right lower leg, initial encounter: Secondary | ICD-10-CM | POA: Diagnosis not present

## 2019-07-25 DIAGNOSIS — W19XXXA Unspecified fall, initial encounter: Secondary | ICD-10-CM | POA: Diagnosis not present

## 2019-07-25 DIAGNOSIS — Z87891 Personal history of nicotine dependence: Secondary | ICD-10-CM | POA: Diagnosis not present

## 2019-07-25 DIAGNOSIS — R609 Edema, unspecified: Secondary | ICD-10-CM | POA: Diagnosis not present

## 2019-07-25 DIAGNOSIS — I491 Atrial premature depolarization: Secondary | ICD-10-CM | POA: Diagnosis not present

## 2019-07-25 DIAGNOSIS — M79606 Pain in leg, unspecified: Secondary | ICD-10-CM | POA: Diagnosis present

## 2019-07-25 LAB — CBC WITH DIFFERENTIAL/PLATELET
Abs Immature Granulocytes: 0.03 10*3/uL (ref 0.00–0.07)
Basophils Absolute: 0.1 10*3/uL (ref 0.0–0.1)
Basophils Relative: 1 %
Eosinophils Absolute: 0.1 10*3/uL (ref 0.0–0.5)
Eosinophils Relative: 1 %
HCT: 35.8 % — ABNORMAL LOW (ref 39.0–52.0)
Hemoglobin: 11.7 g/dL — ABNORMAL LOW (ref 13.0–17.0)
Immature Granulocytes: 0 %
Lymphocytes Relative: 23 %
Lymphs Abs: 1.9 10*3/uL (ref 0.7–4.0)
MCH: 34.2 pg — ABNORMAL HIGH (ref 26.0–34.0)
MCHC: 32.7 g/dL (ref 30.0–36.0)
MCV: 104.7 fL — ABNORMAL HIGH (ref 80.0–100.0)
Monocytes Absolute: 1.1 10*3/uL — ABNORMAL HIGH (ref 0.1–1.0)
Monocytes Relative: 13 %
Neutro Abs: 5.2 10*3/uL (ref 1.7–7.7)
Neutrophils Relative %: 62 %
Platelets: 176 10*3/uL (ref 150–400)
RBC: 3.42 MIL/uL — ABNORMAL LOW (ref 4.22–5.81)
RDW: 13 % (ref 11.5–15.5)
WBC: 8.4 10*3/uL (ref 4.0–10.5)
nRBC: 0 % (ref 0.0–0.2)

## 2019-07-25 LAB — COMPREHENSIVE METABOLIC PANEL
ALT: 11 U/L (ref 0–44)
AST: 15 U/L (ref 15–41)
Albumin: 3.4 g/dL — ABNORMAL LOW (ref 3.5–5.0)
Alkaline Phosphatase: 61 U/L (ref 38–126)
Anion gap: 8 (ref 5–15)
BUN: 23 mg/dL (ref 8–23)
CO2: 25 mmol/L (ref 22–32)
Calcium: 8.8 mg/dL — ABNORMAL LOW (ref 8.9–10.3)
Chloride: 109 mmol/L (ref 98–111)
Creatinine, Ser: 1.88 mg/dL — ABNORMAL HIGH (ref 0.61–1.24)
GFR calc Af Amer: 40 mL/min — ABNORMAL LOW (ref >60)
GFR calc non Af Amer: 34 mL/min — ABNORMAL LOW (ref >60)
Glucose, Bld: 101 mg/dL — ABNORMAL HIGH (ref 70–99)
Potassium: 4.3 mmol/L (ref 3.5–5.1)
Sodium: 142 mmol/L (ref 135–145)
Total Bilirubin: 1.5 mg/dL — ABNORMAL HIGH (ref 0.3–1.2)
Total Protein: 6.9 g/dL (ref 6.5–8.1)

## 2019-07-25 MED ORDER — COLLAGENASE 250 UNIT/GM EX OINT
TOPICAL_OINTMENT | Freq: Every day | CUTANEOUS | Status: DC
  Filled 2019-07-25: qty 30

## 2019-07-25 MED ORDER — CEFAZOLIN SODIUM-DEXTROSE 1-4 GM/50ML-% IV SOLN
1.0000 g | Freq: Once | INTRAVENOUS | Status: AC
  Administered 2019-07-25: 14:00:00 1 g via INTRAVENOUS
  Filled 2019-07-25: qty 50

## 2019-07-25 NOTE — ED Provider Notes (Signed)
[facility]-EMERGENCY DEPT Provider Note   CSN: [id number] Arrival date & time: 07/25/19  1142     History Chief Complaint  Patient presents with  . Fall  . Leg Pain    Brian Mclaughlin is a 74 y.o. male.  Patient is a 74 year old male with past medical history of chronic renal insufficiency, atrial fibrillation, hypertension, hyperlipidemia.  He presents today for evaluation of a right knee injury.  Patient reports falling at home Friday night and injuring his knee.  Since then it has become more swollen, painful, and has a scabbed area.  He denies any fevers or chills.  He denies other injury.  The history is provided by the patient.       Past Medical History:  Diagnosis Date  . Abnormality of gait 06/14/2014  . Acute kidney failure, unspecified (HCC)   . Acute, but ill-defined, cerebrovascular disease   . Alcohol abuse   . Altered mental status   . Atrial fibrillation (HCC)   . Chronic systolic CHF (congestive heart failure) (HCC)   . CVA (cerebral infarction)   . ETOH abuse   . Hyperlipidemia   . Hypertension   . Nonspecific elevation of levels of transaminase or lactic acid dehydrogenase (LDH)   . Rhabdomyolysis   . Tobacco abuse   . Tobacco use disorder     Patient Active Problem List   Diagnosis Date Noted  . Right sided weakness 03/10/2018  . Fever 03/10/2018  . CKD (chronic kidney disease) stage 3, GFR 30-59 ml/min 03/10/2018  . AKI (acute kidney injury) (HCC)   . Abnormality of gait 06/14/2014  . Chronic atrial fibrillation (HCC)   . Stroke, embolic (HCC)   . Noncompliance with medications   . Acute CVA (cerebrovascular accident) (HCC) 05/24/2014  . Acute ischemic stroke (HCC) 05/24/2014  . CVA (cerebral infarction) 05/24/2014  . Chronic systolic CHF (congestive heart failure) (HCC)   . Acute systolic CHF (congestive heart failure) (HCC) 08/25/2011  . Stroke (HCC) 08/23/2011  . HTN (hypertension) 08/23/2011  . Alcohol abuse  08/23/2011  . Tobacco abuse 08/23/2011  . LFTs abnormal 08/23/2011  . Acute renal failure (HCC) 08/23/2011  . Rhabdomyolysis 08/23/2011    Past Surgical History:  Procedure Laterality Date  . BACK SURGERY  10 years ago   "slipped disc" repair  . EYE MUSCLE SURGERY     as a teenager "tighten his muscles"       Family History  Problem Relation Age of Onset  . Cancer Mother   . Cancer Father   . Multiple sclerosis Daughter     Social History   Tobacco Use  . Smoking status: Former Smoker    Packs/day: 0.50    Years: 45.00    Pack years: 22.50    Types: Cigarettes  Substance Use Topics  . Alcohol use: Yes    Comment: Drinks 6-8 beers daily  . Drug use: No    Comment: denies any drug usage    Home Medications Prior to Admission medications   Medication Sig Start Date End Date Taking? Authorizing Provider  atorvastatin (LIPITOR) 20 MG tablet Take 1 tablet (20 mg total) by mouth at bedtime. 03/12/18   Rai, Delene Ruffini, MD  carvedilol (COREG) 3.125 MG tablet Take 1 tablet (3.125 mg total) by mouth 2 (two) times daily with a meal. 03/12/18   Rai, Ripudeep K, MD  hydrALAZINE (APRESOLINE) 50 MG tablet Take 1 tablet (50 mg total) by mouth 3 (three) times daily. 03/12/18  Rai, Vernelle Emerald, MD  loperamide (IMODIUM) 2 MG capsule Take 1 capsule (2 mg total) by mouth as needed for diarrhea or loose stools. 03/12/18   Rai, Vernelle Emerald, MD  thiamine 100 MG tablet Take 1 tablet (100 mg total) by mouth daily. 03/12/18   Rai, Vernelle Emerald, MD    Allergies    Codeine and Penicillins  Review of Systems   Review of Systems  All other systems reviewed and are negative.   Physical Exam Updated Vital Signs BP 138/90 (BP Location: Right Arm)   Pulse 76   Temp 98.7 F (37.1 C) (Oral)   Resp 18   SpO2 100%   Physical Exam Vitals and nursing note reviewed.  Constitutional:      General: He is not in acute distress.    Appearance: He is well-developed. He is not diaphoretic.  HENT:       Head: Normocephalic and atraumatic.  Cardiovascular:     Rate and Rhythm: Normal rate and regular rhythm.     Heart sounds: No murmur. No friction rub.  Pulmonary:     Effort: Pulmonary effort is normal. No respiratory distress.     Breath sounds: Normal breath sounds. No wheezing or rales.  Abdominal:     General: Bowel sounds are normal. There is no distension.     Palpations: Abdomen is soft.     Tenderness: There is no abdominal tenderness.  Musculoskeletal:        General: Normal range of motion.     Cervical back: Normal range of motion and neck supple.     Right lower leg: Edema present.     Left lower leg: Edema present.     Comments: Both lower extremities with chronic venous stasis changes and edema noted.    The right knee is swollen, erythematous, and tender/warm to the touch.  There is a large area of eschar noted overlying the medial patella (see photo below).  There is pain with range of motion.  Skin:    General: Skin is warm and dry.  Neurological:     Mental Status: He is alert and oriented to person, place, and time.     Coordination: Coordination normal.       ED Results / Procedures / Treatments   Labs (all labs ordered are listed, but only abnormal results are displayed) Labs Reviewed  COMPREHENSIVE METABOLIC PANEL  CBC WITH DIFFERENTIAL/PLATELET    EKG None  Radiology No results found.  Procedures Procedures (including critical care time)  Medications Ordered in ED Medications - No data to display  ED Course  I have reviewed the triage vital signs and the nursing notes.  Pertinent labs & imaging results that were available during my care of the patient were reviewed by me and considered in my medical decision making (see chart for details).    MDM Rules/Calculators/A&P  Patient presents here with complaints of right knee pain after a fall 3 evenings ago.  He has what appears to be necrotic tissue to the medial aspect of the knee  overlying the patella.  There is surrounding erythema and warmth.  Patient is afebrile and laboratory studies otherwise appear unremarkable.  His x-rays are negative.  I have spoken with Dr. Algis Liming who agrees to admit the patient, however the patient has refused to stay.  I have advised him that I am very concerned about the appearance of the tissue to his knee.  I have strongly urged him to stay in the  hospital for IV antibiotics.  He continues to refuse.   He has been explained that going home could result in loss of tissue, loss of limb, or possibly sepsis and death.  Patient understands the risks and is willing to accept them.  He has decided to sign out AGAINST MEDICAL ADVICE.  Final Clinical Impression(s) / ED Diagnoses Final diagnoses:  None    Rx / DC Orders ED Discharge Orders    None       Geoffery Lyons, MD 07/25/19 [phone]

## 2019-07-25 NOTE — ED Notes (Signed)
Patient refused to sign. 

## 2019-07-25 NOTE — ED Triage Notes (Signed)
Patient here from home reporting fall on Friday, injury to right knee/leg. Discoloration and swelling noted.

## 2019-07-25 NOTE — Consult Note (Addendum)
WOC Nurse Consult Note: Reason for Consult: Consult requested for full thickness wound to right knee.  Pt fell prior to admission; consult performed remotely after review of progress notes and photo in the EMR.  Wound type: Full thickness wound with 100% tightly adhered slough eschar, generalized edema and erythremia.  Dressing procedure/placement/frequency: Topical treatment orders provided for staff nurses to perform daily as follows to assist with debridement of nonviable tissue: Apply Santyl to right knee wound Q day, then cover with moist guze, and ABD pad and kerlex. Pt could benefit from an ortho consult to determine if bedside debridement is indicted; please order if desired. Please re-consult if further assistance is needed.  Thank-you,  Cammie Mcgee MSN, RN, CWOCN, [place], CNS [phone]

## 2019-07-25 NOTE — ED Notes (Signed)
Patient refusing to be admitted. Stating "I need to go home because Im not sick". Dr Judd Lien and admitting physician notified. IV removed, wheelchair to entrance. Patient called [employer].

## 2019-07-27 ENCOUNTER — Other Ambulatory Visit: Payer: Self-pay

## 2019-07-27 ENCOUNTER — Inpatient Hospital Stay (HOSPITAL_COMMUNITY)
Admission: EM | Admit: 2019-07-27 | Discharge: 2019-07-31 | DRG: 603 | Discharge status: Against Medical Advice | Payer: Medicare HMO | Attending: Internal Medicine | Admitting: Internal Medicine

## 2019-07-27 DIAGNOSIS — F17210 Nicotine dependence, cigarettes, uncomplicated: Secondary | ICD-10-CM | POA: Diagnosis present

## 2019-07-27 DIAGNOSIS — W19XXXA Unspecified fall, initial encounter: Secondary | ICD-10-CM | POA: Diagnosis not present

## 2019-07-27 DIAGNOSIS — L03115 Cellulitis of right lower limb: Secondary | ICD-10-CM | POA: Diagnosis not present

## 2019-07-27 DIAGNOSIS — I5022 Chronic systolic (congestive) heart failure: Secondary | ICD-10-CM | POA: Diagnosis present

## 2019-07-27 DIAGNOSIS — Z880 Allergy status to penicillin: Secondary | ICD-10-CM

## 2019-07-27 DIAGNOSIS — Z03818 Encounter for observation for suspected exposure to other biological agents ruled out: Secondary | ICD-10-CM | POA: Diagnosis not present

## 2019-07-27 DIAGNOSIS — I130 Hypertensive heart and chronic kidney disease with heart failure and stage 1 through stage 4 chronic kidney disease, or unspecified chronic kidney disease: Secondary | ICD-10-CM | POA: Diagnosis present

## 2019-07-27 DIAGNOSIS — E875 Hyperkalemia: Secondary | ICD-10-CM | POA: Diagnosis present

## 2019-07-27 DIAGNOSIS — K5900 Constipation, unspecified: Secondary | ICD-10-CM | POA: Diagnosis not present

## 2019-07-27 DIAGNOSIS — L089 Local infection of the skin and subcutaneous tissue, unspecified: Secondary | ICD-10-CM

## 2019-07-27 DIAGNOSIS — Z8673 Personal history of transient ischemic attack (TIA), and cerebral infarction without residual deficits: Secondary | ICD-10-CM

## 2019-07-27 DIAGNOSIS — I10 Essential (primary) hypertension: Secondary | ICD-10-CM | POA: Diagnosis present

## 2019-07-27 DIAGNOSIS — W010XXA Fall on same level from slipping, tripping and stumbling without subsequent striking against object, initial encounter: Secondary | ICD-10-CM | POA: Diagnosis present

## 2019-07-27 DIAGNOSIS — R52 Pain, unspecified: Secondary | ICD-10-CM | POA: Diagnosis not present

## 2019-07-27 DIAGNOSIS — N1832 Chronic kidney disease, stage 3b: Secondary | ICD-10-CM | POA: Diagnosis present

## 2019-07-27 DIAGNOSIS — Z9119 Patient's noncompliance with other medical treatment and regimen: Secondary | ICD-10-CM

## 2019-07-27 DIAGNOSIS — T8140XA Infection following a procedure, unspecified, initial encounter: Secondary | ICD-10-CM | POA: Diagnosis not present

## 2019-07-27 DIAGNOSIS — T148XXA Other injury of unspecified body region, initial encounter: Secondary | ICD-10-CM | POA: Diagnosis not present

## 2019-07-27 DIAGNOSIS — I4891 Unspecified atrial fibrillation: Secondary | ICD-10-CM | POA: Diagnosis not present

## 2019-07-27 DIAGNOSIS — Z20822 Contact with and (suspected) exposure to covid-19: Secondary | ICD-10-CM | POA: Diagnosis present

## 2019-07-27 DIAGNOSIS — F1010 Alcohol abuse, uncomplicated: Secondary | ICD-10-CM | POA: Diagnosis present

## 2019-07-27 DIAGNOSIS — E782 Mixed hyperlipidemia: Secondary | ICD-10-CM | POA: Diagnosis present

## 2019-07-27 DIAGNOSIS — I482 Chronic atrial fibrillation, unspecified: Secondary | ICD-10-CM | POA: Diagnosis present

## 2019-07-27 DIAGNOSIS — Z885 Allergy status to narcotic agent status: Secondary | ICD-10-CM

## 2019-07-27 LAB — COMPREHENSIVE METABOLIC PANEL
ALT: 9 U/L (ref 0–44)
AST: 21 U/L (ref 15–41)
Albumin: 3.4 g/dL — ABNORMAL LOW (ref 3.5–5.0)
Alkaline Phosphatase: 66 U/L (ref 38–126)
Anion gap: 12 (ref 5–15)
BUN: 23 mg/dL (ref 8–23)
CO2: 20 mmol/L — ABNORMAL LOW (ref 22–32)
Calcium: 9.2 mg/dL (ref 8.9–10.3)
Chloride: 107 mmol/L (ref 98–111)
Creatinine, Ser: 2.03 mg/dL — ABNORMAL HIGH (ref 0.61–1.24)
GFR calc Af Amer: 36 mL/min — ABNORMAL LOW (ref >60)
GFR calc non Af Amer: 31 mL/min — ABNORMAL LOW (ref >60)
Glucose, Bld: 94 mg/dL (ref 70–99)
Potassium: 4.9 mmol/L (ref 3.5–5.1)
Sodium: 139 mmol/L (ref 135–145)
Total Bilirubin: 1.8 mg/dL — ABNORMAL HIGH (ref 0.3–1.2)
Total Protein: 7.3 g/dL (ref 6.5–8.1)

## 2019-07-27 LAB — CBC WITH DIFFERENTIAL/PLATELET
Abs Immature Granulocytes: 0.02 10*3/uL (ref 0.00–0.07)
Basophils Absolute: 0.1 10*3/uL (ref 0.0–0.1)
Basophils Relative: 1 %
Eosinophils Absolute: 0.2 10*3/uL (ref 0.0–0.5)
Eosinophils Relative: 3 %
HCT: 42 % (ref 39.0–52.0)
Hemoglobin: 13 g/dL (ref 13.0–17.0)
Immature Granulocytes: 0 %
Lymphocytes Relative: 24 %
Lymphs Abs: 1.7 10*3/uL (ref 0.7–4.0)
MCH: 33.3 pg (ref 26.0–34.0)
MCHC: 31 g/dL (ref 30.0–36.0)
MCV: 107.7 fL — ABNORMAL HIGH (ref 80.0–100.0)
Monocytes Absolute: 0.7 10*3/uL (ref 0.1–1.0)
Monocytes Relative: 11 %
Neutro Abs: 4.3 10*3/uL (ref 1.7–7.7)
Neutrophils Relative %: 61 %
Platelets: 198 10*3/uL (ref 150–400)
RBC: 3.9 MIL/uL — ABNORMAL LOW (ref 4.22–5.81)
RDW: 12.9 % (ref 11.5–15.5)
WBC: 7 10*3/uL (ref 4.0–10.5)
nRBC: 0 % (ref 0.0–0.2)

## 2019-07-27 LAB — SARS CORONAVIRUS 2 BY RT PCR (HOSPITAL ORDER, PERFORMED IN ~~LOC~~ HOSPITAL LAB): SARS Coronavirus 2: NEGATIVE

## 2019-07-27 MED ORDER — VANCOMYCIN HCL IN DEXTROSE 1-5 GM/200ML-% IV SOLN
1000.0000 mg | INTRAVENOUS | Status: DC
  Administered 2019-07-28 – 2019-07-30 (×3): 1000 mg via INTRAVENOUS
  Filled 2019-07-27 (×5): qty 200

## 2019-07-27 MED ORDER — VANCOMYCIN HCL 1750 MG/350ML IV SOLN
1750.0000 mg | Freq: Once | INTRAVENOUS | Status: AC
  Administered 2019-07-27: 23:00:00 1750 mg via INTRAVENOUS
  Filled 2019-07-27: qty 350

## 2019-07-27 NOTE — ED Triage Notes (Signed)
Pt bib ems from home with reports of falling 3 days ago, left ama. Pt with skin tear from that fall that now appears to be infected. Pt reports neuropathy.  154/90 HR 72 RR 18 98% RA 98.27F

## 2019-07-27 NOTE — ED Provider Notes (Signed)
[name] EMERGENCY DEPARTMENT Provider Note   CSN: [id number] Arrival date & time: 07/27/19  1626     History Chief Complaint  Patient presents with  . Wound Check    Brian Mclaughlin is a 74 y.o. male.  Patient presents to the emergency department with chief complaint of right knee injury.  He states that he slipped and fell approximately 1 week ago.  He sustained an injury to his right knee.  He was seen 2 days ago and was offered admission to the hospital for infection of the skin surrounding his knee.  He declined.  He states that he has now changed his mind.  He denies any fever, chills, pain.  He states that he does not have any medical problems and does not take any medications, but his past medical history is notable for afib, CVA, HTN, HL.  He denies taking medications.  The history is provided by the patient. No language interpreter was used.       Past Medical History:  Diagnosis Date  . Abnormality of gait 06/14/2014  . Acute kidney failure, unspecified (HCC)   . Acute, but ill-defined, cerebrovascular disease   . Alcohol abuse   . Altered mental status   . Atrial fibrillation (HCC)   . Chronic systolic CHF (congestive heart failure) (HCC)   . CVA (cerebral infarction)   . ETOH abuse   . Hyperlipidemia   . Hypertension   . Nonspecific elevation of levels of transaminase or lactic acid dehydrogenase (LDH)   . Rhabdomyolysis   . Tobacco abuse   . Tobacco use disorder     Patient Active Problem List   Diagnosis Date Noted  . Cellulitis of right lower extremity 07/25/2019  . Right sided weakness 03/10/2018  . Fever 03/10/2018  . CKD (chronic kidney disease) stage 3, GFR 30-59 ml/min 03/10/2018  . AKI (acute kidney injury) (HCC)   . Abnormality of gait 06/14/2014  . Chronic atrial fibrillation (HCC)   . Stroke, embolic (HCC)   . Noncompliance with medications   . Acute CVA (cerebrovascular accident) (HCC) 05/24/2014  . Acute ischemic stroke  (HCC) 05/24/2014  . CVA (cerebral infarction) 05/24/2014  . Chronic systolic CHF (congestive heart failure) (HCC)   . Acute systolic CHF (congestive heart failure) (HCC) 08/25/2011  . Stroke (HCC) 08/23/2011  . HTN (hypertension) 08/23/2011  . Alcohol abuse 08/23/2011  . Tobacco abuse 08/23/2011  . LFTs abnormal 08/23/2011  . Acute renal failure (HCC) 08/23/2011  . Rhabdomyolysis 08/23/2011    Past Surgical History:  Procedure Laterality Date  . BACK SURGERY  10 years ago   "slipped disc" repair  . EYE MUSCLE SURGERY     as a teenager "tighten his muscles"       Family History  Problem Relation Age of Onset  . Cancer Mother   . Cancer Father   . Multiple sclerosis Daughter     Social History   Tobacco Use  . Smoking status: Former Smoker    Packs/day: 0.50    Years: 45.00    Pack years: 22.50    Types: Cigarettes  . Smokeless tobacco: Never Used  Substance Use Topics  . Alcohol use: Yes    Comment: Drinks 6-8 beers daily  . Drug use: No    Comment: denies any drug usage    Home Medications Prior to Admission medications   Medication Sig Start Date End Date Taking? Authorizing Provider  atorvastatin (LIPITOR) 20 MG tablet Take 1 tablet (  20 mg total) by mouth at bedtime. 03/12/18   Rai, Delene Ruffini, MD  carvedilol (COREG) 3.125 MG tablet Take 1 tablet (3.125 mg total) by mouth 2 (two) times daily with a meal. 03/12/18   Rai, Ripudeep K, MD  hydrALAZINE (APRESOLINE) 50 MG tablet Take 1 tablet (50 mg total) by mouth 3 (three) times daily. 03/12/18   Rai, Delene Ruffini, MD  loperamide (IMODIUM) 2 MG capsule Take 1 capsule (2 mg total) by mouth as needed for diarrhea or loose stools. 03/12/18   Rai, Delene Ruffini, MD  thiamine 100 MG tablet Take 1 tablet (100 mg total) by mouth daily. 03/12/18   Rai, Delene Ruffini, MD    Allergies    Codeine and Penicillins  Review of Systems   Review of Systems  All other systems reviewed and are negative.   Physical Exam Updated  Vital Signs BP (!) 155/78   Pulse 90   Temp 98.2 F (36.8 C) (Oral)   Resp 16   Ht 6' (1.829 m)   Wt 90.7 kg   SpO2 98%   BMI 27.12 kg/m   Physical Exam Vitals and nursing note reviewed.  Constitutional:      Appearance: He is well-developed.  HENT:     Head: Normocephalic and atraumatic.  Eyes:     Conjunctiva/sclera: Conjunctivae normal.  Cardiovascular:     Rate and Rhythm: Normal rate and regular rhythm.     Heart sounds: No murmur.  Pulmonary:     Effort: Pulmonary effort is normal. No respiratory distress.     Breath sounds: Normal breath sounds.  Abdominal:     Palpations: Abdomen is soft.     Tenderness: There is no abdominal tenderness.  Musculoskeletal:        General: Normal range of motion.     Cervical back: Neck supple.  Skin:    General: Skin is warm and dry.     Findings: Erythema present.     Comments: Erythematous right knee, no palpable effusion, no discharge  Neurological:     Mental Status: He is alert and oriented to person, place, and time.  Psychiatric:        Mood and Affect: Mood normal.        Behavior: Behavior normal.         ED Results / Procedures / Treatments   Labs (all labs ordered are listed, but only abnormal results are displayed) Labs Reviewed  COMPREHENSIVE METABOLIC PANEL - Abnormal; Notable for the following components:      Result Value   CO2 20 (*)    Creatinine, Ser 2.03 (*)    Albumin 3.4 (*)    Total Bilirubin 1.8 (*)    GFR calc non Af Amer 31 (*)    GFR calc Af Amer 36 (*)    All other components within normal limits  CBC WITH DIFFERENTIAL/PLATELET - Abnormal; Notable for the following components:   RBC 3.90 (*)    MCV 107.7 (*)    All other components within normal limits  CULTURE, BLOOD (ROUTINE X 2)  SARS CORONAVIRUS 2 BY RT PCR (HOSPITAL ORDER, PERFORMED IN [facility])    EKG None  Radiology No results found.  Procedures Procedures (including critical care time)  Medications  Ordered in ED Medications  vancomycin (VANCOREADY) IVPB 1750 mg/350 mL (has no administration in time range)  vancomycin (VANCOCIN) IVPB 1000 mg/200 mL premix (has no administration in time range)    ED Course  I  have reviewed the triage vital signs and the nursing notes.  Pertinent labs & imaging results that were available during my care of the patient were reviewed by me and considered in my medical decision making (see chart for details).    MDM Rules/Calculators/A&P                      This patient complains of right knee wound/infection, this involves an extensive number of treatment options, and is a complaint that carries with it a high risk of complications and morbidity.  The differential diagnosis includes cellulitis, septic joint, occult fx, venous stasis, 2nd degree burn.  Pertinent Labs I ordered, reviewed, and interpreted labs, which included CBC without leukocytosis.  Imaging Interpretation I ordered imaging studies which included CT right knee.  Hospitalist and ortho to f/u on results.  Medications I ordered medication IV vancomycin for cellulitis.  Sources Previous records obtained and reviewed recent visit for the same a couple of days ago, but left AMA.  Willing to stay now.  Consultants Dr. Cyd Silence, who is much appreciated for admitting the patient. Dr. Marlou Sa, orthopedics, who will consult in the morning.   Reassessments After the interventions stated above, I reevaluated the patient and found stable.  Agreeable with plan for admission.  Final Clinical Impression(s) / ED Diagnoses Final diagnoses:  Wound infection  Cellulitis of right lower extremity    Rx / DC Orders ED Discharge Orders    None       Montine Circle, PA-C 07/28/19 [id number]    Charlesetta Shanks, MD 07/28/19 2253

## 2019-07-27 NOTE — Progress Notes (Signed)
Pharmacy Antibiotic Note  Brian Mclaughlin is a 74 y.o. male admitted on 07/27/2019 with cellulitis.  Pharmacy has been consulted for vancomycin dosing. WBC wnl. SCr 2.03. AF.   Plan: -Vancomycin 1750 mg IV once, then start vancomycin 1 gm IV Q 24 hours -Monitor CBC, renal fx, cultures and clinical progress  -VT at [facility]   Height: 6' (182.9 cm) Weight: 90.7 kg (200 lb) IBW/kg (Calculated) : 77.6  Temp (24hrs), Avg:98.1 F (36.7 C), Min:98 F (36.7 C), Max:98.2 F (36.8 C)  Recent Labs  Lab 07/25/19 1202 07/27/19 1631  WBC 8.4 7.0  CREATININE 1.88* 2.03*    Estimated Creatinine Clearance: 35 mL/min (A) (by C-G formula based on SCr of 2.03 mg/dL (H)).    Allergies  Allergen Reactions  . Codeine Rash  . Penicillins Rash    Antimicrobials this admission: Vanc 5/12 >>     Thank you for allowing pharmacy to be a part of this patient's care.  Vinnie Level, PharmD., BCPS, BCCCP Clinical Pharmacist Clinical phone for 07/27/19 until 11:30pm: [phone] If after 11:30pm, please refer to [facility] for unit-specific pharmacist

## 2019-07-27 NOTE — ED Notes (Signed)
Called pt to be roomed x3 and had no answer. 

## 2019-07-28 ENCOUNTER — Encounter (HOSPITAL_COMMUNITY): Payer: Self-pay | Admitting: Internal Medicine

## 2019-07-28 ENCOUNTER — Observation Stay (HOSPITAL_COMMUNITY): Payer: Medicare HMO

## 2019-07-28 ENCOUNTER — Observation Stay (HOSPITAL_COMMUNITY)
Admission: EM | Admit: 2019-07-28 | Discharge: 2019-07-28 | Disposition: A | Discharge status: Home / Self Care | Payer: Medicare HMO | Source: Home / Self Care | Attending: Internal Medicine | Admitting: Internal Medicine

## 2019-07-28 DIAGNOSIS — F17210 Nicotine dependence, cigarettes, uncomplicated: Secondary | ICD-10-CM | POA: Diagnosis present

## 2019-07-28 DIAGNOSIS — M7989 Other specified soft tissue disorders: Secondary | ICD-10-CM

## 2019-07-28 DIAGNOSIS — E782 Mixed hyperlipidemia: Secondary | ICD-10-CM

## 2019-07-28 DIAGNOSIS — R69 Illness, unspecified: Secondary | ICD-10-CM | POA: Diagnosis not present

## 2019-07-28 DIAGNOSIS — Z20822 Contact with and (suspected) exposure to covid-19: Secondary | ICD-10-CM | POA: Diagnosis not present

## 2019-07-28 DIAGNOSIS — Z8673 Personal history of transient ischemic attack (TIA), and cerebral infarction without residual deficits: Secondary | ICD-10-CM | POA: Diagnosis not present

## 2019-07-28 DIAGNOSIS — S8011XA Contusion of right lower leg, initial encounter: Secondary | ICD-10-CM | POA: Diagnosis not present

## 2019-07-28 DIAGNOSIS — N1832 Chronic kidney disease, stage 3b: Secondary | ICD-10-CM | POA: Diagnosis not present

## 2019-07-28 DIAGNOSIS — I130 Hypertensive heart and chronic kidney disease with heart failure and stage 1 through stage 4 chronic kidney disease, or unspecified chronic kidney disease: Secondary | ICD-10-CM | POA: Diagnosis not present

## 2019-07-28 DIAGNOSIS — S8001XA Contusion of right knee, initial encounter: Secondary | ICD-10-CM | POA: Diagnosis not present

## 2019-07-28 DIAGNOSIS — I482 Chronic atrial fibrillation, unspecified: Secondary | ICD-10-CM | POA: Diagnosis not present

## 2019-07-28 DIAGNOSIS — T148XXA Other injury of unspecified body region, initial encounter: Secondary | ICD-10-CM | POA: Diagnosis present

## 2019-07-28 DIAGNOSIS — E875 Hyperkalemia: Secondary | ICD-10-CM | POA: Diagnosis present

## 2019-07-28 DIAGNOSIS — K5900 Constipation, unspecified: Secondary | ICD-10-CM | POA: Diagnosis not present

## 2019-07-28 DIAGNOSIS — Z880 Allergy status to penicillin: Secondary | ICD-10-CM | POA: Diagnosis not present

## 2019-07-28 DIAGNOSIS — Z885 Allergy status to narcotic agent status: Secondary | ICD-10-CM | POA: Diagnosis not present

## 2019-07-28 DIAGNOSIS — I5022 Chronic systolic (congestive) heart failure: Secondary | ICD-10-CM | POA: Diagnosis not present

## 2019-07-28 DIAGNOSIS — W010XXA Fall on same level from slipping, tripping and stumbling without subsequent striking against object, initial encounter: Secondary | ICD-10-CM | POA: Diagnosis not present

## 2019-07-28 DIAGNOSIS — I10 Essential (primary) hypertension: Secondary | ICD-10-CM | POA: Diagnosis not present

## 2019-07-28 DIAGNOSIS — L03115 Cellulitis of right lower limb: Secondary | ICD-10-CM | POA: Diagnosis not present

## 2019-07-28 DIAGNOSIS — F1010 Alcohol abuse, uncomplicated: Secondary | ICD-10-CM | POA: Diagnosis present

## 2019-07-28 DIAGNOSIS — Z9119 Patient's noncompliance with other medical treatment and regimen: Secondary | ICD-10-CM | POA: Diagnosis not present

## 2019-07-28 LAB — CBC WITH DIFFERENTIAL/PLATELET
Abs Immature Granulocytes: 0.02 10*3/uL (ref 0.00–0.07)
Basophils Absolute: 0.1 10*3/uL (ref 0.0–0.1)
Basophils Relative: 1 %
Eosinophils Absolute: 0.3 10*3/uL (ref 0.0–0.5)
Eosinophils Relative: 4 %
HCT: 37.8 % — ABNORMAL LOW (ref 39.0–52.0)
Hemoglobin: 12.2 g/dL — ABNORMAL LOW (ref 13.0–17.0)
Immature Granulocytes: 0 %
Lymphocytes Relative: 30 %
Lymphs Abs: 2 10*3/uL (ref 0.7–4.0)
MCH: 34 pg (ref 26.0–34.0)
MCHC: 32.3 g/dL (ref 30.0–36.0)
MCV: 105.3 fL — ABNORMAL HIGH (ref 80.0–100.0)
Monocytes Absolute: 0.7 10*3/uL (ref 0.1–1.0)
Monocytes Relative: 10 %
Neutro Abs: 3.7 10*3/uL (ref 1.7–7.7)
Neutrophils Relative %: 55 %
Platelets: 187 10*3/uL (ref 150–400)
RBC: 3.59 MIL/uL — ABNORMAL LOW (ref 4.22–5.81)
RDW: 12.7 % (ref 11.5–15.5)
WBC: 6.7 10*3/uL (ref 4.0–10.5)
nRBC: 0 % (ref 0.0–0.2)

## 2019-07-28 LAB — URINALYSIS, ROUTINE W REFLEX MICROSCOPIC
Bilirubin Urine: NEGATIVE
Glucose, UA: NEGATIVE mg/dL
Ketones, ur: NEGATIVE mg/dL
Leukocytes,Ua: NEGATIVE
Nitrite: NEGATIVE
Protein, ur: 30 mg/dL — AB
Specific Gravity, Urine: 1.026 (ref 1.005–1.030)
pH: 5 (ref 5.0–8.0)

## 2019-07-28 LAB — COMPREHENSIVE METABOLIC PANEL
ALT: 11 U/L (ref 0–44)
AST: 36 U/L (ref 15–41)
Albumin: 2.9 g/dL — ABNORMAL LOW (ref 3.5–5.0)
Alkaline Phosphatase: 59 U/L (ref 38–126)
Anion gap: 11 (ref 5–15)
BUN: 23 mg/dL (ref 8–23)
CO2: 22 mmol/L (ref 22–32)
Calcium: 8.8 mg/dL — ABNORMAL LOW (ref 8.9–10.3)
Chloride: 106 mmol/L (ref 98–111)
Creatinine, Ser: 1.78 mg/dL — ABNORMAL HIGH (ref 0.61–1.24)
GFR calc Af Amer: 43 mL/min — ABNORMAL LOW (ref >60)
GFR calc non Af Amer: 37 mL/min — ABNORMAL LOW (ref >60)
Glucose, Bld: 84 mg/dL (ref 70–99)
Potassium: 5.6 mmol/L — ABNORMAL HIGH (ref 3.5–5.1)
Sodium: 139 mmol/L (ref 135–145)
Total Bilirubin: 2.2 mg/dL — ABNORMAL HIGH (ref 0.3–1.2)
Total Protein: 6.3 g/dL — ABNORMAL LOW (ref 6.5–8.1)

## 2019-07-28 LAB — C-REACTIVE PROTEIN: CRP: 9.9 mg/dL — ABNORMAL HIGH (ref <1.0)

## 2019-07-28 LAB — MAGNESIUM: Magnesium: 2.2 mg/dL (ref 1.7–2.4)

## 2019-07-28 LAB — PROTIME-INR
INR: 1.1 (ref 0.8–1.2)
Prothrombin Time: 13.3 seconds (ref 11.4–15.2)

## 2019-07-28 LAB — APTT: aPTT: 30 seconds (ref 24–36)

## 2019-07-28 LAB — MRSA PCR SCREENING: MRSA by PCR: NEGATIVE

## 2019-07-28 MED ORDER — ONDANSETRON HCL 4 MG/2ML IJ SOLN: 4.0000 mg | Freq: Four times a day (QID) | INTRAMUSCULAR | Status: DC | PRN

## 2019-07-28 MED ORDER — ACETAMINOPHEN 325 MG PO TABS: 650.0000 mg | ORAL_TABLET | Freq: Four times a day (QID) | ORAL | Status: DC | PRN

## 2019-07-28 MED ORDER — ADULT MULTIVITAMIN W/MINERALS CH
1.0000 | ORAL_TABLET | Freq: Every day | ORAL | Status: DC
  Administered 2019-07-28 – 2019-07-31 (×4): 1 via ORAL
  Filled 2019-07-28 (×4): qty 1

## 2019-07-28 MED ORDER — COLLAGENASE 250 UNIT/GM EX OINT
TOPICAL_OINTMENT | Freq: Every day | CUTANEOUS | Status: DC
  Filled 2019-07-28: qty 30

## 2019-07-28 MED ORDER — ENOXAPARIN SODIUM 40 MG/0.4ML ~~LOC~~ SOLN
40.0000 mg | SUBCUTANEOUS | Status: DC
  Administered 2019-07-29 – 2019-07-30 (×2): 40 mg via SUBCUTANEOUS
  Filled 2019-07-28 (×2): qty 0.4

## 2019-07-28 MED ORDER — ENOXAPARIN SODIUM 30 MG/0.3ML ~~LOC~~ SOLN: 30.0000 mg | SUBCUTANEOUS | Status: DC

## 2019-07-28 MED ORDER — FOLIC ACID 1 MG PO TABS
1.0000 mg | ORAL_TABLET | Freq: Every day | ORAL | Status: DC
  Administered 2019-07-28 – 2019-07-31 (×4): 1 mg via ORAL
  Filled 2019-07-28 (×4): qty 1

## 2019-07-28 MED ORDER — POLYETHYLENE GLYCOL 3350 17 G PO PACK: 17.0000 g | PACK | Freq: Every day | ORAL | Status: DC | PRN

## 2019-07-28 MED ORDER — ATORVASTATIN CALCIUM 80 MG PO TABS
80.0000 mg | ORAL_TABLET | Freq: Every day | ORAL | Status: DC
  Administered 2019-07-28 – 2019-07-29 (×2): 80 mg via ORAL
  Filled 2019-07-28 (×2): qty 1

## 2019-07-28 MED ORDER — ACETAMINOPHEN 650 MG RE SUPP: 650.0000 mg | Freq: Four times a day (QID) | RECTAL | Status: DC | PRN

## 2019-07-28 MED ORDER — LORAZEPAM 2 MG/ML IJ SOLN: 1.0000 mg | INTRAMUSCULAR | Status: AC | PRN

## 2019-07-28 MED ORDER — SODIUM ZIRCONIUM CYCLOSILICATE 10 G PO PACK
10.0000 g | PACK | Freq: Every day | ORAL | Status: DC
  Administered 2019-07-28 – 2019-07-31 (×4): 10 g via ORAL
  Filled 2019-07-28 (×4): qty 1

## 2019-07-28 MED ORDER — LORAZEPAM 1 MG PO TABS: 1.0000 mg | ORAL_TABLET | ORAL | Status: AC | PRN

## 2019-07-28 MED ORDER — THIAMINE HCL 100 MG/ML IJ SOLN: 100.0000 mg | Freq: Every day | INTRAMUSCULAR | Status: DC

## 2019-07-28 MED ORDER — SODIUM CHLORIDE 0.9 % IV SOLN
1.0000 g | INTRAVENOUS | Status: DC
  Administered 2019-07-28 – 2019-07-31 (×4): 1 g via INTRAVENOUS
  Filled 2019-07-28 (×2): qty 1
  Filled 2019-07-28 (×2): qty 10

## 2019-07-28 MED ORDER — ONDANSETRON HCL 4 MG PO TABS: 4.0000 mg | ORAL_TABLET | Freq: Four times a day (QID) | ORAL | Status: DC | PRN

## 2019-07-28 MED ORDER — MORPHINE SULFATE (PF) 2 MG/ML IV SOLN: 2.0000 mg | INTRAVENOUS | Status: DC | PRN

## 2019-07-28 MED ORDER — THIAMINE HCL 100 MG PO TABS
100.0000 mg | ORAL_TABLET | Freq: Every day | ORAL | Status: DC
  Administered 2019-07-28 – 2019-07-31 (×4): 100 mg via ORAL
  Filled 2019-07-28 (×4): qty 1

## 2019-07-28 MED ORDER — SODIUM CHLORIDE 0.9 % IV SOLN: INTRAVENOUS | Status: DC

## 2019-07-28 MED ORDER — OXYCODONE-ACETAMINOPHEN 5-325 MG PO TABS
1.0000 | ORAL_TABLET | ORAL | Status: DC | PRN
  Administered 2019-07-29: 1 via ORAL
  Filled 2019-07-28 (×2): qty 1

## 2019-07-28 MED ORDER — LACTATED RINGERS IV SOLN: INTRAVENOUS | Status: AC

## 2019-07-28 NOTE — Consult Note (Signed)
Reason for Consult:Right lower ext cellulitis, r/o septic arthritis Referring Physician: Darron Doom is an 74 y.o. male.  HPI: Brian Mclaughlin came to the ED with a 1 week hx/o progressive RLE pain and redness. He scraped it fairly badly when he fell at home. He came in on 5/10 and was recommended to be admitted but left AMA. He returns because he can no longer put weight down on the leg because it hurts too badly. He denies fevers, chills, sweats, N/V, hx/o similar, or prior issues with that knee.  Past Medical History:  Diagnosis Date  . Abnormality of gait 06/14/2014  . Acute CVA (cerebrovascular accident) (HCC) 05/24/2014  . Acute kidney failure, unspecified (HCC)   . Acute, but ill-defined, cerebrovascular disease   . Alcohol abuse   . Altered mental status   . Atrial fibrillation (HCC)   . Chronic systolic CHF (congestive heart failure) (HCC)   . CVA (cerebral infarction)   . ETOH abuse   . Hyperlipidemia   . Hypertension   . Nonspecific elevation of levels of transaminase or lactic acid dehydrogenase (LDH)   . Rhabdomyolysis   . Tobacco abuse   . Tobacco use disorder     Past Surgical History:  Procedure Laterality Date  . BACK SURGERY  10 years ago   "slipped disc" repair  . EYE MUSCLE SURGERY     as a teenager "tighten his muscles"    Family History  Problem Relation Age of Onset  . Cancer Mother   . Cancer Father   . Multiple sclerosis Daughter     Social History:  reports that he has quit smoking. His smoking use included cigarettes. He has a 22.50 pack-year smoking history. He has never used smokeless tobacco. He reports current alcohol use. He reports that he does not use drugs.  Allergies:  Allergies  Allergen Reactions  . Codeine Rash  . Penicillins Rash    Medications: I have reviewed the patient's current medications.  Results for orders placed or performed during the hospital encounter of 07/27/19 (from the past 48 hour(s))  Comprehensive metabolic  panel     Status: Abnormal   Collection Time: 07/27/19  4:31 PM  Result Value Ref Range   Sodium 139 135 - 145 mmol/L   Potassium 4.9 3.5 - 5.1 mmol/L   Chloride 107 98 - 111 mmol/L   CO2 20 (L) 22 - 32 mmol/L   Glucose, Bld 94 70 - 99 mg/dL    Comment: Glucose reference range applies only to samples taken after fasting for at least 8 hours.   BUN 23 8 - 23 mg/dL   Creatinine, Ser 1.61 (H) 0.61 - 1.24 mg/dL   Calcium 9.2 8.9 - 09.6 mg/dL   Total Protein 7.3 6.5 - 8.1 g/dL   Albumin 3.4 (L) 3.5 - 5.0 g/dL   AST 21 15 - 41 U/L   ALT 9 0 - 44 U/L   Alkaline Phosphatase 66 38 - 126 U/L   Total Bilirubin 1.8 (H) 0.3 - 1.2 mg/dL   GFR calc non Af Amer 31 (L) >60 mL/min   GFR calc Af Amer 36 (L) >60 mL/min   Anion gap 12 5 - 15    Comment: Performed at [facility], [address] [place], [place] [address]  CBC with Differential     Status: Abnormal   Collection Time: 07/27/19  4:31 PM  Result Value Ref Range   WBC 7.0 4.0 - 10.5 K/uL  RBC 3.90 (L) 4.22 - 5.81 MIL/uL   Hemoglobin 13.0 13.0 - 17.0 g/dL   HCT 25.3 66.4 - 40.3 %   MCV 107.7 (H) 80.0 - 100.0 fL   MCH 33.3 26.0 - 34.0 pg   MCHC 31.0 30.0 - 36.0 g/dL   RDW 47.4 25.9 - 56.3 %   Platelets 198 150 - 400 K/uL   nRBC 0.0 0.0 - 0.2 %   Neutrophils Relative % 61 %   Neutro Abs 4.3 1.7 - 7.7 K/uL   Lymphocytes Relative 24 %   Lymphs Abs 1.7 0.7 - 4.0 K/uL   Monocytes Relative 11 %   Monocytes Absolute 0.7 0.1 - 1.0 K/uL   Eosinophils Relative 3 %   Eosinophils Absolute 0.2 0.0 - 0.5 K/uL   Basophils Relative 1 %   Basophils Absolute 0.1 0.0 - 0.1 K/uL   Immature Granulocytes 0 %   Abs Immature Granulocytes 0.02 0.00 - 0.07 K/uL    Comment: Performed at [facility], [address], [place], [place] [address]  Culture, blood (routine x 2)     Status: None (Preliminary result)   Collection Time: 07/27/19 10:38 PM   Specimen: BLOOD  Result Value Ref Range   Specimen Description BLOOD RIGHT ANTECUBITAL     Special Requests      BOTTLES DRAWN AEROBIC AND ANAEROBIC Blood Culture adequate volume   Culture      NO GROWTH < 12 HOURS Performed at [facility], [address], [place], [place] [address]    Report Status PENDING   SARS Coronavirus 2 by RT PCR (hospital order, performed in [facility] hospital lab) Nasopharyngeal Nasopharyngeal Swab     Status: None   Collection Time: 07/27/19 10:39 PM   Specimen: Nasopharyngeal Swab  Result Value Ref Range   SARS Coronavirus 2 NEGATIVE NEGATIVE    Comment: (NOTE) SARS-CoV-2 target nucleic acids are NOT DETECTED. The SARS-CoV-2 RNA is generally detectable in upper and lower respiratory specimens during the acute phase of infection. The lowest concentration of SARS-CoV-2 viral copies this assay can detect is 250 copies / mL. A negative result does not preclude SARS-CoV-2 infection and should not be used as the sole basis for treatment or other patient management decisions.  A negative result may occur with improper specimen collection / handling, submission of specimen other than nasopharyngeal swab, presence of viral mutation(s) within the areas targeted by this assay, and inadequate number of viral copies (<250 copies / mL). A negative result must be combined with clinical observations, patient history, and epidemiological information. Fact Sheet for Patients:   BoilerBrush.com.cy Fact Sheet for Healthcare Providers: https://pope.com/ This test is not yet approved or cleared  by the [place] FDA and has been authorized for detection and/or diagnosis of SARS-CoV-2 by FDA under an Emergency Use Authorization (EUA).  This EUA will remain in effect (meaning this test can be used) for the duration of the COVID-19 declaration under Section 564(b)(1) of the Act, 21 U.S.C. section 360bbb-3(b)(1), unless the authorization is terminated or revoked sooner. Performed at [facility],  [address], [place], [place] [address]   C-reactive protein     Status: Abnormal   Collection Time: 07/28/19  3:46 AM  Result Value Ref Range   CRP 9.9 (H) <1.0 mg/dL    Comment: Performed at [facility], [address], [place], [place] [address]  Magnesium     Status: None   Collection Time: 07/28/19  3:46 AM  Result Value Ref Range   Magnesium 2.2 1.7 - 2.4 mg/dL    Comment: Performed at [facility], [address], [place], [place] [address]  Comprehensive metabolic panel     Status: Abnormal   Collection Time: 07/28/19  3:46 AM  Result Value Ref Range   Sodium 139 135 - 145 mmol/L   Potassium 5.6 (H) 3.5 - 5.1 mmol/L    Comment: HEMOLYSIS AT THIS LEVEL MAY AFFECT RESULT   Chloride 106 98 - 111 mmol/L   CO2 22 22 - 32 mmol/L   Glucose, Bld 84 70 - 99 mg/dL    Comment: Glucose reference range applies only to samples taken after fasting for at least 8 hours.   BUN 23 8 - 23 mg/dL   Creatinine, Ser 1.74 (H) 0.61 - 1.24 mg/dL   Calcium 8.8 (L) 8.9 - 10.3 mg/dL   Total Protein 6.3 (L) 6.5 - 8.1 g/dL   Albumin 2.9 (L) 3.5 - 5.0 g/dL   AST 36 15 - 41 U/L   ALT 11 0 - 44 U/L   Alkaline Phosphatase 59 38 - 126 U/L   Total Bilirubin 2.2 (H) 0.3 - 1.2 mg/dL   GFR calc non Af Amer 37 (L) >60 mL/min   GFR calc Af Amer 43 (L) >60 mL/min   Anion gap 11 5 - 15    Comment: Performed at [facility], [address] [place], [place] [address]  Protime-INR     Status: None   Collection Time: 07/28/19  3:46 AM  Result Value Ref Range   Prothrombin Time 13.3 11.4 - 15.2 seconds   INR 1.1 0.8 - 1.2    Comment: (NOTE) INR goal varies based on device and disease states. Performed at [facility] Lab, [address], [place], [place] [address]   APTT     Status: None   Collection Time: 07/28/19  3:46 AM  Result Value Ref Range   aPTT 30 24 - 36 seconds    Comment: Performed at [facility], [address], [place], [place] [address]  CBC with  Differential/Platelet     Status: Abnormal   Collection Time: 07/28/19  5:57 AM  Result Value Ref Range   WBC 6.7 4.0 - 10.5 K/uL   RBC 3.59 (L) 4.22 - 5.81 MIL/uL   Hemoglobin 12.2 (L) 13.0 - 17.0 g/dL   HCT 02.6 (L) 37.8 - 58.8 %   MCV 105.3 (H) 80.0 - 100.0 fL   MCH 34.0 26.0 - 34.0 pg   MCHC 32.3 30.0 - 36.0 g/dL   RDW 50.2 77.4 - 12.8 %   Platelets 187 150 - 400 K/uL   nRBC 0.0 0.0 - 0.2 %   Neutrophils Relative % 55 %   Neutro Abs 3.7 1.7 - 7.7 K/uL   Lymphocytes Relative 30 %   Lymphs Abs 2.0 0.7 - 4.0 K/uL   Monocytes Relative 10 %   Monocytes Absolute 0.7 0.1 - 1.0 K/uL   Eosinophils Relative 4 %   Eosinophils Absolute 0.3 0.0 - 0.5 K/uL   Basophils Relative 1 %   Basophils Absolute 0.1 0.0 - 0.1 K/uL   Immature Granulocytes 0 %   Abs Immature Granulocytes 0.02 0.00 - 0.07 K/uL    Comment: Performed at [facility], [address] [place], [place] [address]    CT Knee Right Wo Contrast  Result Date: 07/28/2019 CLINICAL DATA:  Knee pain, recent  fall EXAM: CT OF THE right  KNEE WITHOUT CONTRAST TECHNIQUE: Multidetector CT imaging of the right knee was performed according to the standard protocol. Multiplanar CT image reconstructions were also generated. COMPARISON:  Jul 25, 2019 FINDINGS: Bones/Joint/Cartilage No fracture or dislocation. There is diffuse osteopenia. 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 Slight lateral subluxation of the patella is noted. No large knee joint effusion is seen. Ligaments Suboptimally assessed by CT. Muscles and Tendons Mild fatty atrophy noted within the muscles. There is edema seen within the vastus medialis musculature. The quadriceps and patellar tendon are intact. Soft tissues Overlying the vastus medialis musculature there is a large heterogeneous soft tissue hematoma measuring approximately 8.4 x 3.4 by 10.6 cm extending along the lower  extremity. Overlying subcutaneous edema is noted. Scattered vascular calcifications are seen. IMPRESSION: No acute osseous abnormality. Large soft tissue hematoma overlying the vastus medialis muscle extending along the proximal lower extremity Electronically Signed   By: Prudencio Pair M.D.   On: 07/28/2019 01:26   VAS [place] LOWER EXTREMITY VENOUS (DVT)  Result Date: 07/28/2019  Lower Venous DVT Study Indications: Swelling, and Open wound on knee and distal thigh.  Limitations: Open wound on knee and distal thigh. Comparison Study: no prior study on file Performing Technologist: Sharion Dove RVS  Examination Guidelines: A complete evaluation includes B-mode imaging, spectral Doppler, color Doppler, and power Doppler as needed of all accessible portions of each vessel. Bilateral testing is considered an integral part of a complete examination. Limited examinations for reoccurring indications may be performed as noted. The reflux portion of the exam is performed with the patient in reverse Trendelenburg.  +---------+---------------+---------+-----------+----------+--------------+ RIGHT    CompressibilityPhasicitySpontaneityPropertiesThrombus Aging +---------+---------------+---------+-----------+----------+--------------+ CFV      Full           Yes      Yes                                 +---------+---------------+---------+-----------+----------+--------------+ SFJ      Full                                                        +---------+---------------+---------+-----------+----------+--------------+ FV Prox  Full                                                        +---------+---------------+---------+-----------+----------+--------------+ FV Mid   Full                                                        +---------+---------------+---------+-----------+----------+--------------+ FV DistalFull                                                         +---------+---------------+---------+-----------+----------+--------------+  PFV      Full                                                        +---------+---------------+---------+-----------+----------+--------------+ POP      Full           Yes      Yes                                 +---------+---------------+---------+-----------+----------+--------------+ PTV      Full                                                        +---------+---------------+---------+-----------+----------+--------------+ PERO     Full                                                        +---------+---------------+---------+-----------+----------+--------------+   +----+---------------+---------+-----------+----------+--------------+ LEFTCompressibilityPhasicitySpontaneityPropertiesThrombus Aging +----+---------------+---------+-----------+----------+--------------+ CFV Full           Yes      Yes                                 +----+---------------+---------+-----------+----------+--------------+     Summary: RIGHT: - There is no evidence of deep vein thrombosis in the lower extremity.  - Ultrasound characteristics of enlarged lymph nodes are noted in the groin.  LEFT: - No evidence of common femoral vein obstruction.  *See table(s) above for measurements and observations.    Preliminary     Review of Systems  Constitutional: Negative for chills, diaphoresis and fever.  HENT: Negative for ear discharge, ear pain, hearing loss and tinnitus.   Eyes: Negative for photophobia and pain.  Respiratory: Negative for cough and shortness of breath.   Cardiovascular: Negative for chest pain.  Gastrointestinal: Negative for abdominal pain, nausea and vomiting.  Genitourinary: Negative for dysuria, flank pain, frequency and urgency.  Musculoskeletal: Positive for arthralgias (Right knee). Negative for back pain, myalgias and neck pain.  Neurological: Negative for dizziness and headaches.   Hematological: Does not bruise/bleed easily.  Psychiatric/Behavioral: The patient is not nervous/anxious.    Blood pressure 118/80, pulse 62, temperature 98.1 F (36.7 C), resp. rate 12, height 6' (1.829 m), weight 90.7 kg, SpO2 96 %. Physical Exam  Constitutional: He appears well-developed and well-nourished. No distress.  HENT:  Head: Normocephalic and atraumatic.  Eyes: Conjunctivae are normal. Right eye exhibits no discharge. Left eye exhibits no discharge. No scleral icterus.  Cardiovascular: Normal rate and regular rhythm.  Respiratory: Effort normal. No respiratory distress.  Musculoskeletal:     Cervical back: Normal range of motion.     Comments: RLE No ecchymosis   Large healing abrasion medial knee, no odor or discharge  No knee or ankle effusion but medial knee hematoma palpable  Knee stable to varus/ valgus and anterior/posterior stress, able to AROM 60-180 degrees without pain  Sens DPN,  SPN, TN intact  Motor EHL, ext, flex, evers 5/5  DP 1+, PT 0, No significant edema  Neurological: He is alert.  Skin: Skin is warm and dry. He is not diaphoretic.  Psychiatric: He has a normal mood and affect. His behavior is normal.    Assessment/Plan: RLE cellulitis -- No e/o septic arthritis or significant effusion. Would not recommend arthrocentesis at this time. Would consider WOC RN consultation and referral to wound care center upon discharge. Orthopedic f/u may be as needed. 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

## 2019-07-28 NOTE — ED Notes (Signed)
The pt has been awake all night watching tv

## 2019-07-28 NOTE — Progress Notes (Signed)
VASCULAR LAB PRELIMINARY  PRELIMINARY  PRELIMINARY  PRELIMINARY  Right lower extremity venous duplex completed.    Preliminary report:  See CV proc for preliminary results.  Mubarak Bevens, RVT 07/28/2019, 10:10 AM

## 2019-07-28 NOTE — Consult Note (Signed)
WOC Nurse Consult Note: Patient receiving care in [facility] ED12. Reason for Consult: right knee wound Wound type: traumatic injury from fall Pressure Injury POA: Yes/No/NA Measurement: 13 cm x 8 cm Wound bed: 100% eschar Drainage (amount, consistency, odor) none Periwound: indurated, edematous, discolored.  See photos Dressing procedure/placement/frequency: I have order santyl to enzymatically debride the wound and PT hydrotherapy to hasten removal of non-viable tissue. Apply Santyl to the right knee wound in a nickel thick layer. Cover with a saline moistened gauze, then dry gauze and ABD pad--secure with kerlex.  Change daily. Monitor the wound area(s) for worsening of condition such as: Signs/symptoms of infection,  Increase in size,  Development of or worsening of odor, Development of pain, or increased pain at the affected locations.  Notify the medical team if any of these develop.  Thank you for the consult. WOC nurse will not follow at this time.  Please re-consult the WOC team if needed.  Helmut Muster, RN, MSN, CWOCN, CNS-BC, pager [phone]

## 2019-07-28 NOTE — H&P (Signed)
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  Patient explains that last Friday he was walking through his kitchen when he suddenly fell.  Denies any lightheadedness or loss of consciousness preceding the fall.  Patient denies striking his head.  Patient states that he struck his right knee on the carpet, causing a "rug burn" resulting in significant bleeding and development of a large wound.  On Saturday, 5/10 the patient presented to [facility] emergency department for evaluation of the wound.  At that time, due to the extensive nature of the wound and surrounding erythema with warmth there was concern for soft tissue infection and the patient was advised that he needed to be admitted for intravenous antibiotic therapy and wound care.  Patient refused and left AGAINST MEDICAL ADVICE at that time.  Over the next several days patient experienced progressively worsening redness and swelling.  Patient describes moderate, sharp pains located in the right leg and knee that is radiating proximally and worse with weightbearing or ambulation.  Patient states that the pain gets so severe that he is unable to ambulate at this time.  Patient denies fevers, generalized weakness, change in appetite.  It is because of patient's persisting pain and inability to ambulate prompted the patient to return to [facility] emergency  department for evaluation.  Upon evaluation in the emergency department patient was again found to have an extensive right medial knee wound with progressively worsening surrounding erythema warmth and induration.  Dr. August Saucer with orthopedic surgery was contacted who agreed to come in and evaluate the patient in the morning to evaluate the knee for possible development of septic arthritis and potentially perform an arthrocentesis.  Patient was initiated on intravenous vancomycin.  Blood cultures were obtained.  The hospitalist group was then called to assess the patient for admission the hospital.  Review of Systems: A 10-system review of systems has been performed and all systems are negative with the exception of what is listed in the HPI.    Past Medical History:  Diagnosis Date  . Abnormality of gait 06/14/2014  . Acute CVA (cerebrovascular accident) (HCC) 05/24/2014  . Acute kidney failure, unspecified (HCC)   . Acute, but ill-defined, cerebrovascular disease   . Alcohol abuse   . Altered mental status   . Atrial fibrillation (HCC)   . Chronic systolic CHF (congestive heart failure) (HCC)   . CVA (cerebral infarction)   . ETOH abuse   . Hyperlipidemia   . Hypertension   . Nonspecific elevation of levels of transaminase or lactic acid dehydrogenase (LDH)   . Rhabdomyolysis   . Tobacco abuse   . Tobacco use disorder     Past Surgical History:  Procedure Laterality Date  . BACK SURGERY  10 years ago   "slipped disc" repair  . EYE MUSCLE SURGERY     as a teenager "tighten his muscles"     reports that he has quit smoking. His smoking use included cigarettes. He  has a 22.50 pack-year smoking history. He has never used smokeless tobacco. He reports current alcohol use. He reports that he does not use drugs.  Allergies  Allergen Reactions  . Codeine Rash  . Penicillins Rash    Family History  Problem Relation Age of Onset  . Cancer Mother   . Cancer Father   . Multiple  sclerosis Daughter      Prior to Admission medications   Medication Sig Start Date End Date Taking? Authorizing Provider  atorvastatin (LIPITOR) 20 MG tablet Take 1 tablet (20 mg total) by mouth at bedtime. Patient not taking: Reported on 07/27/2019 03/12/18   Rai, Delene Ruffini, MD  carvedilol (COREG) 3.125 MG tablet Take 1 tablet (3.125 mg total) by mouth 2 (two) times daily with a meal. Patient not taking: Reported on 07/27/2019 03/12/18   Rai, Delene Ruffini, MD  hydrALAZINE (APRESOLINE) 50 MG tablet Take 1 tablet (50 mg total) by mouth 3 (three) times daily. Patient not taking: Reported on 07/27/2019 03/12/18   Cathren Harsh, MD  loperamide (IMODIUM) 2 MG capsule Take 1 capsule (2 mg total) by mouth as needed for diarrhea or loose stools. Patient not taking: Reported on 07/27/2019 03/12/18   Cathren Harsh, MD  thiamine 100 MG tablet Take 1 tablet (100 mg total) by mouth daily. Patient not taking: Reported on 07/27/2019 03/12/18   Cathren Harsh, MD    Physical Exam: Vitals:   07/27/19 2250 07/27/19 2251 07/27/19 2315 07/27/19 2330  BP:   (!) 150/91 (!) 142/86  Pulse: 67 66 69 67  Resp: 16 17 (!) 21 20  Temp:      TempSrc:      SpO2: 99% 99% 99% 99%  Weight:      Height:        Constitutional: Acute alert and oriented x3, no associated distress.   Skin: Large shallow wound located in the lateral surface of the right knee with an irregular border in excess of 10 cm in diameter.  Significant surrounding erythema warmth and induration that extends down the right lower extremity.  Dry scaly skin noted of the anterior surfaces of the bilateral lower extremities.  Or skin turgor noted. Eyes: Pupils are equally reactive to light.  No evidence of scleral icterus or conjunctival pallor.  ENMT: Slightly dry mucous membranes noted.  Posterior pharynx clear of any exudate or lesions.   Neck: normal, supple, no masses, no thyromegaly.  No evidence of jugular venous distension.   Respiratory:  clear to auscultation bilaterally, no wheezing, no crackles. Normal respiratory effort. No accessory muscle use.  Cardiovascular: Notable irregularly irregular rhythm, no murmurs / rubs / gallops.  Severe edema noted of the right lower extremity.  2+ pedal pulses. No carotid bruits.  Chest:   Nontender without crepitus or deformity.   Back:   Nontender without crepitus or deformity. Abdomen: Abdomen is soft and nontender.  No evidence of intra-abdominal masses.  Positive bowel sounds noted in all quadrants.   Musculoskeletal: Extensive edema no joint deformity upper and lower extremities, please see skin examination findings above for further detail.  Notable pain with both passive and active range of motion of the right knee.  Notable right knee effusion on examination.no contractures. Normal muscle tone.  Neurologic: sensation is grossly intact.  Patient is moving all 4 extremities spontaneously.  Patient is following all commands.  Patient is responsive to verbal stimuli.   Psychiatric: Patient presents as a normal mood with appropriate affect.  Patient  seems to possess insight as to theircurrent situation.     Labs on Admission: I have personally reviewed following labs and imaging studies -   CBC: Recent Labs  Lab 07/25/19 1202 07/27/19 1631  WBC 8.4 7.0  NEUTROABS 5.2 4.3  HGB 11.7* 13.0  HCT 35.8* 42.0  MCV 104.7* 107.7*  PLT 176 287   Basic Metabolic Panel: Recent Labs  Lab 07/25/19 1202 07/27/19 1631  NA 142 139  K 4.3 4.9  CL 109 107  CO2 25 20*  GLUCOSE 101* 94  BUN 23 23  CREATININE 1.88* 2.03*  CALCIUM 8.8* 9.2   GFR: Estimated Creatinine Clearance: 35 mL/min (A) (by C-G formula based on SCr of 2.03 mg/dL (H)). Liver Function Tests: Recent Labs  Lab 07/25/19 1202 07/27/19 1631  AST 15 21  ALT 11 9  ALKPHOS 61 66  BILITOT 1.5* 1.8*  PROT 6.9 7.3  ALBUMIN 3.4* 3.4*   No results for input(s): LIPASE, AMYLASE in the last 168 hours. No results for  input(s): AMMONIA in the last 168 hours. Coagulation Profile: No results for input(s): INR, PROTIME in the last 168 hours. Cardiac Enzymes: No results for input(s): CKTOTAL, CKMB, CKMBINDEX, TROPONINI in the last 168 hours. BNP (last 3 results) No results for input(s): PROBNP in the last 8760 hours. HbA1C: No results for input(s): HGBA1C in the last 72 hours. CBG: No results for input(s): GLUCAP in the last 168 hours. Lipid Profile: No results for input(s): CHOL, HDL, LDLCALC, TRIG, CHOLHDL, LDLDIRECT in the last 72 hours. Thyroid Function Tests: No results for input(s): TSH, T4TOTAL, FREET4, T3FREE, THYROIDAB in the last 72 hours. Anemia Panel: No results for input(s): VITAMINB12, FOLATE, FERRITIN, TIBC, IRON, RETICCTPCT in the last 72 hours. Urine analysis:    Component Value Date/Time   COLORURINE YELLOW 03/10/2018 1017   APPEARANCEUR CLOUDY (A) 03/10/2018 1017   LABSPEC 1.016 03/10/2018 1017   PHURINE 5.0 03/10/2018 1017   GLUCOSEU NEGATIVE 03/10/2018 1017   HGBUR MODERATE (A) 03/10/2018 1017   BILIRUBINUR NEGATIVE 03/10/2018 1017   KETONESUR NEGATIVE 03/10/2018 1017   PROTEINUR NEGATIVE 03/10/2018 1017   UROBILINOGEN 0.2 05/24/2014 0548   NITRITE POSITIVE (A) 03/10/2018 1017   LEUKOCYTESUR LARGE (A) 03/10/2018 1017    Radiological Exams on Admission - Personally Reviewed: No results found.  Telemetry: Personally reviewed.  Rhythm is atrial fibrillation with heart rate in the 70s with occasional PVCs.  Assessment/Plan Principal Problem:   Cellulitis of right lower extremity   Extensive cellulitis of the right lower extremity that extends from the wound of the medial right knee all the way down through the right leg.  While there is no evidence of obvious fluctuance to suggest abscess, the proximity of the extensive wound to the right knee joint recent concerns for possible septic arthritis or septic bursitis.  Case is already been discussed with Dr. Marlou Sa by the  emergency department staff was agreed to come evaluate the patient in the morning.  Orthopedic surgery can be formally consulted then.   Surprisingly, patient did not exhibit fever or leukocytosis at this time.  Regardless, considering extensive manifestation of cellulitis on examination, treating patient with intravenous trioxide and vancomycin.    Hydrating patient gently with intravenous isotonic fluids  Blood cultures have been obtained  CT imaging of right knee pending  Ultrasound of the right lower extremity pending to ensure there is no concurrent DVT.  Considering significant right knee wound wound care consultation has been ordered.  PT evaluation also ordered  considering limited ability to ambulate.  Active Problems:   Alcohol abuse   Patient has a well-documented history of alcohol abuse in the past however currently only admits to drinking 1 beer daily.  Will place patient on CIWA protocol with as needed benzodiazepines  No current evidence of alcohol withdrawal    Chronic systolic CHF (congestive heart failure) (HCC)  Known history of chronic systolic congestive heart failure with ejection fraction of 40 to 45% noted on echocardiogram in February 2017.  No cardiogenic evidence of volume overload at this time    Chronic atrial fibrillation [facility]   Patient is currently in atrial fibrillation with heart rate ranging in the 70s.  Patient has historically and is currently refusing any systemic anticoagulation  Patient seems rate controlled without AV nodal blocking agents  We will continue to monitor patient on telemetry  Of note, patient has experienced at least 2 previous strokes thought to be cardioembolic in origin due to atrial fibrillation    Chronic kidney disease, stage 3b   Creatinine near baseline  Strict input and output monitoring  Renal function electrolytes with serial chemistries    Nicotine dependence, cigarettes,  uncomplicated   Counseled patient daily on cessation    Mixed hyperlipidemia    Patient has historically refused any prescription medications, including statin therapy.  Patient has agreed to allow me to restart his home regimen of high-dose statin therapy during this hospitalization which we will hopefully be able to send patient home on time of discharge  History of CVA   After any potential arthrocentesis or orthopedic intervention, plan on placing patient on daily aspirin therapy which we can hopefully send patient home on.  Patient has historically refused antiplatelet therapy in the past despite history of multiple strokes  Additionally, patient has refused chronic anticoagulation for history of atrial fibrillation  Code Status:  Full code Family Communication: Deferred, patient declines  Status is: Observation  The patient remains OBS appropriate and will d/c before 2 midnights.  Dispo: The patient is from: Home              Anticipated d/c is to: Home              Anticipated d/c date is: 2 days              Patient currently is not medically stable to d/c.        Marinda Elk MD [facility] Pager [phone]  If 7PM-7AM, please contact night-coverage [email or web address] Use universal [place] password for that web site. If you do not have the password, please call the hospital operator.  07/28/2019, 12:53 AM

## 2019-07-28 NOTE — Plan of Care (Signed)
  Problem: Education: Goal: Knowledge of General Education information will improve Description: Including pain rating scale, medication(s)/side effects and non-pharmacologic comfort measures Outcome: Progressing   Problem: Clinical Measurements: Goal: Respiratory complications will improve Outcome: Progressing   Problem: Nutrition: Goal: Adequate nutrition will be maintained Outcome: Progressing   Problem: Pain Managment: Goal: General experience of comfort will improve Outcome: Progressing   Problem: Safety: Goal: Ability to remain free from injury will improve Outcome: Progressing   

## 2019-07-28 NOTE — Progress Notes (Signed)
Progress Note    Brian Mclaughlin  IWP:[id number] DOB: September 23, 1945  DOA: 07/27/2019 PCP: Patient, No Pcp Per    Brief Narrative:     Medical records reviewed and are as summarized below:  Brian Mclaughlin is an 74 y.o. male with a past medical history significant for EtOH abuse, chronic A. fib not on anticoagulation, chronic systolic heart failure, hyperlipidemia, hypertension, tobacco use, ongoing noncompliance, multiple strokes admitted May 13 with cellulitis of the right knee concerning for septic joint  Assessment/Plan:   Principal Problem:   Cellulitis of right lower extremity Active Problems:   Chronic atrial fibrillation (HCC)   HTN (hypertension)   Alcohol abuse   Chronic systolic CHF (congestive heart failure) (HCC)   Chronic kidney disease, stage 3b   Nicotine dependence, cigarettes, uncomplicated   Mixed hyperlipidemia   #1.  Cellulitis of the right lower extremity/knee wound.  Reportedly as a result of a mechanical fall 1 week ago.  CT of the right lower extremity reveals no acute osseous abnormality, large soft tissue hematoma overlying vastus medialis muscle thinning along the proximal lower extremity.  Rocephin and vancomycin initiated in the emergency department.  He is afebrile hemodynamically stable and nontoxic-appearing -Continue IV antibiotics -Follow blood cultures -Follow Doppler - Hydrotherapy and dressing changes per [facility]'s recommendation -Further imaging/management per orthopedics -PT consult  #2.  Chronic atrial fibrillation.  Rate controlled.  Patient is not on any rate control medications or any anticoagulation.  He denies any medications at all and states he has not seen a doctor "in a very long time."  Chart review indicates a long history of noncompliance.  Of note he was seen in the emergency department 3 days ago for current issue and left AMA. -Monitor on telemetry -Plan to resume aspirin once evaluated by orthopedics  #3.  Chronic systolic heart  failure.  Appears compensated.  No medications as noted above.  Chart review indicates ejection fraction of 40 to 45% from echo done in 2017. -Daily weights -Monitor intake and output  #4.  Chronic kidney disease stage III.  As noted above patient lost to outpatient follow-up.  His potassium is 5.6.  Creatinine 1.78.  Difficult to know his baseline.  1 year ago creatinine 2.1.  Frankly on exam he looks a little dry.  Urine is quite concentrated -Monitor -gentle IV fluids  #5.  EtOH use.  Patient reports 1 beer a day.  No sign symptoms of withdrawal -CIWA protocol  6.  History of CVA.  Patient with a long history of noncompliance.  Has refused antiplatelet therapr.  Has chronic A. fib.  History of multiple strokes.  #7. Hyperkalemia. Mild. Likely related to above. Appears dry   Family Communication/Anticipated D/C date and plan/Code Status   DVT prophylaxis: Lovenox ordered. Code Status: Full Code.  Family Communication: left daughter voice mail Disposition Plan: Status is: Observation  The patient remains OBS appropriate and will d/c before 2 midnights.  Dispo: The patient is from: Home              Anticipated d/c is to: Home              Anticipated d/c date is: 2 days              Patient currently is not medically stable to d/c.    Medical Consultants:    orthopedics   Anti-Infectives:    Vancomycin 5/13>>  Rocephin 5/13>>  Subjective:   Lying in bed watching  TV.  Denies pain or discomfort.  Requesting food  Objective:    Vitals:   07/28/19 0600 07/28/19 0700 07/28/19 0800 07/28/19 0900  BP: 137/84 127/81 131/88 118/80  Pulse: 63 (!) 110 68 62  Resp: 18 17 20 12   Temp:      TempSrc:      SpO2: 95% 98% 95% 96%  Weight:      Height:       No intake or output data in the 24 hours ending 07/28/19 0941 Filed Weights   07/27/19 2211  Weight: 90.7 kg    Exam: General: Lying in bed somewhat unkept appearing no acute distress very poor dentition CV:  Irregularly irregular no murmur gallop or rub trace lower extremity edema bilaterally Respiratory: Breath sounds are distant slightly coarse I hear no crackles no increased work of breathing Abdomen: Obese soft positive bowel sounds throughout nontender to palpation no guarding or rebounding Musculoskeletal: Right knee with swelling and erythema as well as non-healing wound with  large amount of eschar tissue medial aspect no drainage no odor Neuro: Alert and oriented x3 speech slow but clear  Data Reviewed:   I have personally reviewed following labs and imaging studies:  Labs: Labs show the following:   Basic Metabolic Panel: Recent Labs  Lab 07/25/19 1202 07/25/19 1202 07/27/19 1631 07/28/19 0346  NA 142  --  139 139  K 4.3   < > 4.9 5.6*  CL 109  --  107 106  CO2 25  --  20* 22  GLUCOSE 101*  --  94 84  BUN 23  --  23 23  CREATININE 1.88*  --  2.03* 1.78*  CALCIUM 8.8*  --  9.2 8.8*  MG  --   --   --  2.2   < > = values in this interval not displayed.   GFR Estimated Creatinine Clearance: 40 mL/min (A) (by C-G formula based on SCr of 1.78 mg/dL (H)). Liver Function Tests: Recent Labs  Lab 07/25/19 1202 07/27/19 1631 07/28/19 0346  AST 15 21 36  ALT 11 9 11   ALKPHOS 61 66 59  BILITOT 1.5* 1.8* 2.2*  PROT 6.9 7.3 6.3*  ALBUMIN 3.4* 3.4* 2.9*   No results for input(s): LIPASE, AMYLASE in the last 168 hours. No results for input(s): AMMONIA in the last 168 hours. Coagulation profile Recent Labs  Lab 07/28/19 0346  INR 1.1    CBC: Recent Labs  Lab 07/25/19 1202 07/27/19 1631 07/28/19 0557  WBC 8.4 7.0 6.7  NEUTROABS 5.2 4.3 3.7  HGB 11.7* 13.0 12.2*  HCT 35.8* 42.0 37.8*  MCV 104.7* 107.7* 105.3*  PLT 176 198 187   Cardiac Enzymes: No results for input(s): CKTOTAL, CKMB, CKMBINDEX, TROPONINI in the last 168 hours. BNP (last 3 results) No results for input(s): PROBNP in the last 8760 hours. CBG: No results for input(s): GLUCAP in the last 168  hours. D-Dimer: No results for input(s): DDIMER in the last 72 hours. Hgb A1c: No results for input(s): HGBA1C in the last 72 hours. Lipid Profile: No results for input(s): CHOL, HDL, LDLCALC, TRIG, CHOLHDL, LDLDIRECT in the last 72 hours. Thyroid function studies: No results for input(s): TSH, T4TOTAL, T3FREE, THYROIDAB in the last 72 hours.  Invalid input(s): FREET3 Anemia work up: No results for input(s): VITAMINB12, FOLATE, FERRITIN, TIBC, IRON, RETICCTPCT in the last 72 hours. Sepsis Labs: Recent Labs  Lab 07/25/19 1202 07/27/19 1631 07/28/19 0557  WBC 8.4 7.0 6.7    Microbiology  Recent Results (from the past 240 hour(s))  SARS Coronavirus 2 by RT PCR (hospital order, performed in [facility] hospital lab) Nasopharyngeal Nasopharyngeal Swab     Status: None   Collection Time: 07/27/19 10:39 PM   Specimen: Nasopharyngeal Swab  Result Value Ref Range Status   SARS Coronavirus 2 NEGATIVE NEGATIVE Final    Comment: (NOTE) SARS-CoV-2 target nucleic acids are NOT DETECTED. The SARS-CoV-2 RNA is generally detectable in upper and lower respiratory specimens during the acute phase of infection. The lowest concentration of SARS-CoV-2 viral copies this assay can detect is 250 copies / mL. A negative result does not preclude SARS-CoV-2 infection and should not be used as the sole basis for treatment or other patient management decisions.  A negative result may occur with improper specimen collection / handling, submission of specimen other than nasopharyngeal swab, presence of viral mutation(s) within the areas targeted by this assay, and inadequate number of viral copies (<250 copies / mL). A negative result must be combined with clinical observations, patient history, and epidemiological information. Fact Sheet for Patients:   BoilerBrush.com.cy Fact Sheet for Healthcare Providers: https://pope.com/ This test is not yet approved  or cleared  by the [place] FDA and has been authorized for detection and/or diagnosis of SARS-CoV-2 by FDA under an Emergency Use Authorization (EUA).  This EUA will remain in effect (meaning this test can be used) for the duration of the COVID-19 declaration under Section 564(b)(1) of the Act, 21 U.S.C. section 360bbb-3(b)(1), unless the authorization is terminated or revoked sooner. Performed at [facility], [address], [place], [place] [address]     Procedures and diagnostic studies:  CT Knee Right Wo Contrast  Result Date: 07/28/2019 CLINICAL DATA:  Knee pain, recent fall EXAM: CT OF THE right  KNEE WITHOUT CONTRAST TECHNIQUE: Multidetector CT imaging of the right knee was performed according to the standard protocol. Multiplanar CT image reconstructions were also generated. COMPARISON:  Jul 25, 2019 FINDINGS: Bones/Joint/Cartilage No fracture or dislocation. There is diffuse osteopenia. 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 Slight lateral subluxation of the patella is noted. No large knee joint effusion is seen. Ligaments Suboptimally assessed by CT. Muscles and Tendons Mild fatty atrophy noted within the muscles. There is edema seen within the vastus medialis musculature. The quadriceps and patellar tendon are intact. Soft tissues Overlying the vastus medialis musculature there is a large heterogeneous soft tissue hematoma measuring approximately 8.4 x 3.4 by 10.6 cm extending along the lower extremity. Overlying subcutaneous edema is noted. Scattered vascular calcifications are seen. IMPRESSION: No acute osseous abnormality. Large soft tissue hematoma overlying the vastus medialis muscle extending along the proximal lower extremity Electronically Signed   By: Jonna Clark M.D.   On: 07/28/2019 01:26    Medications:    atorvastatin  80 mg Oral Daily   collagenase    Topical Daily   [START ON 07/29/2019] enoxaparin (LOVENOX) injection  40 mg Subcutaneous Q24H   folic acid  1 mg Oral Daily   multivitamin with minerals  1 tablet Oral Daily   sodium zirconium cyclosilicate  10 g Oral Daily   thiamine  100 mg Oral Daily   Or   thiamine  100 mg Intravenous Daily   Continuous Infusions:  cefTRIAXone (ROCEPHIN)  IV Stopped (07/28/19 0232)   lactated ringers 100 mL/hr at 07/28/19 0234   vancomycin       LOS: 0 days  Radene Gunning NP  [facility]   How to contact the [facility] Attending or Consulting provider [place] or covering provider during after hours [place], for this patient?  1. Check the care team in [facility] and look for a) attending/consulting [facility] provider listed and b) the [facility] team listed 2. Log into [email or web address] and use [place]'s universal password to access. If you do not have the password, please contact the hospital operator. 3. Locate the [facility] provider you are looking for under [facility] and page to a number that you can be directly reached. 4. If you still have difficulty reaching the provider, please page the [facility] (Director on Call) for the Hospitalists listed on amion for assistance.  07/28/2019, 9:41 AM

## 2019-07-28 NOTE — Evaluation (Signed)
Physical Therapy Evaluation Patient Details Name: Brian Mclaughlin MRN: [id number] DOB: 01/26/1946 Today's Date: 07/28/2019   History of Present Illness  Pt is a 74 y/o male admitted secondary to R knee cellulitis. Pt with recent fall with R knee abraision, however, left AMA. PMH includes a fib, alcohol abuse, CHF, HTN, and CVA.   Clinical Impression  Pt admitted secondary to problem above with deficits below. Pt requiring mod A to stand and pivot X2 this session. Poor safety awareness and slight impulsivity noted. Pt currently lives alone and feel he as increased risk for falls. Feel pt would benefit from SNF level therapies, however, will likely refuse. Will continue to follow acutely to maximize functional mobility independence and safety.     Follow Up Recommendations SNF;Supervision/Assistance - 24 hour(may refuse; if he refuses will need max HH services)    Equipment Recommendations  Other (comment)(TBD)    Recommendations for Other Services       Precautions / Restrictions Precautions Precautions: Fall Restrictions Weight Bearing Restrictions: No      Mobility  Bed Mobility Overal bed mobility: Needs Assistance Bed Mobility: Sit to Supine       Sit to supine: Min guard   General bed mobility comments: Min guard for safety.   Transfers Overall transfer level: Needs assistance Equipment used: 1 person hand held assist Transfers: Sit to/from [facility] Sit to Stand: Mod assist Stand pivot transfers: Mod assist       General transfer comment: Mod A for lift assist and steadying to perform stand pivot to WC from commode and from [facility] to stretcher. Pt attempting to prematurely sit prior to completion of transfer and required multimodal cues to perform entirety of transfer.   Ambulation/Gait                Stairs            Wheelchair Mobility    Modified Rankin (Stroke Patients Only)       Balance Overall balance assessment: Needs  assistance Sitting-balance support: No upper extremity supported;Feet supported Sitting balance-Leahy Scale: Fair     Standing balance support: During functional activity;Single extremity supported Standing balance-Leahy Scale: Poor Standing balance comment: Reliant on external support.                              Pertinent Vitals/Pain Pain Assessment: Faces Faces Pain Scale: Hurts little more Pain Location: R knee Pain Descriptors / Indicators: Grimacing;Guarding Pain Intervention(s): Limited activity within patient's tolerance;Monitored during session;Repositioned    Home Living Family/patient expects to be discharged to:: Private residence Living Arrangements: Alone Available Help at Discharge: Friend(s);Available PRN/intermittently Type of Home: House Home Access: Stairs to enter Entrance Stairs-Rails: None Entrance Stairs-Number of Steps: 1 Home Layout: One level Home Equipment: Walker - 2 wheels      Prior Function Level of Independence: Independent with assistive device(s)         Comments: Has been having to use RW for mobility since having R knee injury.      Hand Dominance        Extremity/Trunk Assessment   Upper Extremity Assessment Upper Extremity Assessment: Defer to OT evaluation    Lower Extremity Assessment Lower Extremity Assessment: RLE deficits/detail RLE Deficits / Details: Limited ROM in R knee secondary to pain.     Cervical / Trunk Assessment Cervical / Trunk Assessment: Normal  Communication   Communication: No difficulties  Cognition Arousal/Alertness: Awake/alert  Behavior During Therapy: Impulsive Overall Cognitive Status: No family/caregiver present to determine baseline cognitive functioning                                 General Comments: Pt slightly impulsive with decreased safety awareness. Required multimodal cues for sequencing. Attempted to sit prematurely multiple times during session.        General Comments      Exercises     Assessment/Plan    PT Assessment Patient needs continued PT services  PT Problem List Decreased strength;Decreased balance;Decreased mobility;Decreased activity tolerance;Decreased cognition;Decreased safety awareness;Decreased knowledge of precautions;Pain       PT Treatment Interventions Gait training;DME instruction;Functional mobility training;Stair training;Therapeutic activities;Therapeutic exercise;Balance training;Patient/family education    PT Goals (Current goals can be found in the Care Plan section)  Acute Rehab PT Goals Patient Stated Goal: "to go to the bathroom"  PT Goal Formulation: With patient Time For Goal Achievement: 08/11/19 Potential to Achieve Goals: Good    Frequency Min 3X/week   Barriers to discharge        Co-evaluation               AM-PAC PT "6 Clicks" Mobility  Outcome Measure Help needed turning from your back to your side while in a flat bed without using bedrails?: A Little Help needed moving from lying on your back to sitting on the side of a flat bed without using bedrails?: A Little Help needed moving to and from a bed to a chair (including a wheelchair)?: A Lot Help needed standing up from a chair using your arms (e.g., wheelchair or bedside chair)?: A Lot Help needed to walk in hospital room?: A Lot Help needed climbing 3-5 steps with a railing? : Total 6 Click Score: 13    End of Session   Activity Tolerance: Patient tolerated treatment well Patient left: in bed;with call bell/phone within reach Nurse Communication: Mobility status PT Visit Diagnosis: Unsteadiness on feet (R26.81);Difficulty in walking, not elsewhere classified (R26.2);Other symptoms and signs involving the nervous system (R29.898)    Time: 1194-1740 PT Time Calculation (min) (ACUTE ONLY): 23 min   Charges:   PT Evaluation $PT Eval Moderate Complexity: 1 Mod PT Treatments $Therapeutic Activity: 8-22 mins         Cindee Salt, DPT  Acute Rehabilitation Services  Pager: [phone] Office: [phone]   [name] 07/28/2019, 3:04 PM

## 2019-07-28 NOTE — ED Notes (Signed)
Daughter, [name] would like an update [phone]

## 2019-07-29 DIAGNOSIS — I5022 Chronic systolic (congestive) heart failure: Secondary | ICD-10-CM

## 2019-07-29 DIAGNOSIS — F1010 Alcohol abuse, uncomplicated: Secondary | ICD-10-CM

## 2019-07-29 DIAGNOSIS — I10 Essential (primary) hypertension: Secondary | ICD-10-CM

## 2019-07-29 DIAGNOSIS — F17210 Nicotine dependence, cigarettes, uncomplicated: Secondary | ICD-10-CM

## 2019-07-29 DIAGNOSIS — N1832 Chronic kidney disease, stage 3b: Secondary | ICD-10-CM

## 2019-07-29 DIAGNOSIS — I482 Chronic atrial fibrillation, unspecified: Secondary | ICD-10-CM

## 2019-07-29 DIAGNOSIS — L03115 Cellulitis of right lower limb: Principal | ICD-10-CM

## 2019-07-29 LAB — CBC WITH DIFFERENTIAL/PLATELET
Abs Immature Granulocytes: 0.02 10*3/uL (ref 0.00–0.07)
Basophils Absolute: 0.1 10*3/uL (ref 0.0–0.1)
Basophils Relative: 1 %
Eosinophils Absolute: 0.3 10*3/uL (ref 0.0–0.5)
Eosinophils Relative: 4 %
HCT: 35.6 % — ABNORMAL LOW (ref 39.0–52.0)
Hemoglobin: 11.7 g/dL — ABNORMAL LOW (ref 13.0–17.0)
Immature Granulocytes: 0 %
Lymphocytes Relative: 31 %
Lymphs Abs: 2.2 10*3/uL (ref 0.7–4.0)
MCH: 34.3 pg — ABNORMAL HIGH (ref 26.0–34.0)
MCHC: 32.9 g/dL (ref 30.0–36.0)
MCV: 104.4 fL — ABNORMAL HIGH (ref 80.0–100.0)
Monocytes Absolute: 0.7 10*3/uL (ref 0.1–1.0)
Monocytes Relative: 10 %
Neutro Abs: 3.8 10*3/uL (ref 1.7–7.7)
Neutrophils Relative %: 54 %
Platelets: 184 10*3/uL (ref 150–400)
RBC: 3.41 MIL/uL — ABNORMAL LOW (ref 4.22–5.81)
RDW: 12.6 % (ref 11.5–15.5)
WBC: 7 10*3/uL (ref 4.0–10.5)
nRBC: 0 % (ref 0.0–0.2)

## 2019-07-29 LAB — BASIC METABOLIC PANEL
Anion gap: 8 (ref 5–15)
BUN: 21 mg/dL (ref 8–23)
CO2: 24 mmol/L (ref 22–32)
Calcium: 8.4 mg/dL — ABNORMAL LOW (ref 8.9–10.3)
Chloride: 105 mmol/L (ref 98–111)
Creatinine, Ser: 1.75 mg/dL — ABNORMAL HIGH (ref 0.61–1.24)
GFR calc Af Amer: 43 mL/min — ABNORMAL LOW (ref >60)
GFR calc non Af Amer: 38 mL/min — ABNORMAL LOW (ref >60)
Glucose, Bld: 91 mg/dL (ref 70–99)
Potassium: 4 mmol/L (ref 3.5–5.1)
Sodium: 137 mmol/L (ref 135–145)

## 2019-07-29 NOTE — Evaluation (Signed)
Occupational Therapy Evaluation Patient Details Name: Brian Mclaughlin MRN: [id number] DOB: 03/13/1946 Today's Date: 07/29/2019    History of Present Illness Pt is a 74 y/o male admitted secondary to R knee cellulitis. Pt with recent fall with R knee abraision, however, left AMA. PMH includes a fib, alcohol abuse, CHF, HTN, and CVA.    Clinical Impression   Pt reports living alone PTA, ambulating with a walker, preparing light meals and performing self care independently. Pt presents with impaired balance and generalize weakness. He requires moderate assistance to stand from elevated bed. Pt demonstrates decreased safety and awareness of deficits. Recommending ST rehab in SNF prior to return home. Pt states he will consider this plan.     Follow Up Recommendations  SNF;Supervision/Assistance - 24 hour    Equipment Recommendations  3 in 1 bedside commode    Recommendations for Other Services       Precautions / Restrictions Precautions Precautions: Fall Restrictions Weight Bearing Restrictions: No      Mobility Bed Mobility Overal bed mobility: Needs Assistance Bed Mobility: Sit to Supine;Supine to Sit     Supine to sit: Supervision;HOB elevated Sit to supine: Min assist   General bed mobility comments: min assist for LEs back into bed  Transfers Overall transfer level: Needs assistance Equipment used: Rolling walker (2 wheeled) Transfers: Sit to/from Stand Sit to Stand: Mod assist;From elevated surface         General transfer comment: cues for hand placement, assist to rise    Balance Overall balance assessment: Needs assistance   Sitting balance-Leahy Scale: Fair       Standing balance-Leahy Scale: Poor                             ADL either performed or assessed with clinical judgement   ADL Overall ADL's : Needs assistance/impaired Eating/Feeding: Independent   Grooming: Min guard;Standing;Wash/dry hands   Upper Body Bathing: Set  up;Sitting   Lower Body Bathing: Maximal assistance;Sit to/from stand   Upper Body Dressing : Set up;Sitting   Lower Body Dressing: Maximal assistance;Sit to/from stand   Toilet Transfer: Minimal assistance;Ambulation;RW   Toileting- Clothing Manipulation and Hygiene: Minimal assistance;Sit to/from stand       Functional mobility during ADLs: Minimal assistance;Rolling walker       Vision Patient Visual Report: No change from baseline       Perception     Praxis      Pertinent Vitals/Pain Pain Assessment: No/denies pain     Hand Dominance Right   Extremity/Trunk Assessment Upper Extremity Assessment Upper Extremity Assessment: Overall WFL for tasks assessed   Lower Extremity Assessment Lower Extremity Assessment: Defer to PT evaluation   Cervical / Trunk Assessment Cervical / Trunk Assessment: Normal   Communication Communication Communication: No difficulties   Cognition Arousal/Alertness: Awake/alert Behavior During Therapy: Impulsive Overall Cognitive Status: No family/caregiver present to determine baseline cognitive functioning                                 General Comments: decreased awareness of deficits and safety   General Comments       Exercises     Shoulder Instructions      Home Living Family/patient expects to be discharged to:: Private residence Living Arrangements: Alone Available Help at Discharge: Friend(s);Available PRN/intermittently Type of Home: House Home Access: Stairs to enter [employer] of  Steps: 1 Entrance Stairs-Rails: None Home Layout: One level     Bathroom Shower/Tub: Chief Strategy Officer: Standard     Home Equipment: Walker - 2 wheels          Prior Functioning/Environment Level of Independence: Independent with assistive device(s)        Comments: Has been having to use RW for mobility since having R knee injury.         OT Problem List: Decreased  strength;Decreased activity tolerance;Impaired balance (sitting and/or standing);Decreased cognition;Decreased safety awareness;Decreased knowledge of use of DME or AE      OT Treatment/Interventions: Self-care/ADL training;DME and/or AE instruction;Cognitive remediation/compensation;Patient/family education;Balance training    OT Goals(Current goals can be found in the care plan section) Acute Rehab OT Goals Patient Stated Goal: to get better OT Goal Formulation: With patient Time For Goal Achievement: 08/12/19 Potential to Achieve Goals: Good ADL Goals Pt Will Perform Grooming: standing;with min guard assist Pt Will Perform Lower Body Bathing: with min assist;with adaptive equipment;sit to/from stand Pt Will Perform Lower Body Dressing: with min assist;sit to/from stand;with adaptive equipment Pt Will Transfer to Toilet: ambulating;with supervision;bedside commode Pt Will Perform Toileting - Clothing Manipulation and hygiene: with min guard assist;sit to/from stand Additional ADL Goal #1: Pt will gather items necessary for ADL with RW around his room with min guard assist.  OT Frequency: Min 2X/week   Barriers to D/C: Decreased caregiver support          Co-evaluation              AM-PAC OT "6 Clicks" Daily Activity     Outcome Measure Help from another person eating meals?: None Help from another person taking care of personal grooming?: A Little Help from another person toileting, which includes using toliet, bedpan, or urinal?: A Little Help from another person bathing (including washing, rinsing, drying)?: A Lot Help from another person to put on and taking off regular upper body clothing?: A Little Help from another person to put on and taking off regular lower body clothing?: A Lot 6 Click Score: 17   End of Session    Activity Tolerance: Patient tolerated treatment well Patient left: in bed;with call bell/phone within reach;with bed alarm set  OT Visit  Diagnosis: Unsteadiness on feet (R26.81);Other abnormalities of gait and mobility (R26.89);Pain;Other symptoms and signs involving cognitive function;Muscle weakness (generalized) (M62.81)                Time: 1030-1045 OT Time Calculation (min): 15 min Charges:  OT General Charges $OT Visit: 1 Visit OT Evaluation $OT Eval Moderate Complexity: 1 Mod  Martie Round, OTR/L Acute Rehabilitation Services Pager: [phone] Office: [phone]  Evern Bio 07/29/2019, 12:11 PM

## 2019-07-29 NOTE — TOC Initial Note (Addendum)
Transition of Care [facility]) - Initial/Assessment Note    Patient Details  Name: Brian Mclaughlin MRN: [id number] Date of Birth: 1945-08-23  Transition of Care [facility]) CM/SW Contact:    Kingsley Plan, RN Phone Number: 07/29/2019, 12:19 PM  Clinical Narrative:                 Confirmed face sheet information with patient.  Discussed PT /OT recommendations with patient regarding SNF. Patient has been to [facility] and does not want to return.  Explained TOC could look at other SNF's. Patient declined , he does not want to go to any SNF.   NCM discussed HHPT, patient does not want home health either.   Patient has a walker and cane at home already. Discussed 3 in 1 , explained what a 3 in 1 can be used for, patient declined 3 in 1.   Patient states [facility] can provide 24/7 assistance at home.   [facility]   NCM discussed PCP with patient. He does not have a PCP at present. Explained [facility] can [facility], or call number on insurance card and be  provided a list of providers in network with his insurance. NCM offered to call [facility] to arrange a follow up appointment, Patient does NOT want NCM to schedule a follow up.   Orthopedics suggested follow up at [facility]. Explained same to patient and offered to call. Patient does not want NCM to call.      Expected Discharge Plan: Home/Self Care Barriers to Discharge: Continued Medical Work up   Patient Goals and CMS Choice Patient states their goals for this hospitalization and ongoing recovery are:: to go home CMS Medicare.gov Compare Post Acute Care list provided to:: Patient Choice offered to / list presented to : NA  Expected Discharge Plan and Services Expected Discharge Plan: Home/Self Care In-house Referral: NA     Living arrangements for the past 2 months: Apartment                 DME Arranged: N/A DME Agency: NA       HH Arranged: NA          Prior Living  Arrangements/Services Living arrangements for the past 2 months: Apartment Lives with:: Significant Other(Alicia Atkins) Patient language and need for interpreter reviewed:: Yes Do you feel safe going back to the place where you live?: Yes      Need for Family Participation in Patient Care: Yes (Comment) Care giver support system in place?: Yes (comment) Current home services: DME Criminal Activity/Legal Involvement Pertinent to Current Situation/Hospitalization: No - Comment as needed  Activities of Daily Living Home Assistive Devices/Equipment: Walker (specify type), Cane (specify quad or straight) ADL Screening (condition at time of admission) Patient's cognitive ability adequate to safely complete daily activities?: Yes Is the patient deaf or have difficulty hearing?: No Does the patient have difficulty seeing, even when wearing glasses/contacts?: No Does the patient have difficulty concentrating, remembering, or making decisions?: No Patient able to express need for assistance with ADLs?: Yes Does the patient have difficulty dressing or bathing?: No Independently performs ADLs?: Yes (appropriate for developmental age) Does the patient have difficulty walking or climbing stairs?: Yes Weakness of Legs: Right Weakness of Arms/Hands: None  Permission Sought/Granted   Permission granted to share information with : No              Emotional Assessment Appearance:: Appears stated age Attitude/Demeanor/Rapport: Engaged Affect (typically  observed): Accepting Orientation: : Oriented to Self, Oriented to Place, Oriented to  Time, Oriented to Situation Alcohol / Substance Use: Not Applicable Psych Involvement: No (comment)  Admission diagnosis:  Wound infection [T14.8XXA, L08.9] Cellulitis of right lower extremity [L03.115] Patient Active Problem List   Diagnosis Date Noted  . Nicotine dependence, cigarettes, uncomplicated 41/58/3094  . Mixed hyperlipidemia 07/28/2019  .  Hyperkalemia 07/28/2019  . Cellulitis of right lower extremity 07/25/2019  . Right sided weakness 03/10/2018  . Fever 03/10/2018  . Chronic kidney disease, stage 3b 03/10/2018  . Abnormality of gait 06/14/2014  . Chronic atrial fibrillation ([place])   . 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 Noncompliance with medications   . Acute ischemic stroke ([place]) 05/24/2014  . CVA (cerebral infarction) 05/24/2014  . Chronic systolic CHF (congestive heart failure) ([place])   . Stroke ([place]) 08/23/2011  . HTN (hypertension) 08/23/2011  . Alcohol abuse 08/23/2011  . LFTs abnormal 08/23/2011  . Acute renal failure ([place]) 08/23/2011  . Rhabdomyolysis 08/23/2011   PCP:  Patient, No Pcp Per Pharmacy:  No Pharmacies Listed    Social Determinants of Health (SDOH) Interventions    Readmission Risk Interventions No flowsheet data found.

## 2019-07-29 NOTE — Progress Notes (Signed)
Physical Therapy Wound Treatment Patient Details  Name: Brian Mclaughlin MRN: [id number] Date of Birth: October 01, 1945  Today's Date: 07/29/2019 Time: 1212-1238 Time Calculation (min): 26 min  Subjective  Subjective: pt eating lunch on entry, requests to finish before session  Patient and Family Stated Goals: go home Prior Treatments: none  Pain Score:  2/10 facial grimace with debridement   Wound Assessment  Wound / Incision (Open or Dehisced) 07/28/19 Other (Comment) Knee Right black scab like area (Active)  Wound Image   07/29/19 1300  Dressing Type ABD;Gauze (Comment);Other (Comment) 07/29/19 1300  Dressing Changed Changed 07/29/19 1300  Dressing Status Clean;Dry;Intact 07/29/19 1300  Dressing Change Frequency Daily 07/29/19 1300  Site / Wound Assessment Dry;Black 07/29/19 1300  % Wound base Red or Granulating 5% 07/29/19 1300  % Wound base Yellow/Fibrinous Exudate 5% 07/29/19 1300  % Wound base Black/Eschar 90% 07/29/19 1300  Peri-wound Assessment Edema;Erythema (blanchable) 07/29/19 1300  Wound Length (cm) 13 cm 07/29/19 1300  Wound Width (cm) 8 cm 07/29/19 1300  Wound Surface Area (cm^2) 104 cm^2 07/29/19 1300  Tunneling (cm) 0 07/28/19 1542  Margins Unattached edges (unapproximated) 07/29/19 1300  Closure None 07/29/19 1300  Drainage Amount Minimal 07/29/19 1300  Drainage Description Serosanguineous 07/29/19 1300  Treatment Debridement (Selective);Hydrotherapy (Pulse lavage);Packing (Saline gauze) 07/29/19 1300  Eschar crosshatched and santyl applied  Hydrotherapy Pulsed lavage therapy - wound location: R knee Pulsed Lavage with Suction (psi): 12 psi Pulsed Lavage with Suction - Normal Saline Used: 1000 mL Pulsed Lavage Tip: Tip with splash shield Selective Debridement Selective Debridement - Location: R knee Selective Debridement - Tools Used: Scalpel;Scissors;Forceps Selective Debridement - Tissue Removed: flaps of yellow brown, soften eschar loosened by santyl    Wound Assessment and Plan  Wound Therapy - Assess/Plan/Recommendations Wound Therapy - Clinical Statement: Pt wound eschar has started to loosen from Santyl application, thick eschar has been crosshatched to improve Santyl penetration. Pt will benefit from continued hydrotherapy and selective debridement to decrease bioburden and promote healing.  Wound Therapy - Functional Problem List: decreased weightbearing Factors Delaying/Impairing Wound Healing: Other (comment)(noncompliance to therapy) Hydrotherapy Plan: Debridement;Dressing change;Pulsatile lavage with suction;Patient/family education Wound Therapy - Frequency: 6X / week Wound Therapy - Follow Up Recommendations: Home health RN Wound Plan: see above  Wound Therapy Goals- Improve the function of patient's integumentary system by progressing the wound(s) through the phases of wound healing (inflammation - proliferation - remodeling) by: Decrease Necrotic Tissue to: 50 Increase Granulation Tissue to: 50 Decrease Length/Width/Depth by (cm): 10 Goals/treatment plan/discharge plan were made with and agreed upon by patient/family: Yes Time For Goal Achievement: 7 days Wound Therapy - Potential for Goals: Good  Goals will be updated until maximal potential achieved or discharge criteria met.  Discharge criteria: when goals achieved, discharge from hospital, MD decision/surgical intervention, no progress towards goals, refusal/missing three consecutive treatments without notification or medical reason.  GP    Dani Gobble. Migdalia Dk PT, DPT Acute Rehabilitation Services Pager [phone] Office [phone]  [place] 07/29/2019, 1:28 PM

## 2019-07-29 NOTE — Progress Notes (Signed)
Progress Note    Brian Mclaughlin  YDX:[id number] DOB: 04-04-1945  DOA: 07/27/2019 PCP: Patient, No Pcp Per    Brief Narrative:     Medical records reviewed and are as summarized below:  Brian Mclaughlin is an 74 y.o. male with a past medical history significant for EtOH abuse, chronic A. fib not on anticoagulation, chronic systolic heart failure, hyperlipidemia, hypertension, tobacco use, ongoing noncompliance, multiple strokes admitted May 13 with cellulitis of the right knee concerning for septic joint  Assessment/Plan:   Principal Problem:   Cellulitis of right lower extremity Active Problems:   HTN (hypertension)   Alcohol abuse   Chronic systolic CHF (congestive heart failure) (HCC)   Chronic atrial fibrillation (HCC)   Chronic kidney disease, stage 3b   Nicotine dependence, cigarettes, uncomplicated   Mixed hyperlipidemia   Hyperkalemia   Cellulitis of the right lower extremity/knee wound Currently afebrile, with no leukocytosis BC x2 NGTD CT of the right lower extremity reveals no acute osseous abnormality, large soft tissue hematoma overlying vastus medialis muscle thinning along the proximal lower extremity Doppler negative for any DVT Orthopedics on board, no further recs, unlikely to be septic arthritis Continue IV antibiotics Hydrotherapy and dressing changes per W OC's recommendation PT/OT  Chronic kidney disease stage IIIb Difficult to know his baseline, 1 year ago creatinine 2.1 Daily BMP  Chronic systolic heart failure Appears compensated Chart review indicates ejection fraction of 40 to 45% from echo done in 2017 Daily weights, Monitor intake and output  Hx of Chronic atrial fibrillation Rate controlled Not on any anticoagulation, has refused in the past EKG pending  History of multiple CVA Refused antiplatelet therapy Continue statin   EtOH abuse Patient reports 1 beer a day.  No sign symptoms of withdrawal CIWA protocol    Family  Communication/Anticipated D/C date and plan/Code Status   DVT prophylaxis: Lovenox ordered. Code Status: Full Code.  Family Communication: left daughter voice mail Disposition Plan: Status is: Inpatient   Dispo: The patient is from: Home              Anticipated d/c is to: Home              Anticipated d/c date is: 2 days              Patient currently is not medically stable to d/c.    Medical Consultants:    orthopedics   Anti-Infectives:    Vancomycin 5/13>>  Rocephin 5/13>>  Subjective:   Patient denies any new complaints today.  Objective:    Vitals:   07/28/19 2001 07/29/19 0053 07/29/19 0528 07/29/19 1239  BP: 114/83 123/69 140/80 (!) 131/96  Pulse: 70 60 68 84  Resp: 18 18 18 18   Temp: 98.3 F (36.8 C) 98.7 F (37.1 C) 98.3 F (36.8 C) 98.4 F (36.9 C)  TempSrc: Oral Oral Oral Oral  SpO2: 97% 94% 98% 98%  Weight:      Height:        Intake/Output Summary (Last 24 hours) at 07/29/2019 1631 Last data filed at 07/29/2019 0300 Gross per 24 hour  Intake 760.26 ml  Output --  Net 760.26 ml   Filed Weights   07/27/19 2211  Weight: 90.7 kg    Exam:  General: NAD, poor dentition  Cardiovascular: S1, S2 present  Respiratory: CTAB  Abdomen: Soft, nontender, nondistended, bowel sounds present  Musculoskeletal: Right knee with erythema, as well as nonhealing wound with large amount of eschar tissue,  no drainage or odor noted no bilateral pedal edema noted  Skin:  As above  Psychiatry: Normal mood   Data Reviewed:   I have personally reviewed following labs and imaging studies:  Labs: Labs show the following:   Basic Metabolic Panel: Recent Labs  Lab 07/25/19 1202 07/25/19 1202 07/27/19 1631 07/27/19 1631 07/28/19 0346 07/29/19 0630  NA 142  --  139  --  139 137  K 4.3   < > 4.9   < > 5.6* 4.0  CL 109  --  107  --  106 105  CO2 25  --  20*  --  22 24  GLUCOSE 101*  --  94  --  84 91  BUN 23  --  23  --  23 21  CREATININE  1.88*  --  2.03*  --  1.78* 1.75*  CALCIUM 8.8*  --  9.2  --  8.8* 8.4*  MG  --   --   --   --  2.2  --    < > = values in this interval not displayed.   GFR Estimated Creatinine Clearance: 40.6 mL/min (A) (by C-G formula based on SCr of 1.75 mg/dL (H)). Liver Function Tests: Recent Labs  Lab 07/25/19 1202 07/27/19 1631 07/28/19 0346  AST 15 21 36  ALT 11 9 11   ALKPHOS 61 66 59  BILITOT 1.5* 1.8* 2.2*  PROT 6.9 7.3 6.3*  ALBUMIN 3.4* 3.4* 2.9*   No results for input(s): LIPASE, AMYLASE in the last 168 hours. No results for input(s): AMMONIA in the last 168 hours. Coagulation profile Recent Labs  Lab 07/28/19 0346  INR 1.1    CBC: Recent Labs  Lab 07/25/19 1202 07/27/19 1631 07/28/19 0557 07/29/19 0630  WBC 8.4 7.0 6.7 7.0  NEUTROABS 5.2 4.3 3.7 3.8  HGB 11.7* 13.0 12.2* 11.7*  HCT 35.8* 42.0 37.8* 35.6*  MCV 104.7* 107.7* 105.3* 104.4*  PLT 176 198 187 184   Cardiac Enzymes: No results for input(s): CKTOTAL, CKMB, CKMBINDEX, TROPONINI in the last 168 hours. BNP (last 3 results) No results for input(s): PROBNP in the last 8760 hours. CBG: No results for input(s): GLUCAP in the last 168 hours. D-Dimer: No results for input(s): DDIMER in the last 72 hours. Hgb A1c: No results for input(s): HGBA1C in the last 72 hours. Lipid Profile: No results for input(s): CHOL, HDL, LDLCALC, TRIG, CHOLHDL, LDLDIRECT in the last 72 hours. Thyroid function studies: No results for input(s): TSH, T4TOTAL, T3FREE, THYROIDAB in the last 72 hours.  Invalid input(s): FREET3 Anemia work up: No results for input(s): VITAMINB12, FOLATE, FERRITIN, TIBC, IRON, RETICCTPCT in the last 72 hours. Sepsis Labs: Recent Labs  Lab 07/25/19 1202 07/27/19 1631 07/28/19 0557 07/29/19 0630  WBC 8.4 7.0 6.7 7.0    Microbiology Recent Results (from the past 240 hour(s))  Culture, blood (routine x 2)     Status: None (Preliminary result)   Collection Time: 07/27/19 10:38 PM   Specimen:  BLOOD  Result Value Ref Range Status   Specimen Description BLOOD RIGHT ANTECUBITAL  Final   Special Requests   Final    BOTTLES DRAWN AEROBIC AND ANAEROBIC Blood Culture adequate volume   Culture   Final    NO GROWTH 2 DAYS Performed at [facility], [address] [place], [place] [id number]    Report Status PENDING  Incomplete  SARS Coronavirus 2 by RT PCR (hospital order, performed in [facility] hospital lab) Nasopharyngeal Nasopharyngeal Swab  Status: None   Collection Time: 07/27/19 10:39 PM   Specimen: Nasopharyngeal Swab  Result Value Ref Range Status   SARS Coronavirus 2 NEGATIVE NEGATIVE Final    Comment: (NOTE) SARS-CoV-2 target nucleic acids are NOT DETECTED. The SARS-CoV-2 RNA is generally detectable in upper and lower respiratory specimens during the acute phase of infection. The lowest concentration of SARS-CoV-2 viral copies this assay can detect is 250 copies / mL. A negative result does not preclude SARS-CoV-2 infection and should not be used as the sole basis for treatment or other patient management decisions.  A negative result may occur with improper specimen collection / handling, submission of specimen other than nasopharyngeal swab, presence of viral mutation(s) within the areas targeted by this assay, and inadequate number of viral copies (<250 copies / mL). A negative result must be combined with clinical observations, patient history, and epidemiological information. Fact Sheet for Patients:   BoilerBrush.com.cy Fact Sheet for Healthcare Providers: https://pope.com/ This test is not yet approved or cleared  by the [place] FDA and has been authorized for detection and/or diagnosis of SARS-CoV-2 by FDA under an Emergency Use Authorization (EUA).  This EUA will remain in effect (meaning this test can be used) for the duration of the COVID-19 declaration under Section 564(b)(1) of the Act, 21  U.S.C. section 360bbb-3(b)(1), unless the authorization is terminated or revoked sooner. Performed at [facility], [address], [place], [place] [address]   MRSA PCR Screening     Status: None   Collection Time: 07/28/19 10:31 AM   Specimen: Nasopharyngeal  Result Value Ref Range Status   MRSA by PCR NEGATIVE NEGATIVE Final    Comment:        The GeneXpert MRSA Assay (FDA approved for NASAL specimens only), is one component of a comprehensive MRSA colonization surveillance program. It is not intended to diagnose MRSA infection nor to guide or monitor treatment for MRSA infections. Performed at [facility], [address] [place], [place] [address]     Procedures and diagnostic studies:  CT Knee Right Wo Contrast  Result Date: 07/28/2019 CLINICAL DATA:  Knee pain, recent fall EXAM: CT OF THE right  KNEE WITHOUT CONTRAST TECHNIQUE: Multidetector CT imaging of the right knee was performed according to the standard protocol. Multiplanar CT image reconstructions were also generated. COMPARISON:  Jul 25, 2019 FINDINGS: Bones/Joint/Cartilage No fracture or dislocation. There is diffuse osteopenia. 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 Slight lateral subluxation of the patella is noted. No large knee joint effusion is seen. Ligaments Suboptimally assessed by CT. Muscles and Tendons Mild fatty atrophy noted within the muscles. There is edema seen within the vastus medialis musculature. The quadriceps and patellar tendon are intact. Soft tissues Overlying the vastus medialis musculature there is a large heterogeneous soft tissue hematoma measuring approximately 8.4 x 3.4 by 10.6 cm extending along the lower extremity. Overlying subcutaneous edema is noted. Scattered vascular calcifications are seen. IMPRESSION: No acute osseous abnormality. Large soft tissue hematoma overlying the  vastus medialis muscle extending along the proximal lower extremity Electronically Signed   By: Jonna Clark M.D.   On: 07/28/2019 01:26   VAS [place] LOWER EXTREMITY VENOUS (DVT)  Result Date: 07/28/2019  Lower Venous DVT Study Indications: Swelling, and Open wound on knee and distal thigh.  Limitations: Open wound on knee and distal thigh. Comparison Study: no prior study on file Performing Technologist: Candace  Mauro Kaufmann RVS  Examination Guidelines: A complete evaluation includes B-mode imaging, spectral Doppler, color Doppler, and power Doppler as needed of all accessible portions of each vessel. Bilateral testing is considered an integral part of a complete examination. Limited examinations for reoccurring indications may be performed as noted. The reflux portion of the exam is performed with the patient in reverse Trendelenburg.  +---------+---------------+---------+-----------+----------+--------------+ RIGHT    CompressibilityPhasicitySpontaneityPropertiesThrombus Aging +---------+---------------+---------+-----------+----------+--------------+ CFV      Full           Yes      Yes                                 +---------+---------------+---------+-----------+----------+--------------+ SFJ      Full                                                        +---------+---------------+---------+-----------+----------+--------------+ FV Prox  Full                                                        +---------+---------------+---------+-----------+----------+--------------+ FV Mid   Full                                                        +---------+---------------+---------+-----------+----------+--------------+ FV DistalFull                                                        +---------+---------------+---------+-----------+----------+--------------+ PFV      Full                                                         +---------+---------------+---------+-----------+----------+--------------+ POP      Full           Yes      Yes                                 +---------+---------------+---------+-----------+----------+--------------+ PTV      Full                                                        +---------+---------------+---------+-----------+----------+--------------+ PERO     Full                                                        +---------+---------------+---------+-----------+----------+--------------+   +----+---------------+---------+-----------+----------+--------------+  LEFTCompressibilityPhasicitySpontaneityPropertiesThrombus Aging +----+---------------+---------+-----------+----------+--------------+ CFV Full           Yes      Yes                                 +----+---------------+---------+-----------+----------+--------------+     Summary: RIGHT: - There is no evidence of deep vein thrombosis in the lower extremity.  - Ultrasound characteristics of enlarged lymph nodes are noted in the groin.  LEFT: - No evidence of common femoral vein obstruction.  *See table(s) above for measurements and observations. Electronically signed by Lemar Livings MD on 07/28/2019 at 5:42:57 PM.    Final     Medications:   . atorvastatin  80 mg Oral Daily  . collagenase   Topical Daily  . enoxaparin (LOVENOX) injection  40 mg Subcutaneous Q24H  . folic acid  1 mg Oral Daily  . multivitamin with minerals  1 tablet Oral Daily  . sodium zirconium cyclosilicate  10 g Oral Daily  . thiamine  100 mg Oral Daily   Or  . thiamine  100 mg Intravenous Daily   Continuous Infusions: . cefTRIAXone (ROCEPHIN)  IV 1 g (07/28/19 2355)  . vancomycin 1,000 mg (07/28/19 2142)     LOS: 1 day   Briant Cedar MD  [facility]  07/29/2019, 4:31 PM

## 2019-07-30 LAB — CBC WITH DIFFERENTIAL/PLATELET
Abs Immature Granulocytes: 0.03 10*3/uL (ref 0.00–0.07)
Basophils Absolute: 0.1 10*3/uL (ref 0.0–0.1)
Basophils Relative: 1 %
Eosinophils Absolute: 0.3 10*3/uL (ref 0.0–0.5)
Eosinophils Relative: 4 %
HCT: 36.2 % — ABNORMAL LOW (ref 39.0–52.0)
Hemoglobin: 11.9 g/dL — ABNORMAL LOW (ref 13.0–17.0)
Immature Granulocytes: 0 %
Lymphocytes Relative: 29 %
Lymphs Abs: 2 10*3/uL (ref 0.7–4.0)
MCH: 33.7 pg (ref 26.0–34.0)
MCHC: 32.9 g/dL (ref 30.0–36.0)
MCV: 102.5 fL — ABNORMAL HIGH (ref 80.0–100.0)
Monocytes Absolute: 0.7 10*3/uL (ref 0.1–1.0)
Monocytes Relative: 10 %
Neutro Abs: 3.7 10*3/uL (ref 1.7–7.7)
Neutrophils Relative %: 56 %
Platelets: 184 10*3/uL (ref 150–400)
RBC: 3.53 MIL/uL — ABNORMAL LOW (ref 4.22–5.81)
RDW: 12.6 % (ref 11.5–15.5)
WBC: 6.7 10*3/uL (ref 4.0–10.5)
nRBC: 0 % (ref 0.0–0.2)

## 2019-07-30 LAB — LIPID PANEL
Cholesterol: 115 mg/dL (ref 0–200)
HDL: 40 mg/dL — ABNORMAL LOW (ref >40)
LDL Cholesterol: 62 mg/dL (ref 0–99)
Total CHOL/HDL Ratio: 2.9 RATIO
Triglycerides: 66 mg/dL (ref <150)
VLDL: 13 mg/dL (ref 0–40)

## 2019-07-30 LAB — BASIC METABOLIC PANEL
Anion gap: 7 (ref 5–15)
BUN: 25 mg/dL — ABNORMAL HIGH (ref 8–23)
CO2: 25 mmol/L (ref 22–32)
Calcium: 8.5 mg/dL — ABNORMAL LOW (ref 8.9–10.3)
Chloride: 103 mmol/L (ref 98–111)
Creatinine, Ser: 1.69 mg/dL — ABNORMAL HIGH (ref 0.61–1.24)
GFR calc Af Amer: 45 mL/min — ABNORMAL LOW (ref >60)
GFR calc non Af Amer: 39 mL/min — ABNORMAL LOW (ref >60)
Glucose, Bld: 93 mg/dL (ref 70–99)
Potassium: 4.2 mmol/L (ref 3.5–5.1)
Sodium: 135 mmol/L (ref 135–145)

## 2019-07-30 MED ORDER — SENNOSIDES-DOCUSATE SODIUM 8.6-50 MG PO TABS
1.0000 | ORAL_TABLET | Freq: Two times a day (BID) | ORAL | Status: DC
  Administered 2019-07-30 – 2019-07-31 (×3): 1 via ORAL
  Filled 2019-07-30 (×3): qty 1

## 2019-07-30 MED ORDER — ATORVASTATIN CALCIUM 40 MG PO TABS
40.0000 mg | ORAL_TABLET | Freq: Every day | ORAL | Status: DC
  Administered 2019-07-30 – 2019-07-31 (×2): 40 mg via ORAL
  Filled 2019-07-30 (×2): qty 1

## 2019-07-30 MED ORDER — POLYETHYLENE GLYCOL 3350 17 G PO PACK
17.0000 g | PACK | Freq: Every day | ORAL | Status: DC
  Administered 2019-07-30 – 2019-07-31 (×2): 17 g via ORAL
  Filled 2019-07-30 (×2): qty 1

## 2019-07-30 NOTE — Progress Notes (Signed)
Progress Note    Brian Mclaughlin  QIH:[id number] DOB: 1945/07/08  DOA: 07/27/2019 PCP: Patient, No Pcp Per    Brief Narrative:     Medical records reviewed and are as summarized below:  Brian Mclaughlin is an 74 y.o. male with a past medical history significant for EtOH abuse, chronic A. fib not on anticoagulation, chronic systolic heart failure, hyperlipidemia, hypertension, tobacco use, ongoing noncompliance, multiple strokes admitted May 13 with cellulitis of the right knee concerning for septic joint  Assessment/Plan:   Principal Problem:   Cellulitis of right lower extremity Active Problems:   HTN (hypertension)   Alcohol abuse   Chronic systolic CHF (congestive heart failure) (HCC)   Chronic atrial fibrillation (HCC)   Chronic kidney disease, stage 3b   Nicotine dependence, cigarettes, uncomplicated   Mixed hyperlipidemia   Hyperkalemia   Cellulitis of the right lower extremity/knee wound Currently afebrile, with no leukocytosis BC x2 NGTD CT of the right lower extremity reveals no acute osseous abnormality, large soft tissue hematoma overlying vastus medialis muscle thinning along the proximal lower extremity Doppler negative for any DVT Orthopedics on board, no further recs, unlikely to be septic arthritis Continue IV antibiotics Hydrotherapy and dressing changes per W OC's recommendation PT/OT  Chronic kidney disease stage IIIb Difficult to know his baseline, 1 year ago creatinine 2.1 Daily BMP  Chronic systolic heart failure Appears compensated Chart review indicates ejection fraction of 40 to 45% from echo done in 2017 Daily weights, Monitor intake and output  Hx of Chronic atrial fibrillation Rate controlled Not on any anticoagulation, has refused in the past EKG pending  History of multiple CVA Refused antiplatelet therapy Continue statin   EtOH abuse Patient reports 1 beer a day.  No sign symptoms of withdrawal CIWA protocol    Family  Communication/Anticipated D/C date and plan/Code Status   DVT prophylaxis: Lovenox ordered. Code Status: Full Code.  Family Communication: left daughter voice mail Disposition Plan: Status is: Inpatient   Dispo: The patient is from: Home              Anticipated d/c is to: Home              Anticipated d/c date is: 2 days              Patient currently is not medically stable to d/c. due to ongoing need for hydrotherapy by PT. Pt currently refusing HH services, but may be agreeable only to RN   Medical Consultants:    orthopedics   Anti-Infectives:    Vancomycin 5/13>>  Rocephin 5/13>>  Subjective:   Patient reports constipation, otherwise denied any new complaints.  Objective:    Vitals:   07/29/19 1745 07/30/19 0037 07/30/19 0538 07/30/19 1209  BP: 136/89 (!) 154/94 (!) 157/84 124/77  Pulse: 61 67 72 69  Resp: 18 18 18 16   Temp: 98.2 F (36.8 C) 99.1 F (37.3 C) 98.8 F (37.1 C) 98.3 F (36.8 C)  TempSrc: Oral Oral Oral Oral  SpO2: 100% 95% 97% 99%  Weight:      Height:        Intake/Output Summary (Last 24 hours) at 07/30/2019 1529 Last data filed at 07/30/2019 0600 Gross per 24 hour  Intake 240 ml  Output 1500 ml  Net -1260 ml   Filed Weights   07/27/19 2211  Weight: 90.7 kg    Exam:  General: NAD, poor dentition  Cardiovascular: S1, S2 present  Respiratory: CTAB  Abdomen:  Soft, nontender, nondistended, bowel sounds present  Musculoskeletal: Right knee with erythema, as well as nonhealing wound with improving amount of eschar tissue s/p hydrotherapy, no drainage or odor noted no bilateral pedal edema noted  Skin:  As above  Psychiatry: Normal mood   Data Reviewed:   I have personally reviewed following labs and imaging studies:  Labs: Labs show the following:   Basic Metabolic Panel: Recent Labs  Lab 07/25/19 1202 07/25/19 1202 07/27/19 1631 07/27/19 1631 07/28/19 0346 07/28/19 0346 07/29/19 0630 07/30/19 0257  NA 142   --  139  --  139  --  137 135  K 4.3   < > 4.9   < > 5.6*   < > 4.0 4.2  CL 109  --  107  --  106  --  105 103  CO2 25  --  20*  --  22  --  24 25  GLUCOSE 101*  --  94  --  84  --  91 93  BUN 23  --  23  --  23  --  21 25*  CREATININE 1.88*  --  2.03*  --  1.78*  --  1.75* 1.69*  CALCIUM 8.8*  --  9.2  --  8.8*  --  8.4* 8.5*  MG  --   --   --   --  2.2  --   --   --    < > = values in this interval not displayed.   GFR Estimated Creatinine Clearance: 42.1 mL/min (A) (by C-G formula based on SCr of 1.69 mg/dL (H)). Liver Function Tests: Recent Labs  Lab 07/25/19 1202 07/27/19 1631 07/28/19 0346  AST 15 21 36  ALT 11 9 11   ALKPHOS 61 66 59  BILITOT 1.5* 1.8* 2.2*  PROT 6.9 7.3 6.3*  ALBUMIN 3.4* 3.4* 2.9*   No results for input(s): LIPASE, AMYLASE in the last 168 hours. No results for input(s): AMMONIA in the last 168 hours. Coagulation profile Recent Labs  Lab 07/28/19 0346  INR 1.1    CBC: Recent Labs  Lab 07/25/19 1202 07/27/19 1631 07/28/19 0557 07/29/19 0630 07/30/19 0257  WBC 8.4 7.0 6.7 7.0 6.7  NEUTROABS 5.2 4.3 3.7 3.8 3.7  HGB 11.7* 13.0 12.2* 11.7* 11.9*  HCT 35.8* 42.0 37.8* 35.6* 36.2*  MCV 104.7* 107.7* 105.3* 104.4* 102.5*  PLT 176 198 187 184 184   Cardiac Enzymes: No results for input(s): CKTOTAL, CKMB, CKMBINDEX, TROPONINI in the last 168 hours. BNP (last 3 results) No results for input(s): PROBNP in the last 8760 hours. CBG: No results for input(s): GLUCAP in the last 168 hours. D-Dimer: No results for input(s): DDIMER in the last 72 hours. Hgb A1c: No results for input(s): HGBA1C in the last 72 hours. Lipid Profile: Recent Labs    07/30/19 0257  CHOL 115  HDL 40*  LDLCALC 62  TRIG 66  CHOLHDL 2.9   Thyroid function studies: No results for input(s): TSH, T4TOTAL, T3FREE, THYROIDAB in the last 72 hours.  Invalid input(s): FREET3 Anemia work up: No results for input(s): VITAMINB12, FOLATE, FERRITIN, TIBC, IRON, RETICCTPCT in  the last 72 hours. Sepsis Labs: Recent Labs  Lab 07/27/19 1631 07/28/19 0557 07/29/19 0630 07/30/19 0257  WBC 7.0 6.7 7.0 6.7    Microbiology Recent Results (from the past 240 hour(s))  Culture, blood (routine x 2)     Status: None (Preliminary result)   Collection Time: 07/27/19 10:38 PM   Specimen: BLOOD  Result Value Ref Range Status   Specimen Description BLOOD RIGHT ANTECUBITAL  Final   Special Requests   Final    BOTTLES DRAWN AEROBIC AND ANAEROBIC Blood Culture adequate volume   Culture   Final    NO GROWTH 3 DAYS Performed at [facility], [address], [place], [place] [address]    Report Status PENDING  Incomplete  SARS Coronavirus 2 by RT PCR (hospital order, performed in [facility] hospital lab) Nasopharyngeal Nasopharyngeal Swab     Status: None   Collection Time: 07/27/19 10:39 PM   Specimen: Nasopharyngeal Swab  Result Value Ref Range Status   SARS Coronavirus 2 NEGATIVE NEGATIVE Final    Comment: (NOTE) SARS-CoV-2 target nucleic acids are NOT DETECTED. The SARS-CoV-2 RNA is generally detectable in upper and lower respiratory specimens during the acute phase of infection. The lowest concentration of SARS-CoV-2 viral copies this assay can detect is 250 copies / mL. A negative result does not preclude SARS-CoV-2 infection and should not be used as the sole basis for treatment or other patient management decisions.  A negative result may occur with improper specimen collection / handling, submission of specimen other than nasopharyngeal swab, presence of viral mutation(s) within the areas targeted by this assay, and inadequate number of viral copies (<250 copies / mL). A negative result must be combined with clinical observations, patient history, and epidemiological information. Fact Sheet for Patients:   BoilerBrush.com.cy Fact Sheet for Healthcare Providers: https://pope.com/ This test is not yet  approved or cleared  by the [place] FDA and has been authorized for detection and/or diagnosis of SARS-CoV-2 by FDA under an Emergency Use Authorization (EUA).  This EUA will remain in effect (meaning this test can be used) for the duration of the COVID-19 declaration under Section 564(b)(1) of the Act, 21 U.S.C. section 360bbb-3(b)(1), unless the authorization is terminated or revoked sooner. Performed at [facility], [address], [place], [place] [address]   MRSA PCR Screening     Status: None   Collection Time: 07/28/19 10:31 AM   Specimen: Nasopharyngeal  Result Value Ref Range Status   MRSA by PCR NEGATIVE NEGATIVE Final    Comment:        The GeneXpert MRSA Assay (FDA approved for NASAL specimens only), is one component of a comprehensive MRSA colonization surveillance program. It is not intended to diagnose MRSA infection nor to guide or monitor treatment for MRSA infections. Performed at [facility], [address] [place], [place] [address]     Procedures and diagnostic studies:  No results found.  Medications:   . atorvastatin  40 mg Oral Daily  . collagenase   Topical Daily  . enoxaparin (LOVENOX) injection  40 mg Subcutaneous Q24H  . folic acid  1 mg Oral Daily  . multivitamin with minerals  1 tablet Oral Daily  . polyethylene glycol  17 g Oral Daily  . senna-docusate  1 tablet Oral BID  . sodium zirconium cyclosilicate  10 g Oral Daily  . thiamine  100 mg Oral Daily   Or  . thiamine  100 mg Intravenous Daily   Continuous Infusions: . cefTRIAXone (ROCEPHIN)  IV 1 g (07/30/19 0100)  . vancomycin 1,000 mg (07/29/19 2348)     LOS: 2 days   Briant Cedar MD  [facility]  07/30/2019, 3:29 PM

## 2019-07-30 NOTE — Progress Notes (Signed)
Physical Therapy Treatment Patient Details Name: Brian Mclaughlin MRN: [id number] DOB: 03-Jun-1945 Today's Date: 07/30/2019    History of Present Illness Pt is a 74 y/o male admitted secondary to R knee cellulitis. Pt with recent fall with R knee abraision, however, left AMA. PMH includes a fib, alcohol abuse, CHF, HTN, and CVA.     PT Comments    Pt on toilet when PT arrived requesting assistance back to bed. Pt reports no success with BM and that he would feel a better once he had one. PT suggested ambulation to improve bowel motility, pt in agreement. Pt requires min A for power up and steadying with RW. Pt is min guard for ambulation of 60 feet with RW, however R LE with increased pain with ambulation requiring pt to utilize UE on RW to decreased R LE weightbearing. Once back in room pt min guard for getting back to bed for hydrotherapy. D/c plans remain appropriate, however pt continues to refuse PT/OT. Pt did indicate interest in having WOC RN come for wound care. PT will continue to follow acutely.    Follow Up Recommendations  SNF;Supervision/Assistance - 24 hour(may refuse; if he refuses will need max HH services)     Equipment Recommendations  None recommended by PT(has RW at home)       Precautions / Restrictions Precautions Precautions: Fall Restrictions Weight Bearing Restrictions: No    Mobility  Bed Mobility Overal bed mobility: Needs Assistance Bed Mobility: Supine to Sit       Sit to supine: Min guard   General bed mobility comments: Min guard for safety with return of LE to bed  Transfers Overall transfer level: Needs assistance Equipment used: Rolling walker (2 wheeled)(grab bar in bathroom ) Transfers: Sit to/from [employer] Sit to Stand: Min assist         General transfer comment: min A for power up from low toilet seat with heavy use of grab bar and RW  Ambulation/Gait Ambulation/Gait assistance: Min guard Gait Distance (Feet): 60  Feet Assistive device: Rolling walker (2 wheeled) Gait Pattern/deviations: Step-through pattern;Antalgic;Decreased weight shift to right Gait velocity: slowed Gait velocity interpretation: <1.8 ft/sec, indicate of risk for recurrent falls General Gait Details: min guard for safety, initially strong gait, however with progression pt with increased UE support on RW to offweight R LE due to pain      Balance Overall balance assessment: Needs assistance Sitting-balance support: No upper extremity supported;Feet supported Sitting balance-Leahy Scale: Fair     Standing balance support: During functional activity;Single extremity supported Standing balance-Leahy Scale: Poor Standing balance comment: Reliant on external support.                             Cognition Arousal/Alertness: Awake/alert Behavior During Therapy: WFL for tasks assessed/performed Overall Cognitive Status: No family/caregiver present to determine baseline cognitive functioning                                 General Comments: HoH and requires increased multimodal cuing         General Comments General comments (skin integrity, edema, etc.): Knee dressing, clean dry and in place at beginning of session, MD present after ambulation, encouraged pt for HHPT/OT and wound care RN, pt reports he would be open to [facility] RN      Pertinent Vitals/Pain Pain Assessment: Faces Faces Pain Scale:  Hurts little more Pain Location: R knee with weightbearing Pain Descriptors / Indicators: Grimacing;Guarding    Home Living                      Prior Function            PT Goals (current goals can now be found in the care plan section) Acute Rehab PT Goals Patient Stated Goal: "to go to the bathroom"  PT Goal Formulation: With patient Time For Goal Achievement: 08/11/19 Potential to Achieve Goals: Good Progress towards PT goals: Progressing toward goals    Frequency    Min  3X/week      PT Plan Current plan remains appropriate       AM-PAC PT "6 Clicks" Mobility   Outcome Measure  Help needed turning from your back to your side while in a flat bed without using bedrails?: None Help needed moving from lying on your back to sitting on the side of a flat bed without using bedrails?: None Help needed moving to and from a bed to a chair (including a wheelchair)?: None Help needed standing up from a chair using your arms (e.g., wheelchair or bedside chair)?: None Help needed to walk in hospital room?: None Help needed climbing 3-5 steps with a railing? : A Lot 6 Click Score: 22    End of Session   Activity Tolerance: Patient tolerated treatment well Patient left: in bed;with call bell/phone within reach Nurse Communication: Mobility status PT Visit Diagnosis: Unsteadiness on feet (R26.81);Difficulty in walking, not elsewhere classified (R26.2);Other symptoms and signs involving the nervous system (X10.626)     Time: 9485-4627 PT Time Calculation (min) (ACUTE ONLY): 10 min  Charges:  $Gait Training: 8-22 mins                     Zalan Shidler B. Migdalia Dk PT, DPT Acute Rehabilitation Services Pager [phone] Office [phone]    [place] 07/30/2019, 2:18 PM

## 2019-07-30 NOTE — Progress Notes (Signed)
Physical Therapy Wound Treatment Patient Details  Name: Brian Mclaughlin MRN: [id number] Date of Birth: 03-26-1945  Today's Date: 07/30/2019 Time:  -     Subjective  Subjective: pt agreeable to [facility] WOC RN to assist with wound care Patient and Family Stated Goals: go home Prior Treatments: none  Pain Score:  Pt with limited pain during treatment, only once indicating pain with debridement of inferior medial boarder of wound.   Wound Assessment  Wound / Incision (Open or Dehisced) 07/28/19 Other (Comment) Knee Right black scab like area (Active)  Wound Image   07/29/19 1300  Dressing Type ABD;Gauze (Comment);Other (Comment);Moist to dry 07/30/19 1400  Dressing Changed Changed 07/29/19 1300  Dressing Status Clean;Dry;Intact 07/30/19 1400  Dressing Change Frequency Daily 07/30/19 1400  Site / Wound Assessment Dry;Black 07/30/19 1400  % Wound base Red or Granulating 15% 07/30/19 1400  % Wound base Yellow/Fibrinous Exudate 5% 07/30/19 1400  % Wound base Black/Eschar 80% 07/30/19 1400  Peri-wound Assessment Edema;Erythema (blanchable) 07/30/19 1400  Wound Length (cm) 13 cm 07/29/19 1300  Wound Width (cm) 8 cm 07/29/19 1300  Wound Surface Area (cm^2) 104 cm^2 07/29/19 1300  Tunneling (cm) 0 07/28/19 1542  Margins Unattached edges (unapproximated) 07/30/19 1400  Closure None 07/30/19 1400  Drainage Amount Minimal 07/30/19 1400  Drainage Description Serosanguineous 07/30/19 1400  Treatment Debridement (Selective);Hydrotherapy (Pulse lavage);Packing (Saline gauze) 07/30/19 1400  Santyl applied to necrotic tissue.   Hydrotherapy Pulsed lavage therapy - wound location: R knee Pulsed Lavage with Suction (psi): 12 psi Pulsed Lavage with Suction - Normal Saline Used: 1000 mL Pulsed Lavage Tip: Tip with splash shield Selective Debridement Selective Debridement - Location: R knee Selective Debridement - Tools Used: Scalpel;Scissors;Forceps Selective Debridement - Tissue Removed: flaps of yellow  brown, soften eschar loosened by santyl   Wound Assessment and Plan  Wound Therapy - Assess/Plan/Recommendations Wound Therapy - Clinical Statement: Eschar is beginning to break down with crosshatching and Santyl application. Pt educated on benefits of staying in hospital for IV medication and hydrotherapy, however pt indicates desire to return home to PT and MD during session. Pt agreeable to [facility] WOC RN to assist with dressing changes. Pt wound benefit from continued hydrotherapy and debridement to remove eschar and decrease bioburden for improved wound healing.   Wound Therapy - Functional Problem List: decreased weightbearing Factors Delaying/Impairing Wound Healing: Other (comment);Infection - systemic/local;Multiple medical problems;Altered sensation(noncompliance to therapy) Hydrotherapy Plan: Debridement;Dressing change;Pulsatile lavage with suction;Patient/family education Wound Therapy - Frequency: 6X / week Wound Therapy - Follow Up Recommendations: Home health RN Wound Plan: see above  Wound Therapy Goals- Improve the function of patient's integumentary system by progressing the wound(s) through the phases of wound healing (inflammation - proliferation - remodeling) by: Decrease Necrotic Tissue to: 50 Decrease Necrotic Tissue - Progress: Progressing toward goal Increase Granulation Tissue to: 50 Increase Granulation Tissue - Progress: Progressing toward goal Decrease Length/Width/Depth by (cm): 10 Decrease Length/Width/Depth - Progress: Progressing toward goal Goals/treatment plan/discharge plan were made with and agreed upon by patient/family: Yes Time For Goal Achievement: 7 days Wound Therapy - Potential for Goals: Good  Goals will be updated until maximal potential achieved or discharge criteria met.  Discharge criteria: when goals achieved, discharge from hospital, MD decision/surgical intervention, no progress towards goals, refusal/missing three consecutive treatments without  notification or medical reason.  GP    Dani Gobble. Migdalia Dk PT, DPT Acute Rehabilitation Services Pager [phone] Office [phone]  [place] 07/30/2019, 2:30 PM

## 2019-07-31 LAB — CBC WITH DIFFERENTIAL/PLATELET
Abs Immature Granulocytes: 0.02 10*3/uL (ref 0.00–0.07)
Basophils Absolute: 0.1 10*3/uL (ref 0.0–0.1)
Basophils Relative: 1 %
Eosinophils Absolute: 0.3 10*3/uL (ref 0.0–0.5)
Eosinophils Relative: 4 %
HCT: 37.6 % — ABNORMAL LOW (ref 39.0–52.0)
Hemoglobin: 12.6 g/dL — ABNORMAL LOW (ref 13.0–17.0)
Immature Granulocytes: 0 %
Lymphocytes Relative: 24 %
Lymphs Abs: 1.6 10*3/uL (ref 0.7–4.0)
MCH: 34.1 pg — ABNORMAL HIGH (ref 26.0–34.0)
MCHC: 33.5 g/dL (ref 30.0–36.0)
MCV: 101.9 fL — ABNORMAL HIGH (ref 80.0–100.0)
Monocytes Absolute: 0.7 10*3/uL (ref 0.1–1.0)
Monocytes Relative: 10 %
Neutro Abs: 4 10*3/uL (ref 1.7–7.7)
Neutrophils Relative %: 61 %
Platelets: 190 10*3/uL (ref 150–400)
RBC: 3.69 MIL/uL — ABNORMAL LOW (ref 4.22–5.81)
RDW: 12.5 % (ref 11.5–15.5)
WBC: 6.7 10*3/uL (ref 4.0–10.5)
nRBC: 0 % (ref 0.0–0.2)

## 2019-07-31 LAB — BASIC METABOLIC PANEL
Anion gap: 8 (ref 5–15)
BUN: 24 mg/dL — ABNORMAL HIGH (ref 8–23)
CO2: 24 mmol/L (ref 22–32)
Calcium: 8.8 mg/dL — ABNORMAL LOW (ref 8.9–10.3)
Chloride: 104 mmol/L (ref 98–111)
Creatinine, Ser: 1.64 mg/dL — ABNORMAL HIGH (ref 0.61–1.24)
GFR calc Af Amer: 47 mL/min — ABNORMAL LOW (ref >60)
GFR calc non Af Amer: 41 mL/min — ABNORMAL LOW (ref >60)
Glucose, Bld: 92 mg/dL (ref 70–99)
Potassium: 4.1 mmol/L (ref 3.5–5.1)
Sodium: 136 mmol/L (ref 135–145)

## 2019-07-31 MED ORDER — DOXYCYCLINE MONOHYDRATE 100 MG PO TABS: 100.0000 mg | ORAL_TABLET | Freq: Two times a day (BID) | ORAL | 0 refills | Status: AC

## 2019-07-31 NOTE — Plan of Care (Signed)
  Problem: Clinical Measurements: Goal: Will remain free from infection Outcome: Progressing Goal: Respiratory complications will improve Outcome: Progressing   Problem: Nutrition: Goal: Adequate nutrition will be maintained Outcome: Progressing   

## 2019-07-31 NOTE — Progress Notes (Signed)
Patient asked to be discharge. MD aware of request. Currently, there is no order to be d/c. RN explained the risks versus benefits of leaving AMA. Patient states "I want to go home". AMA paper signed, he is discharge AMA.

## 2019-07-31 NOTE — Discharge Summary (Signed)
Discharge Summary  Brian Mclaughlin HFW:[id number] DOB: 08/28/1945  PCP: Patient, No Pcp Per  Admit date: 07/27/2019 Discharge date: 07/31/2019  Time spent: 40 mins  Recommendations for Outpatient Follow-up:  1. None-signed AMA  Discharge Diagnoses:  Active Hospital Problems   Diagnosis Date Noted  . Cellulitis of right lower extremity 07/25/2019  . Nicotine dependence, cigarettes, uncomplicated 07/28/2019  . Mixed hyperlipidemia 07/28/2019  . Hyperkalemia 07/28/2019  . Chronic kidney disease, stage 3b 03/10/2018  . Chronic atrial fibrillation (HCC)   . Chronic systolic CHF (congestive heart failure) (HCC)   . Alcohol abuse 08/23/2011  . HTN (hypertension) 08/23/2011    Resolved Hospital Problems  No resolved problems to display.    Discharge Condition: Fair  Diet recommendation: None  Vitals:   07/31/19 0012 07/31/19 0539  BP: (!) 154/94 114/69  Pulse: 82 77  Resp: 19 19  Temp: 98.4 F (36.9 C) 98.7 F (37.1 C)  SpO2: 92% 96%    History of present illness:  Brian Mclaughlin is an 74 y.o. male with a past medical history significant for EtOH abuse, chronic A. fib not on anticoagulation, chronic systolic heart failure, hyperlipidemia, hypertension, tobacco use, ongoing noncompliance, multiple strokes admitted May 13 with cellulitis of the right knee concerning for septic joint.    Today, patient denies any new complaints, insisting on being discharged.  Discussed with patient the importance of wound care with hydrotherapy, medication compliance, and the risks involved in signing AMA.  Patient verbalized understanding but continues to insist that he needs to leave the hospital due to high hospital bills.  Encourage patient to set up medical care with the [facility], he states his daughter will help him set up medical care at the [facility] in [place].  Patient considering moving back to [place].  Shortly after my discussion, patient signed AMA.  Of note, patient refused home health  services including RN, as he states that it is too expensive.    Hospital Course:  Principal Problem:   Cellulitis of right lower extremity Active Problems:   HTN (hypertension)   Alcohol abuse   Chronic systolic CHF (congestive heart failure) (HCC)   Chronic atrial fibrillation (HCC)   Chronic kidney disease, stage 3b   Nicotine dependence, cigarettes, uncomplicated   Mixed hyperlipidemia   Hyperkalemia   Cellulitis of the right lower extremity/knee wound Currently afebrile, with no leukocytosis BC x2 NGTD CT of the right lower extremity reveals no acute osseous abnormality, large soft tissue hematoma overlying vastus medialis muscle thinning along the proximal lower extremity Doppler negative for any DVT Orthopedics consulted, no further recs, unlikely to be septic arthritis S/p IV antibiotics, printed out a prescription for doxycycline p.o. for a total of 10 days of antibiotics Status post hydrotherapy and dressing changes per [facility]'s recommendation Refused home health services  Chronic kidney disease stage IIIb Difficult to know his baseline, 1 year ago creatinine 2.1  Chronic systolic heart failure Appears compensated Chart review indicates ejection fraction of 40 to 45% from echo done in 2017  Hx of Chronic atrial fibrillation Rate controlled Not on any anticoagulation, patient continues to refuse  History of multiple CVA Refused antiplatelet therapy, that since  EtOH abuse Patient reports 1 beer a day.  No sign symptoms of withdrawal         Malnutrition Type:      Malnutrition Characteristics:      Nutrition Interventions:      Estimated body mass index is 27.12 kg/m as calculated from  the following:   Height as of this encounter: 6' (1.829 m).   Weight as of this encounter: 90.7 kg.    Procedures:  Hydrotherapy with PT  Consultations:  Orthopedics  Discharge Exam: BP 114/69 (BP Location: Right Arm)   Pulse 77   Temp 98.7 F  (37.1 C) (Oral)   Resp 19   Ht 6' (1.829 m)   Wt 90.7 kg   SpO2 96%   BMI 27.12 kg/m   General: NAD Cardiovascular: S1, S2 present Respiratory: CTA B    Allergies  Allergen Reactions  . Codeine Rash  . Penicillins Rash      The results of significant diagnostics from this hospitalization (including imaging, microbiology, ancillary and laboratory) are listed below for reference.    Significant Diagnostic Studies: CT Knee Right Wo Contrast  Result Date: 07/28/2019 CLINICAL DATA:  Knee pain, recent fall EXAM: CT OF THE right  KNEE WITHOUT CONTRAST TECHNIQUE: Multidetector CT imaging of the right knee was performed according to the standard protocol. Multiplanar CT image reconstructions were also generated. COMPARISON:  Jul 25, 2019 FINDINGS: Bones/Joint/Cartilage No fracture or dislocation. There is diffuse osteopenia. 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 Slight lateral subluxation of the patella is noted. No large knee joint effusion is seen. Ligaments Suboptimally assessed by CT. Muscles and Tendons Mild fatty atrophy noted within the muscles. There is edema seen within the vastus medialis musculature. The quadriceps and patellar tendon are intact. Soft tissues Overlying the vastus medialis musculature there is a large heterogeneous soft tissue hematoma measuring approximately 8.4 x 3.4 by 10.6 cm extending along the lower extremity. Overlying subcutaneous edema is noted. Scattered vascular calcifications are seen. IMPRESSION: No acute osseous abnormality. Large soft tissue hematoma overlying the vastus medialis muscle extending along the proximal lower extremity Electronically Signed   By: Jonna ClarkBindu  Avutu M.D.   On: 07/28/2019 01:26   DG Knee Complete 4 Views Right  Result Date: 07/25/2019 CLINICAL DATA:  Fall, knee pain EXAM: RIGHT KNEE - COMPLETE 4+ VIEW COMPARISON:  None. FINDINGS:  Alignment is anatomic. No acute fracture. No joint effusion. Medial joint space narrowing. Vascular calcification. IMPRESSION: No acute fracture.  Medial compartment osteoarthritis. Electronically Signed   By: Guadlupe SpanishPraneil  Patel M.D.   On: 07/25/2019 12:44   VAS US LOWER EXTREMITY VENOUS (DVT)  Result Date: 07/28/2019  Lower Venous DVT Study Indications: Swelling, and Open wound on knee and distal thigh.  Limitations: Open wound on knee and distal thigh. Comparison Study: no prior study on file Performing Technologist: Sherren Kernsandace Kanady RVS  Examination Guidelines: A complete evaluation includes B-mode imaging, spectral Doppler, color Doppler, and power Doppler as needed of all accessible portions of each vessel. Bilateral testing is considered an integral part of a complete examination. Limited examinations for reoccurring indications may be performed as noted. The reflux portion of the exam is performed with the patient in reverse Trendelenburg.  +---------+---------------+---------+-----------+----------+--------------+ RIGHT    CompressibilityPhasicitySpontaneityPropertiesThrombus Aging +---------+---------------+---------+-----------+----------+--------------+ CFV      Full           Yes      Yes                                 +---------+---------------+---------+-----------+----------+--------------+ SFJ      Full                                                        +---------+---------------+---------+-----------+----------+--------------+  FV Prox  Full                                                        +---------+---------------+---------+-----------+----------+--------------+ FV Mid   Full                                                        +---------+---------------+---------+-----------+----------+--------------+ FV DistalFull                                                        +---------+---------------+---------+-----------+----------+--------------+  PFV      Full                                                        +---------+---------------+---------+-----------+----------+--------------+ POP      Full           Yes      Yes                                 +---------+---------------+---------+-----------+----------+--------------+ PTV      Full                                                        +---------+---------------+---------+-----------+----------+--------------+ PERO     Full                                                        +---------+---------------+---------+-----------+----------+--------------+   +----+---------------+---------+-----------+----------+--------------+ LEFTCompressibilityPhasicitySpontaneityPropertiesThrombus Aging +----+---------------+---------+-----------+----------+--------------+ CFV Full           Yes      Yes                                 +----+---------------+---------+-----------+----------+--------------+     Summary: RIGHT: - There is no evidence of deep vein thrombosis in the lower extremity.  - Ultrasound characteristics of enlarged lymph nodes are noted in the groin.  LEFT: - No evidence of common femoral vein obstruction.  *See table(s) above for measurements and observations. Electronically signed by Servando Snare MD on 07/28/2019 at 5:42:57 PM.    Final     Microbiology: Recent Results (from the past 240 hour(s))  Culture, blood (routine x 2)     Status: None (Preliminary result)   Collection Time: 07/27/19 10:38 PM   Specimen: BLOOD  Result Value Ref Range Status   Specimen Description BLOOD RIGHT ANTECUBITAL  Final  Special Requests   Final    BOTTLES DRAWN AEROBIC AND ANAEROBIC Blood Culture adequate volume   Culture   Final    NO GROWTH 4 DAYS Performed at [facility], [address] [place], [place] [address]    Report Status PENDING  Incomplete  SARS Coronavirus 2 by RT PCR (hospital order, performed in [facility] hospital lab)  Nasopharyngeal Nasopharyngeal Swab     Status: None   Collection Time: 07/27/19 10:39 PM   Specimen: Nasopharyngeal Swab  Result Value Ref Range Status   SARS Coronavirus 2 NEGATIVE NEGATIVE Final    Comment: (NOTE) SARS-CoV-2 target nucleic acids are NOT DETECTED. The SARS-CoV-2 RNA is generally detectable in upper and lower respiratory specimens during the acute phase of infection. The lowest concentration of SARS-CoV-2 viral copies this assay can detect is 250 copies / mL. A negative result does not preclude SARS-CoV-2 infection and should not be used as the sole basis for treatment or other patient management decisions.  A negative result may occur with improper specimen collection / handling, submission of specimen other than nasopharyngeal swab, presence of viral mutation(s) within the areas targeted by this assay, and inadequate number of viral copies (<250 copies / mL). A negative result must be combined with clinical observations, patient history, and epidemiological information. Fact Sheet for Patients:   BoilerBrush.com.cy Fact Sheet for Healthcare Providers: https://pope.com/ This test is not yet approved or cleared  by the [place] FDA and has been authorized for detection and/or diagnosis of SARS-CoV-2 by FDA under an Emergency Use Authorization (EUA).  This EUA will remain in effect (meaning this test can be used) for the duration of the COVID-19 declaration under Section 564(b)(1) of the Act, 21 U.S.C. section 360bbb-3(b)(1), unless the authorization is terminated or revoked sooner. Performed at [facility], [address] [place], [place] [address]   MRSA PCR Screening     Status: None   Collection Time: 07/28/19 10:31 AM   Specimen: Nasopharyngeal  Result Value Ref Range Status   MRSA by PCR NEGATIVE NEGATIVE Final    Comment:        The GeneXpert MRSA Assay (FDA approved for NASAL specimens only),  is one component of a comprehensive MRSA colonization surveillance program. It is not intended to diagnose MRSA infection nor to guide or monitor treatment for MRSA infections. Performed at [facility], [address] [place], [place] [address]      Labs: Basic Metabolic Panel: Recent Labs  Lab 07/27/19 1631 07/28/19 0346 07/29/19 0630 07/30/19 0257 07/31/19 0659  NA 139 139 137 135 136  K 4.9 5.6* 4.0 4.2 4.1  CL 107 106 105 103 104  CO2 20* 22 24 25 24   GLUCOSE 94 84 91 93 92  BUN 23 23 21  25* 24*  CREATININE 2.03* 1.78* 1.75* 1.69* 1.64*  CALCIUM 9.2 8.8* 8.4* 8.5* 8.8*  MG  --  2.2  --   --   --    Liver Function Tests: Recent Labs  Lab 07/25/19 1202 07/27/19 1631 07/28/19 0346  AST 15 21 36  ALT 11 9 11   ALKPHOS 61 66 59  BILITOT 1.5* 1.8* 2.2*  PROT 6.9 7.3 6.3*  ALBUMIN 3.4* 3.4* 2.9*   No results for input(s): LIPASE, AMYLASE in the last 168 hours. No results for input(s): AMMONIA in the last 168 hours. CBC: Recent Labs  Lab 07/27/19 1631 07/28/19 0557 07/29/19 0630 07/30/19 0257 07/31/19 0659  WBC 7.0  6.7 7.0 6.7 6.7  NEUTROABS 4.3 3.7 3.8 3.7 4.0  HGB 13.0 12.2* 11.7* 11.9* 12.6*  HCT 42.0 37.8* 35.6* 36.2* 37.6*  MCV 107.7* 105.3* 104.4* 102.5* 101.9*  PLT 198 187 184 184 190   Cardiac Enzymes: No results for input(s): CKTOTAL, CKMB, CKMBINDEX, TROPONINI in the last 168 hours. BNP: BNP (last 3 results) No results for input(s): BNP in the last 8760 hours.  ProBNP (last 3 results) No results for input(s): PROBNP in the last 8760 hours.  CBG: No results for input(s): GLUCAP in the last 168 hours.     Signed:  Briant Cedar, MD [facility] 07/31/2019, 1:02 PM

## 2019-08-01 LAB — CULTURE, BLOOD (ROUTINE X 2)
Culture: NO GROWTH
Special Requests: ADEQUATE

## 2020-01-06 ENCOUNTER — Emergency Department (HOSPITAL_COMMUNITY): Payer: Medicare HMO

## 2020-01-06 ENCOUNTER — Other Ambulatory Visit: Payer: Self-pay

## 2020-01-06 ENCOUNTER — Observation Stay (HOSPITAL_COMMUNITY)
Admission: EM | Admit: 2020-01-06 | Discharge: 2020-01-07 | Disposition: A | Discharge status: Home / Self Care | Payer: Medicare HMO | Attending: Family Medicine | Admitting: Family Medicine

## 2020-01-06 DIAGNOSIS — Z20822 Contact with and (suspected) exposure to covid-19: Secondary | ICD-10-CM | POA: Diagnosis not present

## 2020-01-06 DIAGNOSIS — Z79899 Other long term (current) drug therapy: Secondary | ICD-10-CM | POA: Diagnosis not present

## 2020-01-06 DIAGNOSIS — R531 Weakness: Secondary | ICD-10-CM | POA: Diagnosis not present

## 2020-01-06 DIAGNOSIS — W19XXXA Unspecified fall, initial encounter: Secondary | ICD-10-CM

## 2020-01-06 DIAGNOSIS — N1832 Chronic kidney disease, stage 3b: Secondary | ICD-10-CM | POA: Diagnosis not present

## 2020-01-06 DIAGNOSIS — F1010 Alcohol abuse, uncomplicated: Secondary | ICD-10-CM | POA: Insufficient documentation

## 2020-01-06 DIAGNOSIS — R001 Bradycardia, unspecified: Secondary | ICD-10-CM | POA: Diagnosis not present

## 2020-01-06 DIAGNOSIS — G9340 Encephalopathy, unspecified: Secondary | ICD-10-CM | POA: Diagnosis present

## 2020-01-06 DIAGNOSIS — I10 Essential (primary) hypertension: Secondary | ICD-10-CM | POA: Diagnosis not present

## 2020-01-06 DIAGNOSIS — I5022 Chronic systolic (congestive) heart failure: Secondary | ICD-10-CM | POA: Insufficient documentation

## 2020-01-06 DIAGNOSIS — R69 Illness, unspecified: Secondary | ICD-10-CM | POA: Diagnosis not present

## 2020-01-06 DIAGNOSIS — I482 Chronic atrial fibrillation, unspecified: Secondary | ICD-10-CM | POA: Insufficient documentation

## 2020-01-06 DIAGNOSIS — Z87891 Personal history of nicotine dependence: Secondary | ICD-10-CM | POA: Diagnosis not present

## 2020-01-06 DIAGNOSIS — H748X3 Other specified disorders of middle ear and mastoid, bilateral: Secondary | ICD-10-CM | POA: Diagnosis not present

## 2020-01-06 DIAGNOSIS — I4891 Unspecified atrial fibrillation: Secondary | ICD-10-CM | POA: Diagnosis not present

## 2020-01-06 DIAGNOSIS — I130 Hypertensive heart and chronic kidney disease with heart failure and stage 1 through stage 4 chronic kidney disease, or unspecified chronic kidney disease: Secondary | ICD-10-CM | POA: Diagnosis not present

## 2020-01-06 DIAGNOSIS — I517 Cardiomegaly: Secondary | ICD-10-CM | POA: Diagnosis not present

## 2020-01-06 DIAGNOSIS — R42 Dizziness and giddiness: Secondary | ICD-10-CM | POA: Diagnosis not present

## 2020-01-06 DIAGNOSIS — I6782 Cerebral ischemia: Secondary | ICD-10-CM | POA: Diagnosis not present

## 2020-01-06 DIAGNOSIS — R4182 Altered mental status, unspecified: Secondary | ICD-10-CM | POA: Diagnosis not present

## 2020-01-06 DIAGNOSIS — G319 Degenerative disease of nervous system, unspecified: Secondary | ICD-10-CM | POA: Diagnosis not present

## 2020-01-06 LAB — CBC
HCT: 41.7 % (ref 39.0–52.0)
Hemoglobin: 13.6 g/dL (ref 13.0–17.0)
MCH: 32.5 pg (ref 26.0–34.0)
MCHC: 32.6 g/dL (ref 30.0–36.0)
MCV: 99.5 fL (ref 80.0–100.0)
Platelets: 134 10*3/uL — ABNORMAL LOW (ref 150–400)
RBC: 4.19 MIL/uL — ABNORMAL LOW (ref 4.22–5.81)
RDW: 13.7 % (ref 11.5–15.5)
WBC: 7.2 10*3/uL (ref 4.0–10.5)
nRBC: 0 % (ref 0.0–0.2)

## 2020-01-06 LAB — TSH: TSH: 3.454 u[IU]/mL (ref 0.350–4.500)

## 2020-01-06 LAB — MAGNESIUM: Magnesium: 2.1 mg/dL (ref 1.7–2.4)

## 2020-01-06 LAB — RESPIRATORY PANEL BY RT PCR (FLU A&B, COVID)
Influenza A by PCR: NEGATIVE
Influenza B by PCR: NEGATIVE
SARS Coronavirus 2 by RT PCR: NEGATIVE

## 2020-01-06 LAB — BASIC METABOLIC PANEL
Anion gap: 10 (ref 5–15)
BUN: 17 mg/dL (ref 8–23)
CO2: 24 mmol/L (ref 22–32)
Calcium: 9.3 mg/dL (ref 8.9–10.3)
Chloride: 101 mmol/L (ref 98–111)
Creatinine, Ser: 2.1 mg/dL — ABNORMAL HIGH (ref 0.61–1.24)
GFR, Estimated: 32 mL/min — ABNORMAL LOW (ref >60)
Glucose, Bld: 96 mg/dL (ref 70–99)
Potassium: 4.4 mmol/L (ref 3.5–5.1)
Sodium: 135 mmol/L (ref 135–145)

## 2020-01-06 LAB — HEPATIC FUNCTION PANEL
ALT: 10 U/L (ref 0–44)
AST: 16 U/L (ref 15–41)
Albumin: 3.5 g/dL (ref 3.5–5.0)
Alkaline Phosphatase: 77 U/L (ref 38–126)
Bilirubin, Direct: 0.2 mg/dL (ref 0.0–0.2)
Indirect Bilirubin: 1 mg/dL — ABNORMAL HIGH (ref 0.3–0.9)
Total Bilirubin: 1.2 mg/dL (ref 0.3–1.2)
Total Protein: 7.2 g/dL (ref 6.5–8.1)

## 2020-01-06 LAB — ETHANOL: Alcohol, Ethyl (B): <10 mg/dL (ref <10)

## 2020-01-06 LAB — AMMONIA: Ammonia: 12 umol/L (ref 9–35)

## 2020-01-06 LAB — CBG MONITORING, ED: Glucose-Capillary: 83 mg/dL (ref 70–99)

## 2020-01-06 LAB — CK: Total CK: 94 U/L (ref 49–397)

## 2020-01-06 LAB — TROPONIN I (HIGH SENSITIVITY): Troponin I (High Sensitivity): 19 ng/L — ABNORMAL HIGH (ref <18)

## 2020-01-06 MED ORDER — HYDRALAZINE HCL 20 MG/ML IJ SOLN: 10.0000 mg | Freq: Four times a day (QID) | INTRAMUSCULAR | Status: DC | PRN

## 2020-01-06 MED ORDER — ACETAMINOPHEN 325 MG PO TABS: 650.0000 mg | ORAL_TABLET | Freq: Four times a day (QID) | ORAL | Status: DC | PRN

## 2020-01-06 MED ORDER — HEPARIN SODIUM (PORCINE) 5000 UNIT/ML IJ SOLN
5000.0000 [IU] | Freq: Three times a day (TID) | INTRAMUSCULAR | Status: DC
  Administered 2020-01-06 – 2020-01-07 (×2): 5000 [IU] via SUBCUTANEOUS
  Filled 2020-01-06 (×2): qty 1

## 2020-01-06 MED ORDER — LORAZEPAM 2 MG/ML IJ SOLN
0.5000 mg | Freq: Once | INTRAMUSCULAR | Status: AC
  Administered 2020-01-06: 0.5 mg via INTRAVENOUS
  Filled 2020-01-06: qty 1

## 2020-01-06 MED ORDER — ACETAMINOPHEN 650 MG RE SUPP: 650.0000 mg | Freq: Four times a day (QID) | RECTAL | Status: DC | PRN

## 2020-01-06 MED ORDER — CARVEDILOL 3.125 MG PO TABS
3.1250 mg | ORAL_TABLET | Freq: Two times a day (BID) | ORAL | Status: DC
  Administered 2020-01-06 – 2020-01-07 (×2): 3.125 mg via ORAL
  Filled 2020-01-06 (×2): qty 1

## 2020-01-06 MED ORDER — ONDANSETRON HCL 4 MG PO TABS: 4.0000 mg | ORAL_TABLET | Freq: Four times a day (QID) | ORAL | Status: DC | PRN

## 2020-01-06 MED ORDER — HYDRALAZINE HCL 50 MG PO TABS
50.0000 mg | ORAL_TABLET | Freq: Three times a day (TID) | ORAL | Status: DC
  Administered 2020-01-06 – 2020-01-07 (×3): 50 mg via ORAL
  Filled 2020-01-06: qty 1
  Filled 2020-01-06 (×2): qty 2

## 2020-01-06 MED ORDER — ONDANSETRON HCL 4 MG/2ML IJ SOLN: 4.0000 mg | Freq: Four times a day (QID) | INTRAMUSCULAR | Status: DC | PRN

## 2020-01-06 MED ORDER — SODIUM CHLORIDE 0.9% FLUSH
3.0000 mL | Freq: Two times a day (BID) | INTRAVENOUS | Status: DC
  Administered 2020-01-06: 3 mL via INTRAVENOUS

## 2020-01-06 MED ORDER — LACTATED RINGERS IV BOLUS
500.0000 mL | Freq: Once | INTRAVENOUS | Status: AC
  Administered 2020-01-06: 500 mL via INTRAVENOUS

## 2020-01-06 MED ORDER — SODIUM CHLORIDE 0.9 % IV SOLN: INTRAVENOUS | Status: DC

## 2020-01-06 NOTE — ED Notes (Signed)
Patient transported to CT 

## 2020-01-06 NOTE — ED Notes (Signed)
Update given to pts daughter. 

## 2020-01-06 NOTE — ED Triage Notes (Signed)
Pt bib ems from home with reports of generalized weakness, dizzy while walking onset today. Pt denies pain/injury. Pt with some confusion with EMS. Hypertensive 184/110 with ems. Hx afib with hr 40-60 with ems. Per daughter, pt with failure to thrive for the last few weeks. Hx stroke with difficulty ambulating. Unknown LKW.

## 2020-01-06 NOTE — ED Provider Notes (Signed)
[name] EMERGENCY DEPARTMENT Provider Note   CSN: [id number] Arrival date & time: 01/06/20  1145     History Chief Complaint  Patient presents with  . Weakness  . Altered Mental Status    Brian Mclaughlin is a 74 y.o. male history of CVA, alcohol abuse, A. fib, CHF, hypertension, hyperlipidemia, smoker, rhabdomyolysis.  History obtained by triage note as this patient with altered mental status.  Patient brought in by EMS for generalized weakness, dizziness onset today without pain or injury noted confusion by EMS, hypertensive and in A. fib, questionable failure to thrive over the last few weeks, no last known well. - On my initial evaluation patient is agitated sitting in bed requesting to leave.  He is alert to self and place but not to time or event, he reports some guy dropped him off but he is not sure why.  He denies any pain or injury, recent illness, nausea/vomiting or any concerns at this time he wants to get some food and leave.  HPI     Past Medical History:  Diagnosis Date  . Abnormality of gait 06/14/2014  . Acute CVA (cerebrovascular accident) (HCC) 05/24/2014  . Acute kidney failure, unspecified (HCC)   . Acute, but ill-defined, cerebrovascular disease   . Alcohol abuse   . Altered mental status   . Atrial fibrillation (HCC)   . Chronic systolic CHF (congestive heart failure) (HCC)   . CVA (cerebral infarction)   . ETOH abuse   . Hyperlipidemia   . Hypertension   . Nonspecific elevation of levels of transaminase or lactic acid dehydrogenase (LDH)   . Rhabdomyolysis   . Tobacco abuse   . Tobacco use disorder     Patient Active Problem List   Diagnosis Date Noted  . Nicotine dependence, cigarettes, uncomplicated 07/28/2019  . Mixed hyperlipidemia 07/28/2019  . Hyperkalemia 07/28/2019  . Cellulitis of right lower extremity 07/25/2019  . Right sided weakness 03/10/2018  . Fever 03/10/2018  . Chronic kidney disease, stage 3b (HCC)  03/10/2018  . Abnormality of gait 06/14/2014  . Chronic atrial fibrillation (HCC)   . Stroke, embolic (HCC)   . Noncompliance with medications   . Acute ischemic stroke (HCC) 05/24/2014  . CVA (cerebral infarction) 05/24/2014  . Chronic systolic CHF (congestive heart failure) (HCC)   . Stroke (HCC) 08/23/2011  . HTN (hypertension) 08/23/2011  . Alcohol abuse 08/23/2011  . LFTs abnormal 08/23/2011  . Acute renal failure (HCC) 08/23/2011  . Rhabdomyolysis 08/23/2011    Past Surgical History:  Procedure Laterality Date  . BACK SURGERY  10 years ago   "slipped disc" repair  . EYE MUSCLE SURGERY     as a teenager "tighten his muscles"       Family History  Problem Relation Age of Onset  . Cancer Mother   . Cancer Father   . Multiple sclerosis Daughter     Social History   Tobacco Use  . Smoking status: Former Smoker    Packs/day: 0.50    Years: 45.00    Pack years: 22.50    Types: Cigarettes  . Smokeless tobacco: Never Used  Substance Use Topics  . Alcohol use: Yes    Comment: Drinks 6-8 beers daily  . Drug use: No    Comment: denies any drug usage    Home Medications Prior to Admission medications   Medication Sig Start Date End Date Taking? Authorizing Provider  atorvastatin (LIPITOR) 20 MG tablet Take 1 tablet (20 mg  total) by mouth at bedtime. Patient not taking: Reported on 07/27/2019 03/12/18   Rai, Delene Ruffini, MD  carvedilol (COREG) 3.125 MG tablet Take 1 tablet (3.125 mg total) by mouth 2 (two) times daily with a meal. Patient not taking: Reported on 07/27/2019 03/12/18   Rai, Delene Ruffini, MD  hydrALAZINE (APRESOLINE) 50 MG tablet Take 1 tablet (50 mg total) by mouth 3 (three) times daily. Patient not taking: Reported on 07/27/2019 03/12/18   Cathren Harsh, MD  loperamide (IMODIUM) 2 MG capsule Take 1 capsule (2 mg total) by mouth as needed for diarrhea or loose stools. Patient not taking: Reported on 07/27/2019 03/12/18   Cathren Harsh, MD  thiamine 100  MG tablet Take 1 tablet (100 mg total) by mouth daily. Patient not taking: Reported on 07/27/2019 03/12/18   Cathren Harsh, MD    Allergies    Codeine and Penicillins  Review of Systems   Review of Systems  Unable to perform ROS: Mental status change    Physical Exam Updated Vital Signs BP (!) 149/76   Pulse (!) 51   Temp 98.4 F (36.9 C) (Oral)   Resp 18   Ht 6' (1.829 m)   Wt 93 kg   SpO2 98%   BMI 27.80 kg/m   Physical Exam Constitutional:      General: He is not in acute distress.    Appearance: Normal appearance. He is well-developed. He is not ill-appearing or diaphoretic.  HENT:     Head: Normocephalic and atraumatic.  Eyes:     General: Vision grossly intact. Gaze aligned appropriately.     Pupils: Pupils are equal, round, and reactive to light.  Neck:     Trachea: Trachea and phonation normal.  Pulmonary:     Effort: Pulmonary effort is normal. No respiratory distress.  Abdominal:     General: There is no distension.     Palpations: Abdomen is soft.     Tenderness: There is no abdominal tenderness. There is no guarding or rebound.  Musculoskeletal:        General: Normal range of motion.     Cervical back: Normal range of motion.  Skin:    General: Skin is warm and dry.  Neurological:     Mental Status: He is alert.     GCS: GCS eye subscore is 4. GCS verbal subscore is 5. GCS motor subscore is 6.     Comments: Speech is clear and goal oriented, follows commands Major Cranial nerves without deficit, no facial droop Moves extremities without ataxia, coordination intact  Psychiatric:        Behavior: Behavior normal.     ED Results / Procedures / Treatments   Labs (all labs ordered are listed, but only abnormal results are displayed) Labs Reviewed  BASIC METABOLIC PANEL - Abnormal; Notable for the following components:      Result Value   Creatinine, Ser 2.10 (*)    GFR, Estimated 32 (*)    All other components within normal limits  CBC -  Abnormal; Notable for the following components:   RBC 4.19 (*)    Platelets 134 (*)    All other components within normal limits  URINALYSIS, ROUTINE W REFLEX MICROSCOPIC  CBG MONITORING, ED    EKG None  Radiology No results found.  Procedures Procedures (including critical care time)  Medications Ordered in ED Medications - No data to display  ED Course  I have reviewed the triage vital signs and the  nursing notes.  Pertinent labs & imaging results that were available during my care of the patient were reviewed by me and considered in my medical decision making (see chart for details).  Clinical Course as of Jan 06 1520  Fri Jan 06, 2020  1416 Lavonna Monarch (Friend)  [phone] [facility])   [BM]  1434 No answer   [BM]  [phone] [facility]     [BM]    Clinical Course User Index [BM] Elizabeth Palau   MDM Rules/Calculators/A&P                         Additional history obtained from: 1. Nursing notes from this visit. 2. Review of electronic medical records. --------------------- 74 year old male presented by EMS with some confusion dizziness and generalized weakness.  He is alert and oriented x2.  He denies any injuries or concerns.  Will obtain AMS work-up and include troponin and CT head for evaluation of altered mental status in elderly male. --- Ammonia within normal limits Ethanol within normal limits CBG within normal limits BMP shows mild elevation of creatinine at 2.1 up from 1.6 five months ago CBC without leukocytosis or anemia  CT head:    IMPRESSION:  No evidence of acute intracranial abnormality.    Unchanged chronic supratentorial and infratentorial infarcts as  described.    Stable background moderate generalized cerebral atrophy and advanced  chronic small vessel ischemic disease.    Mild paranasal sinus mucosal thickening.    Bilateral mastoid effusions.   CXR:  IMPRESSION:  1. No acute cardiopulmonary  disease.  2. Similar cardiomegaly.  ---------------------  Care handoff given to [place] Robinson, PA-C at shift change, disposition per oncoming team.    Note: Portions of this report may have been transcribed using voice recognition software. Every effort was made to ensure accuracy; however, inadvertent computerized transcription errors may still be present. Final Clinical Impression(s) / ED Diagnoses Final diagnoses:  None    Rx / DC Orders ED Discharge Orders    None       Elizabeth Palau 01/06/20 1522    Benjiman Core, MD 01/06/20 [phone]

## 2020-01-06 NOTE — ED Notes (Signed)
Pt attempting to walk outside, barefoot. Pt states he is leaving. Pt not able to answer orientation questions appropriately. Gait unsteady. Pt assisted back inside to chair.

## 2020-01-06 NOTE — H&P (Signed)
History and Physical    Brian Mclaughlin XBJ:[id number] DOB: 01/20/1946 DOA: 01/06/2020  PCP: Patient, No Pcp Per  Patient coming from: Home  I have personally briefly reviewed patient's old medical records in [facility]  Chief Complaint: Fall  HPI: Brian Mclaughlin is a 74 y.o. male with medical history significant of hypertension, permanent atrial fibrillation, not on anticoagulation, chronic systolic congestive heart failure, CKD stage IIIb, EtOH abuse, CVA was brought into the emergency department via EMS due to fall.  According to the history obtained from the patient, supplemented by the history provided by ED physician, patient had a fall sometime either today or yesterday and after that patient called his girlfriend (which she called his wife) and she called EMS and thus patient was brought into the emergency department.  When he arrived to the emergency department, patient was completely encephalopathic.  He was trying to get out of the hospital, walking around and trying to get home while he remained confused throughout.  Patient was not able to provide any history due to confusion.  According to ED physician, they discussed this with patient's daughter who lives in [place] and according to the daughter, patient drinks alcohol very frequently and lately has been having waxing and waning confusion.  He is very stable and gentleman at baseline according to the daughter.  Daughter believes that patient is not capable of living by himself and should go to SNF but patient would not seek medical attention.  Apparently, he was hospitalized for cellulitis not too long ago at our hospital and eventually signed out AMA.  Upon my evaluation today, patient is alert and oriented x3 but he has.  Self confusion in between.  He knew the current month, current year and that he was at [facility].  He told me the whole story that he fell.  He told me that he did not hit his head or lose consciousness.  He  denied having any pain.  He denied having direct hits to any part of the body.  He was also able to tell me that his daughter lives in [place].  Looks like patient has improved compared to what he was earlier.  When discussed with him about admission, patient was adamant that he would go home and does not want to come to the medical floor.  He told me that once he goes to the medical floor, it is permanent.  I had to reassure him that it is not permanent and that we would like to give him some IV fluids and have him evaluated by PT OT before we possibly discharge him either tomorrow or in next couple of days.  He was then convinced to stay.  ED Course: Upon arrival to ED, patient had some bradycardia and slightly elevated blood pressure.  CT head as well as chest x-ray negative.  CBC normal.  BMP showed slightly elevated creatinine than his baseline.  Ammonia normal.  CK normal.  Ethanol normal.  Troponin slightly elevated.  UDS pending.  UA pending.  COVID-19 pending.  Review of Systems: As per HPI otherwise negative.    Past Medical History:  Diagnosis Date  . Abnormality of gait 06/14/2014  . Acute CVA (cerebrovascular accident) (HCC) 05/24/2014  . Acute kidney failure, unspecified (HCC)   . Acute, but ill-defined, cerebrovascular disease   . Alcohol abuse   . Altered mental status   . Atrial fibrillation (HCC)   . Chronic systolic CHF (congestive heart failure) (HCC)   .  CVA (cerebral infarction)   . ETOH abuse   . Hyperlipidemia   . Hypertension   . Nonspecific elevation of levels of transaminase or lactic acid dehydrogenase (LDH)   . Rhabdomyolysis   . Tobacco abuse   . Tobacco use disorder     Past Surgical History:  Procedure Laterality Date  . BACK SURGERY  10 years ago   "slipped disc" repair  . EYE MUSCLE SURGERY     as a teenager "tighten his muscles"     reports that he has quit smoking. His smoking use included cigarettes. He has a 22.50 pack-year smoking history.  He has never used smokeless tobacco. He reports current alcohol use. He reports that he does not use drugs.  Allergies  Allergen Reactions  . Codeine Rash  . Penicillins Rash    Family History  Problem Relation Age of Onset  . Cancer Mother   . Cancer Father   . Multiple sclerosis Daughter     Prior to Admission medications   Medication Sig Start Date End Date Taking? Authorizing Provider  atorvastatin (LIPITOR) 20 MG tablet Take 1 tablet (20 mg total) by mouth at bedtime. Patient not taking: Reported on 07/27/2019 03/12/18   Rai, Delene Ruffini, MD  carvedilol (COREG) 3.125 MG tablet Take 1 tablet (3.125 mg total) by mouth 2 (two) times daily with a meal. Patient not taking: Reported on 07/27/2019 03/12/18   Rai, Delene Ruffini, MD  hydrALAZINE (APRESOLINE) 50 MG tablet Take 1 tablet (50 mg total) by mouth 3 (three) times daily. Patient not taking: Reported on 07/27/2019 03/12/18   Cathren Harsh, MD  loperamide (IMODIUM) 2 MG capsule Take 1 capsule (2 mg total) by mouth as needed for diarrhea or loose stools. Patient not taking: Reported on 07/27/2019 03/12/18   Cathren Harsh, MD  thiamine 100 MG tablet Take 1 tablet (100 mg total) by mouth daily. Patient not taking: Reported on 07/27/2019 03/12/18   Cathren Harsh, MD    Physical Exam: Vitals:   01/06/20 1438 01/06/20 1445 01/06/20 1500 01/06/20 1530  BP: (!) 135/94 138/88 (!) 152/88 (!) 169/87  Pulse: (!) 49 (!) 54 (!) 58 (!) 55  Resp: 16 13 (!) 21 11  Temp:      TempSrc:      SpO2: 99% 100% 97% 100%  Weight:      Height:        Constitutional: NAD, calm, comfortable Vitals:   01/06/20 1438 01/06/20 1445 01/06/20 1500 01/06/20 1530  BP: (!) 135/94 138/88 (!) 152/88 (!) 169/87  Pulse: (!) 49 (!) 54 (!) 58 (!) 55  Resp: 16 13 (!) 21 11  Temp:      TempSrc:      SpO2: 99% 100% 97% 100%  Weight:      Height:       Eyes: PERRL, lids and conjunctivae normal ENMT: Mucous membranes are moist. Posterior pharynx clear of any  exudate or lesions.Normal dentition.  Neck: normal, supple, no masses, no thyromegaly Respiratory: clear to auscultation bilaterally, no wheezing, no crackles. Normal respiratory effort. No accessory muscle use.  Cardiovascular: Regular rate and rhythm, no murmurs / rubs / gallops. No extremity edema. 2+ pedal pulses. No carotid bruits.  Abdomen: no tenderness, no masses palpated. No hepatosplenomegaly. Bowel sounds positive.  Musculoskeletal: no clubbing / cyanosis. No joint deformity upper and lower extremities. Good ROM, no contractures. Normal muscle tone.  Skin: no rashes, lesions, ulcers. No induration Neurologic: CN 2-12 grossly intact. Sensation intact,  DTR normal. Strength 5/5 in all 4.  Psychiatric: Poor judgment and insight. Alert and oriented x 3. Normal mood.    Labs on Admission: I have personally reviewed following labs and imaging studies  CBC: Recent Labs  Lab 01/06/20 1209  WBC 7.2  HGB 13.6  HCT 41.7  MCV 99.5  PLT 134*   Basic Metabolic Panel: Recent Labs  Lab 01/06/20 1209  NA 135  K 4.4  CL 101  CO2 24  GLUCOSE 96  BUN 17  CREATININE 2.10*  CALCIUM 9.3   GFR: Estimated Creatinine Clearance: 33.9 mL/min (A) (by C-G formula based on SCr of 2.1 mg/dL (H)). Liver Function Tests: Recent Labs  Lab 01/06/20 1405  AST 16  ALT 10  ALKPHOS 77  BILITOT 1.2  PROT 7.2  ALBUMIN 3.5   No results for input(s): LIPASE, AMYLASE in the last 168 hours. Recent Labs  Lab 01/06/20 1405  AMMONIA 12   Coagulation Profile: No results for input(s): INR, PROTIME in the last 168 hours. Cardiac Enzymes: Recent Labs  Lab 01/06/20 1405  CKTOTAL 94   BNP (last 3 results) No results for input(s): PROBNP in the last 8760 hours. HbA1C: No results for input(s): HGBA1C in the last 72 hours. CBG: Recent Labs  Lab 01/06/20 1404  GLUCAP 83   Lipid Profile: No results for input(s): CHOL, HDL, LDLCALC, TRIG, CHOLHDL, LDLDIRECT in the last 72 hours. Thyroid  Function Tests: No results for input(s): TSH, T4TOTAL, FREET4, T3FREE, THYROIDAB in the last 72 hours. Anemia Panel: No results for input(s): VITAMINB12, FOLATE, FERRITIN, TIBC, IRON, RETICCTPCT in the last 72 hours. Urine analysis:    Component Value Date/Time   COLORURINE YELLOW 07/28/2019 1031   APPEARANCEUR CLEAR 07/28/2019 1031   LABSPEC 1.026 07/28/2019 1031   PHURINE 5.0 07/28/2019 1031   GLUCOSEU NEGATIVE 07/28/2019 1031   HGBUR SMALL (A) 07/28/2019 1031   BILIRUBINUR NEGATIVE 07/28/2019 1031   KETONESUR NEGATIVE 07/28/2019 1031   PROTEINUR 30 (A) 07/28/2019 1031   UROBILINOGEN 0.2 05/24/2014 0548   NITRITE NEGATIVE 07/28/2019 1031   LEUKOCYTESUR NEGATIVE 07/28/2019 1031    Radiological Exams on Admission: CT HEAD WO CONTRAST  Result Date: 01/06/2020 CLINICAL DATA:  Mental status change, unknown cause. EXAM: CT HEAD WITHOUT CONTRAST TECHNIQUE: Contiguous axial images were obtained from the base of the skull through the vertex without intravenous contrast. COMPARISON:  MRI/MRA head 03/10/2018. FINDINGS: Brain: Moderate generalized cerebral atrophy. Chronic cortically based infarcts within the paramedian and posterior left frontal lobe, left parietal lobe and left occipital lobe. Chronic lacunar infarct within the right corona radiata/basal ganglia. Redemonstrated chronic lacunar infarcts within the right pons and left cerebellum. Advanced ill-defined hypoattenuation within the cerebral white matter is nonspecific, but compatible with chronic small vessel ischemic disease. There is no acute intracranial hemorrhage. No acute demarcated cortical infarct is identified. No extra-axial fluid collection. No evidence of intracranial mass. No midline shift. Vascular: No hyperdense vessel.  Atherosclerotic calcifications. Skull: Normal. Negative for fracture or focal lesion. Sinuses/Orbits: Visualized orbits show no acute finding. Mild scattered paranasal sinus mucosal thickening. Bilateral  mastoid effusions. IMPRESSION: No evidence of acute intracranial abnormality. Unchanged chronic supratentorial and infratentorial infarcts as described. Stable background moderate generalized cerebral atrophy and advanced chronic small vessel ischemic disease. Mild paranasal sinus mucosal thickening. Bilateral mastoid effusions. Electronically Signed   By: Jackey LogeKyle  Golden DO   On: 01/06/2020 14:36   DG Chest Port 1 View  Result Date: 01/06/2020 CLINICAL DATA:  Altered mental status. EXAM: PORTABLE  CHEST 1 VIEW COMPARISON:  03/10/2018 FINDINGS: Enlarged cardiac silhouette, similar to prior. Low lung volumes without focal consolidation. No visible pleural effusions or pneumothorax. No acute osseous abnormality. Polyarticular degenerative change. IMPRESSION: 1. No acute cardiopulmonary disease. 2. Similar cardiomegaly. Electronically Signed   By: Feliberto Harts MD   On: 01/06/2020 14:12    EKG: Independently reviewed.  Atrial fibrillation  Assessment/Plan Active Problems:   HTN (hypertension)   Alcohol abuse   Chronic systolic CHF (congestive heart failure) (HCC)   Chronic atrial fibrillation (HCC)   Generalized weakness   Chronic kidney disease, stage 3b (HCC)   Acute encephalopathy   Fall   Fall/generalized weakness: Patient denies hurting himself.  CT head negative.  No indication to pursue any other imaging studies.  We will admit him and have him assessed by PT OT tomorrow morning.  Daughter wants him to go to SNF as she does not think that he is capable of living by himself.  I agree with her.  I have consulted TOC as well.  AKI on CKD stage IIIb: Likely due to dehydration.  Will start on gentle hydration due to history of systolic congestive heart failure and recheck labs in the morning.  Chronic systolic congestive heart failure: Looks dehydrated.  He does not seem to be taking any medications for this.  Acute encephalopathy: Source remains unclear.  Likely acute on chronic  encephalopathy/possible delirium on top of undiagnosed dementia precipitated by chronic alcoholism.  CT head negative.  Permanent atrial fibrillation: Has bradycardia but is still atrial fibrillation.  He is not on any anticoagulant for reasons unknown to me.  He is not a good candidate for anticoagulant anyways due to his falls.  Essential hypertension: Blood pressure elevated.  His home medications include hydralazine and carvedilol but unsure whether he was taking them or not.  Daughter cannot confirm that either.  Will resume both of them and place as needed hydralazine.  Chronic thrombocytopenia: Likely secondary to chronic alcoholism.  No signs of bleeding.  Monitor.  DVT prophylaxis: heparin injection 5,000 Units Start: 01/06/20 2200 Code Status: Full code, discussed with daughter Family Communication: None present at bedside.  Plan of care discussed with daughter over the phone. Disposition Plan: Will likely require SNF. Consults called: None Admission status: Observation   Status is: Observation  The patient remains OBS appropriate and will d/c before 2 midnights.  Dispo: The patient is from: Home              Anticipated d/c is to: SNF              Anticipated d/c date is: 1 day              Patient currently is not medically stable to d/c.       Hughie Closs MD [facility]  01/06/2020, 5:49 PM  To contact the attending provider between 7A-7P or the covering provider during after hours 7P-7A, please log into the web site [email or web address]

## 2020-01-06 NOTE — ED Provider Notes (Signed)
Care assumed at shift change from [place], [place], pending U/A and admission. See their note for full HPI and workup. Briefly, pt presenting with AMS, Unclear mental baseline, unable to reach caregiver. Brought in for generalized weakness/dizziness. Confused with EMS though unclear mental baseline. Hx of alcoholism and CVA.  Workup is negative, pending U/A. Agitated requiring a dose of ativan. Pt ambulating with unsteady gait and not oriented.  Placed call to patient's daughter, Darean Rote, is patient's POA.  She states patient does get confused at times and has had multiple falls.  She states however the frequency of confusion has been increasing and she is unsure of the cause.  He lives at home alone -she is clear that the friend Zada Finders does not live with the patient and is not a regular caregiver.  He states patient is supposed be ambulating with a walker however does not do so, sometimes uses a broom and stumbles around.  She thinks he is still drinking alcohol frequently.  She states he desperately needs a case management consult.  She states he is also not been seeing a PCP and is quite stubborn with medical care.  She does not feel as though he is safe at home alone. Physical Exam  BP (!) 152/88   Pulse (!) 58   Temp 98.4 F (36.9 C) (Oral)   Resp (!) 21   Ht 6' (1.829 m)   Wt 93 kg   SpO2 97%   BMI 27.80 kg/m   Physical Exam Vitals and nursing note reviewed.  Constitutional:      General: He is not in acute distress.    Appearance: He is well-developed.  HENT:     Head: Normocephalic and atraumatic.  Eyes:     Conjunctiva/sclera: Conjunctivae normal.  Cardiovascular:     Rate and Rhythm: Normal rate.  Pulmonary:     Effort: Pulmonary effort is normal.  Abdominal:     Palpations: Abdomen is soft.  Skin:    General: Skin is warm.     Comments: Chronic skin changes to bilateral lower extremities  Neurological:     Mental Status: He is alert.  Psychiatric:         Behavior: Behavior normal.     ED Course/Procedures   Clinical Course as of Jan 06 6  Fri Jan 06, 2020  1416 Lavonna Monarch (Friend)  [phone] [facility])   [BM]  1434 No answer   [BM]  [phone] Hinderman,[name]     [BM]  1637 RN reporting that Pt is becoming agitated once more, unable to ambulate with steady gait. Pt is confused. Redosed ativan. Patient also reported to urinate on himself, unable to collect U/A at this time. Will consult for admission for AMS, unclear of chronicity, will need SW and CM consults.   [JR]  1649 Patient is resting in bed at this time.  He is oriented to person and place as well as month though is unsure of the day dates a year is 2020.  He states he fell today when getting out of bed the first time he has fallen.  He states he occasionally drinks alcohol his last use was on "Wednesday or Thursday" then asked what day today was and then he corrected his answer to "Monday or Tuesday" stating he had about "a  half bottle"   [JR]    Clinical Course User Index [BM] Bill Salinas, PA-C [JR] Marquavious Nazar, [place] N, PA-C    Procedures  MDM  Unfortunately unable to  attain urinalysis as patient is incontinent of urine on himself.  Due to delayed time in the ED pending inevitable admission, will consult medicine for admission and further management, pending UA collection. patient will benefit from social work and case management consultations during admission.      Azariah Latendresse, [place] N, PA-C 01/07/20 0013    Benjiman Core, MD 01/10/20 [phone]

## 2020-01-07 ENCOUNTER — Encounter (HOSPITAL_COMMUNITY): Payer: Self-pay | Admitting: Family Medicine

## 2020-01-07 DIAGNOSIS — F1010 Alcohol abuse, uncomplicated: Secondary | ICD-10-CM | POA: Diagnosis not present

## 2020-01-07 DIAGNOSIS — G9340 Encephalopathy, unspecified: Secondary | ICD-10-CM | POA: Diagnosis not present

## 2020-01-07 DIAGNOSIS — R69 Illness, unspecified: Secondary | ICD-10-CM | POA: Diagnosis not present

## 2020-01-07 LAB — COMPREHENSIVE METABOLIC PANEL
ALT: 9 U/L (ref 0–44)
AST: 16 U/L (ref 15–41)
Albumin: 3 g/dL — ABNORMAL LOW (ref 3.5–5.0)
Alkaline Phosphatase: 59 U/L (ref 38–126)
Anion gap: 10 (ref 5–15)
BUN: 19 mg/dL (ref 8–23)
CO2: 22 mmol/L (ref 22–32)
Calcium: 8.7 mg/dL — ABNORMAL LOW (ref 8.9–10.3)
Chloride: 104 mmol/L (ref 98–111)
Creatinine, Ser: 1.98 mg/dL — ABNORMAL HIGH (ref 0.61–1.24)
GFR, Estimated: 35 mL/min — ABNORMAL LOW (ref >60)
Glucose, Bld: 84 mg/dL (ref 70–99)
Potassium: 4.4 mmol/L (ref 3.5–5.1)
Sodium: 136 mmol/L (ref 135–145)
Total Bilirubin: 1.2 mg/dL (ref 0.3–1.2)
Total Protein: 6.3 g/dL — ABNORMAL LOW (ref 6.5–8.1)

## 2020-01-07 LAB — CBC
HCT: 37.3 % — ABNORMAL LOW (ref 39.0–52.0)
Hemoglobin: 12.2 g/dL — ABNORMAL LOW (ref 13.0–17.0)
MCH: 32.1 pg (ref 26.0–34.0)
MCHC: 32.7 g/dL (ref 30.0–36.0)
MCV: 98.2 fL (ref 80.0–100.0)
Platelets: 123 10*3/uL — ABNORMAL LOW (ref 150–400)
RBC: 3.8 MIL/uL — ABNORMAL LOW (ref 4.22–5.81)
RDW: 13.9 % (ref 11.5–15.5)
WBC: 7 10*3/uL (ref 4.0–10.5)
nRBC: 0 % (ref 0.0–0.2)

## 2020-01-07 LAB — URINALYSIS, COMPLETE (UACMP) WITH MICROSCOPIC
Bacteria, UA: NONE SEEN
Bilirubin Urine: NEGATIVE
Glucose, UA: NEGATIVE mg/dL
Hgb urine dipstick: NEGATIVE
Ketones, ur: NEGATIVE mg/dL
Leukocytes,Ua: NEGATIVE
Nitrite: NEGATIVE
Protein, ur: NEGATIVE mg/dL
Specific Gravity, Urine: 1.014 (ref 1.005–1.030)
pH: 6 (ref 5.0–8.0)

## 2020-01-07 LAB — RAPID URINE DRUG SCREEN, HOSP PERFORMED
Amphetamines: NOT DETECTED
Barbiturates: NOT DETECTED
Benzodiazepines: NOT DETECTED
Cocaine: NOT DETECTED
Opiates: NOT DETECTED
Tetrahydrocannabinol: NOT DETECTED

## 2020-01-07 NOTE — Progress Notes (Signed)
Pt refuses to wear telemetry, pulse ox and allow IV fluids to run. MD at bedside.

## 2020-01-07 NOTE — Progress Notes (Signed)
Pt refused assessment, would not talk to daughter on the phone, MD aware pt is leaving AMA. Pt's pants are soiled, given blue paper scrub pants to wear. IV removed intact.

## 2020-01-07 NOTE — Care Management (Signed)
Spoke to patient's daughter as requested by MD.  She states she is very concerned that patient is unsafe at home and should not live alone.  He has refused sitters and other services partially because of cost. We are unable to offer anything new as the patient left AMA.   He is enrolled in [facility].  Discussed options associated with that and they are free services . Message sent to [facility] to please f/u with patient.      Daughter appreciative of phone call and states she understands that as long as he is A&O, we cannot force care.

## 2020-01-07 NOTE — Discharge Summary (Signed)
Physician Discharge Summary  Brian Mclaughlin TDV:[id number] DOB: May 26, 1945 DOA: 01/06/2020  PCP: Patient, No Pcp Per  Admit date: 01/06/2020 Discharge date: 01/07/2020  Admitted From: Home Disposition: Left AGAINST MEDICAL ADVICE  Recommendations for Outpatient Follow-up:  1. Follow up with PCP in 1-2 weeks 2. Please obtain BMP/CBC in one week 3. Please follow up with your PCP on the following pending results: Unresulted Labs (From admission, onward)          Start     Ordered   01/06/20 1159  Urinalysis, Routine w reflex microscopic Urine, Clean Catch  Once,   STAT        01/06/20 1158           Home Health: None Equipment/Devices: None  Discharge Condition: Poor CODE STATUS: Full code Diet recommendation: Cardiac  Subjective: I was paged by primary RN that patient wants to leave. I attended to the patient within 5 minutes. Patient was sitting at the edge of the bed. Patient was completely alert and oriented. He was able to answer any question and every question that I asked. He did seem to have full capacity to make decisions for himself.  HPI: Brian Mclaughlin is a 74 y.o. male with medical history significant of hypertension, permanent atrial fibrillation, not on anticoagulation, chronic systolic congestive heart failure, CKD stage IIIb, EtOH abuse, CVA was brought into the emergency department via EMS due to fall.  According to the history obtained from the patient, supplemented by the history provided by ED physician, patient had a fall sometime either today or yesterday and after that patient called his girlfriend (which she called his wife) and she called EMS and thus patient was brought into the emergency department.  When he arrived to the emergency department, patient was completely encephalopathic.  He was trying to get out of the hospital, walking around and trying to get home while he remained confused throughout.  Patient was not able to provide any history due to confusion.   According to ED physician, they discussed this with patient's daughter who lives in [place] and according to the daughter, patient drinks alcohol very frequently and lately has been having waxing and waning confusion.  He is very stable and gentleman at baseline according to the daughter.  Daughter believes that patient is not capable of living by himself and should go to SNF but patient would not seek medical attention.  Apparently, he was hospitalized for cellulitis not too long ago at our hospital and eventually signed out AMA.  Upon my evaluation today, patient is alert and oriented x3 but he has.  Self confusion in between.  He knew the current month, current year and that he was at [facility].  He told me the whole story that he fell.  He told me that he did not hit his head or lose consciousness.  He denied having any pain.  He denied having direct hits to any part of the body.  He was also able to tell me that his daughter lives in [place].  Looks like patient has improved compared to what he was earlier.  When discussed with him about admission, patient was adamant that he would go home and does not want to come to the medical floor.  He told me that once he goes to the medical floor, it is permanent.  I had to reassure him that it is not permanent and that we would like to give him some IV fluids and have him  evaluated by PT OT before we possibly discharge him either tomorrow or in next couple of days.  He was then convinced to stay.  ED Course: Upon arrival to ED, patient had some bradycardia and slightly elevated blood pressure.  CT head as well as chest x-ray negative.  CBC normal.  BMP showed slightly elevated creatinine than his baseline.  Ammonia normal.  CK normal.  Ethanol normal.  Troponin slightly elevated.  UDS pending.  UA pending.  COVID-19 pending.   Brief/Interim Summary: Patient was admitted due to fall, weakness and AKI on CKD stage IIIb/dehydration. He was started on IV  fluids. Patient's creatinine improved a little bit. Reportedly, per ED staff, patient was completely confused when he initially presented to ED but by the time I saw him at the time of admission on 01/06/2020, patient was alert and oriented x3. He denied any complaint. He did not want to be admitted however after long discussion, I was able to convince him to come to the hospital for evaluation and IV fluids. This morning, I was called by primary RN that patient wants to leave and is trying to get his IV out although patient was completely alert and oriented. I attended to the patient within 5 minutes. Patient was sitting at the edge of the bed. He was completely alert and oriented. He was able to answer any and every question I asked. I did reiterated to him that he is not ready and safe for discharge yet as he still needs more fluids, evaluation with PT OT and perhaps needs some rehabilitation. Patient verbalized understanding and despite of that he was very clear that he wanted to go home. Since he was not clear from medical perspective, he was offered AMA which he signed and left. I did call his daughter Brian Mclaughlin and informed her about the situation and the fact that patient has full capacity to make decisions for himself and that he wants to leave AMA and unfortunately, we do not have any right to keep him in the hospital against his wishes. She was frustrated due to the fact that his father keeps doing this and that he would not listen to her but she was also grateful for the call and have referred to try to convince her father. I also left a message to case manager to call the daughter to see if they can offer any help to her. Patient left AMA.  Discharge Diagnoses:  Active Problems:   HTN (hypertension)   Alcohol abuse   Chronic systolic CHF (congestive heart failure) (HCC)   Chronic atrial fibrillation (HCC)   Generalized weakness   Chronic kidney disease, stage 3b (HCC)   Acute encephalopathy    Fall    Discharge Instructions   Allergies as of 01/07/2020      Reactions   Codeine Rash   Penicillins Rash      Medication List    TAKE these medications   atorvastatin 20 MG tablet Commonly known as: LIPITOR Take 1 tablet (20 mg total) by mouth at bedtime.   carvedilol 3.125 MG tablet Commonly known as: COREG Take 1 tablet (3.125 mg total) by mouth 2 (two) times daily with a meal.   hydrALAZINE 50 MG tablet Commonly known as: APRESOLINE Take 1 tablet (50 mg total) by mouth 3 (three) times daily.   loperamide 2 MG capsule Commonly known as: IMODIUM Take 1 capsule (2 mg total) by mouth as needed for diarrhea or loose stools.   thiamine 100 MG  tablet Take 1 tablet (100 mg total) by mouth daily.       Allergies  Allergen Reactions  . Codeine Rash  . Penicillins Rash    Consultations: None   Procedures/Studies: CT HEAD WO CONTRAST  Result Date: 01/06/2020 CLINICAL DATA:  Mental status change, unknown cause. EXAM: CT HEAD WITHOUT CONTRAST TECHNIQUE: Contiguous axial images were obtained from the base of the skull through the vertex without intravenous contrast. COMPARISON:  MRI/MRA head 03/10/2018. FINDINGS: Brain: Moderate generalized cerebral atrophy. Chronic cortically based infarcts within the paramedian and posterior left frontal lobe, left parietal lobe and left occipital lobe. Chronic lacunar infarct within the right corona radiata/basal ganglia. Redemonstrated chronic lacunar infarcts within the right pons and left cerebellum. Advanced ill-defined hypoattenuation within the cerebral white matter is nonspecific, but compatible with chronic small vessel ischemic disease. There is no acute intracranial hemorrhage. No acute demarcated cortical infarct is identified. No extra-axial fluid collection. No evidence of intracranial mass. No midline shift. Vascular: No hyperdense vessel.  Atherosclerotic calcifications. Skull: Normal. Negative for fracture or focal  lesion. Sinuses/Orbits: Visualized orbits show no acute finding. Mild scattered paranasal sinus mucosal thickening. Bilateral mastoid effusions. IMPRESSION: No evidence of acute intracranial abnormality. Unchanged chronic supratentorial and infratentorial infarcts as described. Stable background moderate generalized cerebral atrophy and advanced chronic small vessel ischemic disease. Mild paranasal sinus mucosal thickening. Bilateral mastoid effusions. Electronically Signed   By: Jackey LogeKyle  Golden DO   On: 01/06/2020 14:36   DG Chest Port 1 View  Result Date: 01/06/2020 CLINICAL DATA:  Altered mental status. EXAM: PORTABLE CHEST 1 VIEW COMPARISON:  03/10/2018 FINDINGS: Enlarged cardiac silhouette, similar to prior. Low lung volumes without focal consolidation. No visible pleural effusions or pneumothorax. No acute osseous abnormality. Polyarticular degenerative change. IMPRESSION: 1. No acute cardiopulmonary disease. 2. Similar cardiomegaly. Electronically Signed   By: Feliberto HartsFrederick S Jones MD   On: 01/06/2020 14:12     Discharge Exam: Vitals:   01/07/20 0220 01/07/20 0239  BP:  134/86  Pulse:  (!) 56  Resp:  16  Temp: 98.5 F (36.9 C) 98 F (36.7 C)  SpO2:  96%   Vitals:   01/07/20 0100 01/07/20 0200 01/07/20 0220 01/07/20 0239  BP: 117/87 101/65  134/86  Pulse: (!) 56 60  (!) 56  Resp:  16  16  Temp:   98.5 F (36.9 C) 98 F (36.7 C)  TempSrc:   Oral Oral  SpO2: 91% 98%  96%  Weight:      Height:        General: Pt is alert, awake, not in acute distress Cardiovascular: RRR, S1/S2 +, no rubs, no gallops Respiratory: CTA bilaterally, no wheezing, no rhonchi Abdominal: Soft, NT, ND, bowel sounds + Extremities: no edema, no cyanosis    The results of significant diagnostics from this hospitalization (including imaging, microbiology, ancillary and laboratory) are listed below for reference.     Microbiology: Recent Results (from the past 240 hour(s))  Respiratory Panel by RT PCR  (Flu A&B, Covid) - Nasopharyngeal Swab     Status: None   Collection Time: 01/06/20  6:20 PM   Specimen: Nasopharyngeal Swab  Result Value Ref Range Status   SARS Coronavirus 2 by RT PCR NEGATIVE NEGATIVE Final    Comment: (NOTE) SARS-CoV-2 target nucleic acids are NOT DETECTED.  The SARS-CoV-2 RNA is generally detectable in upper respiratoy specimens during the acute phase of infection. The lowest concentration of SARS-CoV-2 viral copies this assay can detect is 131 copies/mL. A negative  result does not preclude SARS-Cov-2 infection and should not be used as the sole basis for treatment or other patient management decisions. A negative result may occur with  improper specimen collection/handling, submission of specimen other than nasopharyngeal swab, presence of viral mutation(s) within the areas targeted by this assay, and inadequate number of viral copies (<131 copies/mL). A negative result must be combined with clinical observations, patient history, and epidemiological information. The expected result is Negative.  Fact Sheet for Patients:  https://www.moore.com/  Fact Sheet for Healthcare Providers:  https://www.young.biz/  This test is no t yet approved or cleared by the [place] FDA and  has been authorized for detection and/or diagnosis of SARS-CoV-2 by FDA under an Emergency Use Authorization (EUA). This EUA will remain  in effect (meaning this test can be used) for the duration of the COVID-19 declaration under Section 564(b)(1) of the Act, 21 U.S.C. section 360bbb-3(b)(1), unless the authorization is terminated or revoked sooner.     Influenza A by PCR NEGATIVE NEGATIVE Final   Influenza B by PCR NEGATIVE NEGATIVE Final    Comment: (NOTE) The Xpert Xpress SARS-CoV-2/FLU/RSV assay is intended as an aid in  the diagnosis of influenza from Nasopharyngeal swab specimens and  should not be used as a sole basis for treatment.  Nasal washings and  aspirates are unacceptable for Xpert Xpress SARS-CoV-2/FLU/RSV  testing.  Fact Sheet for Patients: https://www.moore.com/  Fact Sheet for Healthcare Providers: https://www.young.biz/  This test is not yet approved or cleared by the [place] FDA and  has been authorized for detection and/or diagnosis of SARS-CoV-2 by  FDA under an Emergency Use Authorization (EUA). This EUA will remain  in effect (meaning this test can be used) for the duration of the  Covid-19 declaration under Section 564(b)(1) of the Act, 21  U.S.C. section 360bbb-3(b)(1), unless the authorization is  terminated or revoked. Performed at [facility], [address], [place], [place] [address]      Labs: BNP (last 3 results) No results for input(s): BNP in the last 8760 hours. Basic Metabolic Panel: Recent Labs  Lab 01/06/20 1209 01/06/20 1856 01/07/20 0311  NA 135  --  136  K 4.4  --  4.4  CL 101  --  104  CO2 24  --  22  GLUCOSE 96  --  84  BUN 17  --  19  CREATININE 2.10*  --  1.98*  CALCIUM 9.3  --  8.7*  MG  --  2.1  --    Liver Function Tests: Recent Labs  Lab 01/06/20 1405 01/07/20 0311  AST 16 16  ALT 10 9  ALKPHOS 77 59  BILITOT 1.2 1.2  PROT 7.2 6.3*  ALBUMIN 3.5 3.0*   No results for input(s): LIPASE, AMYLASE in the last 168 hours. Recent Labs  Lab 01/06/20 1405  AMMONIA 12   CBC: Recent Labs  Lab 01/06/20 1209 01/07/20 0311  WBC 7.2 7.0  HGB 13.6 12.2*  HCT 41.7 37.3*  MCV 99.5 98.2  PLT 134* 123*   Cardiac Enzymes: Recent Labs  Lab 01/06/20 1405  CKTOTAL 94   BNP: Invalid input(s): POCBNP CBG: Recent Labs  Lab 01/06/20 1404  GLUCAP 83   D-Dimer No results for input(s): DDIMER in the last 72 hours. Hgb A1c No results for input(s): HGBA1C in the last 72 hours. Lipid Profile No results for input(s): CHOL, HDL, LDLCALC, TRIG, CHOLHDL, LDLDIRECT in the last 72 hours. Thyroid function  studies Recent Labs  01/06/20 1856  TSH 3.454   Anemia work up No results for input(s): VITAMINB12, FOLATE, FERRITIN, TIBC, IRON, RETICCTPCT in the last 72 hours. Urinalysis    Component Value Date/Time   COLORURINE YELLOW 01/07/2020 0206   APPEARANCEUR CLEAR 01/07/2020 0206   LABSPEC 1.014 01/07/2020 0206   PHURINE 6.0 01/07/2020 0206   GLUCOSEU NEGATIVE 01/07/2020 0206   HGBUR NEGATIVE 01/07/2020 0206   BILIRUBINUR NEGATIVE 01/07/2020 0206   KETONESUR NEGATIVE 01/07/2020 0206   PROTEINUR NEGATIVE 01/07/2020 0206   UROBILINOGEN 0.2 05/24/2014 0548   NITRITE NEGATIVE 01/07/2020 0206   LEUKOCYTESUR NEGATIVE 01/07/2020 0206   Sepsis Labs Invalid input(s): PROCALCITONIN,  WBC,  LACTICIDVEN Microbiology Recent Results (from the past 240 hour(s))  Respiratory Panel by RT PCR (Flu A&B, Covid) - Nasopharyngeal Swab     Status: None   Collection Time: 01/06/20  6:20 PM   Specimen: Nasopharyngeal Swab  Result Value Ref Range Status   SARS Coronavirus 2 by RT PCR NEGATIVE NEGATIVE Final    Comment: (NOTE) SARS-CoV-2 target nucleic acids are NOT DETECTED.  The SARS-CoV-2 RNA is generally detectable in upper respiratoy specimens during the acute phase of infection. The lowest concentration of SARS-CoV-2 viral copies this assay can detect is 131 copies/mL. A negative result does not preclude SARS-Cov-2 infection and should not be used as the sole basis for treatment or other patient management decisions. A negative result may occur with  improper specimen collection/handling, submission of specimen other than nasopharyngeal swab, presence of viral mutation(s) within the areas targeted by this assay, and inadequate number of viral copies (<131 copies/mL). A negative result must be combined with clinical observations, patient history, and epidemiological information. The expected result is Negative.  Fact Sheet for Patients:  https://www.moore.com/  Fact  Sheet for Healthcare Providers:  https://www.young.biz/  This test is no t yet approved or cleared by the [place] FDA and  has been authorized for detection and/or diagnosis of SARS-CoV-2 by FDA under an Emergency Use Authorization (EUA). This EUA will remain  in effect (meaning this test can be used) for the duration of the COVID-19 declaration under Section 564(b)(1) of the Act, 21 U.S.C. section 360bbb-3(b)(1), unless the authorization is terminated or revoked sooner.     Influenza A by PCR NEGATIVE NEGATIVE Final   Influenza B by PCR NEGATIVE NEGATIVE Final    Comment: (NOTE) The Xpert Xpress SARS-CoV-2/FLU/RSV assay is intended as an aid in  the diagnosis of influenza from Nasopharyngeal swab specimens and  should not be used as a sole basis for treatment. Nasal washings and  aspirates are unacceptable for Xpert Xpress SARS-CoV-2/FLU/RSV  testing.  Fact Sheet for Patients: https://www.moore.com/  Fact Sheet for Healthcare Providers: https://www.young.biz/  This test is not yet approved or cleared by the [place] FDA and  has been authorized for detection and/or diagnosis of SARS-CoV-2 by  FDA under an Emergency Use Authorization (EUA). This EUA will remain  in effect (meaning this test can be used) for the duration of the  Covid-19 declaration under Section 564(b)(1) of the Act, 21  U.S.C. section 360bbb-3(b)(1), unless the authorization is  terminated or revoked. Performed at [facility], [address] [place], [place] [address]      Time coordinating discharge: Over 30 minutes  SIGNED:   Hughie Closs, MD  [facility] 01/07/2020, 10:51 AM  If 7PM-7AM, please contact night-coverage [email or web address]

## 2020-01-10 ENCOUNTER — Other Ambulatory Visit: Payer: Self-pay | Admitting: *Deleted

## 2020-01-10 NOTE — Patient Outreach (Signed)
[facility] Customer service manager [facility]) Care Management  01/10/2020  Brian Mclaughlin Feb 02, 1946 [id number]   Telephone Screening-Unsuccessful  RN initial outreach attempt unsuccessful. RN was able to leave a HIPAA approved voice message requesting a call back.  Will send outreach letter and schedule another outreach call over the next week.  Elliot Cousin, RN Care Management Coordinator [facility] Main Office [phone]

## 2020-01-17 ENCOUNTER — Other Ambulatory Visit: Payer: Self-pay | Admitting: *Deleted

## 2020-01-17 NOTE — Patient Outreach (Signed)
[facility] Customer service manager [facility]) Care Management  01/17/2020  Brian Mclaughlin [birth year] [id number]   Telephone Assessment-Unsuccessful  Outreach #2 RN attempted outreach call today however unsuccessful. RN able to leave a HIPAA approved voice message requesting a call back.  Will rescheduled another outreach call over the next week.  Elliot Cousin, RN Care Management Coordinator [facility] Main Office [phone]

## 2020-01-20 ENCOUNTER — Other Ambulatory Visit: Payer: Self-pay | Admitting: *Deleted

## 2020-01-20 NOTE — Patient Outreach (Signed)
[facility] Customer service manager [facility]) Care Management  01/20/2020  Brian Mclaughlin April 18, 1945 [id number]   Telephone Assessment/Screen  3rd unsuccessful outreach for pending service with [facility]. RN able to leave a HIPAA approved message requesting a call back.  Will scheduled another outreach per workflow next month.  Elliot Cousin, RN Care Management Coordinator [facility] Main Office [phone]

## 2020-02-17 ENCOUNTER — Other Ambulatory Visit: Payer: Self-pay | Admitting: *Deleted

## 2020-02-17 NOTE — Patient Outreach (Signed)
[facility]) Care Management  02/17/2020  MAYNARD DAVID 1945/10/04 [id number]   Telephone Assessment-Case closure   Outreach #4 RN unable to reach pt today however able to leave a HIPAA approved voice message requesting a call back.   Based upon no response to calls or outreach letter will close this case and alert the provider of pt's disposition with [facility].  Elliot Cousin, RN Care Management Coordinator [facility] Main Office [phone]

## 2020-03-15 ENCOUNTER — Encounter (HOSPITAL_COMMUNITY): Payer: Self-pay

## 2020-03-15 DIAGNOSIS — I10 Essential (primary) hypertension: Secondary | ICD-10-CM | POA: Diagnosis not present

## 2020-03-15 DIAGNOSIS — Z743 Need for continuous supervision: Secondary | ICD-10-CM | POA: Diagnosis not present

## 2020-03-15 DIAGNOSIS — R059 Cough, unspecified: Secondary | ICD-10-CM | POA: Diagnosis present

## 2020-03-15 DIAGNOSIS — J690 Pneumonitis due to inhalation of food and vomit: Secondary | ICD-10-CM | POA: Diagnosis not present

## 2020-03-15 DIAGNOSIS — L89151 Pressure ulcer of sacral region, stage 1: Secondary | ICD-10-CM | POA: Diagnosis present

## 2020-03-15 DIAGNOSIS — D6959 Other secondary thrombocytopenia: Secondary | ICD-10-CM | POA: Diagnosis present

## 2020-03-15 DIAGNOSIS — W1830XA Fall on same level, unspecified, initial encounter: Secondary | ICD-10-CM | POA: Diagnosis not present

## 2020-03-15 DIAGNOSIS — E860 Dehydration: Secondary | ICD-10-CM | POA: Diagnosis not present

## 2020-03-15 DIAGNOSIS — R29810 Facial weakness: Secondary | ICD-10-CM | POA: Diagnosis present

## 2020-03-15 DIAGNOSIS — U071 COVID-19: Secondary | ICD-10-CM | POA: Diagnosis not present

## 2020-03-15 DIAGNOSIS — G8191 Hemiplegia, unspecified affecting right dominant side: Secondary | ICD-10-CM | POA: Diagnosis present

## 2020-03-15 DIAGNOSIS — S32010A Wedge compression fracture of first lumbar vertebra, initial encounter for closed fracture: Secondary | ICD-10-CM | POA: Diagnosis present

## 2020-03-15 DIAGNOSIS — Z880 Allergy status to penicillin: Secondary | ICD-10-CM

## 2020-03-15 DIAGNOSIS — I130 Hypertensive heart and chronic kidney disease with heart failure and stage 1 through stage 4 chronic kidney disease, or unspecified chronic kidney disease: Secondary | ICD-10-CM | POA: Diagnosis not present

## 2020-03-15 DIAGNOSIS — R5381 Other malaise: Secondary | ICD-10-CM | POA: Diagnosis present

## 2020-03-15 DIAGNOSIS — N1832 Chronic kidney disease, stage 3b: Secondary | ICD-10-CM | POA: Diagnosis not present

## 2020-03-15 DIAGNOSIS — E782 Mixed hyperlipidemia: Secondary | ICD-10-CM | POA: Diagnosis not present

## 2020-03-15 DIAGNOSIS — R627 Adult failure to thrive: Secondary | ICD-10-CM | POA: Diagnosis present

## 2020-03-15 DIAGNOSIS — R471 Dysarthria and anarthria: Secondary | ICD-10-CM | POA: Diagnosis not present

## 2020-03-15 DIAGNOSIS — R0902 Hypoxemia: Secondary | ICD-10-CM | POA: Diagnosis not present

## 2020-03-15 DIAGNOSIS — I480 Paroxysmal atrial fibrillation: Secondary | ICD-10-CM | POA: Diagnosis not present

## 2020-03-15 DIAGNOSIS — I517 Cardiomegaly: Secondary | ICD-10-CM | POA: Diagnosis not present

## 2020-03-15 DIAGNOSIS — I739 Peripheral vascular disease, unspecified: Secondary | ICD-10-CM | POA: Diagnosis not present

## 2020-03-15 DIAGNOSIS — R4701 Aphasia: Secondary | ICD-10-CM | POA: Diagnosis present

## 2020-03-15 DIAGNOSIS — G9349 Other encephalopathy: Secondary | ICD-10-CM | POA: Diagnosis present

## 2020-03-15 DIAGNOSIS — I63512 Cerebral infarction due to unspecified occlusion or stenosis of left middle cerebral artery: Secondary | ICD-10-CM | POA: Diagnosis not present

## 2020-03-15 DIAGNOSIS — I482 Chronic atrial fibrillation, unspecified: Secondary | ICD-10-CM | POA: Diagnosis not present

## 2020-03-15 DIAGNOSIS — I2699 Other pulmonary embolism without acute cor pulmonale: Secondary | ICD-10-CM | POA: Diagnosis not present

## 2020-03-15 DIAGNOSIS — S32000A Wedge compression fracture of unspecified lumbar vertebra, initial encounter for closed fracture: Secondary | ICD-10-CM | POA: Diagnosis not present

## 2020-03-15 DIAGNOSIS — Y92009 Unspecified place in unspecified non-institutional (private) residence as the place of occurrence of the external cause: Secondary | ICD-10-CM

## 2020-03-15 DIAGNOSIS — R6889 Other general symptoms and signs: Secondary | ICD-10-CM | POA: Diagnosis not present

## 2020-03-15 DIAGNOSIS — W19XXXA Unspecified fall, initial encounter: Secondary | ICD-10-CM | POA: Diagnosis not present

## 2020-03-15 DIAGNOSIS — M545 Low back pain, unspecified: Secondary | ICD-10-CM | POA: Diagnosis not present

## 2020-03-15 DIAGNOSIS — I4891 Unspecified atrial fibrillation: Secondary | ICD-10-CM | POA: Diagnosis not present

## 2020-03-15 DIAGNOSIS — I5022 Chronic systolic (congestive) heart failure: Secondary | ICD-10-CM | POA: Diagnosis present

## 2020-03-15 DIAGNOSIS — Z515 Encounter for palliative care: Secondary | ICD-10-CM | POA: Diagnosis not present

## 2020-03-15 DIAGNOSIS — Z87891 Personal history of nicotine dependence: Secondary | ICD-10-CM

## 2020-03-15 DIAGNOSIS — I959 Hypotension, unspecified: Secondary | ICD-10-CM | POA: Diagnosis present

## 2020-03-15 DIAGNOSIS — I499 Cardiac arrhythmia, unspecified: Secondary | ICD-10-CM | POA: Diagnosis not present

## 2020-03-15 DIAGNOSIS — R29722 NIHSS score 22: Secondary | ICD-10-CM | POA: Diagnosis present

## 2020-03-15 DIAGNOSIS — F419 Anxiety disorder, unspecified: Secondary | ICD-10-CM | POA: Diagnosis present

## 2020-03-15 DIAGNOSIS — I639 Cerebral infarction, unspecified: Secondary | ICD-10-CM | POA: Diagnosis not present

## 2020-03-15 DIAGNOSIS — F1010 Alcohol abuse, uncomplicated: Secondary | ICD-10-CM | POA: Diagnosis present

## 2020-03-15 DIAGNOSIS — H53461 Homonymous bilateral field defects, right side: Secondary | ICD-10-CM | POA: Diagnosis not present

## 2020-03-15 DIAGNOSIS — Z66 Do not resuscitate: Secondary | ICD-10-CM | POA: Diagnosis not present

## 2020-03-15 DIAGNOSIS — R296 Repeated falls: Secondary | ICD-10-CM | POA: Diagnosis not present

## 2020-03-15 DIAGNOSIS — Z7189 Other specified counseling: Secondary | ICD-10-CM | POA: Diagnosis not present

## 2020-03-15 DIAGNOSIS — G9340 Encephalopathy, unspecified: Secondary | ICD-10-CM | POA: Diagnosis not present

## 2020-03-15 DIAGNOSIS — Z043 Encounter for examination and observation following other accident: Secondary | ICD-10-CM | POA: Diagnosis not present

## 2020-03-15 DIAGNOSIS — J9811 Atelectasis: Secondary | ICD-10-CM | POA: Diagnosis not present

## 2020-03-15 LAB — BASIC METABOLIC PANEL
Anion gap: 14 (ref 5–15)
BUN: 39 mg/dL — ABNORMAL HIGH (ref 8–23)
CO2: 24 mmol/L (ref 22–32)
Calcium: 9.7 mg/dL (ref 8.9–10.3)
Chloride: 100 mmol/L (ref 98–111)
Creatinine, Ser: 2.14 mg/dL — ABNORMAL HIGH (ref 0.61–1.24)
GFR, Estimated: 31 mL/min — ABNORMAL LOW (ref >60)
Glucose, Bld: 99 mg/dL (ref 70–99)
Potassium: 4.2 mmol/L (ref 3.5–5.1)
Sodium: 138 mmol/L (ref 135–145)

## 2020-03-15 LAB — CBC
HCT: 50 % (ref 39.0–52.0)
Hemoglobin: 16.5 g/dL (ref 13.0–17.0)
MCH: 32.5 pg (ref 26.0–34.0)
MCHC: 33 g/dL (ref 30.0–36.0)
MCV: 98.4 fL (ref 80.0–100.0)
Platelets: 118 10*3/uL — ABNORMAL LOW (ref 150–400)
RBC: 5.08 MIL/uL (ref 4.22–5.81)
RDW: 13.7 % (ref 11.5–15.5)
WBC: 9.1 10*3/uL (ref 4.0–10.5)
nRBC: 0 % (ref 0.0–0.2)

## 2020-03-15 NOTE — ED Notes (Signed)
Treasure Ingrum, daughter, [phone], please call with update.

## 2020-03-15 NOTE — ED Triage Notes (Addendum)
Pt presents with c/o fatigue/weakness for approx 3 weeks. Pt's family has nursing home placement set up for him in [place]. Pt also has a decreased appetite. Pt is alert and oriented per EMS. Pt has an 18g left AC placed by EMS, not given any meds by EMS.

## 2020-03-16 ENCOUNTER — Emergency Department (HOSPITAL_COMMUNITY): Payer: Medicare HMO

## 2020-03-16 ENCOUNTER — Other Ambulatory Visit: Payer: Self-pay

## 2020-03-16 ENCOUNTER — Inpatient Hospital Stay (HOSPITAL_COMMUNITY)
Admission: EM | Admit: 2020-03-16 | Discharge: 2020-03-28 | DRG: 177 | Disposition: A | Discharge status: Skilled Nursing Facility | Payer: Medicare HMO | Attending: Family Medicine | Admitting: Family Medicine

## 2020-03-16 DIAGNOSIS — I482 Chronic atrial fibrillation, unspecified: Secondary | ICD-10-CM | POA: Diagnosis present

## 2020-03-16 DIAGNOSIS — R296 Repeated falls: Secondary | ICD-10-CM

## 2020-03-16 DIAGNOSIS — I63512 Cerebral infarction due to unspecified occlusion or stenosis of left middle cerebral artery: Secondary | ICD-10-CM | POA: Diagnosis present

## 2020-03-16 DIAGNOSIS — M545 Low back pain, unspecified: Secondary | ICD-10-CM | POA: Diagnosis not present

## 2020-03-16 DIAGNOSIS — I4891 Unspecified atrial fibrillation: Secondary | ICD-10-CM | POA: Diagnosis not present

## 2020-03-16 DIAGNOSIS — F1010 Alcohol abuse, uncomplicated: Secondary | ICD-10-CM | POA: Diagnosis present

## 2020-03-16 DIAGNOSIS — G9340 Encephalopathy, unspecified: Secondary | ICD-10-CM | POA: Diagnosis present

## 2020-03-16 DIAGNOSIS — W19XXXA Unspecified fall, initial encounter: Secondary | ICD-10-CM | POA: Diagnosis present

## 2020-03-16 DIAGNOSIS — I5022 Chronic systolic (congestive) heart failure: Secondary | ICD-10-CM | POA: Diagnosis present

## 2020-03-16 DIAGNOSIS — Z043 Encounter for examination and observation following other accident: Secondary | ICD-10-CM | POA: Diagnosis not present

## 2020-03-16 DIAGNOSIS — R059 Cough, unspecified: Secondary | ICD-10-CM

## 2020-03-16 DIAGNOSIS — I10 Essential (primary) hypertension: Secondary | ICD-10-CM | POA: Diagnosis present

## 2020-03-16 DIAGNOSIS — L8990 Pressure ulcer of unspecified site, unspecified stage: Secondary | ICD-10-CM | POA: Insufficient documentation

## 2020-03-16 DIAGNOSIS — S32000A Wedge compression fracture of unspecified lumbar vertebra, initial encounter for closed fracture: Secondary | ICD-10-CM

## 2020-03-16 DIAGNOSIS — N1832 Chronic kidney disease, stage 3b: Secondary | ICD-10-CM | POA: Diagnosis present

## 2020-03-16 DIAGNOSIS — U071 COVID-19: Secondary | ICD-10-CM

## 2020-03-16 DIAGNOSIS — I739 Peripheral vascular disease, unspecified: Secondary | ICD-10-CM

## 2020-03-16 DIAGNOSIS — R627 Adult failure to thrive: Secondary | ICD-10-CM | POA: Diagnosis present

## 2020-03-16 DIAGNOSIS — E782 Mixed hyperlipidemia: Secondary | ICD-10-CM | POA: Diagnosis present

## 2020-03-16 LAB — HEPATIC FUNCTION PANEL
ALT: 10 U/L (ref 0–44)
AST: 19 U/L (ref 15–41)
Albumin: 4 g/dL (ref 3.5–5.0)
Alkaline Phosphatase: 63 U/L (ref 38–126)
Bilirubin, Direct: 0.8 mg/dL — ABNORMAL HIGH (ref 0.0–0.2)
Indirect Bilirubin: 1 mg/dL — ABNORMAL HIGH (ref 0.3–0.9)
Total Bilirubin: 1.8 mg/dL — ABNORMAL HIGH (ref 0.3–1.2)
Total Protein: 8.3 g/dL — ABNORMAL HIGH (ref 6.5–8.1)

## 2020-03-16 LAB — URINALYSIS, ROUTINE W REFLEX MICROSCOPIC
Bilirubin Urine: NEGATIVE
Glucose, UA: NEGATIVE mg/dL
Hgb urine dipstick: NEGATIVE
Ketones, ur: NEGATIVE mg/dL
Nitrite: NEGATIVE
Protein, ur: 100 mg/dL — AB
Specific Gravity, Urine: 1.03 (ref 1.005–1.030)
pH: 5 (ref 5.0–8.0)

## 2020-03-16 LAB — CK: Total CK: 371 U/L (ref 49–397)

## 2020-03-16 LAB — SARS CORONAVIRUS 2 (TAT 6-24 HRS): SARS Coronavirus 2: NEGATIVE

## 2020-03-16 MED ORDER — ATORVASTATIN CALCIUM 10 MG PO TABS
20.0000 mg | ORAL_TABLET | Freq: Every day | ORAL | Status: DC
  Administered 2020-03-16 – 2020-03-19 (×4): 20 mg via ORAL
  Filled 2020-03-16 (×4): qty 2

## 2020-03-16 MED ORDER — LORAZEPAM 1 MG PO TABS
1.0000 mg | ORAL_TABLET | ORAL | Status: DC | PRN
  Administered 2020-03-16 – 2020-03-19 (×4): 1 mg via ORAL
  Filled 2020-03-16 (×4): qty 1

## 2020-03-16 MED ORDER — CARVEDILOL 3.125 MG PO TABS
3.1250 mg | ORAL_TABLET | Freq: Two times a day (BID) | ORAL | Status: DC
  Administered 2020-03-16 – 2020-03-20 (×9): 3.125 mg via ORAL
  Filled 2020-03-16 (×9): qty 1

## 2020-03-16 MED ORDER — THIAMINE HCL 100 MG PO TABS
100.0000 mg | ORAL_TABLET | Freq: Every day | ORAL | Status: DC
  Administered 2020-03-16 – 2020-03-20 (×5): 100 mg via ORAL
  Filled 2020-03-16 (×5): qty 1

## 2020-03-16 MED ORDER — HYDRALAZINE HCL 25 MG PO TABS
50.0000 mg | ORAL_TABLET | Freq: Three times a day (TID) | ORAL | Status: DC
  Administered 2020-03-16 – 2020-03-20 (×12): 50 mg via ORAL
  Filled 2020-03-16 (×14): qty 2

## 2020-03-16 NOTE — ED Notes (Signed)
Patient stating they "want to get out of here." After reminding the patient that he is waiting for SNF placement he says he "still wants to get out of here and doesn't care about his daughter" who recommends that he does not go back to living home alone.

## 2020-03-16 NOTE — Progress Notes (Signed)
EPD signed pt's FL-2.  An insurance Berkley Harvey has not been started but will need to be started Monday when [facility] reopens and the [employer] authorization can be initiated as they are closed over the weekend.  PT consult has been ordered by the EPD.  Pt's COVID test was negative as of  03/16/20.  CSW will continue to follow for D/C needs.  Dorothe Pea. Alvita Fana  MSW, LCSW, LCAS, CCS Transitions of Care Clinical Social Worker Care Coordination Department Ph: [phone]

## 2020-03-16 NOTE — ED Provider Notes (Signed)
TIME SEEN: 12:54 AM  CHIEF COMPLAINT: fall, left hip pain  HPI: Patient is a 74 year old male with history of CVA, chronic kidney disease, atrial fibrillation, CHF, hypertension, hyperlipidemia, alcohol abuse who presents to the emergency department with EMS after he fell.  States he lives at home alone.  His family is in [place].  States he got dizzy and fell onto his left hip.  Complains of left hip pain and lower back pain.  Does not think he hit his head.  States he is not on any blood thinners.  No loss of consciousness.  Denies chest pain, shortness of breath, fevers, cough, vomiting, diarrhea.  States he has not had anything to drink in 3 days.  No history of withdrawal seizures or DTs.  States he does not drink every day.  He does have bilateral peripheral edema in his lower extremities and discoloration of both feet which he states has been like that for 2 to 3 months.  He denies any numbness in his legs and no pain.  He has not seen a vascular surgeon and states he does not have a primary care doctor.  States he ambulates at home with a walker.   Spoke with Davier Tramell, daughter, [phone].  She states that she is concerned that patient is progressively declining and is not safe to live at home by himself.  She states she is making arrangements for him to be in a retirement facility in [place] but has not worked out all of the logistics yet.  She does not think that he should go home.  She states that he has had multiple falls recently and EMS had to be called tonight to help get him off the floor.   ROS: See HPI Constitutional: no fever  Eyes: no drainage  ENT: no runny nose   Cardiovascular:  no chest pain  Resp: no SOB  GI: no vomiting GU: no dysuria Integumentary: no rash  Allergy: no hives  Musculoskeletal: no leg swelling  Neurological: no slurred speech ROS otherwise negative  PAST MEDICAL HISTORY/PAST SURGICAL HISTORY:  Past Medical History:  Diagnosis Date   . Abnormality of gait 06/14/2014  . Acute CVA (cerebrovascular accident) (HCC) 05/24/2014  . Acute kidney failure, unspecified (HCC)   . Acute, but ill-defined, cerebrovascular disease   . Alcohol abuse   . Altered mental status   . Atrial fibrillation (HCC)   . Chronic systolic CHF (congestive heart failure) (HCC)   . CVA (cerebral infarction)   . ETOH abuse   . Hyperlipidemia   . Hypertension   . Nonspecific elevation of levels of transaminase or lactic acid dehydrogenase (LDH)   . Rhabdomyolysis   . Tobacco abuse   . Tobacco use disorder     MEDICATIONS:  Prior to Admission medications   Medication Sig Start Date End Date Taking? Authorizing Provider  atorvastatin (LIPITOR) 20 MG tablet Take 1 tablet (20 mg total) by mouth at bedtime. Patient not taking: Reported on 01/06/2020 03/12/18   Cathren Harsh, MD  carvedilol (COREG) 3.125 MG tablet Take 1 tablet (3.125 mg total) by mouth 2 (two) times daily with a meal. Patient not taking: Reported on 01/06/2020 03/12/18   Rai, Delene Ruffini, MD  hydrALAZINE (APRESOLINE) 50 MG tablet Take 1 tablet (50 mg total) by mouth 3 (three) times daily. Patient not taking: Reported on 01/06/2020 03/12/18   Cathren Harsh, MD  loperamide (IMODIUM) 2 MG capsule Take 1 capsule (2 mg total) by mouth as needed for  diarrhea or loose stools. Patient not taking: Reported on 01/06/2020 03/12/18   Cathren Harsh, MD  thiamine 100 MG tablet Take 1 tablet (100 mg total) by mouth daily. Patient not taking: Reported on 01/06/2020 03/12/18   Cathren Harsh, MD    ALLERGIES:  Allergies  Allergen Reactions  . Codeine Rash  . Penicillins Rash    SOCIAL HISTORY:  Social History   Tobacco Use  . Smoking status: Former Smoker    Packs/day: 0.50    Years: 45.00    Pack years: 22.50    Types: Cigarettes  . Smokeless tobacco: Never Used  Substance Use Topics  . Alcohol use: Yes    Comment: Drinks 6-8 beers daily    FAMILY HISTORY: Family History   Problem Relation Age of Onset  . Cancer Mother   . Cancer Father   . Multiple sclerosis Daughter     EXAM: BP (!) 150/98 (BP Location: Left Arm)   Pulse 71   Temp 98.3 F (36.8 C) (Oral)   Resp 16   SpO2 98%  CONSTITUTIONAL: Alert and oriented x3 and responds appropriately to questions.  Onikul ill-appearing.  Appears disheveled.  Elderly. HEAD: Normocephalic; atraumatic EYES: Conjunctivae clear, PERRL, EOMI ENT: normal nose; no rhinorrhea; moist mucous membranes; pharynx without lesions noted; no dental injury; no septal hematoma NECK: Supple, no meningismus, no LAD; no midline spinal tenderness, step-off or deformity; trachea midline CARD: Irregularly irregular and rate controlled; S1 and S2 appreciated; no murmurs, no clicks, no rubs, no gallops RESP: Normal chest excursion without splinting or tachypnea; breath sounds clear and equal bilaterally; no wheezes, no rhonchi, no rales; no hypoxia or respiratory distress CHEST:  chest wall stable, no crepitus or ecchymosis or deformity, nontender to palpation; no flail chest ABD/GI: Normal bowel sounds; non-distended; soft, non-tender, no rebound, no guarding; no ecchymosis or other lesions noted PELVIS:  stable, nontender to palpation, complains of left hip pain but is nontender to palpation, no leg length discrepancy BACK:  The back appears normal and is non-tender to palpation, there is no CVA tenderness; no midline spinal tenderness, step-off or deformity EXT: 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  His extremities are cool to touch with some discoloration of his toes which he reports is chronic.  No necrosis.  Unable to palpate pulses.  No erythema or warmth.  Compartments are soft.  No joint effusion.  No calf tenderness.  Symmetric edema in bilateral lower extremities.  I am able to Doppler a weak monophasic right PT and DP pulses.  I am not able to Doppler or palpate left PT or DP pulses.  He reports normal  sensation in both lower extremities.  Both legs are cool to touch. SKIN: Normal color for age and race; warm NEURO: Moves all extremities equally, no facial asymmetry, no dysarthria or aphasia, reports normal sensation diffusely PSYCH: The patient's mood and manner are appropriate. Grooming and personal hygiene are appropriate.  MEDICAL DECISION MAKING: Patient here with complaints of left hip pain and back pain and unable to get off the floor after a fall.  He states he did not hit his head or lose consciousness.  He appears alert and oriented x3.  I spoke to his daughter who is concerned that he has had multiple falls and she is worried that he should not be living by himself at home.  He does tell me that his last fall was 2 days ago and is not sure if he hit  his head at that time.  Will obtain x-rays of the left hip and lumbar spine.  We will also obtain CT of the head and cervical spine given multiple recent falls.  Labs show chronic kidney disease which appears stable but otherwise unremarkable.  We will add on CK level given he was on the floor for an unknown amount of time.  He does appear to have peripheral vascular disease and I am not able to Doppler or palpate pulses in the distal left lower extremity.  I suspect that this is chronic in nature.  He does not have any open wounds, no necrosis, no pain and no numbness.  I do not think this needs emergent vascular surgery consultation but he will need to see them as an outpatient.  His daughter is very concerned that he is unsafe at home and is requesting social work evaluation.  We will also consult physical therapy.  ED PROGRESS: Patient's urine shows no sign of infection.  LFTs, CK level unremarkable.  CT of the head and cervical spine show no acute abnormality.  X-ray of the left hip shows no acute abnormality.  X-ray of the lumbar spine shows possible L1 compression fracture.  Radiology recommended CT for further evaluation.  CT lumbar spine  shows a wedge compression fracture of L1 with approximately 10% height loss but no retropulsion or extension of the posterior elements.  Will place in TLSO brace.  He declines anything for pain at this time.  Patient agrees that he is unable to care for himself at home.  Agrees to social work/case management/PT evaluation.  I reviewed all nursing notes and pertinent previous records as available.  I have reviewed and interpreted any EKGs, lab and urine results, imaging (as available).    EKG Interpretation  Date/Time:  Thursday March 15 2020 19:40:49 EST Ventricular Rate:  87 PR Interval:    QRS Duration: 98 QT Interval:  408 QTC Calculation: 490 R Axis:   -63 Text Interpretation: Atrial fibrillation with premature ventricular or aberrantly conducted complexes Left anterior fascicular block Cannot rule out Inferior infarct , age undetermined Anterior infarct , age undetermined Abnormal ECG No significant change since 01/06/2020 Confirmed by Geoffery Lyons ([id number]) on 03/15/2020 8:22:33 PM        Patient gave verbal permission to utilize photo for medical documentation only. The image was not stored on any personal device.     Brian Mclaughlin was evaluated in Emergency Department on 03/16/2020 for the symptoms described in the history of present illness. He was evaluated in the context of the global COVID-19 pandemic, which necessitated consideration that the patient might be at risk for infection with the SARS-CoV-2 virus that causes COVID-19. Institutional protocols and algorithms that pertain to the evaluation of patients at risk for COVID-19 are in a state of rapid change based on information released by regulatory bodies including the CDC and federal and state organizations. These policies and algorithms were followed during the patient's care in the ED.       Jelena Malicoat, Layla Maw, DO 03/16/20 0430

## 2020-03-16 NOTE — TOC Initial Note (Signed)
Transition of Care [facility]) - Initial/Assessment Note    Patient Details  Name: Brian Mclaughlin MRN: [id number] Date of Birth: Jul 24, 1945  [facility]) CM/SW Contact:    Mercy Riding, LCSW Phone Number: 03/16/2020, 6:21 PM  Clinical Narrative:         Signed         Show:Clear all   TOC CM spoke to pt and gave permission to speak to dtr, Lonny Prude [phone]. Pt states he lives alone. Has RW at home. Gave permission to create FL2 and fax to SNF rehab. Will need insurance auth from [employer]. Isidoro Donning RN CCM, [facility] ED TOC CM [phone]                  Expected Discharge Plan: Skilled Nursing Facility Barriers to Discharge: Insurance Authorization   Patient Goals and CMS Choice Patient states their goals for this hospitalization and ongoing recovery are:: To regain strength voa rehab and recover      Expected Discharge Plan and Services Expected Discharge Plan: Skilled Nursing Facility   CSW will continue to follow for D/C needs.  Dorothe Pea. Matyas Baisley  MSW, LCSW, LCAS, CCS Transitions of Care Clinical Social Worker Care Coordination Department Ph: [phone]                                                  Prior Living Arrangements/Services   Lives with:: Self   Do you feel safe going back to the place where you live?: No      Need for Family Participation in Patient Care: Yes (Comment)        Activities of Daily Living      Permission Sought/Granted Permission sought to share information with : Facility Industrial/product designer granted to share information with : Yes, Verbal Permission Granted              Emotional Assessment              Admission diagnosis:  generalized weakness Patient Active Problem List   Diagnosis Date Noted  . Acute encephalopathy 01/06/2020  . Fall 01/06/2020  . Nicotine dependence, cigarettes, uncomplicated 07/28/2019  . Mixed hyperlipidemia 07/28/2019  .  Hyperkalemia 07/28/2019  . Cellulitis of right lower extremity 07/25/2019  . Generalized weakness 03/10/2018  . Fever 03/10/2018  . Chronic kidney disease, stage 3b (HCC) 03/10/2018  . Abnormality of gait 06/14/2014  . Chronic atrial fibrillation (HCC)   . Stroke, embolic (HCC)   . Noncompliance with medications   . Acute ischemic stroke (HCC) 05/24/2014  . CVA (cerebral infarction) 05/24/2014  . Chronic systolic CHF (congestive heart failure) (HCC)   . Stroke (HCC) 08/23/2011  . HTN (hypertension) 08/23/2011  . Alcohol abuse 08/23/2011  . LFTs abnormal 08/23/2011  . Acute renal failure (HCC) 08/23/2011  . Rhabdomyolysis 08/23/2011   PCP:  Patient, No Pcp Per Pharmacy:   [facility] [place], [place] - [address] [place] [address] Phone: [phone] Fax: [phone]     Social Determinants of Health (SDOH) Interventions    Readmission Risk Interventions Readmission Risk Prevention Plan 07/29/2019  Transportation Screening Complete  PCP or Specialist Appt within 5-7 Days Patient refused  Home Care Screening Patient refused  Medication Review (RN CM) Referral to Pharmacy  Some  recent data might be hidden

## 2020-03-16 NOTE — Progress Notes (Signed)
Orthopedic Tech Progress Note Patient Details:  Brian Mclaughlin 08-10-1945 [id number]  Patient ID: [name], male   DOB: [birth year], 74 y.o.   MRN: [id number]   Saul Fordyce 03/16/2020, 7:39 AMCalled and Routed TLSO brace order to [facility].

## 2020-03-16 NOTE — Progress Notes (Addendum)
Orthopedic Tech Progress Note Patient Details:  Brian Mclaughlin 1945/06/12 [id number] Spoke with RN about patient needing a TLSO BRACE. so I had to order it STAT from [facility] Patient ID: Brian Mclaughlin, male   DOB: 1945/12/25, 74 y.o.   MRN: [id number]   Donald Pore 03/16/2020, 7:12 AM

## 2020-03-16 NOTE — ED Notes (Signed)
Meal tray given 

## 2020-03-16 NOTE — Evaluation (Signed)
Physical Therapy Evaluation Patient Details Name: Brian Mclaughlin MRN: [id number] DOB: 11-10-1945 Today's Date: 03/16/2020   History of Present Illness  Brian Mclaughlin is 74 y.o male with medical history of CVA, CKD, afib, CHF, HTN, alochol abuse who presents after fall at home with L hip and back pain; CT revealed wedge compression fx of L1.  Clinical Impression  Pt admitted with above diagnosis. Pt with increased pain and generalized weakness, requiring increased time, max multimodal cues and 2 person assist with bed mobility. Upon rolling, pt noted to be soiled in urine, nurse tech assisted with rolling pt to change linen/provide pericare. Therapist repositioned TLSO to appropriate location. Educated pt on log rolling technique and purpose/benefit of skilled PT interventions and pt in agreement with SNF recommendation. PLOF difficult to obtain and no family present during eval, but pt reports independent with ADLs and using RW around the home and in community with multiple falls. Pt unable to attempt sit to supine or standing this session due to pain and fatigue with bed mobility. Pt currently with functional limitations due to the deficits listed below (see PT Problem List). Pt will benefit from skilled PT to increase their independence and safety with mobility to allow discharge to the venue listed below.       Follow Up Recommendations SNF    Equipment Recommendations  None recommended by PT    Recommendations for Other Services OT consult     Precautions / Restrictions Precautions Precautions: Fall;Back Required Braces or Orthoses: Spinal Brace Spinal Brace: Thoracolumbosacral orthotic Restrictions Weight Bearing Restrictions: No      Mobility  Bed Mobility Overal bed mobility: Needs Assistance Bed Mobility: Rolling Rolling: Max assist;+2 for physical assistance    General bed mobility comments: max multimodal cues for hand placement and LE placement to complete log rolling R and  L, therapist guiding pt hand onto/off bed rail intermittently to improve mobility and maintian log rolling position    Transfers   General transfer comment: not attempted  Ambulation/Gait  General Gait Details: not attempted  Stairs            Wheelchair Mobility    Modified Rankin (Stroke Patients Only)       Balance Overall balance assessment: History of Falls            Pertinent Vitals/Pain Pain Assessment: Faces Faces Pain Scale: Hurts little more Pain Location: back with rolling Pain Descriptors / Indicators: Grimacing;Guarding Pain Intervention(s): Limited activity within patient's tolerance;Monitored during session;Repositioned    Home Living Family/patient expects to be discharged to:: Private residence Living Arrangements: Alone Available Help at Discharge: Family Type of Home: House Home Access: Stairs to enter Entrance Stairs-Rails: None Secretary/administrator of Steps: 1 Home Layout: One level Home Equipment: Environmental consultant - 2 wheels      Prior Function Level of Independence: Independent with assistive device(s)         Comments: Pt with some difficulty answering questions, reports he uses RW around the home/community and independent with ADLs.     Hand Dominance        Extremity/Trunk Assessment   Upper Extremity Assessment Upper Extremity Assessment: Generalized weakness    Lower Extremity Assessment Lower Extremity Assessment: Generalized weakness (denies numbness/tingling throughout, AROM in supine WNL, discoloration throughout BLE noted, strength 2/5 throughout BLE)    Cervical / Trunk Assessment Cervical / Trunk Assessment: Normal  Communication   Communication: No difficulties  Cognition Arousal/Alertness: Awake/alert Behavior During Therapy: WFL for tasks assessed/performed Overall Cognitive  Status: No family/caregiver present to determine baseline cognitive functioning  General Comments: Pt oriented to self, location and  fall, unable to state date.      General Comments      Exercises     Assessment/Plan    PT Assessment Patient needs continued PT services  PT Problem List Decreased strength;Decreased range of motion;Decreased activity tolerance;Decreased balance;Decreased mobility;Decreased cognition;Decreased knowledge of use of DME;Decreased safety awareness;Decreased skin integrity;Pain       PT Treatment Interventions DME instruction;Gait training;Functional mobility training;Therapeutic activities;Therapeutic exercise;Balance training;Neuromuscular re-education;Cognitive remediation;Patient/family education    PT Goals (Current goals can be found in the Care Plan section)  Acute Rehab PT Goals Patient Stated Goal: "not fall" PT Goal Formulation: With patient Time For Goal Achievement: 03/30/20 Potential to Achieve Goals: Fair    Frequency Min 2X/week   Barriers to discharge        Co-evaluation               AM-PAC PT "6 Clicks" Mobility  Outcome Measure Help needed turning from your back to your side while in a flat bed without using bedrails?: Total Help needed moving from lying on your back to sitting on the side of a flat bed without using bedrails?: Total Help needed moving to and from a bed to a chair (including a wheelchair)?: Total Help needed standing up from a chair using your arms (e.g., wheelchair or bedside chair)?: Total Help needed to walk in hospital room?: Total Help needed climbing 3-5 steps with a railing? : Total 6 Click Score: 6    End of Session Equipment Utilized During Treatment: Back brace Activity Tolerance: Patient limited by pain;Patient limited by fatigue Patient left: in bed;with call bell/phone within reach;with nursing/sitter in room Nurse Communication: Mobility status PT Visit Diagnosis: Other abnormalities of gait and mobility (R26.89);Muscle weakness (generalized) (M62.81);History of falling (Z91.81);Pain    Time: 0921-0950 PT Time  Calculation (min) (ACUTE ONLY): 29 min   Charges:   PT Evaluation $PT Eval Moderate Complexity: 1 Mod PT Treatments $Therapeutic Activity: 8-22 mins         Tori Cassidy Tabet PT, DPT 03/16/20, 10:24 AM

## 2020-03-16 NOTE — Progress Notes (Signed)
TOC CM spoke to pt and gave permission to speak to dtr, Lonny Prude [phone]. Pt states he lives alone. Has RW at home. Gave permission to create FL2 and fax to SNF rehab. Will need insurance auth from [employer]. Isidoro Donning RN CCM, [facility] ED TOC CM [phone]

## 2020-03-16 NOTE — ED Notes (Signed)
Called ortho tech for TLSO brace@05 :05am.

## 2020-03-16 NOTE — NC FL2 (Addendum)
[place] MEDICAID FL2 LEVEL OF CARE SCREENING TOOL     IDENTIFICATION  Patient Name: Brian Mclaughlin Birthdate: 1945/07/30 Sex: male Admission Date (Current Location): 03/16/2020  [facility] and [place] Number:  Producer, television/film/video and Address:  [facility],  [address], [place] [address]      Provider Number: [id number]  Attending Physician Name and Address:  Default, Provider, MD  Relative Name and Phone Number:       Current Level of Care: Hospital Recommended Level of Care: Skilled Nursing Facility Prior Approval Number:    Date Approved/Denied:   PASRR Number: [id number]  Discharge Plan: SNF    Current Diagnoses: Patient Active Problem List   Diagnosis Date Noted  . Acute encephalopathy 01/06/2020  . Fall 01/06/2020  . Nicotine dependence, cigarettes, uncomplicated 07/28/2019  . Mixed hyperlipidemia 07/28/2019  . Hyperkalemia 07/28/2019  . Cellulitis of right lower extremity 07/25/2019  . Generalized weakness 03/10/2018  . Fever 03/10/2018  . Chronic kidney disease, stage 3b (HCC) 03/10/2018  . Abnormality of gait 06/14/2014  . Chronic atrial fibrillation (HCC)   . Stroke, embolic (HCC)   . Noncompliance with medications   . Acute ischemic stroke (HCC) 05/24/2014  . CVA (cerebral infarction) 05/24/2014  . Chronic systolic CHF (congestive heart failure) (HCC)   . Stroke (HCC) 08/23/2011  . HTN (hypertension) 08/23/2011  . Alcohol abuse 08/23/2011  . LFTs abnormal 08/23/2011  . Acute renal failure (HCC) 08/23/2011  . Rhabdomyolysis 08/23/2011    Orientation RESPIRATION BLADDER Height & Weight     Self,Time,Situation,Place  Normal Continent Weight:   Height:     BEHAVIORAL SYMPTOMS/MOOD NEUROLOGICAL BOWEL NUTRITION STATUS      Continent Diet (heart healthy, thin liquids)  AMBULATORY STATUS COMMUNICATION OF NEEDS Skin   Extensive Assist Verbally Normal                       Personal Care Assistance Level of Assistance   Bathing,Feeding,Dressing Bathing Assistance: Maximum assistance Feeding assistance: Independent Dressing Assistance: Maximum assistance     Functional Limitations Info  Sight,Hearing,Speech Sight Info: Adequate Hearing Info: Adequate Speech Info: Adequate    SPECIAL CARE FACTORS FREQUENCY  PT (By licensed PT),OT (By licensed OT)     PT Frequency: 5x OT Frequency: 5x            Contractures Contractures Info: Not present    Additional Factors Info  Code Status,Allergies Code Status Info: full code Allergies Info: Codeine, Penicillins           Current Medications (03/16/2020):  This is the current hospital active medication list Current Facility-Administered Medications  Medication Dose Route Frequency Provider Last Rate Last Admin  . atorvastatin (LIPITOR) tablet 20 mg  20 mg Oral QHS Ward, Kristen N, DO      . carvedilol (COREG) tablet 3.125 mg  3.125 mg Oral BID WC Ward, Kristen N, DO   3.125 mg at 03/16/20 1131  . hydrALAZINE (APRESOLINE) tablet 50 mg  50 mg Oral TID Ward, Kristen N, DO   50 mg at 03/16/20 1131  . thiamine tablet 100 mg  100 mg Oral Daily Ward, Kristen N, DO   100 mg at 03/16/20 1131   Current Outpatient Medications  Medication Sig Dispense Refill  . atorvastatin (LIPITOR) 20 MG tablet Take 1 tablet (20 mg total) by mouth at bedtime. (Patient not taking: No sig reported) 30 tablet 3  . carvedilol (COREG) 3.125 MG tablet Take 1 tablet (3.125  mg total) by mouth 2 (two) times daily with a meal. (Patient not taking: No sig reported) 60 tablet 3  . thiamine 100 MG tablet Take 1 tablet (100 mg total) by mouth daily. (Patient not taking: No sig reported) 30 tablet 3     Discharge Medications: Please see discharge summary for a list of discharge medications.  Relevant Imaging Results:  Relevant Lab Results:   Additional Information SSN:[id number]  Bridget A Cobb, LCSW

## 2020-03-17 NOTE — ED Notes (Signed)
Pt changed and repositioned.

## 2020-03-17 NOTE — ED Notes (Signed)
Bed linen and gown changed, pt cleaned, and placed on condom catheter.

## 2020-03-17 NOTE — Care Management (Signed)
No SNF acceptances  still pending.

## 2020-03-18 NOTE — Progress Notes (Signed)
CSW reviewed HUB and there are no bed offers available for this patient.   TOC staff will continue to follow for discharge planning.  Edwin Dada, MSW, LCSW-A Transitions of Care  Clinical Social Worker I [facility] Emergency Departments  Medical ICU [phone]

## 2020-03-18 NOTE — ED Notes (Signed)
Patient keeps removing  brace and states brace hurts.

## 2020-03-19 DIAGNOSIS — S32010A Wedge compression fracture of first lumbar vertebra, initial encounter for closed fracture: Secondary | ICD-10-CM | POA: Diagnosis not present

## 2020-03-19 DIAGNOSIS — U071 COVID-19: Secondary | ICD-10-CM | POA: Diagnosis not present

## 2020-03-19 DIAGNOSIS — R296 Repeated falls: Secondary | ICD-10-CM | POA: Diagnosis not present

## 2020-03-19 DIAGNOSIS — I739 Peripheral vascular disease, unspecified: Secondary | ICD-10-CM | POA: Diagnosis not present

## 2020-03-19 NOTE — ED Notes (Signed)
Patient changed and repositioned.

## 2020-03-19 NOTE — TOC Progression Note (Signed)
Transition of Care [facility]) - Progression Note    Patient Details  Name: Brian Mclaughlin MRN: [id number] Date of Birth: 02-17-1946  Transition of Care [facility]) CM/SW Contact  Darleene Cleaver, [place] Phone Number: 03/19/2020, 2:25 PM  Clinical Narrative:    Patient's therapy notes were sent to different facilities.  CSW awaiting for bed offers.   Expected Discharge Plan: Skilled Nursing Facility Barriers to Discharge: Insurance Authorization  Expected Discharge Plan and Services Expected Discharge Plan: Skilled Nursing Facility                                               Social Determinants of Health (SDOH) Interventions    Readmission Risk Interventions Readmission Risk Prevention Plan 07/29/2019  Transportation Screening Complete  PCP or Specialist Appt within 5-7 Days Patient refused  Home Care Screening Patient refused  Medication Review (RN CM) Referral to Pharmacy  Some recent data might be hidden

## 2020-03-19 NOTE — Progress Notes (Signed)
Physical Therapy Treatment Patient Details Name: Brian Mclaughlin MRN: [id number] DOB: 04/03/1945 Today's Date: 03/19/2020    History of Present Illness Lloyd Ayo is 75 y.o male with medical history of CVA, CKD, afib, CHF, HTN, alochol abuse who presents after fall at home with L hip and back pain; CT revealed wedge compression fx of L1.    PT Comments    Patient generally  Sluggish, follows 1 step with increased time. Nonverbal or slurred. Assisted with rollong on stretcher for repositioning. Patient with increased tone of the right extremeties. Noted little use to assist in rolling. Did not attempt to sit up due to requiring extensive assistnace. Continue PT attempts.  Follow Up Recommendations  SNF     Equipment Recommendations  None recommended by PT    Recommendations for Other Services       Precautions / Restrictions Precautions Precautions: Fall;Back Required Braces or Orthoses: Spinal Brace Spinal Brace: Thoracolumbosacral orthotic Restrictions Other Position/Activity Restrictions: not specified  when to [facility]    Mobility  Bed Mobility   Bed Mobility: Rolling Rolling: Max assist         General bed mobility comments: multimodal cues. Assised to f;ex RLE to facilitate rolling to each side. patient genreally retless. Did not attempt to sit up, quite tight increased tone right arm and leg with rolling.  Transfers                 General transfer comment: not attempted  Ambulation/Gait                 Stairs             Wheelchair Mobility    Modified Rankin (Stroke Patients Only)       Balance                                            Cognition Arousal/Alertness: Lethargic Behavior During Therapy: Flat affect Overall Cognitive Status: Impaired/Different from baseline                                 General Comments: pt. did not answer any orientation questions, generally sedate and lethargic. Did  follow simple multimodla cues to rioll.      Exercises      General Comments        Pertinent Vitals/Pain Pain Assessment: Faces Faces Pain Scale: Hurts a little bit Pain Location: back with rolling Pain Descriptors / Indicators: Grimacing;Guarding Pain Intervention(s): Limited activity within patient's tolerance    Home Living                      Prior Function            PT Goals (current goals can now be found in the care plan section) Progress towards PT goals: Not progressing toward goals - comment (very slow and lethargic)    Frequency    Min 2X/week      PT Plan Current plan remains appropriate    Co-evaluation              AM-PAC PT "6 Clicks" Mobility   Outcome Measure  Help needed turning from your back to your side while in a flat bed without using bedrails?: Total Help needed moving from lying on your back to  sitting on the side of a flat bed without using bedrails?: Total Help needed moving to and from a bed to a chair (including a wheelchair)?: Total Help needed standing up from a chair using your arms (e.g., wheelchair or bedside chair)?: Total Help needed to walk in hospital room?: Total Help needed climbing 3-5 steps with a railing? : Total 6 Click Score: 6    End of Session Equipment Utilized During Treatment: Back brace Activity Tolerance: Patient limited by lethargy Patient left: in bed;with nursing/sitter in room Nurse Communication: Mobility status PT Visit Diagnosis: Other abnormalities of gait and mobility (R26.89);Muscle weakness (generalized) (M62.81);History of falling (Z91.81);Pain     Time: 1255-1311 PT Time Calculation (min) (ACUTE ONLY): 16 min  Charges:  $Therapeutic Activity: 8-22 mins                     Blanchard Kelch PT Acute Rehabilitation Services Pager [phone] Office [phone]    Rada Hay 03/19/2020, 1:51 PM

## 2020-03-19 NOTE — ED Notes (Signed)
Patient has pulled off his condom catheter. Patient cleaned, changed, and repositioned.

## 2020-03-19 NOTE — ED Provider Notes (Signed)
Emergency Medicine Observation Re-evaluation Note  Brian Mclaughlin is a 75 y.o. male, seen on rounds today.  Pt initially presented to the ED for complaints of Decreased appetite and Fatigue Currently, the patient is asleep.  Physical Exam  BP (!) 168/91 (BP Location: Left Arm)   Pulse 70   Temp 98.1 F (36.7 C) (Oral)   Resp 16   Ht 6' (1.829 m)   Wt 93 kg   SpO2 96%   BMI 27.81 kg/m  Physical Exam General: Asleep Cardiac: Not evaluated Lungs: Normal respiratory effort Psych: Asleep  ED Course / MDM  EKG:EKG Interpretation  Date/Time:  Friday March 16 2020 02:27:11 EST Ventricular Rate:  84 PR Interval:    QRS Duration: 114 QT Interval:  448 QTC Calculation: 511 R Axis:   -5 Text Interpretation: Atrial fibrillation Borderline intraventricular conduction delay Abnormal R-wave progression, late transition Borderline T abnormalities, inferior leads Prolonged QT interval 12 Lead; Mason-Likar background noise TECHNICALLY DIFFICULT Otherwise no significant change Confirmed by Melene Plan [phone]) on 03/17/2020 2:09:48 PM    I have reviewed the labs performed to date as well as medications administered while in observation.  No acute events overnight reported by staff.  Plan  Current plan:  TOC attempting SNF placement.    Wynetta Fines, MD 03/19/20 [phone]

## 2020-03-19 NOTE — ED Notes (Signed)
Patient is confused and not responding appropriately. Patient had to be fed breakfast of which he ate only about 20%.

## 2020-03-20 ENCOUNTER — Emergency Department (HOSPITAL_COMMUNITY): Payer: Medicare HMO

## 2020-03-20 ENCOUNTER — Inpatient Hospital Stay (HOSPITAL_COMMUNITY): Payer: Medicare HMO

## 2020-03-20 DIAGNOSIS — E782 Mixed hyperlipidemia: Secondary | ICD-10-CM | POA: Diagnosis not present

## 2020-03-20 DIAGNOSIS — G9389 Other specified disorders of brain: Secondary | ICD-10-CM | POA: Diagnosis not present

## 2020-03-20 DIAGNOSIS — R4182 Altered mental status, unspecified: Secondary | ICD-10-CM | POA: Diagnosis not present

## 2020-03-20 DIAGNOSIS — G9340 Encephalopathy, unspecified: Secondary | ICD-10-CM | POA: Diagnosis not present

## 2020-03-20 DIAGNOSIS — I2699 Other pulmonary embolism without acute cor pulmonale: Secondary | ICD-10-CM | POA: Diagnosis not present

## 2020-03-20 DIAGNOSIS — Z743 Need for continuous supervision: Secondary | ICD-10-CM | POA: Diagnosis not present

## 2020-03-20 DIAGNOSIS — Z7189 Other specified counseling: Secondary | ICD-10-CM | POA: Diagnosis not present

## 2020-03-20 DIAGNOSIS — R296 Repeated falls: Secondary | ICD-10-CM | POA: Diagnosis not present

## 2020-03-20 DIAGNOSIS — H53461 Homonymous bilateral field defects, right side: Secondary | ICD-10-CM | POA: Diagnosis present

## 2020-03-20 DIAGNOSIS — I5022 Chronic systolic (congestive) heart failure: Secondary | ICD-10-CM | POA: Diagnosis not present

## 2020-03-20 DIAGNOSIS — R627 Adult failure to thrive: Secondary | ICD-10-CM | POA: Diagnosis not present

## 2020-03-20 DIAGNOSIS — I517 Cardiomegaly: Secondary | ICD-10-CM | POA: Diagnosis not present

## 2020-03-20 DIAGNOSIS — W19XXXA Unspecified fall, initial encounter: Secondary | ICD-10-CM | POA: Diagnosis not present

## 2020-03-20 DIAGNOSIS — I639 Cerebral infarction, unspecified: Secondary | ICD-10-CM | POA: Diagnosis not present

## 2020-03-20 DIAGNOSIS — R471 Dysarthria and anarthria: Secondary | ICD-10-CM | POA: Diagnosis present

## 2020-03-20 DIAGNOSIS — N1832 Chronic kidney disease, stage 3b: Secondary | ICD-10-CM | POA: Diagnosis not present

## 2020-03-20 DIAGNOSIS — Z87891 Personal history of nicotine dependence: Secondary | ICD-10-CM | POA: Diagnosis not present

## 2020-03-20 DIAGNOSIS — Y92009 Unspecified place in unspecified non-institutional (private) residence as the place of occurrence of the external cause: Secondary | ICD-10-CM | POA: Diagnosis not present

## 2020-03-20 DIAGNOSIS — H748X3 Other specified disorders of middle ear and mastoid, bilateral: Secondary | ICD-10-CM | POA: Diagnosis not present

## 2020-03-20 DIAGNOSIS — D6959 Other secondary thrombocytopenia: Secondary | ICD-10-CM | POA: Diagnosis present

## 2020-03-20 DIAGNOSIS — F1010 Alcohol abuse, uncomplicated: Secondary | ICD-10-CM

## 2020-03-20 DIAGNOSIS — Z880 Allergy status to penicillin: Secondary | ICD-10-CM | POA: Diagnosis not present

## 2020-03-20 DIAGNOSIS — I10 Essential (primary) hypertension: Secondary | ICD-10-CM | POA: Diagnosis not present

## 2020-03-20 DIAGNOSIS — S32010A Wedge compression fracture of first lumbar vertebra, initial encounter for closed fracture: Secondary | ICD-10-CM | POA: Diagnosis not present

## 2020-03-20 DIAGNOSIS — Z7401 Bed confinement status: Secondary | ICD-10-CM | POA: Diagnosis not present

## 2020-03-20 DIAGNOSIS — I739 Peripheral vascular disease, unspecified: Secondary | ICD-10-CM | POA: Diagnosis not present

## 2020-03-20 DIAGNOSIS — E860 Dehydration: Secondary | ICD-10-CM | POA: Diagnosis present

## 2020-03-20 DIAGNOSIS — M6281 Muscle weakness (generalized): Secondary | ICD-10-CM | POA: Diagnosis not present

## 2020-03-20 DIAGNOSIS — I4891 Unspecified atrial fibrillation: Secondary | ICD-10-CM | POA: Diagnosis not present

## 2020-03-20 DIAGNOSIS — M2550 Pain in unspecified joint: Secondary | ICD-10-CM | POA: Diagnosis not present

## 2020-03-20 DIAGNOSIS — R059 Cough, unspecified: Secondary | ICD-10-CM | POA: Diagnosis not present

## 2020-03-20 DIAGNOSIS — I6503 Occlusion and stenosis of bilateral vertebral arteries: Secondary | ICD-10-CM | POA: Diagnosis not present

## 2020-03-20 DIAGNOSIS — R4701 Aphasia: Secondary | ICD-10-CM | POA: Diagnosis present

## 2020-03-20 DIAGNOSIS — W1830XA Fall on same level, unspecified, initial encounter: Secondary | ICD-10-CM | POA: Diagnosis not present

## 2020-03-20 DIAGNOSIS — U071 COVID-19: Secondary | ICD-10-CM

## 2020-03-20 DIAGNOSIS — R0602 Shortness of breath: Secondary | ICD-10-CM | POA: Diagnosis not present

## 2020-03-20 DIAGNOSIS — Z515 Encounter for palliative care: Secondary | ICD-10-CM | POA: Diagnosis not present

## 2020-03-20 DIAGNOSIS — J9811 Atelectasis: Secondary | ICD-10-CM | POA: Diagnosis not present

## 2020-03-20 DIAGNOSIS — J690 Pneumonitis due to inhalation of food and vomit: Secondary | ICD-10-CM | POA: Diagnosis not present

## 2020-03-20 DIAGNOSIS — G8191 Hemiplegia, unspecified affecting right dominant side: Secondary | ICD-10-CM | POA: Diagnosis present

## 2020-03-20 DIAGNOSIS — J90 Pleural effusion, not elsewhere classified: Secondary | ICD-10-CM | POA: Diagnosis not present

## 2020-03-20 DIAGNOSIS — S32000A Wedge compression fracture of unspecified lumbar vertebra, initial encounter for closed fracture: Secondary | ICD-10-CM | POA: Diagnosis not present

## 2020-03-20 DIAGNOSIS — I482 Chronic atrial fibrillation, unspecified: Secondary | ICD-10-CM

## 2020-03-20 DIAGNOSIS — Z8673 Personal history of transient ischemic attack (TIA), and cerebral infarction without residual deficits: Secondary | ICD-10-CM | POA: Diagnosis not present

## 2020-03-20 DIAGNOSIS — G459 Transient cerebral ischemic attack, unspecified: Secondary | ICD-10-CM | POA: Diagnosis not present

## 2020-03-20 DIAGNOSIS — M50322 Other cervical disc degeneration at C5-C6 level: Secondary | ICD-10-CM | POA: Diagnosis not present

## 2020-03-20 DIAGNOSIS — G9349 Other encephalopathy: Secondary | ICD-10-CM | POA: Diagnosis present

## 2020-03-20 DIAGNOSIS — I6521 Occlusion and stenosis of right carotid artery: Secondary | ICD-10-CM | POA: Diagnosis not present

## 2020-03-20 DIAGNOSIS — R29810 Facial weakness: Secondary | ICD-10-CM | POA: Diagnosis present

## 2020-03-20 DIAGNOSIS — I130 Hypertensive heart and chronic kidney disease with heart failure and stage 1 through stage 4 chronic kidney disease, or unspecified chronic kidney disease: Secondary | ICD-10-CM | POA: Diagnosis not present

## 2020-03-20 DIAGNOSIS — Z66 Do not resuscitate: Secondary | ICD-10-CM | POA: Diagnosis not present

## 2020-03-20 DIAGNOSIS — I63512 Cerebral infarction due to unspecified occlusion or stenosis of left middle cerebral artery: Secondary | ICD-10-CM | POA: Diagnosis not present

## 2020-03-20 DIAGNOSIS — I480 Paroxysmal atrial fibrillation: Secondary | ICD-10-CM | POA: Diagnosis not present

## 2020-03-20 DIAGNOSIS — R69 Illness, unspecified: Secondary | ICD-10-CM | POA: Diagnosis not present

## 2020-03-20 DIAGNOSIS — E785 Hyperlipidemia, unspecified: Secondary | ICD-10-CM | POA: Diagnosis not present

## 2020-03-20 DIAGNOSIS — I959 Hypotension, unspecified: Secondary | ICD-10-CM | POA: Diagnosis present

## 2020-03-20 LAB — CBC WITH DIFFERENTIAL/PLATELET
Abs Immature Granulocytes: 0.04 10*3/uL (ref 0.00–0.07)
Basophils Absolute: 0 10*3/uL (ref 0.0–0.1)
Basophils Relative: 1 %
Eosinophils Absolute: 0.1 10*3/uL (ref 0.0–0.5)
Eosinophils Relative: 1 %
HCT: 43.2 % (ref 39.0–52.0)
Hemoglobin: 14.4 g/dL (ref 13.0–17.0)
Immature Granulocytes: 1 %
Lymphocytes Relative: 8 %
Lymphs Abs: 0.5 10*3/uL — ABNORMAL LOW (ref 0.7–4.0)
MCH: 32.9 pg (ref 26.0–34.0)
MCHC: 33.3 g/dL (ref 30.0–36.0)
MCV: 98.6 fL (ref 80.0–100.0)
Monocytes Absolute: 1 10*3/uL (ref 0.1–1.0)
Monocytes Relative: 17 %
Neutro Abs: 4.2 10*3/uL (ref 1.7–7.7)
Neutrophils Relative %: 72 %
Platelets: 120 10*3/uL — ABNORMAL LOW (ref 150–400)
RBC: 4.38 MIL/uL (ref 4.22–5.81)
RDW: 13.5 % (ref 11.5–15.5)
WBC: 5.7 10*3/uL (ref 4.0–10.5)
nRBC: 0 % (ref 0.0–0.2)

## 2020-03-20 LAB — LACTATE DEHYDROGENASE: LDH: 220 U/L — ABNORMAL HIGH (ref 98–192)

## 2020-03-20 LAB — D-DIMER, QUANTITATIVE: D-Dimer, Quant: 19.24 ug/mL-FEU — ABNORMAL HIGH (ref 0.00–0.50)

## 2020-03-20 LAB — COMPREHENSIVE METABOLIC PANEL
ALT: 18 U/L (ref 0–44)
AST: 46 U/L — ABNORMAL HIGH (ref 15–41)
Albumin: 3.2 g/dL — ABNORMAL LOW (ref 3.5–5.0)
Alkaline Phosphatase: 60 U/L (ref 38–126)
Anion gap: 9 (ref 5–15)
BUN: 32 mg/dL — ABNORMAL HIGH (ref 8–23)
CO2: 23 mmol/L (ref 22–32)
Calcium: 8.7 mg/dL — ABNORMAL LOW (ref 8.9–10.3)
Chloride: 102 mmol/L (ref 98–111)
Creatinine, Ser: 1.33 mg/dL — ABNORMAL HIGH (ref 0.61–1.24)
GFR, Estimated: 56 mL/min — ABNORMAL LOW (ref >60)
Glucose, Bld: 121 mg/dL — ABNORMAL HIGH (ref 70–99)
Potassium: 4.6 mmol/L (ref 3.5–5.1)
Sodium: 134 mmol/L — ABNORMAL LOW (ref 135–145)
Total Bilirubin: 1.6 mg/dL — ABNORMAL HIGH (ref 0.3–1.2)
Total Protein: 6.9 g/dL (ref 6.5–8.1)

## 2020-03-20 LAB — POC SARS CORONAVIRUS 2 AG -  ED: SARS Coronavirus 2 Ag: POSITIVE — AB

## 2020-03-20 LAB — C-REACTIVE PROTEIN: CRP: 12.8 mg/dL — ABNORMAL HIGH (ref <1.0)

## 2020-03-20 LAB — BLOOD GAS, VENOUS
Acid-Base Excess: 0.4 mmol/L (ref 0.0–2.0)
Bicarbonate: 24 mmol/L (ref 20.0–28.0)
O2 Saturation: 74 %
Patient temperature: 98.6
pCO2, Ven: 37.3 mmHg — ABNORMAL LOW (ref 44.0–60.0)
pH, Ven: 7.425 (ref 7.250–7.430)
pO2, Ven: 40.9 mmHg (ref 32.0–45.0)

## 2020-03-20 LAB — PROCALCITONIN: Procalcitonin: 0.11 ng/mL

## 2020-03-20 LAB — MAGNESIUM: Magnesium: 1.8 mg/dL (ref 1.7–2.4)

## 2020-03-20 LAB — AMMONIA: Ammonia: 26 umol/L (ref 9–35)

## 2020-03-20 LAB — BRAIN NATRIURETIC PEPTIDE: B Natriuretic Peptide: 268 pg/mL — ABNORMAL HIGH (ref 0.0–100.0)

## 2020-03-20 LAB — FERRITIN: Ferritin: 389 ng/mL — ABNORMAL HIGH (ref 24–336)

## 2020-03-20 LAB — FIBRINOGEN: Fibrinogen: 374 mg/dL (ref 210–475)

## 2020-03-20 LAB — PHOSPHORUS: Phosphorus: 2.7 mg/dL (ref 2.5–4.6)

## 2020-03-20 MED ORDER — GUAIFENESIN-DM 100-10 MG/5ML PO SYRP: 10.0000 mL | ORAL_SOLUTION | ORAL | Status: DC | PRN

## 2020-03-20 MED ORDER — LORAZEPAM 1 MG PO TABS: 1.0000 mg | ORAL_TABLET | ORAL | Status: DC | PRN

## 2020-03-20 MED ORDER — IOHEXOL 350 MG/ML SOLN
75.0000 mL | Freq: Once | INTRAVENOUS | Status: AC | PRN
  Administered 2020-03-20: 75 mL via INTRAVENOUS

## 2020-03-20 MED ORDER — SODIUM CHLORIDE 0.9% FLUSH
3.0000 mL | Freq: Two times a day (BID) | INTRAVENOUS | Status: DC
  Administered 2020-03-20 – 2020-03-28 (×15): 3 mL via INTRAVENOUS

## 2020-03-20 MED ORDER — SODIUM CHLORIDE 0.9 % IV SOLN
500.0000 mg | INTRAVENOUS | Status: DC
  Administered 2020-03-20 – 2020-03-22 (×3): 500 mg via INTRAVENOUS
  Filled 2020-03-20 (×4): qty 500

## 2020-03-20 MED ORDER — ADULT MULTIVITAMIN W/MINERALS CH: 1.0000 | ORAL_TABLET | Freq: Every day | ORAL | Status: DC

## 2020-03-20 MED ORDER — SODIUM CHLORIDE 0.9 % IV SOLN
100.0000 mg | Freq: Every day | INTRAVENOUS | Status: DC
  Administered 2020-03-21 – 2020-03-23 (×3): 100 mg via INTRAVENOUS
  Filled 2020-03-20 (×4): qty 20

## 2020-03-20 MED ORDER — LACTATED RINGERS IV SOLN: INTRAVENOUS | Status: AC

## 2020-03-20 MED ORDER — HEPARIN (PORCINE) 25000 UT/250ML-% IV SOLN
1400.0000 [IU]/h | INTRAVENOUS | Status: DC
  Administered 2020-03-20: 1400 [IU]/h via INTRAVENOUS
  Filled 2020-03-20: qty 250

## 2020-03-20 MED ORDER — THIAMINE HCL 100 MG PO TABS: 100.0000 mg | ORAL_TABLET | Freq: Every day | ORAL | Status: DC

## 2020-03-20 MED ORDER — HEPARIN BOLUS VIA INFUSION
2500.0000 [IU] | Freq: Once | INTRAVENOUS | Status: DC
  Administered 2020-03-20: 2500 [IU] via INTRAVENOUS
  Filled 2020-03-20: qty 2500

## 2020-03-20 MED ORDER — METOPROLOL TARTRATE 5 MG/5ML IV SOLN
2.5000 mg | Freq: Four times a day (QID) | INTRAVENOUS | Status: DC
  Administered 2020-03-20 – 2020-03-21 (×5): 2.5 mg via INTRAVENOUS
  Filled 2020-03-20 (×4): qty 5

## 2020-03-20 MED ORDER — LORAZEPAM 2 MG/ML IJ SOLN
1.0000 mg | INTRAMUSCULAR | Status: DC | PRN
Start: 2020-03-20 — End: 2020-03-20

## 2020-03-20 MED ORDER — FOLIC ACID 1 MG PO TABS: 1.0000 mg | ORAL_TABLET | Freq: Every day | ORAL | Status: DC

## 2020-03-20 MED ORDER — IPRATROPIUM-ALBUTEROL 20-100 MCG/ACT IN AERS
1.0000 | INHALATION_SPRAY | Freq: Four times a day (QID) | RESPIRATORY_TRACT | Status: DC | PRN
  Filled 2020-03-20: qty 4

## 2020-03-20 MED ORDER — ENOXAPARIN SODIUM 40 MG/0.4ML ~~LOC~~ SOLN
40.0000 mg | SUBCUTANEOUS | Status: DC
  Administered 2020-03-20: 40 mg via SUBCUTANEOUS
  Filled 2020-03-20: qty 0.4

## 2020-03-20 MED ORDER — SODIUM CHLORIDE 0.9 % IV SOLN: 1.0000 g | INTRAVENOUS | Status: DC

## 2020-03-20 MED ORDER — METOPROLOL TARTRATE 5 MG/5ML IV SOLN: 2.5000 mg | Freq: Four times a day (QID) | INTRAVENOUS | Status: DC | PRN

## 2020-03-20 MED ORDER — SODIUM CHLORIDE 0.9 % IV SOLN
2.0000 g | INTRAVENOUS | Status: DC
  Administered 2020-03-20 – 2020-03-22 (×3): 2 g via INTRAVENOUS
  Filled 2020-03-20 (×4): qty 20

## 2020-03-20 MED ORDER — MAGNESIUM SULFATE 2 GM/50ML IV SOLN
2.0000 g | Freq: Once | INTRAVENOUS | Status: AC
  Administered 2020-03-20: 2 g via INTRAVENOUS
  Filled 2020-03-20: qty 50

## 2020-03-20 MED ORDER — THIAMINE HCL 100 MG/ML IJ SOLN: 100.0000 mg | Freq: Every day | INTRAMUSCULAR | Status: DC

## 2020-03-20 MED ORDER — SODIUM CHLORIDE 0.9 % IV SOLN
200.0000 mg | Freq: Once | INTRAVENOUS | Status: AC
  Administered 2020-03-20: 200 mg via INTRAVENOUS
  Filled 2020-03-20: qty 200

## 2020-03-20 MED ORDER — DEXAMETHASONE SODIUM PHOSPHATE 10 MG/ML IJ SOLN
6.0000 mg | Freq: Every day | INTRAMUSCULAR | Status: DC
  Administered 2020-03-20 – 2020-03-23 (×4): 6 mg via INTRAVENOUS
  Filled 2020-03-20 (×4): qty 1

## 2020-03-20 NOTE — TOC Progression Note (Addendum)
Transition of Care [facility]) - Progression Note    Patient Details  Name: Brian Mclaughlin MRN: [id number] Date of Birth: 1946/02/17  Transition [facility]) CM/SW Contact  Darleene Cleaver, [place] Phone Number: 03/20/2020, 10:55 AM  Clinical Narrative:     CSW received a phone call from SNF saying that patient's [employer] had ended on 03/16/20. Spoke to patient's daughter [name] and she did not know if patient's had changed his insurance.  CSW spoke to patient's friend Helmut Muster, she did not know if patient had changed his insurance either.  Patient is confused so he is unable to tell CSW at this time.  CSW to continue to investigate and try to find patient's insurance confirmation.  Per patient's daughter the plan is for patient to go to SNF for short term rehab, then she is working on trying to get patient placed in an ALF in [place] where daughter lives.   Expected Discharge Plan: Skilled Nursing Facility Barriers to Discharge: Insurance Authorization  Expected Discharge Plan and Services Expected Discharge Plan: Skilled Nursing Facility                                               Social Determinants of Health (SDOH) Interventions    Readmission Risk Interventions Readmission Risk Prevention Plan 07/29/2019  Transportation Screening Complete  PCP or Specialist Appt within 5-7 Days Patient refused  Home Care Screening Patient refused  Medication Review (RN CM) Referral to Pharmacy  Some recent data might be hidden

## 2020-03-20 NOTE — Progress Notes (Signed)
ANTICOAGULATION CONSULT NOTE - Initial Consult  Pharmacy Consult for Heparin Indication: rule out pulmonary embolus  Allergies  Allergen Reactions  . Codeine Rash  . Penicillins Rash    Patient Measurements: Height: 6' (182.9 cm) Weight: 93 kg (205 lb 0.4 oz) IBW/kg (Calculated) : 77.6 HEPARIN DW (KG): 93   Vital Signs: Temp: 98.2 F (36.8 C) (01/04 1800) Temp Source: Oral (01/04 1800) BP: 131/86 (01/04 1800) Pulse Rate: 84 (01/04 1800)  Labs: Recent Labs    03/20/20 1304  HGB 14.4  HCT 43.2  PLT 120*  CREATININE 1.33*    Estimated Creatinine Clearance: 52.7 mL/min (A) (by C-G formula based on SCr of 1.33 mg/dL (H)).   Medical History: Past Medical History:  Diagnosis Date  . Abnormality of gait 06/14/2014  . Acute CVA (cerebrovascular accident) (HCC) 05/24/2014  . Acute kidney failure, unspecified (HCC)   . Acute, but ill-defined, cerebrovascular disease   . Alcohol abuse   . Altered mental status   . Atrial fibrillation (HCC)   . Chronic systolic CHF (congestive heart failure) (HCC)   . CVA (cerebral infarction)   . ETOH abuse   . Hyperlipidemia   . Hypertension   . Nonspecific elevation of levels of transaminase or lactic acid dehydrogenase (LDH)   . Rhabdomyolysis   . Tobacco abuse   . Tobacco use disorder     Medications:  Infusions:  . azithromycin Stopped (03/20/20 1744)  . cefTRIAXone (ROCEPHIN)  IV Stopped (03/20/20 1744)  . lactated ringers 75 mL/hr at 03/20/20 1552  . [START ON 03/21/2020] remdesivir 100 mg in NS 100 mL     Not on anticoagulant PTA  Assessment: 75 yo M waiting SNF placement in ED since 12/31 - now COVID+ with elevated DDimer of 19.37.  Suspect PE- CTa chest pending.  Pharmacy consulted to start heparin until results return.   Pt has been receiving Lovenox 40mg  daily for VTE px since admission.  Baseline CBC: Hg WNL, Pltc low/stable at 120.    Goal of Therapy:  Heparin level 0.3-0.7 units/ml Monitor platelets by  anticoagulation protocol: Yes   Plan:  Give 2500 units bolus x 1 Start heparin infusion at 1400 units/hr Check anti-Xa level in 8 hours and daily while on heparin Continue to monitor H&H and platelets  F/U Chest CT results  PharmD, BCPS 03/20/2020,6:10 PM

## 2020-03-20 NOTE — ED Provider Notes (Addendum)
Pt holding for SNF placement due to failure to thrive.  Pt not eating or drinking well.  Pt coughing on evaluation.  Tech reports pt had a hard time eating this am. Pt choking when eating.  She ask about pureed diet.  Pt coughing during my evaluation  PE wd ill appearing.  Lungs coughing during exam. Rhonchi,  Heart RRR/  I will repeat labs, chest xray and covid on pt. I am concerned pt may have aspirated.  I spoke to Rn who had pt yesterday she reports she feed pt yesterday, he had difficulty eating but did not think he aspirated.  Pt's chest xray returned and is normal.  Covid is positive. Pt was negative on 12/31.  Pt's symptoms may be due to covid however I am still concerned about aspiration.   I spoke to Hospitalist who will admit Osie Cheeks 03/20/20 1055    [address], [place] 03/20/20 1356    Osie Cheeks 03/20/20 1408    Bethann Berkshire, MD 03/22/20 1027

## 2020-03-20 NOTE — ED Notes (Signed)
Pt able to follow command to place thermometer under tongue. Did not follow commands however, when asked to wiggle toes or squeeze writer's hand.

## 2020-03-20 NOTE — H&P (Signed)
History and Physical        Hospital Admission Note Date: 03/20/2020  Patient name: Brian Mclaughlin Medical record number: [id number] Date of birth: 1945-10-13 Age: 75 y.o. Gender: male  PCP: Patient, No Pcp Per  Patient coming from: Home Lives with: Alone   Chief Complaint    Chief Complaint  Patient presents with  . Decreased appetite  . Fatigue      HPI:   This is a 75 year old male who has not been vaccinated against COVID-19 with a past medical history of hypertension, atrial fibrillation not on anticoagulation, chronic systolic heart failure, CKD 3b, alcohol abuse, CVA who presented to the ED initially on 03/16/2020 after feeling dizzy and falling onto his left hip.  Had not had any alcohol for 3 days prior.  While the patient was in the ED he underwent full body imaging which did not show any acute fracture but did show a wedge compression fracture of L1.  The ED providers felt that he was not safe to be discharged home and kept him in the ED hallway for observation pending safe discharge to SNF for his failure to thrive symptoms.  On 03/20/2020, patient was noted to be coughing persistently.  On signout, nurse noticed that the patient was coughing while he was eating over the past 24 hours and so there was concern for aspiration.  A chest x-ray was obtained which showed mild atelectasis otherwise unremarkable.  He tested positive for COVID-19 prompting hospitalist admission.  Soon after receiving signout I was notified that the patient had a change in mental status with rapid breathing.  I presented to the bedside and the patient was encephalopathic but otherwise tolerating room air and in no acute distress.  Stat labs were ordered.  I discussed these findings with the patient's daughter over the phone who lives in [place] and has been working on getting the patient set up for an  ALF in [place].  She states that he drinks regularly, several times a week and does have slurred speech at baseline due to a stroke but is typically oriented x3.  ED Course: Afebrile, tachypneic,  hemodynamically stable, on room air. Notable Labs: VBG unremarkable, sodium 134, K4.6, BUN 32, creatinine 1.33, AST 46, ALT 18, T bili 1.6, WBC 5.7, Hb 14, platelets 120, COVID-19 positive, BNP 268, LDH 222, ferritin 389, CRP 12.8, procalcitonin 0.11, D-dimer 19, fibrinogen 374. Notable Imaging: CXR with atelectasis.   Vitals:   03/20/20 1730 03/20/20 1800  BP: 123/86 131/86  Pulse: 63 84  Resp: (!) 24 (!) 24  Temp:  98.2 F (36.8 C)  SpO2: 95% 91%     Review of Systems:  Review of Systems  Unable to perform ROS: Mental status change    Medical/Social/Family History   Past Medical History: Past Medical History:  Diagnosis Date  . Abnormality of gait 06/14/2014  . Acute CVA (cerebrovascular accident) ([place]) 05/24/2014  . Acute kidney failure, unspecified ([place])   . Acute, but ill-defined, cerebrovascular disease   . Alcohol abuse   . Altered mental status   . Atrial fibrillation ([place])   . Chronic systolic CHF (congestive heart failure) ([place])   . CVA (cerebral infarction)   .  ETOH abuse   . Hyperlipidemia   . Hypertension   . Nonspecific elevation of levels of transaminase or lactic acid dehydrogenase (LDH)   . Rhabdomyolysis   . Tobacco abuse   . Tobacco use disorder     Past Surgical History:  Procedure Laterality Date  . BACK SURGERY  10 years ago   "slipped disc" repair  . EYE MUSCLE SURGERY     as a teenager "tighten his muscles"    Medications: Prior to Admission medications   Medication Sig Start Date End Date Taking? Authorizing Provider  atorvastatin (LIPITOR) 20 MG tablet Take 1 tablet (20 mg total) by mouth at bedtime. Patient not taking: No sig reported 03/12/18   Rai, Delene Ruffini, MD  carvedilol (COREG) 3.125 MG tablet Take 1 tablet (3.125 mg total) by mouth 2 (two)  times daily with a meal. Patient not taking: No sig reported 03/12/18   Rai, Delene Ruffini, MD  thiamine 100 MG tablet Take 1 tablet (100 mg total) by mouth daily. Patient not taking: No sig reported 03/12/18   Cathren Harsh, MD    Allergies:   Allergies  Allergen Reactions  . Codeine Rash  . Penicillins Rash    Social History:  reports that he has quit smoking. His smoking use included cigarettes. He has a 22.50 pack-year smoking history. He has never used smokeless tobacco. He reports current alcohol use. He reports that he does not use drugs.  Family History: Family History  Problem Relation Age of Onset  . Cancer Mother   . Cancer Father   . Multiple sclerosis Daughter      Objective   Physical Exam: Blood pressure 131/86, pulse 84, temperature 98.2 F (36.8 C), temperature source Oral, resp. rate (!) 24, height 6' (1.829 m), weight 93 kg, SpO2 91 %.  Physical Exam Vitals and nursing note reviewed. Exam conducted with a chaperone present.  Constitutional:      General: He is not in acute distress.    Appearance: He is ill-appearing.     Comments: Somnolent Somewhat responds to commands  HENT:     Head: Normocephalic and atraumatic.     Mouth/Throat:     Mouth: Mucous membranes are dry.  Eyes:     Pupils: Pupils are equal, round, and reactive to light.  Cardiovascular:     Rate and Rhythm: Normal rate. Rhythm irregular.  Pulmonary:     Effort: No respiratory distress.     Breath sounds: No wheezing.  Abdominal:     General: Abdomen is flat. There is no distension.  Musculoskeletal:        General: No swelling or tenderness.     Right lower leg: No edema.     Left lower leg: No edema.  Skin:    General: Skin is dry.  Neurological:     Mental Status: He is disoriented.     LABS on Admission: I have personally reviewed all the labs and imaging below    Basic Metabolic Panel: Recent Labs  Lab 03/15/20 2030 03/20/20 1304 03/20/20 1432  NA 138 134*   --   K 4.2 4.6  --   CL 100 102  --   CO2 24 23  --   GLUCOSE 99 121*  --   BUN 39* 32*  --   CREATININE 2.14* 1.33*  --   CALCIUM 9.7 8.7*  --   MG  --   --  1.8  PHOS  --   --  2.7   Liver Function Tests: Recent Labs  Lab 03/15/20 2030 03/20/20 1304  AST 19 46*  ALT 10 18  ALKPHOS 63 60  BILITOT 1.8* 1.6*  PROT 8.3* 6.9  ALBUMIN 4.0 3.2*   No results for input(s): LIPASE, AMYLASE in the last 168 hours. Recent Labs  Lab 03/20/20 1432  AMMONIA 26   CBC: Recent Labs  Lab 03/15/20 2030 03/20/20 1304  WBC 9.1 5.7  NEUTROABS  --  4.2  HGB 16.5 14.4  HCT 50.0 43.2  MCV 98.4 98.6  PLT 118* 120*   Cardiac Enzymes: Recent Labs  Lab 03/15/20 2030  CKTOTAL 371   BNP: Invalid input(s): POCBNP CBG: No results for input(s): GLUCAP in the last 168 hours.  Radiological Exams on Admission:  DG Chest 1 View  Result Date: 03/20/2020 CLINICAL DATA:  Failure to thrive.  Coughing. EXAM: CHEST  1 VIEW COMPARISON:  None. FINDINGS: Cardiomegaly. Chronic aortic atherosclerotic calcification. Right lung is clear. Question mild atelectasis at the left lung base. Evidence of heart failure or effusion. IMPRESSION: Question mild atelectasis at the left lung base. Cardiomegaly. Aortic atherosclerosis. Electronically Signed   By: Paulina Fusi M.D.   On: 03/20/2020 11:11      EKG: atrial fibrillation, rate 75, occasional PVC noted, unifocal   A & P   Principal Problem:   COVID-19 virus infection Active Problems:   HTN (hypertension)   Alcohol abuse   Chronic systolic CHF (congestive heart failure) (HCC)   Chronic atrial fibrillation (HCC)   Chronic kidney disease, stage 3b (HCC)   Mixed hyperlipidemia   Acute encephalopathy   Fall   Failure to thrive in adult   1. COVID-19 a. Currently tolerating room air but at significant risk for decline b. CRP 12.8-hold on baricitinib given concern for aspiration pneumonia c. D-dimer 19-start heparin drip empirically and check CTA  chest d. Start remdesivir e. Start steroids as O2 sat has been slowly declining over the day, currently 91% on room air f. Gentle IV hydration g. As needed inhalers and antitussives as tolerated  2. Concern for developing aspiration pneumonia a. Afebrile and without leukocytosis but coughing while he is eating over the past day with acute change b. Start ceftriaxone and azithromycin  3. Acute encephalopathy of unclear etiology a. VBG, ammonia, sodium level, CT head a few days ago all unremarkable b. Possibly secondary to COVID-19 vs. lack of sleep in the ED vs. Less likely Alcohol withdrawal c. Hold sedating medications d. Continue to monitor  4. Chronic systolic heart failure a. Does not appear to be in acute exacerbation b. BNP chronically elevated c. Gentle IV hydration d. Continue to monitor daily weights and intake/output  5. Hypertension a. As needed Lopressor  6. Atrial fibrillation a. Chronically off anticoagulation b. As needed Lopressor c. Heparin drip as above  7. CKD 3b a. Creatinine 1.33, below baseline 1.6  8. Hyperlipidemia a. Holding statin while n.p.o.  9. Failure to thrive  Fall a. Guarded prognosis given above b. May need palliative care if no improvement in the next couple days    DVT prophylaxis: Heparin   Code Status: Full Code  Diet: N.p.o. Family Communication: Admission, patients condition and plan of care including tests being ordered have been discussed with the patient who indicates understanding and agrees with the plan and Code Status. Patient's daughter was updated  Disposition Plan: The appropriate patient status for this patient is INPATIENT. Inpatient status is judged to be reasonable and necessary in order  to provide the required intensity of service to ensure the patient's safety. The patient's presenting symptoms, physical exam findings, and initial radiographic and laboratory data in the context of their chronic comorbidities is  felt to place them at high risk for further clinical deterioration. Furthermore, it is not anticipated that the patient will be medically stable for discharge from the hospital within 2 midnights of admission. The following factors support the patient status of inpatient.   " The patient's presenting symptoms include cough. " The worrisome physical exam findings include altered mental status, cough. " The initial radiographic and laboratory data are worrisome because of COVID-19, elevated D-dimer. " The chronic co-morbidities include A. fib, alcohol use.   * I certify that at the point of admission it is my clinical judgment that the patient will require inpatient hospital care spanning beyond 2 midnights from the point of admission due to high intensity of service, high risk for further deterioration and high frequency of surveillance required.*   Status is: Inpatient  Remains inpatient appropriate because:Altered mental status, Ongoing diagnostic testing needed not appropriate for outpatient work up, Unsafe d/c plan, IV treatments appropriate due to intensity of illness or inability to take PO and Inpatient level of care appropriate due to severity of illness   Dispo: The patient is from: Home              Anticipated d/c is to: TBD              Anticipated d/c date is: > 3 days              Patient currently is not medically stable to d/c.         The medical decision making on this patient was of high complexity and the patient is at high risk for clinical deterioration, therefore this is a level 3 admission.  Consultants  . None  Procedures  . None  Time Spent on Admission: 90 minutes    Jae Dire, DO [facility]  03/20/2020, 6:28 PM

## 2020-03-21 ENCOUNTER — Inpatient Hospital Stay (HOSPITAL_COMMUNITY): Payer: Medicare HMO

## 2020-03-21 DIAGNOSIS — N1832 Chronic kidney disease, stage 3b: Secondary | ICD-10-CM | POA: Diagnosis not present

## 2020-03-21 DIAGNOSIS — I2699 Other pulmonary embolism without acute cor pulmonale: Secondary | ICD-10-CM | POA: Diagnosis not present

## 2020-03-21 DIAGNOSIS — F1010 Alcohol abuse, uncomplicated: Secondary | ICD-10-CM | POA: Diagnosis not present

## 2020-03-21 DIAGNOSIS — I5022 Chronic systolic (congestive) heart failure: Secondary | ICD-10-CM | POA: Diagnosis not present

## 2020-03-21 DIAGNOSIS — G9340 Encephalopathy, unspecified: Secondary | ICD-10-CM | POA: Diagnosis not present

## 2020-03-21 DIAGNOSIS — I482 Chronic atrial fibrillation, unspecified: Secondary | ICD-10-CM | POA: Diagnosis not present

## 2020-03-21 LAB — COMPREHENSIVE METABOLIC PANEL WITH GFR
ALT: 18 U/L (ref 0–44)
AST: 48 U/L — ABNORMAL HIGH (ref 15–41)
Albumin: 2.8 g/dL — ABNORMAL LOW (ref 3.5–5.0)
Alkaline Phosphatase: 57 U/L (ref 38–126)
Anion gap: 11 (ref 5–15)
BUN: 33 mg/dL — ABNORMAL HIGH (ref 8–23)
CO2: 20 mmol/L — ABNORMAL LOW (ref 22–32)
Calcium: 8.3 mg/dL — ABNORMAL LOW (ref 8.9–10.3)
Chloride: 103 mmol/L (ref 98–111)
Creatinine, Ser: 1.5 mg/dL — ABNORMAL HIGH (ref 0.61–1.24)
GFR, Estimated: 48 mL/min — ABNORMAL LOW
Glucose, Bld: 135 mg/dL — ABNORMAL HIGH (ref 70–99)
Potassium: 4.6 mmol/L (ref 3.5–5.1)
Sodium: 134 mmol/L — ABNORMAL LOW (ref 135–145)
Total Bilirubin: 0.9 mg/dL (ref 0.3–1.2)
Total Protein: 6.5 g/dL (ref 6.5–8.1)

## 2020-03-21 LAB — CBC WITH DIFFERENTIAL/PLATELET
Abs Immature Granulocytes: 0.03 10*3/uL (ref 0.00–0.07)
Basophils Absolute: 0 10*3/uL (ref 0.0–0.1)
Basophils Relative: 0 %
Eosinophils Absolute: 0 10*3/uL (ref 0.0–0.5)
Eosinophils Relative: 0 %
HCT: 41.8 % (ref 39.0–52.0)
Hemoglobin: 13.9 g/dL (ref 13.0–17.0)
Immature Granulocytes: 1 %
Lymphocytes Relative: 13 %
Lymphs Abs: 0.7 10*3/uL (ref 0.7–4.0)
MCH: 32.6 pg (ref 26.0–34.0)
MCHC: 33.3 g/dL (ref 30.0–36.0)
MCV: 98.1 fL (ref 80.0–100.0)
Monocytes Absolute: 0.2 10*3/uL (ref 0.1–1.0)
Monocytes Relative: 4 %
Neutro Abs: 4.5 10*3/uL (ref 1.7–7.7)
Neutrophils Relative %: 82 %
Platelets: 120 10*3/uL — ABNORMAL LOW (ref 150–400)
RBC: 4.26 MIL/uL (ref 4.22–5.81)
RDW: 13.6 % (ref 11.5–15.5)
WBC: 5.5 10*3/uL (ref 4.0–10.5)
nRBC: 0 % (ref 0.0–0.2)

## 2020-03-21 LAB — ECHOCARDIOGRAM LIMITED
Area-P 1/2: 2.87 cm2
Height: 72 in
P 1/2 time: 852 msec
S' Lateral: 4 cm
Weight: 3280.44 oz

## 2020-03-21 LAB — HEPARIN LEVEL (UNFRACTIONATED)
Heparin Unfractionated: 0.95 IU/mL — ABNORMAL HIGH (ref 0.30–0.70)
Heparin Unfractionated: 0.6 IU/mL (ref 0.30–0.70)

## 2020-03-21 LAB — MAGNESIUM: Magnesium: 2.2 mg/dL (ref 1.7–2.4)

## 2020-03-21 LAB — FERRITIN: Ferritin: 457 ng/mL — ABNORMAL HIGH (ref 24–336)

## 2020-03-21 LAB — C-REACTIVE PROTEIN: CRP: 11.7 mg/dL — ABNORMAL HIGH

## 2020-03-21 LAB — D-DIMER, QUANTITATIVE: D-Dimer, Quant: 17.66 ug{FEU}/mL — ABNORMAL HIGH (ref 0.00–0.50)

## 2020-03-21 MED ORDER — PERFLUTREN LIPID MICROSPHERE
1.0000 mL | INTRAVENOUS | Status: AC | PRN
Start: 2020-03-21 — End: 2020-03-21
  Administered 2020-03-21: 2 mL via INTRAVENOUS
  Filled 2020-03-21: qty 10

## 2020-03-21 MED ORDER — SODIUM CHLORIDE 0.9 % IV BOLUS
500.0000 mL | Freq: Once | INTRAVENOUS | Status: AC
  Administered 2020-03-21: 500 mL via INTRAVENOUS

## 2020-03-21 MED ORDER — SODIUM CHLORIDE 0.9 % IV SOLN
250.0000 mL | INTRAVENOUS | Status: DC
  Administered 2020-03-21: 250 mL via INTRAVENOUS

## 2020-03-21 MED ORDER — PHENYLEPHRINE HCL-NACL 10-0.9 MG/250ML-% IV SOLN
25.0000 ug/min | INTRAVENOUS | Status: DC
  Administered 2020-03-21: 25 ug/min via INTRAVENOUS
  Filled 2020-03-21 (×3): qty 250

## 2020-03-21 MED ORDER — HEPARIN (PORCINE) 25000 UT/250ML-% IV SOLN
1200.0000 [IU]/h | INTRAVENOUS | Status: DC
  Administered 2020-03-21 (×2): 1200 [IU]/h via INTRAVENOUS
  Filled 2020-03-21: qty 250

## 2020-03-21 NOTE — ED Notes (Addendum)
Patient transported to MRI accompanied by this nurse

## 2020-03-21 NOTE — Progress Notes (Signed)
ANTICOAGULATION CONSULT NOTE   Pharmacy Consult for Heparin Indication: rule out pulmonary embolus  Allergies  Allergen Reactions  . Codeine Rash  . Penicillins Rash    Patient Measurements: Height: 6' (182.9 cm) Weight: 93 kg (205 lb 0.4 oz) IBW/kg (Calculated) : 77.6 HEPARIN DW (KG): 93   Vital Signs: Temp: 98 F (36.7 C) (01/04 2358) Temp Source: Oral (01/04 2358) BP: 178/98 (01/05 0433) Pulse Rate: 78 (01/05 0435)  Labs: Recent Labs    03/20/20 1304 03/21/20 0437  HGB 14.4 13.9  HCT 43.2 41.8  PLT 120* 120*  HEPARINUNFRC  --  0.95*  CREATININE 1.33* 1.50*    Estimated Creatinine Clearance: 46.7 mL/min (A) (by C-G formula based on SCr of 1.5 mg/dL (H)).   Medical History: Past Medical History:  Diagnosis Date  . Abnormality of gait 06/14/2014  . Acute CVA (cerebrovascular accident) (HCC) 05/24/2014  . Acute kidney failure, unspecified (HCC)   . Acute, but ill-defined, cerebrovascular disease   . Alcohol abuse   . Altered mental status   . Atrial fibrillation (HCC)   . Chronic systolic CHF (congestive heart failure) (HCC)   . CVA (cerebral infarction)   . ETOH abuse   . Hyperlipidemia   . Hypertension   . Nonspecific elevation of levels of transaminase or lactic acid dehydrogenase (LDH)   . Rhabdomyolysis   . Tobacco abuse   . Tobacco use disorder     Medications:  Infusions:  . azithromycin Stopped (03/20/20 1744)  . cefTRIAXone (ROCEPHIN)  IV Stopped (03/20/20 1744)  . heparin 1,200 Units/hr (03/21/20 0543)  . remdesivir 100 mg in NS 100 mL     Not on anticoagulant PTA  Assessment: 75 yo M waiting SNF placement in ED since 12/31 - now COVID+ with elevated DDimer of 19.37.  Suspect PE- CTa chest pending.  Pharmacy consulted to start heparin until results return.   Pt has been receiving Lovenox 40mg  daily for VTE px since admission.  Baseline CBC: Hg WNL, Pltc low/stable at 120.    03/21/20  Heparin level = 0.95 (supratherapeutic) with  heparin gtt @ 1400 units/hr  Hgb 13.9  PLTC 120 K  No complications of therapy noted per RN  Goal of Therapy:  Heparin level 0.3-0.7 units/ml Monitor platelets by anticoagulation protocol: Yes   Plan:  Decrease heparin gtt to 1200 units/hr Check heparin level 8 hr after heparin rate decrease Check daily heparin level and CBC  F/U Chest CT results   05/19/20 PharmD 03/21/2020,6:00 AM

## 2020-03-21 NOTE — Progress Notes (Signed)
PROGRESS NOTE  Brian Mclaughlin  WUJ:[id number] DOB: 1945/05/18 DOA: 03/16/2020 PCP: Patient, No Pcp Per   Brief Narrative: Brian Mclaughlin is a 75 y.o. male with a history of AFib, chronic HFrEF, stage IIIb CKD, CVA w/dysarthria, alcohol abuse, and HTN who presented to the ED initially 03/16/2020 with dizziness and fall onto left hip at home. Comprehensive imaging at that time revealed only a wedge compression fracture at L1. He was kept in the ED awaiting SNF bed until he was noted to be coughing consistently, worse with per oral intake on 03/20/2020. Aspiration was suspected and SARS-CoV-2 PCR was positive (had been negative 12/31). CXR revealed mild left base atelectasis on background of cardiomegaly. CRP was found to be 12.8, though WBC remained wnl. PCT 0.11, d-dimer 19.24. Subsequent CTA chest evaluation was limited by artifact with suspicion for chronic PE in right pulmonary artery, otherwise inconclusive. Also noted was mild posterior LLL consolidation with associated atelectasis/infiltrate and very small effusion. Heparin was started in addition to remdesivir, ceftriaxone, azithromycin, and decadron. He remains without hypoxia.   Assessment & Plan: Principal Problem:   COVID-19 virus infection Active Problems:   HTN (hypertension)   Alcohol abuse   Chronic systolic CHF (congestive heart failure) (HCC)   Chronic atrial fibrillation (HCC)   Chronic kidney disease, stage 3b (HCC)   Mixed hyperlipidemia   Acute encephalopathy   Fall   Failure to thrive in adult  Acute encephalopathy: Multifactorial including delirium due to being in ED for several days. This far out, unlikely to be alcohol withdrawal and vital signs don't support that Dx. Covid-19 contributing, will r/o intracerebral event.   Chronic right pulmonary artery PE: CTA unable to exclude other clots as well.  - Continue anticoagulation. Heparin supratherapeutic, being managed by pharmacy.  - Echocardiogram  Fall at home, L1  compression fracture:  - SNF disposition planned once more stable for discharge. TOC following.   Chronic HFrEF: Not overloaded currently. BNP 268, chronically elevated dating to 2019. - Continue gentle IV hydration while NPO.  - Monitor I/O, weights  History of CVA, dysarthria, AMS:  - Check CT head in setting of anticoagulation initiation and abnormal neuro exam with unclear baseline - SLP evaluation - Holding statin while NPO.   Covid-19 infection: CRP significantly elevated. SpO2 nadir overnight was 89%.  - Continue remdesivir - Continue decadron - IS, FV, OOB, prone if able.   PAF, HTN:  - Continue prn metoprolol, currently rate-controlled.   Aspiration pneumonia:  - W/LLL consolidation and suspicion for aspiration, will continue abx. Note PCN allergy.  AST elevation: Likely related to covid.  - Monitor  Thrombocytopenia: Likely related to covid, though has been present since at least Oct 2021. Remains >100k.  - Continue anticoagulation as above. Monitor Plt count. Consider further work up in future.  Stage IIIb CKD: Scr at/below baseline. - SCr relatively stable. Continue monitoring.   Failure to thrive: In setting of all other comorbidities, has poor prognosis regardless. Currently full code.  - Will engage family in code discussions and goals of care moving forward.   DVT prophylaxis: IV heparin Code Status: Full Family Communication: None at bedside, will attempt to call later for updates. Disposition Plan:  Status is: Inpatient  Remains inpatient appropriate because:Altered mental status, Ongoing diagnostic testing needed not appropriate for outpatient work up and Inpatient level of care appropriate due to severity of illness   Dispo: The patient is from: Home  Anticipated d/c is to: SNF              Anticipated d/c date is: > 3 days              Patient currently is not medically stable to d/c.  Consultants:   None  Procedures:    None  Antimicrobials:  Ceftriaxone, azithromycin, remdesivir   Subjective: Pt dysarthric and not meaningfully cooperative with exam. Denies pain anywhere and denies SOB.   Objective: Vitals:   03/21/20 0433 03/21/20 0435 03/21/20 0515 03/21/20 0704  BP: (!) 178/98  (!) 166/99 (!) 160/108  Pulse: (!) 49 78 84 75  Resp: 16 19 19 16   Temp:    98 F (36.7 C)  TempSrc:    Oral  SpO2: 94% 94% (!) 89% 93%  Weight:      Height:       No intake or output data in the 24 hours ending 03/21/20 1008 Filed Weights   03/16/20 2000  Weight: 93 kg    Gen: Chronically ill-appearing male in no distress Pulm: Non-labored breathing. Clear to auscultation bilaterally.  CV: Regular rate and rhythm. No murmur, rub, or gallop. No JVD, No pitting pedal edema. GI: Abdomen soft, non-tender, non-distended, with normoactive bowel sounds. No organomegaly or masses felt. Ext: Warm, no deformities Skin: LE varicosities. No ulcers on visualized skin Neuro: Alert, dysarthric and not cooperative with exam. Will not look to the right and does not move right side voluntarily while I was in room. Does not engage muscles on right arm when I lifted it.  Psych: Calm, but UTD.  Data Reviewed: I have personally reviewed following labs and imaging studies  CBC: Recent Labs  Lab 03/15/20 2030 03/20/20 1304 03/21/20 0437  WBC 9.1 5.7 5.5  NEUTROABS  --  4.2 4.5  HGB 16.5 14.4 13.9  HCT 50.0 43.2 41.8  MCV 98.4 98.6 98.1  PLT 118* 120* 416*   Basic Metabolic Panel: Recent Labs  Lab 03/15/20 2030 03/20/20 1304 03/20/20 1432 03/21/20 0437  NA 138 134*  --  134*  K 4.2 4.6  --  4.6  CL 100 102  --  103  CO2 24 23  --  20*  GLUCOSE 99 121*  --  135*  BUN 39* 32*  --  33*  CREATININE 2.14* 1.33*  --  1.50*  CALCIUM 9.7 8.7*  --  8.3*  MG  --   --  1.8 2.2  PHOS  --   --  2.7  --    GFR: Estimated Creatinine Clearance: 46.7 mL/min (A) (by C-G formula based on SCr of 1.5 mg/dL (H)). Liver  Function Tests: Recent Labs  Lab 03/15/20 2030 03/20/20 1304 03/21/20 0437  AST 19 46* 48*  ALT 10 18 18   ALKPHOS 63 60 57  BILITOT 1.8* 1.6* 0.9  PROT 8.3* 6.9 6.5  ALBUMIN 4.0 3.2* 2.8*   No results for input(s): LIPASE, AMYLASE in the last 168 hours. Recent Labs  Lab 03/20/20 1432  AMMONIA 26   Coagulation Profile: No results for input(s): INR, PROTIME in the last 168 hours. Cardiac Enzymes: Recent Labs  Lab 03/15/20 2030  CKTOTAL 371   BNP (last 3 results) No results for input(s): PROBNP in the last 8760 hours. HbA1C: No results for input(s): HGBA1C in the last 72 hours. CBG: No results for input(s): GLUCAP in the last 168 hours. Lipid Profile: No results for input(s): CHOL, HDL, LDLCALC, TRIG, CHOLHDL, LDLDIRECT in the last 72  hours. Thyroid Function Tests: No results for input(s): TSH, T4TOTAL, FREET4, T3FREE, THYROIDAB in the last 72 hours. Anemia Panel: Recent Labs    03/20/20 1432 03/21/20 0437  FERRITIN 389* 457*   Urine analysis:    Component Value Date/Time   COLORURINE YELLOW 03/16/2020 0300   APPEARANCEUR HAZY (A) 03/16/2020 0300   LABSPEC 1.030 03/16/2020 0300   PHURINE 5.0 03/16/2020 0300   GLUCOSEU NEGATIVE 03/16/2020 0300   HGBUR NEGATIVE 03/16/2020 0300   BILIRUBINUR NEGATIVE 03/16/2020 0300   KETONESUR NEGATIVE 03/16/2020 0300   PROTEINUR 100 (A) 03/16/2020 0300   UROBILINOGEN 0.2 05/24/2014 0548   NITRITE NEGATIVE 03/16/2020 0300   LEUKOCYTESUR TRACE (A) 03/16/2020 0300   Recent Results (from the past 240 hour(s))  SARS CORONAVIRUS 2 (TAT 6-24 HRS) Nasopharyngeal Nasopharyngeal Swab     Status: None   Collection Time: 03/16/20  4:32 AM   Specimen: Nasopharyngeal Swab  Result Value Ref Range Status   SARS Coronavirus 2 NEGATIVE NEGATIVE Final    Comment: (NOTE) SARS-CoV-2 target nucleic acids are NOT DETECTED.  The SARS-CoV-2 RNA is generally detectable in upper and lower respiratory specimens during the acute phase of  infection. Negative results do not preclude SARS-CoV-2 infection, do not rule out co-infections with other pathogens, and should not be used as the sole basis for treatment or other patient management decisions. Negative results must be combined with clinical observations, patient history, and epidemiological information. The expected result is Negative.  Fact Sheet for Patients: HairSlick.no  Fact Sheet for Healthcare Providers: quierodirigir.com  This test is not yet approved or cleared by the [place] FDA and  has been authorized for detection and/or diagnosis of SARS-CoV-2 by FDA under an Emergency Use Authorization (EUA). This EUA will remain  in effect (meaning this test can be used) for the duration of the COVID-19 declaration under Se ction 564(b)(1) of the Act, 21 U.S.C. section 360bbb-3(b)(1), unless the authorization is terminated or revoked sooner.  Performed at [facility], [address], [place], [place] [address]       Radiology Studies: DG Chest 1 View  Result Date: 03/20/2020 CLINICAL DATA:  Failure to thrive.  Coughing. EXAM: CHEST  1 VIEW COMPARISON:  None. FINDINGS: Cardiomegaly. Chronic aortic atherosclerotic calcification. Right lung is clear. Question mild atelectasis at the left lung base. Evidence of heart failure or effusion. IMPRESSION: Question mild atelectasis at the left lung base. Cardiomegaly. Aortic atherosclerosis. Electronically Signed   By: Paulina Fusi M.D.   On: 03/20/2020 11:11   CT ANGIO CHEST PE W OR WO CONTRAST  Result Date: 03/20/2020 CLINICAL DATA:  Shortness of breath.  COVID positive. EXAM: CT ANGIOGRAPHY CHEST WITH CONTRAST TECHNIQUE: Multidetector CT imaging of the chest was performed using the standard protocol during bolus administration of intravenous contrast. Multiplanar CT image reconstructions and MIPs were obtained to evaluate the vascular anatomy. CONTRAST:  69mL  OMNIPAQUE IOHEXOL 350 MG/ML SOLN COMPARISON:  February 02, 2012 FINDINGS: Cardiovascular: The segmental and subsegmental pulmonary arteries are limited in evaluation secondary to patient motion and suboptimal opacification with intravenous contrast. This is most prominent within the bilateral lower lobes. A thin linear focus of intraluminal low attenuation is seen within the distal aspect of the right pulmonary artery (axial CT images 139 through 143, CT series number 5). There is mild cardiomegaly with marked severity coronary artery calcification. No pericardial effusion. Mediastinum/Nodes: No enlarged mediastinal, hilar, or axillary lymph nodes. Thyroid gland, trachea, and esophagus demonstrate no significant findings. Lungs/Pleura: A  mild amount of consolidation is seen along the posterior aspect of the left lower lobe. A small amount of atelectasis and/or infiltrate is also seen along the lateral aspect of the left lower lobe. A very small left pleural effusion is noted. No pneumothorax is identified. Upper Abdomen: The visualized portion of the upper pole of the left kidney is atrophic in appearance. Musculoskeletal: Multilevel degenerative changes seen throughout the thoracic spine Review of the MIP images confirms the above findings. IMPRESSION: 1. Limited evaluation of the pulmonary arteries, as described above. Given the degree of visible artifact, acute pulmonary embolism cannot be excluded. Correlation with follow-up chest CTA is recommended. 2. 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 3. Mild posterior left lower lobe consolidation with additional small areas of left lower lobe atelectasis and/or infiltrate. 4. Very small left pleural effusion. Electronically Signed   By: Aram Candela M.D.   On: 03/20/2020 19:27    Scheduled Meds: . dexamethasone (DECADRON) injection  6 mg Intravenous Daily  . metoprolol tartrate  2.5 mg  Intravenous Q6H  . sodium chloride flush  3 mL Intravenous Q12H   Continuous Infusions: . azithromycin Stopped (03/20/20 1744)  . cefTRIAXone (ROCEPHIN)  IV Stopped (03/20/20 1744)  . heparin 1,200 Units/hr (03/21/20 0543)  . remdesivir 100 mg in NS 100 mL 100 mg (03/21/20 0938)     LOS: 1 day   Time spent: 35 minutes.  Tyrone Nine, MD [facility] [email or web address] 03/21/2020, 10:08 AM

## 2020-03-21 NOTE — ED Notes (Addendum)
Secretary notified to page hospitalist about hypotension

## 2020-03-21 NOTE — Progress Notes (Incomplete)
    BRIEF OVERNIGHT PROGRESS REPORT   SUBJECTIVE:  OBJECTIVE:  ASSESSMENT & PLAN:  75 year old male with PMH significant for CVA, HTN, chronic atrial fibrillation not on anticoagulation, CHF, CKD stage IIIb, EtOH abuse   Webb Silversmith, DNP, CCRN, FNP-C [facility] Nurse Practitioner Between 7pm to [place] - Pager [phone]

## 2020-03-21 NOTE — Progress Notes (Signed)
Pharmacy Brief Note - Anticoagulation Follow Up:  Pt is a 75 year old male on heparin drip for possible PE. See note by Terrilee Files, PharmD.  Today, 03/21/20  HL = 0.60 is therapeutic on heparin infusion of 1200 units/hr  Confirmed with RN that heparin infusing at correct rate. No interruptions. No signs of bleeding   Goal: HL 0.3 - 0.7  Plan:   Continue heparin infusion at current rate of 1200 units/hr  Check confirmatory 8 hour HL  CBC daily with AM labs  Cindi Carbon, PharmD 03/21/20 4:00 PM

## 2020-03-21 NOTE — Progress Notes (Addendum)
    BRIEF OVERNIGHT PROGRESS REPORT   SUBJECTIVE: Patient with persistent hypotension with BP as low as 60s systolic. Patient also noted with acute encephalopathy dysarthric and unable to follow commands. MRI of the brain demonstrated large left MCA stroke with petechial hemorrhage.  OBJECTIVE:On bedside assessment yes, he was afebrile with blood pressure 74/61 mm Hg and pulse rate 54 beats/min.   Focused Neurological Assessment: Patient awake, alert. Dysarthric. Expressive and receptive aphasia. Unable to follow commands consistently. Extraocular movements intact. Right homonomyous hemianopia. Right facial weakness. Right upper extremity with 3/5 strength. Right lower extremity with 3/5 strength. Moves left arm and leg with some difficulty, strength normal. Left arm orbits right. Plantars down on the right, up on  the left. Decreased sensation on the right side of the body  ASSESSMENT & PLAN:  75 year old male with PMH significant for CVA, HTN, chronic atrial fibrillation not on anticoagulation, CHF, CKD stage IIIb, EtOH abuse and compression fracture at L1 admitted on 03/18/20 with Covid infection, aspiration pneumonia and acute encephalopathy now with new finding of left MCA stroke with significant dysrhythmia and altered mental status. .   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 Continue prophylactic therapy- ASA 81mg  PO daily -NPO until RN stroke swallow screen -Telemetry monitoring - Frequent neuro checks - Neurology consult placed. Discussed new stroke finding with on-call neurologist Dr. who recommends transfer patient to [facility] for further stroke  work-up and management. Recommends holding anticoagulation in the setting of acute stroke with petechial hemorrhage at risk for hemorrhagic transformation.  Hypotension -unclear etiology patient does not appear septic however cannot rule out underlying infectious process. Currently on antibiotics for presumed aspiration pneumonia Hx: Hypertension -Hold home Coreg -Start low-dose phenylephrine to keep MAP greater than [facility] -Follow blood cultures and labs, will check procalcitonin, lactic acid -Monitor fever curve -Consult placed to PCCM, discussed with on-call intensivist Dr. , request consult at arrival    Wynona Neat, DNP, CCRN, FNP-C [facility] Nurse Practitioner Between 7pm to 7am - Pager - [phone]

## 2020-03-21 NOTE — ED Notes (Signed)
ED provider Houng at bedside to make aware of patients status in department.

## 2020-03-21 NOTE — ED Notes (Signed)
Speaking with floor coverage about patients blood pressure decreasing again.

## 2020-03-21 NOTE — Plan of Care (Signed)
Notified Pt's BP is slowly trending down and Hypotensive now down to 70's Persistently altered COVID positive Give IV fluid bolus and will need to reassess Dc metoprolol Advised to contact floor coverage to make them aware of the case.  Pt is Full CODE If BP does not improve with IV fluids will need PCCM consult and ICU admission.

## 2020-03-21 NOTE — ED Notes (Signed)
Unable to speak with floor coverage, spoke with Dr. Adela Glimpse about patients decreasing blood pressure. 500 cc bolus ordered.

## 2020-03-21 NOTE — Progress Notes (Signed)
  Echocardiogram 2D Echocardiogram has been performed.  Brian Mclaughlin 03/21/2020, 3:16 PM

## 2020-03-21 NOTE — Progress Notes (Signed)
ADDENDUM: CT head returned with new abnormalities which were reviewed with neurology, Dr. [facility] who also reviewed with radiology. There is concern for petechial hemorrhage vs. laminar necrosis vs. artifact. With abnormalities which would correlate to language/speech changes, we will get stat MRI and stop heparin for now.   Hazeline Junker, MD 03/21/2020 7:41 PM

## 2020-03-22 ENCOUNTER — Inpatient Hospital Stay (HOSPITAL_COMMUNITY): Payer: Medicare HMO

## 2020-03-22 ENCOUNTER — Other Ambulatory Visit (HOSPITAL_COMMUNITY): Payer: Medicare HMO

## 2020-03-22 DIAGNOSIS — I482 Chronic atrial fibrillation, unspecified: Secondary | ICD-10-CM | POA: Diagnosis not present

## 2020-03-22 DIAGNOSIS — I63512 Cerebral infarction due to unspecified occlusion or stenosis of left middle cerebral artery: Secondary | ICD-10-CM | POA: Diagnosis present

## 2020-03-22 DIAGNOSIS — G9340 Encephalopathy, unspecified: Secondary | ICD-10-CM | POA: Diagnosis not present

## 2020-03-22 DIAGNOSIS — I639 Cerebral infarction, unspecified: Secondary | ICD-10-CM

## 2020-03-22 DIAGNOSIS — U071 COVID-19: Principal | ICD-10-CM

## 2020-03-22 DIAGNOSIS — F1010 Alcohol abuse, uncomplicated: Secondary | ICD-10-CM | POA: Diagnosis not present

## 2020-03-22 DIAGNOSIS — N1832 Chronic kidney disease, stage 3b: Secondary | ICD-10-CM | POA: Diagnosis not present

## 2020-03-22 LAB — CBC WITH DIFFERENTIAL/PLATELET
Abs Immature Granulocytes: 0.06 10*3/uL (ref 0.00–0.07)
Basophils Absolute: 0 10*3/uL (ref 0.0–0.1)
Basophils Relative: 0 %
Eosinophils Absolute: 0 10*3/uL (ref 0.0–0.5)
Eosinophils Relative: 0 %
HCT: 33.8 % — ABNORMAL LOW (ref 39.0–52.0)
Hemoglobin: 11.2 g/dL — ABNORMAL LOW (ref 13.0–17.0)
Immature Granulocytes: 1 %
Lymphocytes Relative: 13 %
Lymphs Abs: 1.4 10*3/uL (ref 0.7–4.0)
MCH: 33 pg (ref 26.0–34.0)
MCHC: 33.1 g/dL (ref 30.0–36.0)
MCV: 99.7 fL (ref 80.0–100.0)
Monocytes Absolute: 1.2 10*3/uL — ABNORMAL HIGH (ref 0.1–1.0)
Monocytes Relative: 12 %
Neutro Abs: 8 10*3/uL — ABNORMAL HIGH (ref 1.7–7.7)
Neutrophils Relative %: 74 %
Platelets: 130 10*3/uL — ABNORMAL LOW (ref 150–400)
RBC: 3.39 MIL/uL — ABNORMAL LOW (ref 4.22–5.81)
RDW: 13.6 % (ref 11.5–15.5)
WBC: 10.7 10*3/uL — ABNORMAL HIGH (ref 4.0–10.5)
nRBC: 0 % (ref 0.0–0.2)

## 2020-03-22 LAB — COMPREHENSIVE METABOLIC PANEL
ALT: 25 U/L (ref 0–44)
AST: 73 U/L — ABNORMAL HIGH (ref 15–41)
Albumin: 2.9 g/dL — ABNORMAL LOW (ref 3.5–5.0)
Alkaline Phosphatase: 50 U/L (ref 38–126)
Anion gap: 11 (ref 5–15)
BUN: 53 mg/dL — ABNORMAL HIGH (ref 8–23)
CO2: 20 mmol/L — ABNORMAL LOW (ref 22–32)
Calcium: 7.8 mg/dL — ABNORMAL LOW (ref 8.9–10.3)
Chloride: 104 mmol/L (ref 98–111)
Creatinine, Ser: 2.22 mg/dL — ABNORMAL HIGH (ref 0.61–1.24)
GFR, Estimated: 30 mL/min — ABNORMAL LOW (ref >60)
Glucose, Bld: 121 mg/dL — ABNORMAL HIGH (ref 70–99)
Potassium: 4.3 mmol/L (ref 3.5–5.1)
Sodium: 135 mmol/L (ref 135–145)
Total Bilirubin: 0.5 mg/dL (ref 0.3–1.2)
Total Protein: 6.1 g/dL — ABNORMAL LOW (ref 6.5–8.1)

## 2020-03-22 LAB — D-DIMER, QUANTITATIVE: D-Dimer, Quant: 12.53 ug/mL-FEU — ABNORMAL HIGH (ref 0.00–0.50)

## 2020-03-22 LAB — MAGNESIUM: Magnesium: 2.4 mg/dL (ref 1.7–2.4)

## 2020-03-22 LAB — C-REACTIVE PROTEIN: CRP: 7.4 mg/dL — ABNORMAL HIGH (ref <1.0)

## 2020-03-22 LAB — FERRITIN: Ferritin: 1697 ng/mL — ABNORMAL HIGH (ref 24–336)

## 2020-03-22 MED ORDER — IOHEXOL 350 MG/ML SOLN
100.0000 mL | Freq: Once | INTRAVENOUS | Status: AC | PRN
  Administered 2020-03-22: 80 mL via INTRAVENOUS

## 2020-03-22 MED ORDER — LACTATED RINGERS IV SOLN: INTRAVENOUS | Status: DC

## 2020-03-22 MED ORDER — ASPIRIN 300 MG RE SUPP
300.0000 mg | Freq: Every day | RECTAL | Status: DC
  Administered 2020-03-22 – 2020-03-23 (×2): 300 mg via RECTAL
  Filled 2020-03-22 (×2): qty 1

## 2020-03-22 MED ORDER — STROKE: EARLY STAGES OF RECOVERY BOOK
Freq: Once | Status: DC
  Filled 2020-03-22: qty 1

## 2020-03-22 NOTE — ED Notes (Signed)
Report called to [facility]

## 2020-03-22 NOTE — ED Notes (Signed)
Mittens placed on patient for safety to prevent continuous removal of monitors.

## 2020-03-22 NOTE — Progress Notes (Addendum)
  Patient for transfer to [facility] pending bed availability this morning. He is currently on the waiting list and should be placed as soon as bed opens up on stepdown unit. He is currently stable and does not require ICU bed.  Webb Silversmith, BSN, MSN, DNP, [employer]  [facility] Nurse Practitioner  [facility]

## 2020-03-22 NOTE — Progress Notes (Signed)
PT Cancellation Note  Patient Details Name: Brian Mclaughlin MRN: [id number] DOB: 1945/06/02   Cancelled Treatment:    Reason Eval/Treat Not Completed: Medical issues which prohibited therapy, now DNR, Palliative medicine consulted. Will follow for GOC.   Rada Hay 03/22/2020, 11:02 AM  Blanchard Kelch PT Acute Rehabilitation Services Pager [phone] Office [phone]

## 2020-03-22 NOTE — Progress Notes (Signed)
SLP Cancellation Note  Patient Details Name: Brian Mclaughlin MRN: [id number] DOB: 1946/01/14   Cancelled treatment:       Reason Eval/Treat Not Completed: Medical issues which prohibited therapy.  Palliative consult/GOC pending. Will follow along to determine if assessment remains a priority.  [name] L. [name], MA CCC/SLP Acute Rehabilitation Services Office number [phone] Pager [phone]    [name] [name] 03/22/2020, 1:46 PM

## 2020-03-22 NOTE — Consult Note (Signed)
Neurology Consultation Reason for Consult: Acute ischemic stroke Referring Physician: Aubery Lapping  CC: Aphasia  History is obtained from: Chart review  HPI: Brian Mclaughlin is a 75 y.o. male with a history of previous stroke who presented to the hospital on 12/31 for increasing falls and was awaiting placement in the emergency department when he developed a new cough and was found to have Covid.  1/5 he was noted to be more confused and therefore head CT was performed which demonstrated some concern for acute ischemic stroke.  An MRI was therefore performed which confirms a fairly large left temporal stroke.   LKW: Unclear, but likely sometime on 1/5 tpa given?: no, on heparin drip    ROS: Unable to obtain due to altered mental status.   Past Medical History:  Diagnosis Date  . Abnormality of gait 06/14/2014  . Acute CVA (cerebrovascular accident) (HCC) 05/24/2014  . Acute kidney failure, unspecified (HCC)   . Acute, but ill-defined, cerebrovascular disease   . Alcohol abuse   . Altered mental status   . Atrial fibrillation (HCC)   . Chronic systolic CHF (congestive heart failure) (HCC)   . CVA (cerebral infarction)   . ETOH abuse   . Hyperlipidemia   . Hypertension   . Nonspecific elevation of levels of transaminase or lactic acid dehydrogenase (LDH)   . Rhabdomyolysis   . Tobacco abuse   . Tobacco use disorder      Family History  Problem Relation Age of Onset  . Cancer Mother   . Cancer Father   . Multiple sclerosis Daughter      Social History:  reports that he has quit smoking. His smoking use included cigarettes. He has a 22.50 pack-year smoking history. He has never used smokeless tobacco. He reports current alcohol use. He reports that he does not use drugs.   Exam: Current vital signs: BP 104/71   Pulse 66   Temp (!) 96.8 F (36 C) (Rectal)   Resp 14   Ht 6' (1.829 m)   Wt 93 kg   SpO2 98%   BMI 27.81 kg/m  Vital signs in last 24 hours: Temp:  [96.8 F  (36 C)-98 F (36.7 C)] 96.8 F (36 C) (01/06 0109) Pulse Rate:  [30-104] 66 (01/06 0540) Resp:  [9-32] 14 (01/06 0540) BP: (61-160)/(44-108) 104/71 (01/06 0540) SpO2:  [90 %-100 %] 98 % (01/06 0540)   Physical Exam  Constitutional: Appears well-developed and well-nourished.  Psych: Affect appropriate to situation Eyes: No scleral injection HENT: No OP obstrucion MSK: no joint deformities.  Cardiovascular: Normal rate and regular rhythm.  Respiratory: Effort normal, non-labored breathing GI: Soft.  No distension. There is no tenderness.  Skin: WDI  Neuro: Mental Status: Patient is densely aphasic, he does follow some simple commands, but speech is nonsensical Cranial Nerves: II: Right hemianopia pupils are equal, round, and reactive to light.   III,IV, VI: He does not cross midline to the right V: VII: Blinks to eyelid stimulation bilaterally Motor: He has a severe right hemiparesis, though he does flex his right leg to noxious stimulation  sensory: He does not respond as much to stimulation on the right his left  Cerebellar: Does not perform  I have reviewed labs in epic and the results pertinent to this consultation are: Creatinine 1.3  I have reviewed the images obtained: MRI brain-moderate to large left MCA infarct  Impression: 75 year old male with fairly large stroke in the setting of Covid and atrial fibrillation.  Given the concern for petechial hemorrhage on MRI, as well as the size of the stroke, anticoagulation has been held.  I do think that antiplatelet therapy is still reasonable, however.  I suspect that he does not have significant difficulty  because of the stroke given his age and severity of his deficits.  Recommendations: - HgbA1c, fasting lipid panel -CTA head neck - Frequent neuro checks - Echocardiogram - Prophylactic therapy-Antiplatelet med: Aspirin - dose 325mg  PO or 300mg  PR - Risk factor modification - Telemetry monitoring - PT consult, OT  consult, Speech consult - Stroke team to follow    , MD [facility] [phone]  If 7pm- 7am, please page neurology on call as listed in AMION.

## 2020-03-22 NOTE — Progress Notes (Signed)
PROGRESS NOTE  Brian Mclaughlin  OEV:[id number] DOB: [birth year] DOA: 03/16/2020 PCP: Patient, No Pcp Per   Brief Narrative: Brian Mclaughlin is a 75 y.o. male with a history of AFib, chronic HFrEF, stage IIIb CKD, CVA w/dysarthria, alcohol abuse, and HTN who presented to the ED initially 03/16/2020 with dizziness and fall onto left hip at home. Comprehensive imaging at that time revealed only a wedge compression fracture at L1. He was kept in the ED awaiting SNF bed until he was noted to be coughing consistently, worse with per oral intake on 03/20/2020. Aspiration was suspected and SARS-CoV-2 PCR was positive (had been negative 12/31). CXR revealed mild left base atelectasis on background of cardiomegaly. CRP was found to be 12.8, though WBC remained wnl. PCT 0.11, d-dimer 19.24. Subsequent CTA chest evaluation was limited by artifact with suspicion for chronic PE in right pulmonary artery, otherwise inconclusive. Also noted was mild posterior LLL consolidation with associated atelectasis/infiltrate and very small effusion. Heparin was started in addition to remdesivir, ceftriaxone, azithromycin, and decadron. Mental status changed, decreased speech, CT head with suggestion of left-sided infarct and large MCA infarct confirmed on MRI. Neurology has been consulted. Heparin was stopped due to concern for petechial hemorrhage, hemorrhagic transformation. Transfer to [facility] is planned per neurology for stroke specialist evaluation. Goals of care discussions are ongoing.  Assessment & Plan: Principal Problem:   COVID-19 virus infection Active Problems:   HTN (hypertension)   Alcohol abuse   Chronic systolic CHF (congestive heart failure) (HCC)   Chronic atrial fibrillation (HCC)   Chronic kidney disease, stage 3b (HCC)   Mixed hyperlipidemia   Acute encephalopathy   Fall   Failure to thrive in adult  Hypotension: Unclear etiology, possibly dehydration and medication effect. No other evidence of sepsis or  clinical evidence of bleeding.  - IVF - Hold antihypertensives.  - On miniscule dose of neo, will wean off.  Acute encephalopathy: Multifactorial including delirium due to being in ED for several days. This far out, unlikely to be alcohol withdrawal and vital signs don't support that Dx. Covid-19 contributing, will r/o intracerebral event.   Chronic right pulmonary artery PE: CTA unable to exclude other clots as well.  - Holding anticoagulation as above - Echocardiogram  Fall at home, L1 compression fracture:  - SNF disposition was planned. Will need to follow clinical progress to determine appropriate DC disposition.  Chronic HFrEF: Not overloaded currently. BNP 268, chronically elevated dating to 2019. - IVF, monitor I/O, weights  Acute left MCA stroke: With significant debility.  - Transfer to [facility] ASAP. Neurology consulted and has made recommendations.  - PT, OT, SLP - Palliative care consulted. - Holding statin while NPO.   Covid-19 infection: CRP significantly elevated. SpO2 nadir overnight was 89%.  - Continue remdesivir - Continue decadron - IS, FV, OOB, prone if able.   PAF, HTN:  - Hold anticoagulation and rate control agents, currently rate-controlled.   Aspiration pneumonia:  - W/LLL consolidation and suspicion for aspiration, will continue abx. Note PCN allergy.  AST elevation: Likely related to covid.  - Monitor  Thrombocytopenia: Likely related to covid, though has been present since at least Oct 2021. Remains >100k. Stable. - Continue anticoagulation as above. Monitor Plt count. Consider further work up in future.  Stage IIIb CKD: Scr at/below baseline initially, now rising again. - Pt remains NPO, will start IVF despite covid positivity for now, watch volume status closely with CHF.  Failure to thrive: In setting of all  other comorbidities, has poor prognosis regardless. Currently full code.  - Will engage family in code discussions and goals  of care moving forward. Discussed at length with daughter who is listed as patient's contact who confirms he would desire to be DNR in this setting where any cardiac arrest would result in demise most likely and possibly vegetative existence which would not be acceptable to the patient. I have consulted palliative care for assistance with these discussions going forward.   DVT prophylaxis: IV heparin Code Status: Full Family Communication: Attempted to call first contact last night after CT returned, no answer. Spoke with daughter at length this morning by phone. Disposition Plan:  Status is: Inpatient - Admit to [facility]  Remains inpatient appropriate because:Altered mental status, Ongoing diagnostic testing needed not appropriate for outpatient work up and Inpatient level of care appropriate due to severity of illness   Dispo: The patient is from: Home              Anticipated d/c is to: TBD              Anticipated d/c date is: > 3 days              Patient currently is not medically stable to d/c.  Consultants:   None  Procedures:   None  Antimicrobials:  Ceftriaxone, azithromycin, remdesivir   Subjective: Pt now overtly aphasic, not following commands with leftward gaze preference and dense right hemiparesis. Hypotensive overnight but no fever or bleeding noted by RN.  Objective: Vitals:   03/22/20 0730 03/22/20 0815 03/22/20 0830 03/22/20 0900  BP: 117/84 116/81 117/73 114/75  Pulse: 71  66 100  Resp: (!) 22 19 (!) 25 (!) 21  Temp:      TempSrc:      SpO2: 97%  98% 97%  Weight:      Height:        Intake/Output Summary (Last 24 hours) at 03/22/2020 1000 Last data filed at 03/21/2020 2132 Gross per 24 hour  Intake 2604.77 ml  Output --  Net 2604.77 ml   Filed Weights   03/16/20 2000  Weight: 93 kg   Gen: Chronically ill-appearing male in no distress Pulm: Nonlabored breathing. Clear. CV: Rate controlled. No murmur, rub, or gallop. No JVD, no dependent edema. GI:  Abdomen soft, non-tender, non-distended, with normoactive bowel sounds.  Ext: Warm, no deformities Skin: No rashes, lesions or ulcers on visualized skin. Neuro: Eyes open, did track me this morning on left side only, did not follow directions. Hemiparesis on right is worse.  Psych: UTD.  Data Reviewed: I have personally reviewed following labs and imaging studies  CBC: Recent Labs  Lab 03/15/20 2030 03/20/20 1304 03/21/20 0437 03/22/20 0528  WBC 9.1 5.7 5.5 10.7*  NEUTROABS  --  4.2 4.5 8.0*  HGB 16.5 14.4 13.9 11.2*  HCT 50.0 43.2 41.8 33.8*  MCV 98.4 98.6 98.1 99.7  PLT 118* 120* 120* 017*   Basic Metabolic Panel: Recent Labs  Lab 03/15/20 2030 03/20/20 1304 03/20/20 1432 03/21/20 0437 03/22/20 0528  NA 138 134*  --  134* 135  K 4.2 4.6  --  4.6 4.3  CL 100 102  --  103 104  CO2 24 23  --  20* 20*  GLUCOSE 99 121*  --  135* 121*  BUN 39* 32*  --  33* 53*  CREATININE 2.14* 1.33*  --  1.50* 2.22*  CALCIUM 9.7 8.7*  --  8.3* 7.8*  MG  --   --  1.8 2.2 2.4  PHOS  --   --  2.7  --   --    GFR: Estimated Creatinine Clearance: 31.6 mL/min (A) (by C-G formula based on SCr of 2.22 mg/dL (H)). Liver Function Tests: Recent Labs  Lab 03/15/20 2030 03/20/20 1304 03/21/20 0437 03/22/20 0528  AST 19 46* 48* 73*  ALT 10 18 18 25   ALKPHOS 63 60 57 50  BILITOT 1.8* 1.6* 0.9 0.5  PROT 8.3* 6.9 6.5 6.1*  ALBUMIN 4.0 3.2* 2.8* 2.9*   No results for input(s): LIPASE, AMYLASE in the last 168 hours. Recent Labs  Lab 03/20/20 1432  AMMONIA 26   Coagulation Profile: No results for input(s): INR, PROTIME in the last 168 hours. Cardiac Enzymes: Recent Labs  Lab 03/15/20 2030  CKTOTAL 371   BNP (last 3 results) No results for input(s): PROBNP in the last 8760 hours. HbA1C: No results for input(s): HGBA1C in the last 72 hours. CBG: No results for input(s): GLUCAP in the last 168 hours. Lipid Profile: No results for input(s): CHOL, HDL, LDLCALC, TRIG, CHOLHDL,  LDLDIRECT in the last 72 hours. Thyroid Function Tests: No results for input(s): TSH, T4TOTAL, FREET4, T3FREE, THYROIDAB in the last 72 hours. Anemia Panel: Recent Labs    03/21/20 0437 03/22/20 0528  FERRITIN 457* 1,697*   Urine analysis:    Component Value Date/Time   COLORURINE YELLOW 03/16/2020 0300   APPEARANCEUR HAZY (A) 03/16/2020 0300   LABSPEC 1.030 03/16/2020 0300   PHURINE 5.0 03/16/2020 0300   GLUCOSEU NEGATIVE 03/16/2020 0300   HGBUR NEGATIVE 03/16/2020 0300   BILIRUBINUR NEGATIVE 03/16/2020 0300   KETONESUR NEGATIVE 03/16/2020 0300   PROTEINUR 100 (A) 03/16/2020 0300   UROBILINOGEN 0.2 05/24/2014 0548   NITRITE NEGATIVE 03/16/2020 0300   LEUKOCYTESUR TRACE (A) 03/16/2020 0300   Recent Results (from the past 240 hour(s))  SARS CORONAVIRUS 2 (TAT 6-24 HRS) Nasopharyngeal Nasopharyngeal Swab     Status: None   Collection Time: 03/16/20  4:32 AM   Specimen: Nasopharyngeal Swab  Result Value Ref Range Status   SARS Coronavirus 2 NEGATIVE NEGATIVE Final    Comment: (NOTE) SARS-CoV-2 target nucleic acids are NOT DETECTED.  The SARS-CoV-2 RNA is generally detectable in upper and lower respiratory specimens during the acute phase of infection. Negative results do not preclude SARS-CoV-2 infection, do not rule out co-infections with other pathogens, and should not be used as the sole basis for treatment or other patient management decisions. Negative results must be combined with clinical observations, patient history, and epidemiological information. The expected result is Negative.  Fact Sheet for Patients: 03/18/20  Fact Sheet for Healthcare Providers: HairSlick.no  This test is not yet approved or cleared by the quierodirigir.com FDA and  has been authorized for detection and/or diagnosis of SARS-CoV-2 by FDA under an Emergency Use Authorization (EUA). This EUA will remain  in effect (meaning this  test can be used) for the duration of the COVID-19 declaration under Se ction 564(b)(1) of the Act, 21 U.S.C. section 360bbb-3(b)(1), unless the authorization is terminated or revoked sooner.  Performed at [facility], [address], [place], [place]       Radiology Studies: DG Chest 1 View  Result Date: 03/20/2020 CLINICAL DATA:  Failure to thrive.  Coughing. EXAM: CHEST  1 VIEW COMPARISON:  None. FINDINGS: Cardiomegaly. Chronic aortic atherosclerotic calcification. Right lung is clear. Question mild atelectasis at the left lung base. Evidence of heart failure or effusion. IMPRESSION: Question mild  atelectasis at the left lung base. Cardiomegaly. Aortic atherosclerosis. Electronically Signed   By: Paulina FusiMark  Shogry M.D.   On: 03/20/2020 11:11   CT HEAD WO CONTRAST  Result Date: 03/21/2020 CLINICAL DATA:  Altered mental status.  COVID-19 positive. EXAM: CT HEAD WITHOUT CONTRAST TECHNIQUE: Contiguous axial images were obtained from the base of the skull through the vertex without intravenous contrast. COMPARISON:  CT head dated March 16, 2020. FINDINGS: Brain: New hypodensity in the left temporal lobe with loss of the gray-white matter differentiation and sulcal effacement. Chronic infarcts in the left superior frontal gyrus, left parietal lobe, and left occipital lobe again noted. Chronic lacunar infarcts in the right basal ganglia, right corona radiata, right pons, and left cerebellum again noted. Unchanged moderate atrophy and severe chronic microvascular ischemic changes. Vascular: Calcified atherosclerosis at the skullbase. No hyperdense vessel. Skull: Normal. Negative for fracture or focal lesion. Sinuses/Orbits: No acute finding. Chronic opacification of the right mastoid air cells. Other: None. IMPRESSION: 1. New hypodensity in the left temporal lobe with loss of the gray-white matter differentiation and sulcal effacement, concerning for acute to subacute infarct. 2. Unchanged  atrophy, severe chronic microvascular ischemic changes, and multiple chronic infarcts. Electronically Signed   By: Obie DredgeWilliam T Derry M.D.   On: 03/21/2020 12:16   CT ANGIO CHEST PE W OR WO CONTRAST  Result Date: 03/20/2020 CLINICAL DATA:  Shortness of breath.  COVID positive. EXAM: CT ANGIOGRAPHY CHEST WITH CONTRAST TECHNIQUE: Multidetector CT imaging of the chest was performed using the standard protocol during bolus administration of intravenous contrast. Multiplanar CT image reconstructions and MIPs were obtained to evaluate the vascular anatomy. CONTRAST:  75mL OMNIPAQUE IOHEXOL 350 MG/ML SOLN COMPARISON:  February 02, 2012 FINDINGS: Cardiovascular: The segmental and subsegmental pulmonary arteries are limited in evaluation secondary to patient motion and suboptimal opacification with intravenous contrast. This is most prominent within the bilateral lower lobes. A thin linear focus of intraluminal low attenuation is seen within the distal aspect of the right pulmonary artery (axial CT images 139 through 143, CT series number 5). There is mild cardiomegaly with marked severity coronary artery calcification. No pericardial effusion. Mediastinum/Nodes: No enlarged mediastinal, hilar, or axillary lymph nodes. Thyroid gland, trachea, and esophagus demonstrate no significant findings. Lungs/Pleura: A mild amount of consolidation is seen along the posterior aspect of the left lower lobe. A small amount of atelectasis and/or infiltrate is also seen along the lateral aspect of the left lower lobe. A very small left pleural effusion is noted. No pneumothorax is identified. Upper Abdomen: The visualized portion of the upper pole of the left kidney is atrophic in appearance. Musculoskeletal: Multilevel degenerative changes seen throughout the thoracic spine Review of the MIP images confirms the above findings. IMPRESSION: 1. Limited evaluation of the pulmonary arteries, as described above. Given the degree of visible  artifact, acute pulmonary embolism cannot be excluded. Correlation with follow-up chest CTA is recommended. 2. 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 3. Mild posterior left lower lobe consolidation with additional small areas of left lower lobe atelectasis and/or infiltrate. 4. Very small left pleural effusion. Electronically Signed   By: Aram Candelahaddeus  Houston M.D.   On: 03/20/2020 19:27   MR BRAIN WO CONTRAST  Result Date: 03/21/2020 CLINICAL DATA:  Initial evaluation for acute neuro deficit, stroke suspected, mental status change. EXAM: MRI HEAD WITHOUT CONTRAST TECHNIQUE: Multiplanar, multiecho pulse sequences of the brain and surrounding structures were obtained without intravenous contrast. COMPARISON:  Prior head CT from earlier the same day. FINDINGS: Brain: Examination severely degraded by motion artifact, limiting assessment. Generalized age-related cerebral atrophy. Extensive patchy and confluent T2/FLAIR hyperintensity within the periventricular deep white matter both cerebral hemispheres most consistent with chronic small vessel ischemic disease, advanced in nature. Small remote lacunar infarcts noted at the right basal ganglia and possibly left basal ganglia and thalamus as well. Few additional remote lacunar infarcts at the right pons. Small focus of encephalomalacia at the paramedian left occipital lobe consistent with a chronic left PCA territory infarct. Additional chronic left ACA territory infarct present at the high parasagittal left frontal lobe. Moderate to large area of confluent restricted diffusion seen involving the left temporal lobe, consistent with an acute left MCA distribution infarct. Patchy involvement of the insula. Posterior extension to involve the left parieto-occipital region. Suspected associated petechial hemorrhage, difficult to see on this exam, although is likely evident on prior CT. No frank  hemorrhagic transformation or significant regional mass effect. No other foci of restricted diffusion to suggest acute or subacute ischemia. Gray-white matter differentiation otherwise maintained. No visible acute intracranial hemorrhage on this motion degraded exam. No mass lesion, midline shift, or significant mass effect. Diffuse ventricular prominence related to global parenchymal volume loss without hydrocephalus. No extra-axial fluid collection. Pituitary gland and suprasellar region normal. Midline structures intact. Vascular: Major intracranial vascular flow voids are grossly maintained at the skull base. Skull and upper cervical spine: Craniocervical junction within normal limits. Bone marrow signal intensity normal. No scalp soft tissue abnormality. Sinuses/Orbits: Globes and orbital soft tissues grossly within normal limits. Mild mucosal thickening noted within the right maxillary sinus. Paranasal sinuses are otherwise clear. Bilateral mastoid effusions noted. Visualized nasopharynx grossly unremarkable. Other: None. IMPRESSION: 1. Technically limited exam due to extensive motion artifact. 2. Moderate to large sized acute left MCA distribution infarct, primarily involving the left temporal lobe as above. Suspected associated petechial hemorrhage without frank hemorrhagic transformation or significant regional mass effect. 3. Underlying age-related cerebral atrophy with advanced chronic microvascular ischemic disease, with superimposed chronic left PCA and ACA territory infarcts. Electronically Signed   By: Rise Mu M.D.   On: 03/21/2020 23:16   ECHOCARDIOGRAM LIMITED  Result Date: 03/21/2020    ECHOCARDIOGRAM LIMITED REPORT   Patient Name:   Brian Mclaughlin Date of Exam: 03/21/2020 Medical Rec #:  [id number]   Height:       72.0 in Accession #:    [id number]  Weight:       205.0 lb Date of Birth:  07/01/1945  BSA:          2.153 m Patient Age:    74 years    BP:           150/98 mmHg Patient  Gender: M           HR:           67 bpm. Exam Location:  Inpatient Procedure: Limited Echo, Cardiac Doppler, Color Doppler and Intracardiac            Opacification Agent Indications:    CHF, pulmonary Embolus  History:        Patient has prior history of Echocardiogram examinations. CHF,                 Stroke, Arrythmias:Atrial Fibrillation; Risk                 Factors:Hypertension and Dyslipidemia. COVID +, CKD, alcohol  abuse.  Sonographer:    Lavenia Atlas Referring Phys: [phone] Tyrone Nine IMPRESSIONS  1. Left ventricular ejection fraction, by estimation, is 45 to 50%. The left ventricle has mildly decreased function. The left ventricle demonstrates global hypokinesis. There is mild concentric left ventricular hypertrophy. Left ventricular diastolic function could not be evaluated.  2. Right ventricular systolic function is normal. The right ventricular size is normal. There is normal pulmonary artery systolic pressure.  3. The mitral valve is normal in structure. Mild mitral valve regurgitation. No evidence of mitral stenosis.  4. The aortic valve is normal in structure. Aortic valve regurgitation is mild. No aortic stenosis is present.  5. There is mild dilatation of the aortic root and at the level of the sinuses of Valsalva, measuring 45 mm.  6. The inferior vena cava is normal in size with greater than 50% respiratory variability, suggesting right atrial pressure of 3 mmHg. FINDINGS  Left Ventricle: Left ventricular ejection fraction, by estimation, is 45 to 50%. The left ventricle has mildly decreased function. The left ventricle demonstrates global hypokinesis. Definity contrast agent was given IV to delineate the left ventricular  endocardial borders. The left ventricular internal cavity size was normal in size. There is mild concentric left ventricular hypertrophy. Left ventricular diastolic function could not be evaluated. Left ventricular diastolic function could not be evaluated  due to atrial fibrillation. Right Ventricle: The right ventricular size is normal. No increase in right ventricular wall thickness. Right ventricular systolic function is normal. There is normal pulmonary artery systolic pressure. The tricuspid regurgitant velocity is 2.21 m/s, and  with an assumed right atrial pressure of 3 mmHg, the estimated right ventricular systolic pressure is 22.5 mmHg. Left Atrium: Left atrial size was normal in size. Right Atrium: Right atrial size was normal in size. Pericardium: There is no evidence of pericardial effusion. Mitral Valve: The mitral valve is normal in structure. Mild mitral valve regurgitation. No evidence of mitral valve stenosis. Tricuspid Valve: The tricuspid valve is normal in structure. Tricuspid valve regurgitation is mild . No evidence of tricuspid stenosis. Aortic Valve: The aortic valve is normal in structure. Aortic valve regurgitation is mild. Aortic regurgitation PHT measures 852 msec. No aortic stenosis is present. Pulmonic Valve: The pulmonic valve was normal in structure. Pulmonic valve regurgitation is not visualized. No evidence of pulmonic stenosis. Aorta: The aortic root is normal in size and structure. There is mild dilatation of the aortic root and at the level of the sinuses of Valsalva, measuring 45 mm. Venous: The inferior vena cava is normal in size with greater than 50% respiratory variability, suggesting right atrial pressure of 3 mmHg. IAS/Shunts: No atrial level shunt detected by color flow Doppler. LEFT VENTRICLE PLAX 2D LVIDd:         5.50 cm LVIDs:         4.00 cm LV PW:         1.30 cm LV IVS:        1.30 cm LVOT diam:     2.60 cm LV SV:         57 LV SV Index:   26 LVOT Area:     5.31 cm  LEFT ATRIUM           Index LA diam:      3.90 cm 1.81 cm/m LA Vol (A4C): 41.3 ml 19.18 ml/m  AORTIC VALVE LVOT Vmax:   59.40 cm/s LVOT Vmean:  37.800 cm/s LVOT VTI:    0.107 m AI  PHT:      852 msec  AORTA Ao Root diam: 4.50 cm MITRAL VALVE                TRICUSPID VALVE MV Area (PHT): 2.87 cm    TR Peak grad:   19.5 mmHg MV Decel Time: 264 msec    TR Vmax:        221.00 cm/s MV E velocity: 49.70 cm/s MV A velocity: 27.00 cm/s  SHUNTS MV E/A ratio:  1.84        Systemic VTI:  0.11 m                            Systemic Diam: 2.60 cm Armanda Magicraci Turner MD Electronically signed by Armanda Magicraci Turner MD Signature Date/Time: 03/21/2020/4:51:44 PM    Final     Scheduled Meds: .  stroke: mapping our early stages of recovery book   Does not apply Once  . aspirin  300 mg Rectal Daily  . dexamethasone (DECADRON) injection  6 mg Intravenous Daily  . sodium chloride flush  3 mL Intravenous Q12H   Continuous Infusions: . sodium chloride 250 mL (03/21/20 2334)  . azithromycin Stopped (03/21/20 1920)  . cefTRIAXone (ROCEPHIN)  IV Stopped (03/21/20 1812)  . phenylephrine (NEO-SYNEPHRINE) Adult infusion 25 mcg/min (03/22/20 0815)  . remdesivir 100 mg in NS 100 mL 100 mg (03/22/20 0812)     LOS: 2 days   Time spent: 35 minutes.  Tyrone Nineyan B Jequan Shahin, MD [facility] [email or web address] 03/22/2020, 10:00 AM

## 2020-03-22 NOTE — ED Notes (Signed)
Pt is in bed, pt gazing to the left , pt will not track me around room, pt placed on 2 l [name]

## 2020-03-22 NOTE — Progress Notes (Signed)
Brief Neuro Update:  Briefly, [name] is a 75 y/o M with COVI and a fairly large acute L MCA territory stroke on MRI Brain with CT Angio demonstrating high grade stenosis or proximal left M2 MCA branch. Exam by our team in AM with dense aphasia, R hemianopia and severe R hemiparesis and R sided extinction on exam.  Discussed with Dr. Jarvis Newcomer about goals of care conversation that he had with family. Patient is DNR right now and had previously expressed wishes to family regarding withdrawal of care if significantly disabled.  Recs: - Discussed repeat CTH without contrast in AM to assess for Cerebral edema. - Transferring the patient to [facility] is unlikely to change the outcome. He has significantly atrophied cerebral cortex and I do not expect him to develop significant midline shift and potential for herniation is low. - continue Aspirin daily PO or Per-rectal.  Erick Blinks [facility] Pager Number [phone]

## 2020-03-23 DIAGNOSIS — N1832 Chronic kidney disease, stage 3b: Secondary | ICD-10-CM | POA: Diagnosis not present

## 2020-03-23 DIAGNOSIS — L8990 Pressure ulcer of unspecified site, unspecified stage: Secondary | ICD-10-CM | POA: Insufficient documentation

## 2020-03-23 DIAGNOSIS — I63512 Cerebral infarction due to unspecified occlusion or stenosis of left middle cerebral artery: Secondary | ICD-10-CM | POA: Diagnosis not present

## 2020-03-23 DIAGNOSIS — G9340 Encephalopathy, unspecified: Secondary | ICD-10-CM | POA: Diagnosis not present

## 2020-03-23 DIAGNOSIS — F1010 Alcohol abuse, uncomplicated: Secondary | ICD-10-CM | POA: Diagnosis not present

## 2020-03-23 DIAGNOSIS — Z7189 Other specified counseling: Secondary | ICD-10-CM

## 2020-03-23 DIAGNOSIS — I482 Chronic atrial fibrillation, unspecified: Secondary | ICD-10-CM | POA: Diagnosis not present

## 2020-03-23 DIAGNOSIS — I5022 Chronic systolic (congestive) heart failure: Secondary | ICD-10-CM | POA: Diagnosis not present

## 2020-03-23 DIAGNOSIS — Z66 Do not resuscitate: Secondary | ICD-10-CM

## 2020-03-23 DIAGNOSIS — Z515 Encounter for palliative care: Secondary | ICD-10-CM

## 2020-03-23 LAB — COMPREHENSIVE METABOLIC PANEL
ALT: 24 U/L (ref 0–44)
AST: 65 U/L — ABNORMAL HIGH (ref 15–41)
Albumin: 2.6 g/dL — ABNORMAL LOW (ref 3.5–5.0)
Alkaline Phosphatase: 48 U/L (ref 38–126)
Anion gap: 9 (ref 5–15)
BUN: 64 mg/dL — ABNORMAL HIGH (ref 8–23)
CO2: 21 mmol/L — ABNORMAL LOW (ref 22–32)
Calcium: 7.8 mg/dL — ABNORMAL LOW (ref 8.9–10.3)
Chloride: 108 mmol/L (ref 98–111)
Creatinine, Ser: 2.14 mg/dL — ABNORMAL HIGH (ref 0.61–1.24)
GFR, Estimated: 31 mL/min — ABNORMAL LOW (ref >60)
Glucose, Bld: 118 mg/dL — ABNORMAL HIGH (ref 70–99)
Potassium: 4.5 mmol/L (ref 3.5–5.1)
Sodium: 138 mmol/L (ref 135–145)
Total Bilirubin: 0.6 mg/dL (ref 0.3–1.2)
Total Protein: 5.6 g/dL — ABNORMAL LOW (ref 6.5–8.1)

## 2020-03-23 LAB — CBC WITH DIFFERENTIAL/PLATELET
Abs Immature Granulocytes: 0.06 10*3/uL (ref 0.00–0.07)
Basophils Absolute: 0 10*3/uL (ref 0.0–0.1)
Basophils Relative: 0 %
Eosinophils Absolute: 0 10*3/uL (ref 0.0–0.5)
Eosinophils Relative: 0 %
HCT: 27.7 % — ABNORMAL LOW (ref 39.0–52.0)
Hemoglobin: 9.1 g/dL — ABNORMAL LOW (ref 13.0–17.0)
Immature Granulocytes: 1 %
Lymphocytes Relative: 8 %
Lymphs Abs: 1 10*3/uL (ref 0.7–4.0)
MCH: 32.9 pg (ref 26.0–34.0)
MCHC: 32.9 g/dL (ref 30.0–36.0)
MCV: 100 fL (ref 80.0–100.0)
Monocytes Absolute: 1.2 10*3/uL — ABNORMAL HIGH (ref 0.1–1.0)
Monocytes Relative: 10 %
Neutro Abs: 9.8 10*3/uL — ABNORMAL HIGH (ref 1.7–7.7)
Neutrophils Relative %: 81 %
Platelets: 112 10*3/uL — ABNORMAL LOW (ref 150–400)
RBC: 2.77 MIL/uL — ABNORMAL LOW (ref 4.22–5.81)
RDW: 13.4 % (ref 11.5–15.5)
WBC: 12 10*3/uL — ABNORMAL HIGH (ref 4.0–10.5)
nRBC: 0 % (ref 0.0–0.2)

## 2020-03-23 LAB — LIPID PANEL
Cholesterol: 76 mg/dL (ref 0–200)
HDL: 29 mg/dL — ABNORMAL LOW (ref >40)
LDL Cholesterol: 29 mg/dL (ref 0–99)
Total CHOL/HDL Ratio: 2.6 RATIO
Triglycerides: 88 mg/dL (ref <150)
VLDL: 18 mg/dL (ref 0–40)

## 2020-03-23 LAB — C-REACTIVE PROTEIN: CRP: 5.4 mg/dL — ABNORMAL HIGH (ref <1.0)

## 2020-03-23 LAB — HEMOGLOBIN A1C
Hgb A1c MFr Bld: 5.3 % (ref 4.8–5.6)
Mean Plasma Glucose: 105.41 mg/dL

## 2020-03-23 LAB — MAGNESIUM: Magnesium: 2.4 mg/dL (ref 1.7–2.4)

## 2020-03-23 LAB — D-DIMER, QUANTITATIVE: D-Dimer, Quant: 10.74 ug/mL-FEU — ABNORMAL HIGH (ref 0.00–0.50)

## 2020-03-23 LAB — FERRITIN: Ferritin: 844 ng/mL — ABNORMAL HIGH (ref 24–336)

## 2020-03-23 MED ORDER — MORPHINE SULFATE (CONCENTRATE) 10 MG/0.5ML PO SOLN: 5.0000 mg | ORAL | Status: DC | PRN

## 2020-03-23 MED ORDER — MORPHINE SULFATE (PF) 2 MG/ML IV SOLN: 1.0000 mg | INTRAVENOUS | Status: DC | PRN

## 2020-03-23 MED ORDER — BIOTENE DRY MOUTH MT LIQD: 15.0000 mL | OROMUCOSAL | Status: DC | PRN

## 2020-03-23 MED ORDER — ONDANSETRON HCL 4 MG/2ML IJ SOLN: 4.0000 mg | Freq: Four times a day (QID) | INTRAMUSCULAR | Status: DC | PRN

## 2020-03-23 MED ORDER — LORAZEPAM 2 MG/ML IJ SOLN: 1.0000 mg | INTRAMUSCULAR | Status: DC | PRN

## 2020-03-23 MED ORDER — POLYVINYL ALCOHOL 1.4 % OP SOLN
1.0000 [drp] | Freq: Four times a day (QID) | OPHTHALMIC | Status: DC | PRN
  Filled 2020-03-23: qty 15

## 2020-03-23 MED ORDER — GLYCOPYRROLATE 0.2 MG/ML IJ SOLN: 0.3000 mg | INTRAMUSCULAR | Status: DC | PRN

## 2020-03-23 MED ORDER — DIPHENHYDRAMINE HCL 50 MG/ML IJ SOLN: 12.5000 mg | INTRAMUSCULAR | Status: DC | PRN

## 2020-03-23 NOTE — Care Management Important Message (Signed)
Important Message  Patient Details IM Letter given to the Patient. Name: Brian Mclaughlin MRN: [id number] Date of Birth: March 26, 1945   Medicare Important Message Given:  Yes     [name] 03/23/2020, 11:49 AM

## 2020-03-23 NOTE — Progress Notes (Signed)
Brief Neuro Update:  Patient transitioned to comfort care pathway. We will signoff.  Erick Blinks [facility] Pager Number [phone]

## 2020-03-23 NOTE — TOC Progression Note (Signed)
Transition of Care [facility]) - Progression Note    Patient Details  Name: Brian Mclaughlin MRN: [id number] Date of Birth: May 28, 1945  Transition of Care [facility]) CM/SW Contact  Geni Bers, RN Phone Number: 03/23/2020, 4:16 PM  Clinical Narrative:    Referral given to [facility].    Expected Discharge Plan: Skilled Nursing Facility Barriers to Discharge: No Barriers Identified  Expected Discharge Plan and Services Expected Discharge Plan: Skilled Nursing Facility       Living arrangements for the past 2 months: Single Family Home                                       Social Determinants of Health (SDOH) Interventions    Readmission Risk Interventions Readmission Risk Prevention Plan 07/29/2019  Transportation Screening Complete  PCP or Specialist Appt within 5-7 Days Patient refused  Home Care Screening Patient refused  Medication Review (RN CM) Referral to Pharmacy  Some recent data might be hidden

## 2020-03-23 NOTE — Plan of Care (Signed)
Patient made comfort care but is still positive for Covid-19 Problem: Education: Goal: Knowledge of General Education information will improve Description: Including pain rating scale, medication(s)/side effects and non-pharmacologic comfort measures Outcome: Not Progressing   Problem: Health Behavior/Discharge Planning: Goal: Ability to manage health-related needs will improve Outcome: Not Progressing   Problem: Clinical Measurements: Goal: Ability to maintain clinical measurements within normal limits will improve Outcome: Not Progressing Goal: Will remain free from infection Outcome: Not Progressing Goal: Diagnostic test results will improve Outcome: Not Progressing Goal: Respiratory complications will improve Outcome: Not Progressing Goal: Cardiovascular complication will be avoided Outcome: Not Progressing   Problem: Activity: Goal: Risk for activity intolerance will decrease Outcome: Not Progressing   Problem: Nutrition: Goal: Adequate nutrition will be maintained Outcome: Not Progressing   Problem: Coping: Goal: Level of anxiety will decrease Outcome: Not Progressing   Problem: Elimination: Goal: Will not experience complications related to bowel motility Outcome: Not Progressing Goal: Will not experience complications related to urinary retention Outcome: Not Progressing   Problem: Pain Managment: Goal: General experience of comfort will improve Outcome: Not Progressing   Problem: Safety: Goal: Ability to remain free from injury will improve Outcome: Not Progressing   Problem: Skin Integrity: Goal: Risk for impaired skin integrity will decrease Outcome: Not Progressing   Problem: Education: Goal: Knowledge of risk factors and measures for prevention of condition will improve Outcome: Not Progressing   Problem: Coping: Goal: Psychosocial and spiritual needs will be supported Outcome: Not Progressing   Problem: Respiratory: Goal: Will maintain a  patent airway Outcome: Not Progressing Goal: Complications related to the disease process, condition or treatment will be avoided or minimized Outcome: Not Progressing

## 2020-03-23 NOTE — Evaluation (Signed)
Speech Language Pathology Evaluation Patient Details Name: Brian Mclaughlin MRN: [id number] DOB: [birth year] Today's Date: 03/23/2020 Time: 7494-4967 SLP Time Calculation (min) (ACUTE ONLY): 20 min  Problem List:  Patient Active Problem List   Diagnosis Date Noted  . Pressure injury of skin 03/23/2020  . 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 Failure to thrive in adult 03/20/2020  . COVID-19 virus infection 03/20/2020  . Acute encephalopathy 01/06/2020  . Fall 01/06/2020  . Nicotine dependence, cigarettes, uncomplicated 07/28/2019  . Mixed hyperlipidemia 07/28/2019  . Hyperkalemia 07/28/2019  . Cellulitis of right lower extremity 07/25/2019  . Generalized weakness 03/10/2018  . Fever 03/10/2018  . Chronic kidney disease, stage 3b (HCC) 03/10/2018  . Abnormality of gait 06/14/2014  . Chronic atrial fibrillation (HCC)   . Stroke, embolic (HCC)   . Noncompliance with medications   . Acute ischemic stroke (HCC) 05/24/2014  . CVA (cerebral infarction) 05/24/2014  . Chronic systolic CHF (congestive heart failure) (HCC)   . Stroke (HCC) 08/23/2011  . HTN (hypertension) 08/23/2011  . Alcohol abuse 08/23/2011  . LFTs abnormal 08/23/2011  . Acute renal failure (HCC) 08/23/2011  . Rhabdomyolysis 08/23/2011   Past Medical History:  Past Medical History:  Diagnosis Date  . Abnormality of gait 06/14/2014  . Acute CVA (cerebrovascular accident) (HCC) 05/24/2014  . Acute kidney failure, unspecified (HCC)   . Acute, but ill-defined, cerebrovascular disease   . Alcohol abuse   . Altered mental status   . Atrial fibrillation (HCC)   . Chronic systolic CHF (congestive heart failure) (HCC)   . CVA (cerebral infarction)   . ETOH abuse   . Hyperlipidemia   . Hypertension   . Nonspecific elevation of levels of transaminase or lactic acid dehydrogenase (LDH)   . Rhabdomyolysis   . Tobacco abuse   . Tobacco use disorder    Past  Surgical History:  Past Surgical History:  Procedure Laterality Date  . BACK SURGERY  10 years ago   "slipped disc" repair  . EYE MUSCLE SURGERY     as a teenager "tighten his muscles"   HPI:  AFib, chronic HFrEF, stage IIIb CKD, CVA w/dysarthria, alcohol abuse, and HTN who presented to the ED initially 03/16/2020 with dizziness and fall onto left hip at home. Comprehensive imaging at that time revealed only a wedge compression fracture at L1. He was kept in the ED awaiting SNF bed until he was noted to be coughing consistently, worse with per oral intake on 03/20/2020. Aspiration was suspected and SARS-CoV-2 PCR was positive (had been negative 12/31).   Pt found to have large Left MCA CVA, Imaging of chest showed ? chronic right pulmonary artery PE, mild LLL consolidation with assoc ATX/infiltration and small effusion.  Swallow and speech eval ordered.   Assessment / Plan / Recommendation Clinical Impression  Patient presents with global aphasia without ability to participate in his medical care unfortunately.  He did not follow directions and his verbal expression was 99% sterotypical utterances including mostly repeating "sisis" with occasional "wha".  He also presents with facial nerve deficit resulting in right facial asymmetry and decreased right labial closure.    With familiar questions, such as when asked if he was hungry, he nodded no, but when asked if thristy, he repeated "thirsty" appropriately.  His only other verbal expression that was understandable was "why" at end of session.  SLP explained to him that he had a stroke.  Pt appeared outwardly  frustrated, frequently sighing.  He did not follow one step directions but would assist with familar tasks, eg: helping to hold his own cup, helping to slide himself up in bed with SLP placing left arm up to bed rail etc. SLP singing happy birthday to help elicit speech *pt birthday [birth year]* was not effective.   SLP turned on music from the 50s  for the pt, to which he did not approve or decline music.     Focusing on simple questions (yes/no) that focus around activities such as eating/drinking may be helpful for basic communication.    SLP Assessment  SLP Recommendation/Assessment: Patient does not need any further Speech Lanaguage Pathology Services (pt is comfort care) SLP Visit Diagnosis: Aphasia (R47.01)    Follow Up Recommendations  None    Frequency and Duration   n/a        SLP Evaluation Cognition  Overall Cognitive Status: Difficult to assess (pt has severe aphasia) Arousal/Alertness: Awake/alert       Comprehension  Auditory Comprehension Overall Auditory Comprehension: Impaired Yes/No Questions: Not tested Commands: Impaired One Step Basic Commands: 0-24% accurate Interfering Components: Processing speed Reading Comprehension Reading Status: Impaired Word level: Impaired (pt able to identify his name from written choice of two) Sentence Level: Not tested Paragraph Level: Not tested Functional Environmental (signs, name badge): Not tested    Expression Expression Primary Mode of Expression: Other (comment) Verbal Expression Overall Verbal Expression: Impaired Initiation: Impaired Automatic Speech:  (pt did not approximately any words including counting, singing happy birthday * his birthday was [birth year]) Naming: Impairment Responsive: 0-25% accurate (pt did not name any items with max cues, visual, written, etc) Confrontation: Impaired Verbal Errors: Neologisms;Jargon;Not aware of errors (pt with stereotypical utterance of "sisisi" "wha") Pragmatics:  (pt smiled when speaking of his name) Written Expression Dominant Hand: Right Written Expression: Not tested (pt presents with gross weakness of right arm, SLP elevated it on pillow)   Oral / Motor  Oral Motor/Sensory Function Overall Oral Motor/Sensory Function: Severe impairment Facial ROM: Reduced right Facial Symmetry: Abnormal symmetry  right Facial Strength: Reduced right Lingual Symmetry: Abnormal symmetry right Lingual Strength: Reduced Velum: Other (comment) (difficult to view but appeared bilateral) Mandible: Other (Comment) (DNT) Motor Speech Overall Motor Speech: Impaired Respiration: Impaired Phonation: Low vocal intensity Intelligibility: Unable to assess (comment) Motor Planning: Not tested   GO                    Chales Abrahams 03/23/2020, 5:05 PM   Rolena Infante, MS [facility] SLP Acute Rehab Services Office [phone] Pager [phone]

## 2020-03-23 NOTE — Progress Notes (Signed)
PROGRESS NOTE  Brian Mclaughlin  ZOX:[id number] DOB: 1945/11/02 DOA: 03/16/2020 PCP: Patient, No Pcp Per   Brief Narrative: Brian Mclaughlin is a 75 y.o. male with a history of AFib, chronic HFrEF, stage IIIb CKD, CVA w/dysarthria, alcohol abuse, and HTN who presented to the ED initially 03/16/2020 with dizziness and fall onto left hip at home. Comprehensive imaging at that time revealed only a wedge compression fracture at L1. He was kept in the ED awaiting SNF bed until he was noted to be coughing consistently, worse with per oral intake on 03/20/2020. Aspiration was suspected and SARS-CoV-2 PCR was positive (had been negative 12/31). CXR revealed mild left base atelectasis on background of cardiomegaly. CRP was found to be 12.8, though WBC remained wnl. PCT 0.11, d-dimer 19.24. Subsequent CTA chest evaluation was limited by artifact with suspicion for chronic PE in right pulmonary artery, otherwise inconclusive. Also noted was mild posterior LLL consolidation with associated atelectasis/infiltrate and very small effusion. Heparin was started in addition to remdesivir, ceftriaxone, azithromycin, and decadron.   Unfortunately the patient developed significant aphasia and right sided hemiparesis/extinction with neuroimaging confirming fairly large left MCA infarct with proximal M2 branch stenosis or occlusion. Speech therapy evaluation is pending and will likely inform disposition venue as goals of care discussions are continued.  Assessment & Plan: Principal Problem:   COVID-19 virus infection Active Problems:   HTN (hypertension)   Alcohol abuse   Chronic systolic CHF (congestive heart failure) (HCC)   Chronic atrial fibrillation (HCC)   Chronic kidney disease, stage 3b (HCC)   Mixed hyperlipidemia   Acute encephalopathy   Fall   Failure to thrive in adult   Acute cerebrovascular accident (CVA) due to occlusion of left middle cerebral artery (HCC)   Pressure injury of skin  Hypotension: Transient,  due to dehydration and medication effect. No other evidence of sepsis or clinical evidence of bleeding.   Acute encephalopathy: Due to stroke most likely.  Chronic right pulmonary artery PE: CTA unable to exclude other clots as well. Echocardiogram with normal RV size, function, and normal estimated PA pressure.   - Holding anticoagulation as above  Fall at home, L1 compression fracture:  - SNF disposition was planned. Will need to follow clinical progress to determine appropriate DC disposition (i.e. may be more hospice appropriate)  Chronic HFrEF: Not overloaded currently. BNP 268, chronically elevated dating to 2019. - IVF while NPO, not becoming overloaded on exam, monitor I/O, weights  Acute left MCA stroke: With significant debility, proximal left M2 stenosis vs occlusion. Neurology now recommending against transfer based on goals of care discussions with family.  - Needs PT, OT, SLP evaluations - Repeat CT scan to eval edema per neuro - Palliative care consulted. - Holding statin while NPO.   Covid-19 infection: CRP significantly elevated.  - Continue remdesivir, will complete 1/8 - Continue decadron due to transient hypoxia.  - IS, FV, OOB, prone if able.   PAF, HTN:  - Hold anticoagulation and rate control agents, remains rate-controlled.   Aspiration pneumonia:  - W/LLL consolidation and suspicion for aspiration, will continue abx. Note PCN allergy.  AST elevation: Likely related to covid.  - Monitor  Thrombocytopenia: Likely related to covid, though has been present since at least Oct 2021. Remains >100k. Stable. - Continue anticoagulation as above. Monitor Plt count. Consider further work up in future.  Stage IIIb CKD: Scr at/below baseline initially, now rising again. - Pt remains NPO, will start IVF despite covid positivity for now,  watch volume status closely with CHF.  Failure to thrive: In setting of all other comorbidities, has poor prognosis regardless.  Currently full code.  - Will engage family in code discussions and goals of care moving forward. Discussed at length with daughter who is listed as patient's contact who confirms he would desire to be DNR in this setting where any cardiac arrest would result in demise most likely and possibly vegetative existence which would not be acceptable to the patient. I have consulted palliative care for assistance with these discussions going forward.   DVT prophylaxis: SCDs Code Status: Full Family Communication: Daughter by phone Disposition Plan:  Status is: Inpatient    Remains inpatient appropriate because:Altered mental status, Ongoing diagnostic testing needed not appropriate for outpatient work up and Inpatient level of care appropriate due to severity of illness   Dispo: The patient is from: Home              Anticipated d/c is to: TBD              Anticipated d/c date is: > 3 days              Patient currently is not medically stable to d/c.  Consultants:   None  Procedures:   None  Antimicrobials:  Ceftriaxone, azithromycin, remdesivir   Subjective: No major changes in clinical status. Unable to communicate due to stroke. SLP eval pending.   Objective: Vitals:   03/22/20 2240 03/23/20 0334 03/23/20 0500 03/23/20 0705  BP: (!) 127/106 130/89  107/86  Pulse: 80 78  77  Resp: 20 18  20   Temp: (!) 97.5 F (36.4 C) 97.7 F (36.5 C)    TempSrc: Oral     SpO2: 99% 100%  (!) 77%  Weight: 97.8 kg  97.8 kg   Height: 6' (1.829 m)       Intake/Output Summary (Last 24 hours) at 03/23/2020 1206 Last data filed at 03/23/2020 0300 Gross per 24 hour  Intake 313.5 ml  Output --  Net 313.5 ml   Filed Weights   03/16/20 2000 03/22/20 2240 03/23/20 0500  Weight: 93 kg 97.8 kg 97.8 kg   Gen: 75 y.o. male in no distress Pulm: Nonlabored breathing room air. Clear anteriorly CV: Regular rate and rhythm. No murmur, rub, or gallop. No JVD, no dependent edema. GI: Abdomen soft,  non-tender, non-distended, with normoactive bowel sounds.  Ext: Warm, no deformities Skin: No rashes, lesions or ulcers on visualized skin. Neuro: Alert, unable to assess orientation due to severe aphasia. Did not follow commands for me today, will spontaneously use grasping motions of right hand but not LLE or RUE otherwise. Leftward gaze is nearly fixed with no crossing midline Psych: Calm, UTD.  Data Reviewed: I have personally reviewed following labs and imaging studies  CBC: Recent Labs  Lab 03/20/20 1304 03/21/20 0437 03/22/20 0528 03/23/20 0511  WBC 5.7 5.5 10.7* 12.0*  NEUTROABS 4.2 4.5 8.0* 9.8*  HGB 14.4 13.9 11.2* 9.1*  HCT 43.2 41.8 33.8* 27.7*  MCV 98.6 98.1 99.7 100.0  PLT 120* 120* 130* 112*   Basic Metabolic Panel: Recent Labs  Lab 03/20/20 1304 03/20/20 1432 03/21/20 0437 03/22/20 0528 03/23/20 0511  NA 134*  --  134* 135 138  K 4.6  --  4.6 4.3 4.5  CL 102  --  103 104 108  CO2 23  --  20* 20* 21*  GLUCOSE 121*  --  135* 121* 118*  BUN 32*  --  33* 53* 64*  CREATININE 1.33*  --  1.50* 2.22* 2.14*  CALCIUM 8.7*  --  8.3* 7.8* 7.8*  MG  --  1.8 2.2 2.4 2.4  PHOS  --  2.7  --   --   --    GFR: Estimated Creatinine Clearance: 36.2 mL/min (A) (by C-G formula based on SCr of 2.14 mg/dL (H)). Liver Function Tests: Recent Labs  Lab 03/20/20 1304 03/21/20 0437 03/22/20 0528 03/23/20 0511  AST 46* 48* 73* 65*  ALT 18 18 25 24   ALKPHOS 60 57 50 48  BILITOT 1.6* 0.9 0.5 0.6  PROT 6.9 6.5 6.1* 5.6*  ALBUMIN 3.2* 2.8* 2.9* 2.6*   No results for input(s): LIPASE, AMYLASE in the last 168 hours. Recent Labs  Lab 03/20/20 1432  AMMONIA 26   Coagulation Profile: No results for input(s): INR, PROTIME in the last 168 hours. Cardiac Enzymes: No results for input(s): CKTOTAL, CKMB, CKMBINDEX, TROPONINI in the last 168 hours. BNP (last 3 results) No results for input(s): PROBNP in the last 8760 hours. HbA1C: Recent Labs    03/23/20 0511  HGBA1C 5.3    CBG: No results for input(s): GLUCAP in the last 168 hours. Lipid Profile: Recent Labs    03/23/20 0511  CHOL 76  HDL 29*  LDLCALC 29  TRIG 88  CHOLHDL 2.6   Thyroid Function Tests: No results for input(s): TSH, T4TOTAL, FREET4, T3FREE, THYROIDAB in the last 72 hours. Anemia Panel: Recent Labs    03/22/20 0528 03/23/20 0511  FERRITIN 1,697* 844*   Urine analysis:    Component Value Date/Time   COLORURINE YELLOW 03/16/2020 0300   APPEARANCEUR HAZY (A) 03/16/2020 0300   LABSPEC 1.030 03/16/2020 0300   PHURINE 5.0 03/16/2020 0300   GLUCOSEU NEGATIVE 03/16/2020 0300   HGBUR NEGATIVE 03/16/2020 0300   BILIRUBINUR NEGATIVE 03/16/2020 0300   KETONESUR NEGATIVE 03/16/2020 0300   PROTEINUR 100 (A) 03/16/2020 0300   UROBILINOGEN 0.2 05/24/2014 0548   NITRITE NEGATIVE 03/16/2020 0300   LEUKOCYTESUR TRACE (A) 03/16/2020 0300   Recent Results (from the past 240 hour(s))  SARS CORONAVIRUS 2 (TAT 6-24 HRS) Nasopharyngeal Nasopharyngeal Swab     Status: None   Collection Time: 03/16/20  4:32 AM   Specimen: Nasopharyngeal Swab  Result Value Ref Range Status   SARS Coronavirus 2 NEGATIVE NEGATIVE Final    Comment: (NOTE) SARS-CoV-2 target nucleic acids are NOT DETECTED.  The SARS-CoV-2 RNA is generally detectable in upper and lower respiratory specimens during the acute phase of infection. Negative results do not preclude SARS-CoV-2 infection, do not rule out co-infections with other pathogens, and should not be used as the sole basis for treatment or other patient management decisions. Negative results must be combined with clinical observations, patient history, and epidemiological information. The expected result is Negative.  Fact Sheet for Patients: SugarRoll.be  Fact Sheet for Healthcare Providers: https://www.woods-mathews.com/  This test is not yet approved or cleared by the [place] FDA and  has been authorized for  detection and/or diagnosis of SARS-CoV-2 by FDA under an Emergency Use Authorization (EUA). This EUA will remain  in effect (meaning this test can be used) for the duration of the COVID-19 declaration under Se ction 564(b)(1) of the Act, 21 U.S.C. section 360bbb-3(b)(1), unless the authorization is terminated or revoked sooner.  Performed at [facility], [place] [address] [place], [place] [address]       Radiology Studies: CT ANGIO HEAD W OR WO CONTRAST  Result Date: 03/22/2020  CLINICAL DATA:  Stroke/TIA EXAM: CT ANGIOGRAPHY HEAD AND NECK TECHNIQUE: Multidetector CT imaging of the head and neck was performed using the standard protocol during bolus administration of intravenous contrast. Multiplanar CT image reconstructions and MIPs were obtained to evaluate the vascular anatomy. Carotid stenosis measurements (when applicable) are obtained utilizing NASCET criteria, using the distal internal carotid diameter as the denominator. CONTRAST:  33mL OMNIPAQUE IOHEXOL 350 MG/ML SOLN COMPARISON:  MRI and CT March 21, 2020.  MRA 03/10/18. FINDINGS: CT HEAD FINDINGS Brain: Progressive edema in the left temporal lobe, compatible with evolving infarct. No substantial mass effect. No midline shift. Mild hyperdensity in the infarct, likely representing petechial hemorrhage. Small focus of hyperdensity in the region of the posterior left sylvian fissure (series 5, image 16), which may represent trace subarachnoid hemorrhage. Additional scattered white matter hypoattenuation, likely related to chronic microvascular ischemic disease. Generalized cerebral volume loss with ex vacuo ventricular dilation. No hydrocephalus. No mass lesion. Vascular: Calcific atherosclerosis. Further evaluated on CTA portion of exam Skull: No acute fracture. Sinuses: Sinuses are clear. Right greater than left mastoid effusions. Orbits: Unremarkable orbits. CTA NECK FINDINGS Aortic arch: Large vessel origins are patent. Right carotid  system: Small right internal carotid artery. There is moderate atherosclerotic narrowing of the internal carotid artery near the skull base. Left carotid system: Calcified and noncalcified atherosclerosis at the carotid bifurcation without evidence of greater than 50% narrowing. Vertebral arteries: Left dominant. Severe stenosis of the left vertebral artery origin. Otherwise, no significant stenosis (50% or greater) or occlusion. Skeleton: No acute findings. Moderate to severe degenerative disc disease at C5-C6 and C6-C7. Other neck: No mass or suspicious adenopathy. Upper chest: No acute findings. Review of the MIP images confirms the above findings CTA HEAD FINDINGS Anterior circulation: Bilateral cavernous and paraclinoid carotid calcific atherosclerosis. Moderate stenosis of the right paraclinoid ICA. Bilateral M1 MCAs are patent. Dominant left A1 ACA with absent right A1 ACA, similar to prior. The left A1 ACA and bilateral A2 ACAs are patent. Suspected occlusion versus high-grade stenosis of a proximal left M2 MCA branch (see series 11, image 275; series 12, images 103/104; and series 13, images). Additional multifocal mild to moderate stenoses of M2 MCA branches. Posterior circulation: No significant stenosis, proximal occlusion, aneurysm, or vascular malformation.w Venous sinuses: As permitted by contrast timing, patent. Review of the MIP images confirms the above findings IMPRESSION: 1. Evolving left temporal infarct without substantial mass effect. Small areas of hyperdensity within the infarct likely represent petechial hemorrhage. Additionally, there is a small focus of hyperdensity in the region of the posterior left sylvian fissure, possibly representing trace subarachnoid hemorrhage versus cortical petechial hemorrhage. 2. Suspected occlusion versus high-grade stenosis of a proximal left M2 MCA branch, as detailed above. 3. Severe stenosis of the left vertebral artery origin. 4. Moderate stenosis of  the right ICA at the skull base and the right paraclinoid ICA. Electronically Signed   By: Feliberto Harts MD   On: 03/22/2020 10:11   CT ANGIO NECK W OR WO CONTRAST  Result Date: 03/22/2020 CLINICAL DATA:  Stroke/TIA EXAM: CT ANGIOGRAPHY HEAD AND NECK TECHNIQUE: Multidetector CT imaging of the head and neck was performed using the standard protocol during bolus administration of intravenous contrast. Multiplanar CT image reconstructions and MIPs were obtained to evaluate the vascular anatomy. Carotid stenosis measurements (when applicable) are obtained utilizing NASCET criteria, using the distal internal carotid diameter as the denominator. CONTRAST:  40mL OMNIPAQUE IOHEXOL 350 MG/ML SOLN COMPARISON:  MRI and CT March 21, 2020.  MRA 03/10/18. FINDINGS: CT HEAD FINDINGS Brain: Progressive edema in the left temporal lobe, compatible with evolving infarct. No substantial mass effect. No midline shift. Mild hyperdensity in the infarct, likely representing petechial hemorrhage. Small focus of hyperdensity in the region of the posterior left sylvian fissure (series 5, image 16), which may represent trace subarachnoid hemorrhage. Additional scattered white matter hypoattenuation, likely related to chronic microvascular ischemic disease. Generalized cerebral volume loss with ex vacuo ventricular dilation. No hydrocephalus. No mass lesion. Vascular: Calcific atherosclerosis. Further evaluated on CTA portion of exam Skull: No acute fracture. Sinuses: Sinuses are clear. Right greater than left mastoid effusions. Orbits: Unremarkable orbits. CTA NECK FINDINGS Aortic arch: Large vessel origins are patent. Right carotid system: Small right internal carotid artery. There is moderate atherosclerotic narrowing of the internal carotid artery near the skull base. Left carotid system: Calcified and noncalcified atherosclerosis at the carotid bifurcation without evidence of greater than 50% narrowing. Vertebral arteries: Left  dominant. Severe stenosis of the left vertebral artery origin. Otherwise, no significant stenosis (50% or greater) or occlusion. Skeleton: No acute findings. Moderate to severe degenerative disc disease at C5-C6 and C6-C7. Other neck: No mass or suspicious adenopathy. Upper chest: No acute findings. Review of the MIP images confirms the above findings CTA HEAD FINDINGS Anterior circulation: Bilateral cavernous and paraclinoid carotid calcific atherosclerosis. Moderate stenosis of the right paraclinoid ICA. Bilateral M1 MCAs are patent. Dominant left A1 ACA with absent right A1 ACA, similar to prior. The left A1 ACA and bilateral A2 ACAs are patent. Suspected occlusion versus high-grade stenosis of a proximal left M2 MCA branch (see series 11, image 275; series 12, images 103/104; and series 13, images). Additional multifocal mild to moderate stenoses of M2 MCA branches. Posterior circulation: No significant stenosis, proximal occlusion, aneurysm, or vascular malformation.w Venous sinuses: As permitted by contrast timing, patent. Review of the MIP images confirms the above findings IMPRESSION: 1. Evolving left temporal infarct without substantial mass effect. Small areas of hyperdensity within the infarct likely represent petechial hemorrhage. Additionally, there is a small focus of hyperdensity in the region of the posterior left sylvian fissure, possibly representing trace subarachnoid hemorrhage versus cortical petechial hemorrhage. 2. Suspected occlusion versus high-grade stenosis of a proximal left M2 MCA branch, as detailed above. 3. Severe stenosis of the left vertebral artery origin. 4. Moderate stenosis of the right ICA at the skull base and the right paraclinoid ICA. Electronically Signed   By: Feliberto HartsFrederick S Jones MD   On: 03/22/2020 10:11   MR BRAIN WO CONTRAST  Result Date: 03/21/2020 CLINICAL DATA:  Initial evaluation for acute neuro deficit, stroke suspected, mental status change. EXAM: MRI HEAD  WITHOUT CONTRAST TECHNIQUE: Multiplanar, multiecho pulse sequences of the brain and surrounding structures were obtained without intravenous contrast. COMPARISON:  Prior head CT from earlier the same day. FINDINGS: Brain: Examination severely degraded by motion artifact, limiting assessment. Generalized age-related cerebral atrophy. Extensive patchy and confluent T2/FLAIR hyperintensity within the periventricular deep white matter both cerebral hemispheres most consistent with chronic small vessel ischemic disease, advanced in nature. Small remote lacunar infarcts noted at the right basal ganglia and possibly left basal ganglia and thalamus as well. Few additional remote lacunar infarcts at the right pons. Small focus of encephalomalacia at the paramedian left occipital lobe consistent with a chronic left PCA territory infarct. Additional chronic left ACA territory infarct present at the high parasagittal left frontal lobe. Moderate to large area of confluent restricted diffusion seen involving the left temporal lobe,  consistent with an acute left MCA distribution infarct. Patchy involvement of the insula. Posterior extension to involve the left parieto-occipital region. Suspected associated petechial hemorrhage, difficult to see on this exam, although is likely evident on prior CT. No frank hemorrhagic transformation or significant regional mass effect. No other foci of restricted diffusion to suggest acute or subacute ischemia. Gray-white matter differentiation otherwise maintained. No visible acute intracranial hemorrhage on this motion degraded exam. No mass lesion, midline shift, or significant mass effect. Diffuse ventricular prominence related to global parenchymal volume loss without hydrocephalus. No extra-axial fluid collection. Pituitary gland and suprasellar region normal. Midline structures intact. Vascular: Major intracranial vascular flow voids are grossly maintained at the skull base. Skull and upper  cervical spine: Craniocervical junction within normal limits. Bone marrow signal intensity normal. No scalp soft tissue abnormality. Sinuses/Orbits: Globes and orbital soft tissues grossly within normal limits. Mild mucosal thickening noted within the right maxillary sinus. Paranasal sinuses are otherwise clear. Bilateral mastoid effusions noted. Visualized nasopharynx grossly unremarkable. Other: None. IMPRESSION: 1. Technically limited exam due to extensive motion artifact. 2. Moderate to large sized acute left MCA distribution infarct, primarily involving the left temporal lobe as above. Suspected associated petechial hemorrhage without frank hemorrhagic transformation or significant regional mass effect. 3. Underlying age-related cerebral atrophy with advanced chronic microvascular ischemic disease, with superimposed chronic left PCA and ACA territory infarcts. Electronically Signed   By: Rise Mu M.D.   On: 03/21/2020 23:16   ECHOCARDIOGRAM LIMITED  Result Date: 03/21/2020    ECHOCARDIOGRAM LIMITED REPORT   Patient Name:   Brian Mclaughlin Date of Exam: 03/21/2020 Medical Rec #:  [id number]   Height:       72.0 in Accession #:    [id number]  Weight:       205.0 lb Date of Birth:  05-31-1945  BSA:          2.153 m Patient Age:    74 years    BP:           150/98 mmHg Patient Gender: M           HR:           67 bpm. Exam Location:  Inpatient Procedure: Limited Echo, Cardiac Doppler, Color Doppler and Intracardiac            Opacification Agent Indications:    CHF, pulmonary Embolus  History:        Patient has prior history of Echocardiogram examinations. CHF,                 Stroke, Arrythmias:Atrial Fibrillation; Risk                 Factors:Hypertension and Dyslipidemia. COVID +, CKD, alcohol                 abuse.  Sonographer:    Lavenia Atlas Referring Phys: [phone] [place] IMPRESSIONS  1. Left ventricular ejection fraction, by estimation, is 45 to 50%. The left ventricle has mildly decreased  function. The left ventricle demonstrates global hypokinesis. There is mild concentric left ventricular hypertrophy. Left ventricular diastolic function could not be evaluated.  2. Right ventricular systolic function is normal. The right ventricular size is normal. There is normal pulmonary artery systolic pressure.  3. The mitral valve is normal in structure. Mild mitral valve regurgitation. No evidence of mitral stenosis.  4. The aortic valve is normal in structure. Aortic valve regurgitation is mild. No aortic stenosis is present.  5. There is mild dilatation of the aortic root and at the level of the sinuses of Valsalva, measuring 45 mm.  6. The inferior vena cava is normal in size with greater than 50% respiratory variability, suggesting right atrial pressure of 3 mmHg. FINDINGS  Left Ventricle: Left ventricular ejection fraction, by estimation, is 45 to 50%. The left ventricle has mildly decreased function. The left ventricle demonstrates global hypokinesis. Definity contrast agent was given IV to delineate the left ventricular  endocardial borders. The left ventricular internal cavity size was normal in size. There is mild concentric left ventricular hypertrophy. Left ventricular diastolic function could not be evaluated. Left ventricular diastolic function could not be evaluated due to atrial fibrillation. Right Ventricle: The right ventricular size is normal. No increase in right ventricular wall thickness. Right ventricular systolic function is normal. There is normal pulmonary artery systolic pressure. The tricuspid regurgitant velocity is 2.21 m/s, and  with an assumed right atrial pressure of 3 mmHg, the estimated right ventricular systolic pressure is 22.5 mmHg. Left Atrium: Left atrial size was normal in size. Right Atrium: Right atrial size was normal in size. Pericardium: There is no evidence of pericardial effusion. Mitral Valve: The mitral valve is normal in structure. Mild mitral valve  regurgitation. No evidence of mitral valve stenosis. Tricuspid Valve: The tricuspid valve is normal in structure. Tricuspid valve regurgitation is mild . No evidence of tricuspid stenosis. Aortic Valve: The aortic valve is normal in structure. Aortic valve regurgitation is mild. Aortic regurgitation PHT measures 852 msec. No aortic stenosis is present. Pulmonic Valve: The pulmonic valve was normal in structure. Pulmonic valve regurgitation is not visualized. No evidence of pulmonic stenosis. Aorta: The aortic root is normal in size and structure. There is mild dilatation of the aortic root and at the level of the sinuses of Valsalva, measuring 45 mm. Venous: The inferior vena cava is normal in size with greater than 50% respiratory variability, suggesting right atrial pressure of 3 mmHg. IAS/Shunts: No atrial level shunt detected by color flow Doppler. LEFT VENTRICLE PLAX 2D LVIDd:         5.50 cm LVIDs:         4.00 cm LV PW:         1.30 cm LV IVS:        1.30 cm LVOT diam:     2.60 cm LV SV:         57 LV SV Index:   26 LVOT Area:     5.31 cm  LEFT ATRIUM           Index LA diam:      3.90 cm 1.81 cm/m LA Vol (A4C): 41.3 ml 19.18 ml/m  AORTIC VALVE LVOT Vmax:   59.40 cm/s LVOT Vmean:  37.800 cm/s LVOT VTI:    0.107 m AI PHT:      852 msec  AORTA Ao Root diam: 4.50 cm MITRAL VALVE               TRICUSPID VALVE MV Area (PHT): 2.87 cm    TR Peak grad:   19.5 mmHg MV Decel Time: 264 msec    TR Vmax:        221.00 cm/s MV E velocity: 49.70 cm/s MV A velocity: 27.00 cm/s  SHUNTS MV E/A ratio:  1.84        Systemic VTI:  0.11 m  Systemic Diam: 2.60 cm Armanda Magicraci Turner MD Electronically signed by Armanda Magicraci Turner MD Signature Date/Time: 03/21/2020/4:51:44 PM    Final     Scheduled Meds: .  stroke: mapping our early stages of recovery book   Does not apply Once  . aspirin  300 mg Rectal Daily  . dexamethasone (DECADRON) injection  6 mg Intravenous Daily  . sodium chloride flush  3 mL Intravenous  Q12H   Continuous Infusions: . sodium chloride Stopped (03/22/20 2055)  . azithromycin Stopped (03/22/20 2054)  . cefTRIAXone (ROCEPHIN)  IV Stopped (03/22/20 2054)  . remdesivir 100 mg in NS 100 mL 100 mg (03/23/20 1121)     LOS: 3 days   Time spent: 35 minutes  Tyrone Nineyan B Ilena Dieckman, MD [facility] [email or web address] 03/23/2020, 12:06 PM

## 2020-03-23 NOTE — Consult Note (Signed)
Consultation Note Date: 03/23/2020   Patient Name: Brian Mclaughlin  DOB: 1946/02/19  MRN: [id number]  Age / Sex: 75 y.o., male   PCP: Patient, No Pcp Per Referring Physician: Tyrone Nine, MD   REASON FOR CONSULTATION:Establishing goals of care  Palliative Care consult requested for goals of care discussion in this 75 y.o. male with multiple medical problems including hypertension, atrial fibrillation not on anticoagulation, chronic systolic heart failure, CKD 3b, alcohol abuse, and CVA. He presented to the ED initially on 03/06/20 wit complaints of dizziness and a fall onto his left hip. ED work-up did not show an acute fracture but was positive for a wedge L1 compression fracture. He was holding for SNF placement due to safety concerns of returning home. On 03/20/20 patient noted to have a persistent cough. Chest x-ray showed mild atelectasis. Subsequently patient tested positive for COVID-19. He was started on remdesivir. Mental status changed noted with decreased speech. Head CT suggested left- sided infarct and large MCA infarct confirmed by MRI.   Clinical Assessment and Goals of Care: I have reviewed medical records including lab results, imaging, Epic notes, and MAR, received report from the bedside RN, and assessed the patient. I spoke via phone with patient's daughter, Adarion Spaziano to discuss diagnosis prognosis, GOC, EOL wishes, disposition and options. Daughter also wanted to update and include her mother (patient's ex-wife) given they were still friends.   I introduced Palliative Medicine as specialized medical care for people living with serious illness. It focuses on providing relief from the symptoms and stress of a serious illness. The goal is to improve quality of life for both the patient and the family. Daughter verbalized understanding and appreciation.   We discussed a brief life review of the patient, along with his functional and nutritional status. Daughter resides in [place]  where patient is also originally from. He has been divorced for more than 10 years. [name] is the only child. Mr. Larna Daughters is a retired Personnel officer.   Prior to admission daughter reports patient's qualify of life had continued to decline (referring to poor health maintenance and previous strokes). She reports his speech was slow and slurred at times. She expresses sadness in patient not wanting to receive the COVID vaccine due to his girlfriend's beliefs. She reports she had recently arranged for him to relocate back to [place] with her however, under the condition he would have to receive the vaccine. [name] shares prior to admission she and mother visited patient and he was beginning to have frequent falls and unable to sufficiently care for himself.   We discussed His current illness and what it means in the larger context of His on-going co-morbidities. With specific discussions regarding his COVID virus, recent CVA, and his overall functional and nutritional decline. Natural disease trajectory and expectations at EOL were discussed.  [name] is tearful when verbalizing her understanding of this discussions and updates received by the provider team. She states she is aware of her father's poor prognosis and both she and her mother expressed they do not want to see him suffer.    I attempted to elicit values and goals of care important to the patient.    [name] reports she knows her father would not want to live in his current condition and has always expressed to her to make the right decisions for him if she ever needed to. She tearfully shares "in his debilitated state my father would want to be comfortable until he passes  away!" Support provided.  The difference between aggressive medical intervention and comfort care was considered in light of the patient's goals of care. I educated family on what comfort care measures would look like. Education provided on symptom management and possibilities of hospitalized  death vs at a facility. Family verbalizes understanding and reports they would like to transition all care to focus on Mr. Onder comfort during what time he has left.   [name] confirms wishes for DNR/DNI.   I discussed the visitation policy in the setting of COVID-19 and end-of-life. Family verbalized understanding. Daughter and ex-wife both declines visitation due to their own immunocompromised state despite receiving vaccine and daughter shares she does not wish to remember him in his current state of health. Daughter does offer permission to allow patient's friend Helmut Muster to visit and provided and update if she requests.   Hospice services outpatient were explained and offered. Family verbalized their understanding and awareness of hospice's goals and philosophy of care. Education provided on limited placement in the setting of COVID-19. Daughter verbalized understanding.   Questions and concerns were addressed. The family was encouraged to call with questions or concerns.  PMT will continue to support holistically.   SOCIAL HISTORY:     reports that he has quit smoking. His smoking use included cigarettes. He has a 22.50 pack-year smoking history. He has never used smokeless tobacco. He reports current alcohol use. He reports that he does not use drugs.  CODE STATUS: DNR  ADVANCE DIRECTIVES: Primary Decision Maker: Lonny Prude (daughter)    SYMPTOM MANAGEMENT:see below   Palliative Prophylaxis:   Aspiration, Eye Care, Frequent Pain Assessment, Oral Care and Turn Reposition  PSYCHO-SOCIAL/SPIRITUAL:  Support System: Family Desire for further Chaplaincy support: No   Additional Recommendations (Limitations, Scope, Preferences):  Full Comfort Care  Education on hospice   PAST MEDICAL HISTORY: Past Medical History:  Diagnosis Date  . Abnormality of gait 06/14/2014  . Acute CVA (cerebrovascular accident) (HCC) 05/24/2014  . Acute kidney failure, unspecified (HCC)   . Acute, but  ill-defined, cerebrovascular disease   . Alcohol abuse   . Altered mental status   . Atrial fibrillation (HCC)   . Chronic systolic CHF (congestive heart failure) (HCC)   . CVA (cerebral infarction)   . ETOH abuse   . Hyperlipidemia   . Hypertension   . Nonspecific elevation of levels of transaminase or lactic acid dehydrogenase (LDH)   . Rhabdomyolysis   . Tobacco abuse   . Tobacco use disorder     ALLERGIES:  is allergic to codeine and penicillins.   MEDICATIONS:  Current Facility-Administered Medications  Medication Dose Route Frequency Provider Last Rate Last Admin  .  stroke: mapping our early stages of recovery book   Does not apply Once Rejeana Brock, MD      . 0.9 %  sodium chloride infusion  250 mL Intravenous Continuous Jimmye Norman, NP   Stopped at 03/22/20 2055  . aspirin suppository 300 mg  300 mg Rectal Daily Rejeana Brock, MD   300 mg at 03/23/20 1433  . azithromycin (ZITHROMAX) 500 mg in sodium chloride 0.9 % 250 mL IVPB  500 mg Intravenous Q24H Jae Dire, MD   Stopped at 03/22/20 2054  . cefTRIAXone (ROCEPHIN) 2 g in sodium chloride 0.9 % 100 mL IVPB  2 g Intravenous Q24H Jae Dire, MD   Stopped at 03/22/20 2054  . dexamethasone (DECADRON) injection 6 mg  6 mg Intravenous Daily Whitney Post  E, MD   6 mg at 03/23/20 1122  . guaiFENesin-dextromethorphan (ROBITUSSIN DM) 100-10 MG/5ML syrup 10 mL  10 mL Oral Q4H PRN Jae Dire, MD      . Ipratropium-Albuterol (COMBIVENT) respimat 1 puff  1 puff Inhalation Q6H PRN Jae Dire, MD      . remdesivir 100 mg in sodium chloride 0.9 % 100 mL IVPB  100 mg Intravenous Daily Jae Dire, MD 200 mL/hr at 03/23/20 1121 100 mg at 03/23/20 1121  . sodium chloride flush (NS) 0.9 % injection 3 mL  3 mL Intravenous Q12H Jae Dire, MD   3 mL at 03/23/20 1125    VITAL SIGNS: BP (!) 160/90 (BP Location: Left Arm)   Pulse 76   Temp 97.6 F (36.4 C) (Axillary)   Resp 16   Ht 6'  (1.829 m)   Wt 97.8 kg   SpO2 100%   BMI 29.24 kg/m  Filed Weights   03/16/20 2000 03/22/20 2240 03/23/20 0500  Weight: 93 kg 97.8 kg 97.8 kg    Estimated body mass index is 29.24 kg/m as calculated from the following:   Height as of this encounter: 6' (1.829 m).   Weight as of this encounter: 97.8 kg.  LABS: CBC:    Component Value Date/Time   WBC 12.0 (H) 03/23/2020 0511   HGB 9.1 (L) 03/23/2020 0511   HCT 27.7 (L) 03/23/2020 0511   PLT 112 (L) 03/23/2020 0511   Comprehensive Metabolic Panel:    Component Value Date/Time   NA 138 03/23/2020 0511   K 4.5 03/23/2020 0511   BUN 64 (H) 03/23/2020 0511   CREATININE 2.14 (H) 03/23/2020 0511   ALBUMIN 2.6 (L) 03/23/2020 0511     Review of Systems  Unable to perform ROS: Acuity of condition   The above conversation was completed via telephone due to the visitor restrictions during the COVID-19 pandemic. Thorough chart review and discussion with necessary members of the care team was completed as part of assessment. All issues were discussed and addressed but no physical exam was performed.  Prognosis: Poor: weeks in the setting of COVID-19, alcohol abuse, CHF, atrial fibrillation, CKD3, falls, hypotension, acute left MCA stroke with significant debility, dysphagia, and failure to thrive.   Discharge Planning:  Hospice facility vs hospital death.   Recommendations: . DNR/DNI-as confirmed by daughter, [name] . Transition all care to focus on comfort/EOL . Daughter requesting comfort care and no further aggressive interventions. Declines visitation due to her own immunocompromised state. Does offer allowance for patient's friend Helmut Muster to visit and receive an update if she calls.  . Education provided on full comfort/hospice. Family is not planning to visit and is open to any facility offering placement with understanding of limited options in the setting of COVID-19.  [facility]. Mrs. Nelida Gores, [place] is aware with  suggestions for [facility] or [place] due COVID positive.  Morphine PRN for pain/air hunger/comfort Robinul PRN for excessive secretions Ativan PRN for agitation/anxiety Zofran PRN for nausea Liquifilm tears PRN for dry eyes Haldol PRN for agitation/anxiety May have comfort feeding Unrestricted visitations in the setting of EOL (per policy) Oxygen PRN 2L or less for comfort. No escalation.  [facility] PMT will continue to support and follow. Please call team line with urgent needs.   Palliative Performance Scale:PPS 10-20%              Daughter, [name] expressed understanding and was in agreement with this plan.  Thank you for allowing the Palliative Medicine Team to assist in the care of this patient.  Time In: 1500 Time Out: 1605 Time Total: 65 min.   Visit consisted of counseling and education dealing with the complex and emotionally intense issues of symptom management and palliative care in the setting of serious and potentially life-threatening illness.Greater than 50%  of this time was spent counseling and coordinating care related to the above assessment and plan.  Signed by:  Willette Alma, AGPCNP-BC Palliative Medicine Team  Phone: [phone] Pager: [phone] Amion: Thea Alken

## 2020-03-23 NOTE — Evaluation (Addendum)
Clinical/Bedside Swallow Evaluation Patient Details  Name: Brian Mclaughlin MRN: [id number] Date of Birth: 28-Oct-1945  Today's Date: 03/23/2020 Time: SLP Start Time (ACUTE ONLY): 1530 SLP Stop Time (ACUTE ONLY): 1601 SLP Time Calculation (min) (ACUTE ONLY): 31 min  Past Medical History:  Past Medical History:  Diagnosis Date  . Abnormality of gait 06/14/2014  . Acute CVA (cerebrovascular accident) (HCC) 05/24/2014  . Acute kidney failure, unspecified (HCC)   . Acute, but ill-defined, cerebrovascular disease   . Alcohol abuse   . Altered mental status   . Atrial fibrillation (HCC)   . Chronic systolic CHF (congestive heart failure) (HCC)   . CVA (cerebral infarction)   . ETOH abuse   . Hyperlipidemia   . Hypertension   . Nonspecific elevation of levels of transaminase or lactic acid dehydrogenase (LDH)   . Rhabdomyolysis   . Tobacco abuse   . Tobacco use disorder    Past Surgical History:  Past Surgical History:  Procedure Laterality Date  . BACK SURGERY  10 years ago   "slipped disc" repair  . EYE MUSCLE SURGERY     as a teenager "tighten his muscles"   HPI:  AFib, chronic HFrEF, stage IIIb CKD, CVA w/dysarthria, alcohol abuse, and HTN who presented to the ED initially 03/16/2020 with dizziness and fall onto left hip at home. Comprehensive imaging at that time revealed only a wedge compression fracture at L1. He was kept in the ED awaiting SNF bed until he was noted to be coughing consistently, worse with per oral intake on 03/20/2020. Aspiration was suspected and SARS-CoV-2 PCR was positive (had been negative 12/31).   Pt found to have large Left MCA CVA, Imaging of chest showed ? chronic right pulmonary artery PE, mild LLL consolidation with assoc ATX/infiltration and small effusion.  Swallow and speech eval ordered.   Uncertain to level of aphasia pt had prior to this event - prior CVA 2016.    Assessment / Plan / Recommendation Clinical Impression  Patient currently presents with  clinical indications concerning for aspiration c/b delayed swallowing across all boluses, increased congestion heard suspected near pharynx/larynx with intake after swallow with all boluses and subtle cough post-swallow of thin liquids. He did not follow directions for oral motor exam and demonstrates decreased labial closure on the right.  SLP set up oral suction for use with pt if emergently needed.  Pt indicated he was "thirsty" - repeating "thirsty" but not hungry during evaluation.  Due to overt increase in congestion post-swallowing, did not provide pt more than a few boluses. Anticipate his intake to be only a few sips, tsps, or tastes of popsicle, etc as his risk of aspiration and high and increased congestion does not appear comfortable for pt.  Per RN, pt is now comfort care - and thus will leave information for family re: comfort feeding.  No family present at this time.  Will sign off. SLP Visit Diagnosis: Dysphagia, oropharyngeal phase (R13.12)    Aspiration Risk  Risk for inadequate nutrition/hydration;Severe aspiration risk    Diet Recommendation Other (Comment) (comfort po - sips, tsps, taste of popsicles etc)   Liquid Administration via: Spoon Supervision: Full supervision/cueing for compensatory strategies Compensations: Slow rate;Small sips/bites Postural Changes: Seated upright at 90 degrees    Other  Recommendations Oral Care Recommendations: Oral care QID   Follow up Recommendations None      Frequency and Duration     n/a       Prognosis Prognosis for Safe Diet  Advancement: Guarded      Swallow Study   General HPI: AFib, chronic HFrEF, stage IIIb CKD, CVA w/dysarthria, alcohol abuse, and HTN who presented to the ED initially 03/16/2020 with dizziness and fall onto left hip at home. Comprehensive imaging at that time revealed only a wedge compression fracture at L1. He was kept in the ED awaiting SNF bed until he was noted to be coughing consistently, worse with per  oral intake on 03/20/2020. Aspiration was suspected and SARS-CoV-2 PCR was positive (had been negative 12/31).   Pt found to have large Left MCA CVA, Imaging of chest showed ? chronic right pulmonary artery PE, mild LLL consolidation with assoc ATX/infiltration and small effusion.  Swallow and speech eval ordered. Type of Study: Bedside Swallow Evaluation Previous Swallow Assessment: MBS 05/26/2014 rec dys1/nectar thick liquids, silent trace aspiration of thin, penetration of nectar - high in larynx, oropharyngeal delay Diet Prior to this Study: NPO Temperature Spikes Noted: No Respiratory Status: Room air History of Recent Intubation: No Behavior/Cognition: Alert;Doesn't follow directions Oral Cavity Assessment: Within Functional Limits Oral Care Completed by SLP: No Vision: Functional for self-feeding Self-Feeding Abilities: Total assist Patient Positioning: Upright in bed Baseline Vocal Quality: Low vocal intensity Volitional Cough: Cognitively unable to elicit Volitional Swallow: Unable to elicit    Oral/Motor/Sensory Function Overall Oral Motor/Sensory Function: Severe impairment Facial ROM: Reduced right Facial Symmetry: Abnormal symmetry right Facial Strength: Reduced right Lingual Symmetry: Abnormal symmetry right Lingual Strength: Reduced Velum: Other (comment) (difficult to view but appeared bilateral) Mandible: Other (Comment) (DNT)   Ice Chips Ice chips: Impaired Presentation: Spoon Oral Phase Impairments: Reduced labial seal;Reduced lingual movement/coordination Oral Phase Functional Implications: Prolonged oral transit Pharyngeal Phase Impairments: Suspected delayed Swallow Other Comments: increased sounds of congested breathing at pharynx and suspect larynx, concerning for infiltration of ice into larynx   Thin Liquid Thin Liquid: Impaired Presentation: Straw;Self Fed;Spoon Oral Phase Impairments: Reduced labial seal;Reduced lingual movement/coordination Pharyngeal   Phase Impairments: Suspected delayed Swallow;Cough - Immediate Other Comments: Increased congested breathing heard near pharynx, larynx - with subtle cough - concerning for airway infiltration    Nectar Thick Nectar Thick Liquid: Not tested   Honey Thick Honey Thick Liquid: Not tested   Puree Puree: Impaired Presentation: Spoon Oral Phase Impairments: Reduced labial seal Oral Phase Functional Implications: Prolonged oral transit Pharyngeal Phase Impairments: Suspected delayed Swallow Other Comments: Increased sounds of congested breathing appeared near pharynx, larynx - pt did not desire more than one bite - suspect intake may not be comfortable for this pt at this time   Solid     Solid: Not tested Other Comments: Did not test due to pt's current level of dysphagia and concern for discomfort      Chales Abrahams 03/23/2020,5:24 PM  Rolena Infante, MS [facility] SLP Acute Rehab Services Office [phone] Pager [phone]  Evaluations were completed prior to discontinuing orders.

## 2020-03-23 NOTE — TOC Progression Note (Signed)
Transition of Care [facility]) - Progression Note    Patient Details  Name: Brian Mclaughlin MRN: [id number] Date of Birth: 04-10-1945  Transition of Care [facility]) CM/SW Contact  Geni Bers, RN Phone Number: 03/23/2020, 3:47 PM  Clinical Narrative:    Pt is now FULL Comfort Care.    Expected Discharge Plan: Skilled Nursing Facility Barriers to Discharge: No Barriers Identified  Expected Discharge Plan and Services Expected Discharge Plan: Skilled Nursing Facility       Living arrangements for the past 2 months: Single Family Home                                       Social Determinants of Health (SDOH) Interventions    Readmission Risk Interventions Readmission Risk Prevention Plan 07/29/2019  Transportation Screening Complete  PCP or Specialist Appt within 5-7 Days Patient refused  Home Care Screening Patient refused  Medication Review (RN CM) Referral to Pharmacy  Some recent data might be hidden

## 2020-03-23 NOTE — TOC Progression Note (Signed)
Transition of Care [facility]) - Progression Note    Patient Details  Name: Brian Mclaughlin MRN: [id number] Date of Birth: 1946/02/03  Transition of Care [facility]) CM/SW Contact  Geni Bers, RN Phone Number: 03/23/2020, 3:32 PM  Clinical Narrative:     TOC will follow for discharge needs.    Expected Discharge Plan: Skilled Nursing Facility Barriers to Discharge: No Barriers Identified  Expected Discharge Plan and Services Expected Discharge Plan: Skilled Nursing Facility       Living arrangements for the past 2 months: Single Family Home                                       Social Determinants of Health (SDOH) Interventions    Readmission Risk Interventions Readmission Risk Prevention Plan 07/29/2019  Transportation Screening Complete  PCP or Specialist Appt within 5-7 Days Patient refused  Home Care Screening Patient refused  Medication Review (RN CM) Referral to Pharmacy  Some recent data might be hidden

## 2020-03-23 NOTE — Progress Notes (Signed)
OT Cancellation Note  Patient Details Name: Brian Mclaughlin MRN: [id number] DOB: [birth year]   Cancelled Treatment:    Reason Eval/Treat Not Completed: Patient declined, no reason specified. Patient has been transitioned to full comfort care and OT order discontinued.  [name] [name] 03/23/2020, 4:11 PM

## 2020-03-24 DIAGNOSIS — I482 Chronic atrial fibrillation, unspecified: Secondary | ICD-10-CM | POA: Diagnosis not present

## 2020-03-24 DIAGNOSIS — I63512 Cerebral infarction due to unspecified occlusion or stenosis of left middle cerebral artery: Secondary | ICD-10-CM | POA: Diagnosis not present

## 2020-03-24 DIAGNOSIS — R296 Repeated falls: Secondary | ICD-10-CM

## 2020-03-24 DIAGNOSIS — F1010 Alcohol abuse, uncomplicated: Secondary | ICD-10-CM | POA: Diagnosis not present

## 2020-03-24 NOTE — Plan of Care (Signed)
  Problem: Clinical Measurements: Goal: Will remain free from infection Outcome: Progressing   Problem: Coping: Goal: Level of anxiety will decrease Outcome: Progressing   Problem: Pain Managment: Goal: General experience of comfort will improve Outcome: Progressing   Problem: Safety: Goal: Ability to remain free from injury will improve Outcome: Progressing   Problem: Skin Integrity: Goal: Risk for impaired skin integrity will decrease Outcome: Progressing   

## 2020-03-24 NOTE — Progress Notes (Signed)
   Daily Progress Note   Patient Name: Brian Mclaughlin       Date: 03/24/2020 DOB: 14-Jun-1945  Age: 75 y.o. MRN#: [id number] Attending Physician: Tyrone Nine, MD Primary Care Physician: Patient, No Pcp Per Admit Date: 03/16/2020  Reason for Consultation/Follow-up: Non pain symptom management, Pain control, Psychosocial/spiritual support and Terminal Care  Chart reviewed and Updates received by RN.   Continue comfort care measures with anticipation of hospice placement if accepted.RN reports patient was able to tolerate some juice and ensure this morning.  Daughter aware of current plan and patient is comfortable in no distress.   Length of Stay: 4 days  Vital Signs: BP (!) 145/93 (BP Location: Left Arm)   Pulse 71   Temp 97.6 F (36.4 C) (Oral)   Resp 14   Ht 6' (1.829 m)   Wt 97.8 kg   SpO2 100%   BMI 29.24 kg/m  SpO2: SpO2: 100 % O2 Device: O2 Device: Room Air O2 Flow Rate: O2 Flow Rate (L/min): 4 L/min       Palliative Care Assessment & Plan  HPI:  Palliative Care consult requested for goals of care discussion in this 75 y.o. male with multiple medical problems including hypertension, atrial fibrillation not on anticoagulation, chronic systolic heart failure, CKD 3b, alcohol abuse, and CVA. He presented to the ED initially on 03/06/20 wit complaints of dizziness and a fall onto his left hip. ED work-up did not show an acute fracture but was positive for a wedge L1 compression fracture. He was holding for SNF placement due to safety concerns of returning home. On 03/20/20 patient noted to have a persistent cough. Chest x-ray showed mild atelectasis. Subsequently patient tested positive for COVID-19. He was started on remdesivir. Mental status changed noted with decreased speech. Head CT suggested left- sided infarct and large MCA infarct confirmed by MRI.   Code Status:  DNR  Goals of Care/Recommendations:  Continue comfort care measures.   Pending hospice placement.    PMT will continue to support and follow as needed.   Prognosis: POOR  Discharge Planning: Hospice facility  Thank you for allowing the Palliative Medicine Team to assist in the care of this patient.  Time Total: 20 min.   Visit consisted of counseling and education dealing with the complex and emotionally intense issues of symptom management and palliative care in the setting of serious and potentially life-threatening illness.Greater than 50%  of this time was spent counseling and coordinating care related to the above assessment and plan.  Brian Mclaughlin, AGPCNP-BC  Palliative Medicine Team [phone]

## 2020-03-24 NOTE — Discharge Summary (Signed)
Physician Discharge Summary  Brian Mclaughlin ZOX:[id number] DOB: 1945-05-09 DOA: 03/16/2020  PCP: Patient, No Pcp Per  Admit date: 03/16/2020 Discharge date: 03/24/2020  Admitted From: Home Disposition: Residential hospice   Recommendations for Outpatient Follow-up:  1. Comfort measures  Equipment/Devices: None Discharge Condition: Stable for discharge to hospice; guarded prognosis CODE STATUS: DNR Diet recommendation: Comfort feeds allowable, severe aspiration risk  Brief/Interim Summary: Brian Mclaughlin is a 75 y.o. male with a history of AFib, chronic HFrEF, stage IIIb CKD, CVA w/dysarthria, alcohol abuse, and HTN who presented to the ED initially 03/16/2020 with dizziness and fall onto left hip at home. Comprehensive imaging at that time revealed only a wedge compression fracture at L1. He was kept in the ED awaiting SNF bed until he was noted to be coughing consistently, worse with per oral intake on 03/20/2020. Aspiration was suspected and SARS-CoV-2 PCR was positive (had been negative 12/31). CXR revealed mild left base atelectasis on background of cardiomegaly. CRP was found to be 12.8, though WBC remained wnl. PCT 0.11, d-dimer 19.24. Subsequent CTA chest evaluation was limited by artifact with suspicion for chronic PE in right pulmonary artery, otherwise inconclusive. Also noted was mild posterior LLL consolidation with associated atelectasis/infiltrate and very small effusion. Heparin was started in addition to remdesivir, ceftriaxone, azithromycin, and decadron.   Unfortunately the patient developed significant aphasia and right sided hemiparesis/extinction with neuroimaging confirming fairly large left MCA infarct with proximal M2 branch stenosis or occlusion. Neurology was consulted. Due to severe debility and severe aspiration risk based on speech therapy evaluation, palliative care was consulted. Discussions with the patient's family members confirmed his previously declared desire not to  have artificial support/feeding in a scenario such as this. Hospice placement is being pursued and comfort feeding is allowed.   Discharge Diagnoses:  Principal Problem:   COVID-19 virus infection Active Problems:   HTN (hypertension)   Alcohol abuse   Chronic systolic CHF (congestive heart failure) (HCC)   Chronic atrial fibrillation (HCC)   Chronic kidney disease, stage 3b (HCC)   Mixed hyperlipidemia   Acute encephalopathy   Fall   Failure to thrive in adult   Acute cerebrovascular accident (CVA) due to occlusion of left middle cerebral artery (HCC)   Pressure injury of skin  RN Pressure Injury Documentation: Pressure Injury 03/23/20 Sacrum Right;Left Stage 1 -  Intact skin with non-blanchable redness of a localized area usually over a bony prominence. (Active)  03/23/20 0113  Location: Sacrum  Location Orientation: Right;Left  Staging: Stage 1 -  Intact skin with non-blanchable redness of a localized area usually over a bony prominence.  Wound Description (Comments):   Present on Admission: Yes   Discharge Instructions  Allergies as of 03/24/2020      Reactions   Codeine Rash   Penicillins Rash      Medication List    STOP taking these medications   atorvastatin 20 MG tablet Commonly known as: LIPITOR   carvedilol 3.125 MG tablet Commonly known as: COREG   thiamine 100 MG tablet       Allergies  Allergen Reactions  . Codeine Rash  . Penicillins Rash    Consultations:  Neurology  Palliative care  Procedures/Studies: CT ANGIO HEAD W OR WO CONTRAST  Result Date: 03/22/2020 CLINICAL DATA:  Stroke/TIA EXAM: CT ANGIOGRAPHY HEAD AND NECK TECHNIQUE: Multidetector CT imaging of the head and neck was performed using the standard protocol during bolus administration of intravenous contrast. Multiplanar CT image reconstructions and MIPs were obtained  to evaluate the vascular anatomy. Carotid stenosis measurements (when applicable) are obtained utilizing NASCET  criteria, using the distal internal carotid diameter as the denominator. CONTRAST:  58mL OMNIPAQUE IOHEXOL 350 MG/ML SOLN COMPARISON:  MRI and CT March 21, 2020.  MRA 03/10/18. FINDINGS: CT HEAD FINDINGS Brain: Progressive edema in the left temporal lobe, compatible with evolving infarct. No substantial mass effect. No midline shift. Mild hyperdensity in the infarct, likely representing petechial hemorrhage. Small focus of hyperdensity in the region of the posterior left sylvian fissure (series 5, image 16), which may represent trace subarachnoid hemorrhage. Additional scattered white matter hypoattenuation, likely related to chronic microvascular ischemic disease. Generalized cerebral volume loss with ex vacuo ventricular dilation. No hydrocephalus. No mass lesion. Vascular: Calcific atherosclerosis. Further evaluated on CTA portion of exam Skull: No acute fracture. Sinuses: Sinuses are clear. Right greater than left mastoid effusions. Orbits: Unremarkable orbits. CTA NECK FINDINGS Aortic arch: Large vessel origins are patent. Right carotid system: Small right internal carotid artery. There is moderate atherosclerotic narrowing of the internal carotid artery near the skull base. Left carotid system: Calcified and noncalcified atherosclerosis at the carotid bifurcation without evidence of greater than 50% narrowing. Vertebral arteries: Left dominant. Severe stenosis of the left vertebral artery origin. Otherwise, no significant stenosis (50% or greater) or occlusion. Skeleton: No acute findings. Moderate to severe degenerative disc disease at C5-C6 and C6-C7. Other neck: No mass or suspicious adenopathy. Upper chest: No acute findings. Review of the MIP images confirms the above findings CTA HEAD FINDINGS Anterior circulation: Bilateral cavernous and paraclinoid carotid calcific atherosclerosis. Moderate stenosis of the right paraclinoid ICA. Bilateral M1 MCAs are patent. Dominant left A1 ACA with absent right A1  ACA, similar to prior. The left A1 ACA and bilateral A2 ACAs are patent. Suspected occlusion versus high-grade stenosis of a proximal left M2 MCA branch (see series 11, image 275; series 12, images 103/104; and series 13, images). Additional multifocal mild to moderate stenoses of M2 MCA branches. Posterior circulation: No significant stenosis, proximal occlusion, aneurysm, or vascular malformation.w Venous sinuses: As permitted by contrast timing, patent. Review of the MIP images confirms the above findings IMPRESSION: 1. Evolving left temporal infarct without substantial mass effect. Small areas of hyperdensity within the infarct likely represent petechial hemorrhage. Additionally, there is a small focus of hyperdensity in the region of the posterior left sylvian fissure, possibly representing trace subarachnoid hemorrhage versus cortical petechial hemorrhage. 2. Suspected occlusion versus high-grade stenosis of a proximal left M2 MCA branch, as detailed above. 3. Severe stenosis of the left vertebral artery origin. 4. Moderate stenosis of the right ICA at the skull base and the right paraclinoid ICA. Electronically Signed   By: Feliberto Harts MD   On: 03/22/2020 10:11   DG Chest 1 View  Result Date: 03/20/2020 CLINICAL DATA:  Failure to thrive.  Coughing. EXAM: CHEST  1 VIEW COMPARISON:  None. FINDINGS: Cardiomegaly. Chronic aortic atherosclerotic calcification. Right lung is clear. Question mild atelectasis at the left lung base. Evidence of heart failure or effusion. IMPRESSION: Question mild atelectasis at the left lung base. Cardiomegaly. Aortic atherosclerosis. Electronically Signed   By: Paulina Fusi M.D.   On: 03/20/2020 11:11   DG Lumbar Spine Complete  Result Date: 03/16/2020 CLINICAL DATA:  Fall EXAM: LUMBAR SPINE - COMPLETE 4+ VIEW COMPARISON:  None. FINDINGS: There is a wedge compression fracture of L1. Multilevel mild height loss and facet arthrosis. Alignment is normal. Calcific aortic  atherosclerosis. IMPRESSION: Wedge compression fracture of L1, age  indeterminate. CT would be helpful for further characterization. Electronically Signed   By: Deatra Robinson M.D.   On: 03/16/2020 02:10   CT HEAD WO CONTRAST  Result Date: 03/21/2020 CLINICAL DATA:  Altered mental status.  COVID-19 positive. EXAM: CT HEAD WITHOUT CONTRAST TECHNIQUE: Contiguous axial images were obtained from the base of the skull through the vertex without intravenous contrast. COMPARISON:  CT head dated March 16, 2020. FINDINGS: Brain: New hypodensity in the left temporal lobe with loss of the gray-white matter differentiation and sulcal effacement. Chronic infarcts in the left superior frontal gyrus, left parietal lobe, and left occipital lobe again noted. Chronic lacunar infarcts in the right basal ganglia, right corona radiata, right pons, and left cerebellum again noted. Unchanged moderate atrophy and severe chronic microvascular ischemic changes. Vascular: Calcified atherosclerosis at the skullbase. No hyperdense vessel. Skull: Normal. Negative for fracture or focal lesion. Sinuses/Orbits: No acute finding. Chronic opacification of the right mastoid air cells. Other: None. IMPRESSION: 1. New hypodensity in the left temporal lobe with loss of the gray-white matter differentiation and sulcal effacement, concerning for acute to subacute infarct. 2. Unchanged atrophy, severe chronic microvascular ischemic changes, and multiple chronic infarcts. Electronically Signed   By: Obie Dredge M.D.   On: 03/21/2020 12:16   CT Head Wo Contrast  Result Date: 03/16/2020 CLINICAL DATA:  Fall EXAM: CT HEAD WITHOUT CONTRAST TECHNIQUE: Contiguous axial images were obtained from the base of the skull through the vertex without intravenous contrast. COMPARISON:  None. FINDINGS: Brain: No evidence of acute territorial infarction, hemorrhage, hydrocephalus,extra-axial collection or mass lesion/mass effect. There is dilatation the  ventricles and sulci consistent with age-related atrophy. Low-attenuation changes in the deep white matter consistent with small vessel ischemia. Vascular: No hyperdense vessel or unexpected calcification. Skull: The skull is intact. No fracture or focal lesion identified. Sinuses/Orbits: The visualized paranasal sinuses and mastoid air cells are clear. The orbits and globes intact. Other: None Cervical spine: Alignment: There is straightening of the normal cervical lordosis. Skull base and vertebrae: Visualized skull base is intact. No atlanto-occipital dissociation. The vertebral body heights are well maintained. No fracture or pathologic osseous lesion seen. Soft tissues and spinal canal: The visualized paraspinal soft tissues are unremarkable. No prevertebral soft tissue swelling is seen. The spinal canal is grossly unremarkable, no large epidural collection or significant canal narrowing. Disc levels: Multilevel cervical spine spondylosis is seen with disc osteophyte complex and uncovertebral osteophytes with anterior osteophytes present. This is most notable at C5-C6 with severe left neural foraminal narrowing and mild to moderate central canal stenosis. Upper chest: The lung apices are clear. Thoracic inlet is within normal limits. Other: None IMPRESSION: No acute intracranial abnormality. Findings consistent with age related atrophy and chronic small vessel ischemia No acute fracture or malalignment of the spine. Electronically Signed   By: Jonna Clark M.D.   On: 03/16/2020 01:49   CT ANGIO NECK W OR WO CONTRAST  Result Date: 03/22/2020 CLINICAL DATA:  Stroke/TIA EXAM: CT ANGIOGRAPHY HEAD AND NECK TECHNIQUE: Multidetector CT imaging of the head and neck was performed using the standard protocol during bolus administration of intravenous contrast. Multiplanar CT image reconstructions and MIPs were obtained to evaluate the vascular anatomy. Carotid stenosis measurements (when applicable) are obtained  utilizing NASCET criteria, using the distal internal carotid diameter as the denominator. CONTRAST:  80mL OMNIPAQUE IOHEXOL 350 MG/ML SOLN COMPARISON:  MRI and CT March 21, 2020.  MRA 03/10/18. FINDINGS: CT HEAD FINDINGS Brain: Progressive edema in the left temporal  lobe, compatible with evolving infarct. No substantial mass effect. No midline shift. Mild hyperdensity in the infarct, likely representing petechial hemorrhage. Small focus of hyperdensity in the region of the posterior left sylvian fissure (series 5, image 16), which may represent trace subarachnoid hemorrhage. Additional scattered white matter hypoattenuation, likely related to chronic microvascular ischemic disease. Generalized cerebral volume loss with ex vacuo ventricular dilation. No hydrocephalus. No mass lesion. Vascular: Calcific atherosclerosis. Further evaluated on CTA portion of exam Skull: No acute fracture. Sinuses: Sinuses are clear. Right greater than left mastoid effusions. Orbits: Unremarkable orbits. CTA NECK FINDINGS Aortic arch: Large vessel origins are patent. Right carotid system: Small right internal carotid artery. There is moderate atherosclerotic narrowing of the internal carotid artery near the skull base. Left carotid system: Calcified and noncalcified atherosclerosis at the carotid bifurcation without evidence of greater than 50% narrowing. Vertebral arteries: Left dominant. Severe stenosis of the left vertebral artery origin. Otherwise, no significant stenosis (50% or greater) or occlusion. Skeleton: No acute findings. Moderate to severe degenerative disc disease at C5-C6 and C6-C7. Other neck: No mass or suspicious adenopathy. Upper chest: No acute findings. Review of the MIP images confirms the above findings CTA HEAD FINDINGS Anterior circulation: Bilateral cavernous and paraclinoid carotid calcific atherosclerosis. Moderate stenosis of the right paraclinoid ICA. Bilateral M1 MCAs are patent. Dominant left A1 ACA with  absent right A1 ACA, similar to prior. The left A1 ACA and bilateral A2 ACAs are patent. Suspected occlusion versus high-grade stenosis of a proximal left M2 MCA branch (see series 11, image 275; series 12, images 103/104; and series 13, images). Additional multifocal mild to moderate stenoses of M2 MCA branches. Posterior circulation: No significant stenosis, proximal occlusion, aneurysm, or vascular malformation.w Venous sinuses: As permitted by contrast timing, patent. Review of the MIP images confirms the above findings IMPRESSION: 1. Evolving left temporal infarct without substantial mass effect. Small areas of hyperdensity within the infarct likely represent petechial hemorrhage. Additionally, there is a small focus of hyperdensity in the region of the posterior left sylvian fissure, possibly representing trace subarachnoid hemorrhage versus cortical petechial hemorrhage. 2. Suspected occlusion versus high-grade stenosis of a proximal left M2 MCA branch, as detailed above. 3. Severe stenosis of the left vertebral artery origin. 4. Moderate stenosis of the right ICA at the skull base and the right paraclinoid ICA. Electronically Signed   By: Feliberto Harts MD   On: 03/22/2020 10:11   CT ANGIO CHEST PE W OR WO CONTRAST  Result Date: 03/20/2020 CLINICAL DATA:  Shortness of breath.  COVID positive. EXAM: CT ANGIOGRAPHY CHEST WITH CONTRAST TECHNIQUE: Multidetector CT imaging of the chest was performed using the standard protocol during bolus administration of intravenous contrast. Multiplanar CT image reconstructions and MIPs were obtained to evaluate the vascular anatomy. CONTRAST:  48mL OMNIPAQUE IOHEXOL 350 MG/ML SOLN COMPARISON:  February 02, 2012 FINDINGS: Cardiovascular: The segmental and subsegmental pulmonary arteries are limited in evaluation secondary to patient motion and suboptimal opacification with intravenous contrast. This is most prominent within the bilateral lower lobes. A thin linear focus  of intraluminal low attenuation is seen within the distal aspect of the right pulmonary artery (axial CT images 139 through 143, CT series number 5). There is mild cardiomegaly with marked severity coronary artery calcification. No pericardial effusion. Mediastinum/Nodes: No enlarged mediastinal, hilar, or axillary lymph nodes. Thyroid gland, trachea, and esophagus demonstrate no significant findings. Lungs/Pleura: A mild amount of consolidation is seen along the posterior aspect of the left lower lobe. A small  amount of atelectasis and/or infiltrate is also seen along the lateral aspect of the left lower lobe. A very small left pleural effusion is noted. No pneumothorax is identified. Upper Abdomen: The visualized portion of the upper pole of the left kidney is atrophic in appearance. Musculoskeletal: Multilevel degenerative changes seen throughout the thoracic spine Review of the MIP images confirms the above findings. IMPRESSION: 1. Limited evaluation of the pulmonary arteries, as described above. Given the degree of visible artifact, acute pulmonary embolism cannot be excluded. Correlation with follow-up chest CTA is recommended. 2. 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 3. Mild posterior left lower lobe consolidation with additional small areas of left lower lobe atelectasis and/or infiltrate. 4. Very small left pleural effusion. Electronically Signed   By: Aram Candelahaddeus  Houston M.D.   On: 03/20/2020 19:27   CT Cervical Spine Wo Contrast  Result Date: 03/16/2020 CLINICAL DATA:  Fall EXAM: CT HEAD WITHOUT CONTRAST TECHNIQUE: Contiguous axial images were obtained from the base of the skull through the vertex without intravenous contrast. COMPARISON:  None. FINDINGS: Brain: No evidence of acute territorial infarction, hemorrhage, hydrocephalus,extra-axial collection or mass lesion/mass effect. There is dilatation the ventricles and sulci  consistent with age-related atrophy. Low-attenuation changes in the deep white matter consistent with small vessel ischemia. Vascular: No hyperdense vessel or unexpected calcification. Skull: The skull is intact. No fracture or focal lesion identified. Sinuses/Orbits: The visualized paranasal sinuses and mastoid air cells are clear. The orbits and globes intact. Other: None Cervical spine: Alignment: There is straightening of the normal cervical lordosis. Skull base and vertebrae: Visualized skull base is intact. No atlanto-occipital dissociation. The vertebral body heights are well maintained. No fracture or pathologic osseous lesion seen. Soft tissues and spinal canal: The visualized paraspinal soft tissues are unremarkable. No prevertebral soft tissue swelling is seen. The spinal canal is grossly unremarkable, no large epidural collection or significant canal narrowing. Disc levels: Multilevel cervical spine spondylosis is seen with disc osteophyte complex and uncovertebral osteophytes with anterior osteophytes present. This is most notable at C5-C6 with severe left neural foraminal narrowing and mild to moderate central canal stenosis. Upper chest: The lung apices are clear. Thoracic inlet is within normal limits. Other: None IMPRESSION: No acute intracranial abnormality. Findings consistent with age related atrophy and chronic small vessel ischemia No acute fracture or malalignment of the spine. Electronically Signed   By: Jonna ClarkBindu  Avutu M.D.   On: 03/16/2020 01:49   CT Lumbar Spine Wo Contrast  Result Date: 03/16/2020 CLINICAL DATA:  Fall with low back pain EXAM: CT LUMBAR SPINE WITHOUT CONTRAST TECHNIQUE: Multidetector CT imaging of the lumbar spine was performed without intravenous contrast administration. Multiplanar CT image reconstructions were also generated. COMPARISON:  Lumbar spine radiograph 03/16/2020 FINDINGS: Segmentation: 5 lumbar type vertebrae. Alignment: Normal. Vertebrae: Wedge compression  fracture of L1 with approximately 10% height loss. No retropulsion. No extension to the posterior elements. No other Paraspinal and other soft tissues: Calcific aortic atherosclerosis. Disc levels: L2-3: Moderate facet hypertrophy with moderate right foraminal stenosis. L3-4: Mild facet hypertrophy with mild bilateral foraminal stenosis. L4-5: Moderate facet hypertrophy with mild right and moderate left foraminal stenosis. L5-S1: Severe facet hypertrophy. No spinal canal or neural foraminal stenosis. IMPRESSION: 1. Wedge compression fracture of L1 with approximately 10% height loss, but no retropulsion or extension to the posterior elements. 2. Moderate right L2-3 and left L4-5 foraminal stenosis. Aortic Atherosclerosis (ICD10-I70.0). Electronically Signed   By: Deatra RobinsonKevin  Herman  M.D.   On: 03/16/2020 03:36   MR BRAIN WO CONTRAST  Result Date: 03/21/2020 CLINICAL DATA:  Initial evaluation for acute neuro deficit, stroke suspected, mental status change. EXAM: MRI HEAD WITHOUT CONTRAST TECHNIQUE: Multiplanar, multiecho pulse sequences of the brain and surrounding structures were obtained without intravenous contrast. COMPARISON:  Prior head CT from earlier the same day. FINDINGS: Brain: Examination severely degraded by motion artifact, limiting assessment. Generalized age-related cerebral atrophy. Extensive patchy and confluent T2/FLAIR hyperintensity within the periventricular deep white matter both cerebral hemispheres most consistent with chronic small vessel ischemic disease, advanced in nature. Small remote lacunar infarcts noted at the right basal ganglia and possibly left basal ganglia and thalamus as well. Few additional remote lacunar infarcts at the right pons. Small focus of encephalomalacia at the paramedian left occipital lobe consistent with a chronic left PCA territory infarct. Additional chronic left ACA territory infarct present at the high parasagittal left frontal lobe. Moderate to large area of  confluent restricted diffusion seen involving the left temporal lobe, consistent with an acute left MCA distribution infarct. Patchy involvement of the insula. Posterior extension to involve the left parieto-occipital region. Suspected associated petechial hemorrhage, difficult to see on this exam, although is likely evident on prior CT. No frank hemorrhagic transformation or significant regional mass effect. No other foci of restricted diffusion to suggest acute or subacute ischemia. Gray-white matter differentiation otherwise maintained. No visible acute intracranial hemorrhage on this motion degraded exam. No mass lesion, midline shift, or significant mass effect. Diffuse ventricular prominence related to global parenchymal volume loss without hydrocephalus. No extra-axial fluid collection. Pituitary gland and suprasellar region normal. Midline structures intact. Vascular: Major intracranial vascular flow voids are grossly maintained at the skull base. Skull and upper cervical spine: Craniocervical junction within normal limits. Bone marrow signal intensity normal. No scalp soft tissue abnormality. Sinuses/Orbits: Globes and orbital soft tissues grossly within normal limits. Mild mucosal thickening noted within the right maxillary sinus. Paranasal sinuses are otherwise clear. Bilateral mastoid effusions noted. Visualized nasopharynx grossly unremarkable. Other: None. IMPRESSION: 1. Technically limited exam due to extensive motion artifact. 2. Moderate to large sized acute left MCA distribution infarct, primarily involving the left temporal lobe as above. Suspected associated petechial hemorrhage without frank hemorrhagic transformation or significant regional mass effect. 3. Underlying age-related cerebral atrophy with advanced chronic microvascular ischemic disease, with superimposed chronic left PCA and ACA territory infarcts. Electronically Signed   By: Rise Mu M.D.   On: 03/21/2020 23:16   DG  Hip Unilat W or Wo Pelvis 2-3 Views Left  Result Date: 03/16/2020 CLINICAL DATA:  Fall EXAM: DG HIP (WITH OR WITHOUT PELVIS) 2-3V LEFT COMPARISON:  None. FINDINGS: There is no evidence of hip fracture or dislocation. There is no evidence of arthropathy or other focal bone abnormality. IMPRESSION: Negative. Electronically Signed   By: Deatra Robinson M.D.   On: 03/16/2020 02:08   ECHOCARDIOGRAM LIMITED  Result Date: 03/21/2020    ECHOCARDIOGRAM LIMITED REPORT   Patient Name:   Brian Mclaughlin Date of Exam: 03/21/2020 Medical Rec #:  [id number]   Height:       72.0 in Accession #:    [id number]  Weight:       205.0 lb Date of Birth:  1946/03/09  BSA:          2.153 m Patient Age:    74 years    BP:           150/98 mmHg Patient Gender: M  HR:           67 bpm. Exam Location:  Inpatient Procedure: Limited Echo, Cardiac Doppler, Color Doppler and Intracardiac            Opacification Agent Indications:    CHF, pulmonary Embolus  History:        Patient has prior history of Echocardiogram examinations. CHF,                 Stroke, Arrythmias:Atrial Fibrillation; Risk                 Factors:Hypertension and Dyslipidemia. COVID +, CKD, alcohol                 abuse.  Sonographer:    Lavenia Atlas Referring Phys: [phone] Tyrone Nine IMPRESSIONS  1. Left ventricular ejection fraction, by estimation, is 45 to 50%. The left ventricle has mildly decreased function. The left ventricle demonstrates global hypokinesis. There is mild concentric left ventricular hypertrophy. Left ventricular diastolic function could not be evaluated.  2. Right ventricular systolic function is normal. The right ventricular size is normal. There is normal pulmonary artery systolic pressure.  3. The mitral valve is normal in structure. Mild mitral valve regurgitation. No evidence of mitral stenosis.  4. The aortic valve is normal in structure. Aortic valve regurgitation is mild. No aortic stenosis is present.  5. There is mild dilatation of  the aortic root and at the level of the sinuses of Valsalva, measuring 45 mm.  6. The inferior vena cava is normal in size with greater than 50% respiratory variability, suggesting right atrial pressure of 3 mmHg. FINDINGS  Left Ventricle: Left ventricular ejection fraction, by estimation, is 45 to 50%. The left ventricle has mildly decreased function. The left ventricle demonstrates global hypokinesis. Definity contrast agent was given IV to delineate the left ventricular  endocardial borders. The left ventricular internal cavity size was normal in size. There is mild concentric left ventricular hypertrophy. Left ventricular diastolic function could not be evaluated. Left ventricular diastolic function could not be evaluated due to atrial fibrillation. Right Ventricle: The right ventricular size is normal. No increase in right ventricular wall thickness. Right ventricular systolic function is normal. There is normal pulmonary artery systolic pressure. The tricuspid regurgitant velocity is 2.21 m/s, and  with an assumed right atrial pressure of 3 mmHg, the estimated right ventricular systolic pressure is 22.5 mmHg. Left Atrium: Left atrial size was normal in size. Right Atrium: Right atrial size was normal in size. Pericardium: There is no evidence of pericardial effusion. Mitral Valve: The mitral valve is normal in structure. Mild mitral valve regurgitation. No evidence of mitral valve stenosis. Tricuspid Valve: The tricuspid valve is normal in structure. Tricuspid valve regurgitation is mild . No evidence of tricuspid stenosis. Aortic Valve: The aortic valve is normal in structure. Aortic valve regurgitation is mild. Aortic regurgitation PHT measures 852 msec. No aortic stenosis is present. Pulmonic Valve: The pulmonic valve was normal in structure. Pulmonic valve regurgitation is not visualized. No evidence of pulmonic stenosis. Aorta: The aortic root is normal in size and structure. There is mild dilatation of  the aortic root and at the level of the sinuses of Valsalva, measuring 45 mm. Venous: The inferior vena cava is normal in size with greater than 50% respiratory variability, suggesting right atrial pressure of 3 mmHg. IAS/Shunts: No atrial level shunt detected by color flow Doppler. LEFT VENTRICLE PLAX 2D LVIDd:  5.50 cm LVIDs:         4.00 cm LV PW:         1.30 cm LV IVS:        1.30 cm LVOT diam:     2.60 cm LV SV:         57 LV SV Index:   26 LVOT Area:     5.31 cm  LEFT ATRIUM           Index LA diam:      3.90 cm 1.81 cm/m LA Vol (A4C): 41.3 ml 19.18 ml/m  AORTIC VALVE LVOT Vmax:   59.40 cm/s LVOT Vmean:  37.800 cm/s LVOT VTI:    0.107 m AI PHT:      852 msec  AORTA Ao Root diam: 4.50 cm MITRAL VALVE               TRICUSPID VALVE MV Area (PHT): 2.87 cm    TR Peak grad:   19.5 mmHg MV Decel Time: 264 msec    TR Vmax:        221.00 cm/s MV E velocity: 49.70 cm/s MV A velocity: 27.00 cm/s  SHUNTS MV E/A ratio:  1.84        Systemic VTI:  0.11 m                            Systemic Diam: 2.60 cm Armanda Magic MD Electronically signed by Armanda Magic MD Signature Date/Time: 03/21/2020/4:51:44 PM    Final      Subjective: Patient incoherent and minimally verbal. Has taken some po intake today per RN.   Discharge Exam: Vitals:   03/23/20 1953 03/24/20 0904  BP: (!) 145/93   Pulse: (!) 53 71  Resp: 14   Temp: 97.6 F (36.4 C)   SpO2: 99% 100%   General: Pt is alert in no distress Neuro: Right hemiparesis and extinction, leftward gaze, aphasia seems to be both expressive > receptive but difficult to tell. Overall, exam stable from prior.  Labs: BNP (last 3 results) Recent Labs    03/20/20 1432  BNP 268.0*   Basic Metabolic Panel: Recent Labs  Lab 03/20/20 1304 03/20/20 1432 03/21/20 0437 03/22/20 0528 03/23/20 0511  NA 134*  --  134* 135 138  K 4.6  --  4.6 4.3 4.5  CL 102  --  103 104 108  CO2 23  --  20* 20* 21*  GLUCOSE 121*  --  135* 121* 118*  BUN 32*  --  33* 53* 64*   CREATININE 1.33*  --  1.50* 2.22* 2.14*  CALCIUM 8.7*  --  8.3* 7.8* 7.8*  MG  --  1.8 2.2 2.4 2.4  PHOS  --  2.7  --   --   --    Liver Function Tests: Recent Labs  Lab 03/20/20 1304 03/21/20 0437 03/22/20 0528 03/23/20 0511  AST 46* 48* 73* 65*  ALT 18 18 25 24   ALKPHOS 60 57 50 48  BILITOT 1.6* 0.9 0.5 0.6  PROT 6.9 6.5 6.1* 5.6*  ALBUMIN 3.2* 2.8* 2.9* 2.6*   No results for input(s): LIPASE, AMYLASE in the last 168 hours. Recent Labs  Lab 03/20/20 1432  AMMONIA 26   CBC: Recent Labs  Lab 03/20/20 1304 03/21/20 0437 03/22/20 0528 03/23/20 0511  WBC 5.7 5.5 10.7* 12.0*  NEUTROABS 4.2 4.5 8.0* 9.8*  HGB 14.4 13.9 11.2* 9.1*  HCT 43.2 41.8 33.8*  27.7*  MCV 98.6 98.1 99.7 100.0  PLT 120* 120* 130* 112*   Cardiac Enzymes: No results for input(s): CKTOTAL, CKMB, CKMBINDEX, TROPONINI in the last 168 hours. BNP: Invalid input(s): POCBNP CBG: No results for input(s): GLUCAP in the last 168 hours. D-Dimer Recent Labs    03/22/20 0528 03/23/20 0511  DDIMER 12.53* 10.74*   Hgb A1c Recent Labs    03/23/20 0511  HGBA1C 5.3   Lipid Profile Recent Labs    03/23/20 0511  CHOL 76  HDL 29*  LDLCALC 29  TRIG 88  CHOLHDL 2.6   Thyroid function studies No results for input(s): TSH, T4TOTAL, T3FREE, THYROIDAB in the last 72 hours.  Invalid input(s): FREET3 Anemia work up Recent Labs    03/22/20 0528 03/23/20 0511  FERRITIN 1,697* 844*   Urinalysis    Component Value Date/Time   COLORURINE YELLOW 03/16/2020 0300   APPEARANCEUR HAZY (A) 03/16/2020 0300   LABSPEC 1.030 03/16/2020 0300   PHURINE 5.0 03/16/2020 0300   GLUCOSEU NEGATIVE 03/16/2020 0300   HGBUR NEGATIVE 03/16/2020 0300   BILIRUBINUR NEGATIVE 03/16/2020 0300   KETONESUR NEGATIVE 03/16/2020 0300   PROTEINUR 100 (A) 03/16/2020 0300   UROBILINOGEN 0.2 05/24/2014 0548   NITRITE NEGATIVE 03/16/2020 0300   LEUKOCYTESUR TRACE (A) 03/16/2020 0300    Microbiology Recent Results (from the  past 240 hour(s))  SARS CORONAVIRUS 2 (TAT 6-24 HRS) Nasopharyngeal Nasopharyngeal Swab     Status: None   Collection Time: 03/16/20  4:32 AM   Specimen: Nasopharyngeal Swab  Result Value Ref Range Status   SARS Coronavirus 2 NEGATIVE NEGATIVE Final    Comment: (NOTE) SARS-CoV-2 target nucleic acids are NOT DETECTED.  The SARS-CoV-2 RNA is generally detectable in upper and lower respiratory specimens during the acute phase of infection. Negative results do not preclude SARS-CoV-2 infection, do not rule out co-infections with other pathogens, and should not be used as the sole basis for treatment or other patient management decisions. Negative results must be combined with clinical observations, patient history, and epidemiological information. The expected result is Negative.  Fact Sheet for Patients: HairSlick.nohttps://www.fda.gov/media/138098/download  Fact Sheet for Healthcare Providers: quierodirigir.comhttps://www.fda.gov/media/138095/download  This test is not yet approved or cleared by the [place] FDA and  has been authorized for detection and/or diagnosis of SARS-CoV-2 by FDA under an Emergency Use Authorization (EUA). This EUA will remain  in effect (meaning this test can be used) for the duration of the COVID-19 declaration under Se ction 564(b)(1) of the Act, 21 U.S.C. section 360bbb-3(b)(1), unless the authorization is terminated or revoked sooner.  Performed at [facility], [address] [place], [place] [address]     Time coordinating discharge: Approximately 40 minutes  Tyrone Nineyan B Atwell Mcdanel, MD  [facility] 03/24/2020, 1:28 PM

## 2020-03-24 NOTE — TOC Progression Note (Signed)
Transition of Care [facility]) - Progression Note    Patient Details  Name: Brian Mclaughlin MRN: [id number] Date of Birth: [birth year]  [facility]) CM/SW Contact  Darleene Cleaver, [place] Phone Number: 03/24/2020, 4:40 PM  Clinical Narrative:     CSW received phone call from [facility] Clydie Braun.  She stated that if patient's family is agreeable, [facility] a bed available if Wellsite geologist approves patient.  CSW contacted patient's daughter Khriz Liddy, [phone], she said that would be fine to go to [facility].  CSW updated [facility] [place] rep, she reviewed patient and at this time, she does not think patient meets criteria for inpatient hospice placement.  She stated she will see how patient is progressing tomorrow and follow up with weekend Child psychotherapist.  CSW to continue to follow patient's progress throughout discharge planning.   Expected Discharge Plan: Skilled Nursing Facility Barriers to Discharge: No Barriers Identified  Expected Discharge Plan and Services Expected Discharge Plan: Skilled Nursing Facility       Living arrangements for the past 2 months: Single Family Home Expected Discharge Date: 03/24/20                                     Social Determinants of Health (SDOH) Interventions    Readmission Risk Interventions Readmission Risk Prevention Plan 07/29/2019  Transportation Screening Complete  PCP or Specialist Appt within 5-7 Days Patient refused  Home Care Screening Patient refused  Medication Review (RN CM) Referral to Pharmacy  Some recent data might be hidden

## 2020-03-25 LAB — GLUCOSE, CAPILLARY: Glucose-Capillary: 106 mg/dL — ABNORMAL HIGH (ref 70–99)

## 2020-03-25 NOTE — TOC Progression Note (Signed)
Transition of Care [facility]) - Progression Note    Patient Details  Name: TAMAJ JURGENS MRN: [id number] Date of Birth: [birth year]  Transition [facility] Care [facility]) CM/SW Contact  [address], [place], [place] Phone Number: 03/25/2020, 11:25 AM  Clinical Narrative:    Phone call to [facility] to obtain an update on referral for residential hospice. Left message requesting return call from hospital liaison.  [address] LCSW Transition of Care [phone]     Expected Discharge Plan: Skilled Nursing Facility Barriers to Discharge: No Barriers Identified  Expected Discharge Plan and Services Expected Discharge Plan: Skilled Nursing Facility       Living arrangements for the past 2 months: Single Family Home Expected Discharge Date: 03/24/20                                     Social Determinants of Health (SDOH) Interventions    Readmission Risk Interventions Readmission Risk Prevention Plan 07/29/2019  Transportation Screening Complete  PCP or Specialist Appt within 5-7 Days Patient refused  Home Care Screening Patient refused  Medication Review (RN CM) Referral to Pharmacy  Some recent data might be hidden

## 2020-03-25 NOTE — Progress Notes (Signed)
PROGRESS NOTE  74yo M failing to thrive at home presented after a fall. 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 After goals of care discussions and confirmation of very high risk of aspiration, the plan is to go to residential hospice. He does seem to be taking some nutrition orally, so we will attempt to advance diet and if no overt aspiration, request SLP reevaluation 1/10.   Tyrone Nine, MD Pager on amion 03/25/2020, 1:08 PM

## 2020-03-25 NOTE — Plan of Care (Signed)
  Problem: Clinical Measurements: Goal: Will remain free from infection Outcome: Progressing   Problem: Pain Managment: Goal: General experience of comfort will improve Outcome: Progressing   Problem: Safety: Goal: Ability to remain free from injury will improve Outcome: Progressing   Problem: Skin Integrity: Goal: Risk for impaired skin integrity will decrease Outcome: Progressing   

## 2020-03-25 NOTE — Plan of Care (Signed)
  Problem: Skin Integrity: Goal: Risk for impaired skin integrity will decrease Outcome: Not Progressing   Problem: Coping: Goal: Psychosocial and spiritual needs will be supported Outcome: Not Progressing   Problem: Education: Goal: Knowledge of General Education information will improve Description: Including pain rating scale, medication(s)/side effects and non-pharmacologic comfort measures Outcome: Not Met (add Reason)

## 2020-03-25 NOTE — TOC Progression Note (Signed)
Transition of Care [facility]) - Progression Note    Patient Details  Name: Brian Mclaughlin MRN: [id number] Date of Birth: 02/23/1946  [facility]) CM/SW Contact  [address], [place], [place] Phone Number: 03/25/2020, 12:57 PM  Clinical Narrative:     Phone call from [place], [facility]. Patient reviewed and found that he does not meet criteria for inpatient hospice placement today. Patient can be re-assessed tomorrow.    CSW to continue to follow for discharge planning needs  [facility], [place] Transition of Care [phone]    Expected Discharge Plan: Skilled Nursing Facility Barriers to Discharge: No Barriers Identified  Expected Discharge Plan and Services Expected Discharge Plan: Skilled Nursing Facility       Living arrangements for the past 2 months: Single Family Home Expected Discharge Date: 03/24/20                                     Social Determinants of Health (SDOH) Interventions    Readmission Risk Interventions Readmission Risk Prevention Plan 07/29/2019  Transportation Screening Complete  PCP or Specialist Appt within 5-7 Days Patient refused  Home Care Screening Patient refused  Medication Review (RN CM) Referral to Pharmacy  Some recent data might be hidden

## 2020-03-26 DIAGNOSIS — I5022 Chronic systolic (congestive) heart failure: Secondary | ICD-10-CM | POA: Diagnosis not present

## 2020-03-26 DIAGNOSIS — I482 Chronic atrial fibrillation, unspecified: Secondary | ICD-10-CM | POA: Diagnosis not present

## 2020-03-26 DIAGNOSIS — I63512 Cerebral infarction due to unspecified occlusion or stenosis of left middle cerebral artery: Secondary | ICD-10-CM | POA: Diagnosis not present

## 2020-03-26 DIAGNOSIS — S32000A Wedge compression fracture of unspecified lumbar vertebra, initial encounter for closed fracture: Secondary | ICD-10-CM

## 2020-03-26 DIAGNOSIS — F1010 Alcohol abuse, uncomplicated: Secondary | ICD-10-CM | POA: Diagnosis not present

## 2020-03-26 NOTE — Care Management Important Message (Signed)
Important Message  Patient Details IM Letter given to the Patient. Name: Brian Mclaughlin MRN: [id number] Date of Birth: [birth year]   Medicare Important Message Given:  Yes     [name] 03/26/2020, 12:26 PM

## 2020-03-26 NOTE — TOC Progression Note (Signed)
Transition of Care [facility]) - Progression Note    Patient Details  Name: NOVAK STGERMAINE MRN: [id number] Date of Birth: 1945-12-04  [facility] CM/SW Contact  Geni Bers, RN Phone Number: 03/26/2020, 10:46 AM  Clinical Narrative:    [facility] reviewing pt's chart again.    Expected Discharge Plan: Skilled Nursing Facility Barriers to Discharge: No Barriers Identified  Expected Discharge Plan and Services Expected Discharge Plan: Skilled Nursing Facility       Living arrangements for the past 2 months: Single Family Home Expected Discharge Date: 03/26/20                                     Social Determinants of Health (SDOH) Interventions    Readmission Risk Interventions Readmission Risk Prevention Plan 07/29/2019  Transportation Screening Complete  PCP or Specialist Appt within 5-7 Days Patient refused  Home Care Screening Patient refused  Medication Review (RN CM) Referral to Pharmacy  Some recent data might be hidden

## 2020-03-26 NOTE — Evaluation (Signed)
Clinical/Bedside Swallow Evaluation Patient Details  Name: YOON BARCA MRN: [id number] Date of Birth: 1945/05/06  Today's Date: 03/26/2020 Time: SLP Start Time (ACUTE ONLY): 1335 SLP Stop Time (ACUTE ONLY): 1400 SLP Time Calculation (min) (ACUTE ONLY): 25 min  Past Medical History:  Past Medical History:  Diagnosis Date  . Abnormality of gait 06/14/2014  . Acute CVA (cerebrovascular accident) (HCC) 05/24/2014  . Acute kidney failure, unspecified (HCC)   . Acute, but ill-defined, cerebrovascular disease   . Alcohol abuse   . Altered mental status   . Atrial fibrillation (HCC)   . Chronic systolic CHF (congestive heart failure) (HCC)   . CVA (cerebral infarction)   . ETOH abuse   . Hyperlipidemia   . Hypertension   . Nonspecific elevation of levels of transaminase or lactic acid dehydrogenase (LDH)   . Rhabdomyolysis   . Tobacco abuse   . Tobacco use disorder    Past Surgical History:  Past Surgical History:  Procedure Laterality Date  . BACK SURGERY  10 years ago   "slipped disc" repair  . EYE MUSCLE SURGERY     as a teenager "tighten his muscles"   HPI:  AFib, chronic HFrEF, stage IIIb CKD, CVA w/dysarthria, alcohol abuse, and HTN who presented to the ED initially 03/16/2020 with dizziness and fall onto left hip at home. Comprehensive imaging at that time revealed only a wedge compression fracture at L1. He was kept in the ED awaiting SNF bed until he was noted to be coughing consistently, worse with per oral intake on 03/20/2020. Aspiration was suspected and SARS-CoV-2 PCR was positive (had been negative 12/31).   Pt found to have large Left MCA CVA, Imaging of chest showed ? chronic right pulmonary artery PE, mild LLL consolidation with assoc ATX/infiltration and small effusion.  BSE completed and patient was going comfort care so SLP s/o. POC now changed as patient does not qualify for residential hospice. In addition, patient reportedly eating and drinking better, so new BSE was  ordered.   Assessment / Plan / Recommendation Clinical Impression  Patient presented with a mild-mod oropharyngeal dysphagia with minimal instances of coughing which sounded congested. Patient did have difficulty in oral preparatory phase with cup sips and straw sips of thin liquids but after a brief delay, he was able to manage both types of intake. He exhbiited prolonged mastication with regular solids and delayed oral transit with puree and regular solid textures, but with full clearance of oral cavity. Patient did exhibit delayed coughing and throat clearing after PO's but this was intermittent. As patient has h/o dysphagia and modified textures with solids and liquids, recommend continue on current prescribed diet, but to have MBS next date to r/o aspiration and aid in GOC development. SLP Visit Diagnosis: Dysphagia, unspecified (R13.10)    Aspiration Risk  Mild aspiration risk;Moderate aspiration risk    Diet Recommendation Dysphagia 3 (Mech soft);Thin liquid   Liquid Administration via: Cup;Straw Medication Administration: Crushed with puree Supervision: Full supervision/cueing for compensatory strategies;Staff to assist with self feeding Compensations: Slow rate;Small sips/bites;Minimize environmental distractions Postural Changes: Seated upright at 90 degrees    Other  Recommendations Oral Care Recommendations: Oral care BID;Staff/trained caregiver to provide oral care   Follow up Recommendations        Frequency and Duration min 2x/week  1 week       Prognosis Prognosis for Safe Diet Advancement: Fair Barriers to Reach Goals: Severity of deficits;Time post onset Barriers/Prognosis Comment: Patient with h/o dysphagia but with  most recent MBS in 2016      Swallow Study   General Date of Onset: 03/20/20 HPI: AFib, chronic HFrEF, stage IIIb CKD, CVA w/dysarthria, alcohol abuse, and HTN who presented to the ED initially 03/16/2020 with dizziness and fall onto left hip at  home. Comprehensive imaging at that time revealed only a wedge compression fracture at L1. He was kept in the ED awaiting SNF bed until he was noted to be coughing consistently, worse with per oral intake on 03/20/2020. Aspiration was suspected and SARS-CoV-2 PCR was positive (had been negative 12/31).   Pt found to have large Left MCA CVA, Imaging of chest showed ? chronic right pulmonary artery PE, mild LLL consolidation with assoc ATX/infiltration and small effusion.  BSE completed and patient was going comfort care so SLP s/o. POC now changed as patient does not qualify for residential hospice. In addition, patient reportedly eating and drinking better, so new BSE was ordered. Type of Study: Bedside Swallow Evaluation Previous Swallow Assessment: BSE on 1/7 Diet Prior to this Study: Dysphagia 3 (soft);Thin liquids Temperature Spikes Noted: No Respiratory Status: Room air History of Recent Intubation: No Behavior/Cognition: Alert;Requires cueing;Pleasant mood;Cooperative Oral Cavity Assessment: Within Functional Limits Oral Care Completed by SLP: Yes Oral Cavity - Dentition: Adequate natural dentition Vision: Functional for self-feeding Self-Feeding Abilities: Needs assist;Needs set up Patient Positioning: Upright in bed Baseline Vocal Quality: Low vocal intensity Volitional Cough: Cognitively unable to elicit Volitional Swallow: Unable to elicit    Oral/Motor/Sensory Function Overall Oral Motor/Sensory Function: Within functional limits   Ice Chips     Thin Liquid Thin Liquid: Impaired Presentation: Cup;Straw Oral Phase Impairments: Reduced labial seal Pharyngeal  Phase Impairments: Suspected delayed Swallow;Throat Clearing - Delayed;Cough - Delayed    Nectar Thick     Honey Thick     Puree Puree: Impaired Presentation: Spoon Oral Phase Impairments: Reduced labial seal Oral Phase Functional Implications: Prolonged oral transit   Solid     Solid: Impaired Oral Phase Impairments:  Impaired mastication Oral Phase Functional Implications: Prolonged oral transit      Angela Nevin, MA, CCC-SLP 03/26/20 4:31 PM

## 2020-03-26 NOTE — Progress Notes (Signed)
   Re-evaluated the for Hospice facility. Unfortunately the pt is doing some better at this time and does not meet our criteria for inpt care setting. He is felt to probably have a 6 month or less prognosis and if went home or to a SNF would be eligible for hospice services if not going to use skilled days.   We will continue to follow him in event that he takes a drastic decline for the worst until d/c. If something changes we can always re-evaluate at that time.   Norm Parcel RN [phone]

## 2020-03-26 NOTE — Plan of Care (Signed)
  Problem: Clinical Measurements: Goal: Will remain free from infection Outcome: Progressing   Problem: Nutrition: Goal: Adequate nutrition will be maintained Outcome: Progressing   Problem: Respiratory: Goal: Will maintain a patent airway Outcome: Progressing Goal: Complications related to the disease process, condition or treatment will be avoided or minimized Outcome: Progressing   Problem: Clinical Measurements: Goal: Will remain free from infection Outcome: Progressing   Problem: Nutrition: Goal: Adequate nutrition will be maintained Outcome: Progressing   Problem: Respiratory: Goal: Will maintain a patent airway Outcome: Progressing Goal: Complications related to the disease process, condition or treatment will be avoided or minimized Outcome: Progressing

## 2020-03-26 NOTE — TOC Progression Note (Addendum)
Transition of Care [facility]) - Progression Note    Patient Details  Name: KOJI NIEHOFF MRN: [id number] Date of Birth: 23-Nov-1945  Transition of Care [facility]) CM/SW Contact  Geni Bers, RN Phone Number: 03/26/2020, 1:28 PM  Clinical Narrative:     TOC will continue to follow after ST re-evaluate. Pt did not qualify for residential hospice.    Expected Discharge Plan: Skilled Nursing Facility Barriers to Discharge: No Barriers Identified  Expected Discharge Plan and Services Expected Discharge Plan: Skilled Nursing Facility       Living arrangements for the past 2 months: Single Family Home Expected Discharge Date: 03/26/20                                     Social Determinants of Health (SDOH) Interventions    Readmission Risk Interventions Readmission Risk Prevention Plan 07/29/2019  Transportation Screening Complete  PCP or Specialist Appt within 5-7 Days Patient refused  Home Care Screening Patient refused  Medication Review (RN CM) Referral to Pharmacy  Some recent data might be hidden

## 2020-03-26 NOTE — Progress Notes (Signed)
PROGRESS NOTE  74yo M failing to thrive at home presented after a fall. 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 After goals of care discussions and confirmation of very high risk of aspiration, the plan is to go to residential hospice.   - Remains stable for discharge. [facility] reviewing again today - Repeat SLP evaluation today.   Tyrone Nine, MD Pager on amion 03/26/2020, 11:48 AM

## 2020-03-26 NOTE — Progress Notes (Signed)
   Daily Progress Note   Patient Name: Brian Mclaughlin       Date: 03/26/2020 DOB: [birth year]  Age: 75 y.o. MRN#: [id number] Attending Physician: Tyrone Nine, MD Primary Care Physician: Patient, No Pcp Per Admit Date: 03/16/2020  Reason for Consultation/Follow-up: Non pain symptom management, Pain control and Psychosocial/spiritual support  Patient remains frail with poor prognosis. Able to take in some oral intake in the setting of comfort care and family's awareness of continued aspiration risk.   He is pending hospice facility approval for continued care during what time he has left.   Family updated and aware patient will not recover again emphasizing drastic decline in quality of life, health, and debilitating state.   Length of Stay: 6 days  Vital Signs: BP (!) 138/91 (BP Location: Left Arm)   Pulse 77   Temp 98.7 F (37.1 C) (Oral)   Resp 18   Ht 6' (1.829 m)   Wt 97.8 kg   SpO2 96%   BMI 29.24 kg/m  SpO2: SpO2: 96 % O2 Device: O2 Device: Room Air O2 Flow Rate: O2 Flow Rate (L/min): 4 L/min         Palliative Care Assessment & Plan    Goals of Care/Recommendations:  Continue comfort measures  Pending hospice facility approval  PMT will continue to support and follow  Prognosis: Poor (weeks) in the setting of COVID-positive, severe MCA stroke, severe dysphagia, expressive aphasia, high risk of aspiration, CHF, CKD, hypertension, compression fracture, failure to thrive, debilitated, poor nutrition.   Discharge Planning: To Be Determined  Thank you for allowing the Palliative Medicine Team to assist in the care of this patient.  Time Total: 25 min.   Visit consisted of counseling and education dealing with the complex and emotionally intense issues of symptom management and palliative care in the setting of serious and potentially life-threatening illness.Greater than 50%  of this time was spent counseling and coordinating care related to the above assessment and  plan.  Willette Alma, AGPCNP-BC  Palliative Medicine Team [phone]

## 2020-03-26 NOTE — TOC Progression Note (Signed)
Transition of Care [facility]) - Progression Note    Patient Details  Name: Brian Mclaughlin MRN: [id number] Date of Birth: 1945-06-12  Transition of Care [facility]) CM/SW Contact  Geni Bers, RN Phone Number: 03/26/2020, 2:55 PM  Clinical Narrative:    Checking with SNF to see if pt may discharge there. SNF will need to start authorization.    Expected Discharge Plan: Skilled Nursing Facility Barriers to Discharge: No Barriers Identified  Expected Discharge Plan and Services Expected Discharge Plan: Skilled Nursing Facility       Living arrangements for the past 2 months: Single Family Home Expected Discharge Date: 03/26/20                                     Social Determinants of Health (SDOH) Interventions    Readmission Risk Interventions Readmission Risk Prevention Plan 07/29/2019  Transportation Screening Complete  PCP or Specialist Appt within 5-7 Days Patient refused  Home Care Screening Patient refused  Medication Review (RN CM) Referral to Pharmacy  Some recent data might be hidden

## 2020-03-27 ENCOUNTER — Inpatient Hospital Stay (HOSPITAL_COMMUNITY): Payer: Medicare HMO

## 2020-03-27 DIAGNOSIS — F1010 Alcohol abuse, uncomplicated: Secondary | ICD-10-CM | POA: Diagnosis not present

## 2020-03-27 DIAGNOSIS — N1832 Chronic kidney disease, stage 3b: Secondary | ICD-10-CM | POA: Diagnosis not present

## 2020-03-27 DIAGNOSIS — I63512 Cerebral infarction due to unspecified occlusion or stenosis of left middle cerebral artery: Secondary | ICD-10-CM | POA: Diagnosis not present

## 2020-03-27 DIAGNOSIS — I482 Chronic atrial fibrillation, unspecified: Secondary | ICD-10-CM | POA: Diagnosis not present

## 2020-03-27 MED ORDER — FOOD THICKENER (SIMPLYTHICK)
1.0000 | ORAL | Status: DC | PRN
  Filled 2020-03-27 (×2): qty 1

## 2020-03-27 NOTE — Progress Notes (Signed)
  Speech Language Pathology Treatment: Dysphagia  Patient Details Name: Brian Mclaughlin MRN: [id number] DOB: 1945-05-01 Today's Date: 03/27/2020 Time: 1830-1905 SLP Time Calculation (min) (ACUTE ONLY): 35 min  Assessment / Plan / Recommendation Clinical Impression  Pt continues to have delayed swallow - at times not eliciting a swallow until he is provided with more intake. For example, absent swallow for longer than 45 seconds with solids - presumed to be accumulating in pharynx- requiring SLP to provide him with applesauce to elicit.  Pt provided with thin soda, Boost Breeze, nectar juice, applesauce and roll.  Prolonged mastication noted with pt's decreased awareness to bolus and SLP removed portion.  SLP placed boluses on stronger left side of oral cavity.  Pt only consumed approximately 9 boluses during dinner requiring approximately 30 minutes to eat.  Cough x2 observed during dinner - with second event much more pronounced.  Pt did not cough during MBS however given his level of delay in swallow and minimal aspiration - certain aspiration occured during meal.    Unless pt's swallow function improves significantly, concern for adequacy of nutrition without overt aspiration continues.  Pt did not follow directions due this aphasia - and this also increases his aspiration risk.  His dysphagia makes his swallow very inefficient and he will aspirate with po intake.  Feeder must observe pt's larynx to lift to assure he swallows and oral suction during intake.  Informed RN of concerns and messaged MD *after hours* and palliative NP regarding concerns.    HPI HPI: AFib, chronic HFrEF, stage IIIb CKD, CVA w/dysarthria, alcohol abuse, and HTN who presented to the ED initially 03/16/2020 with dizziness and fall onto left hip at home. Comprehensive imaging at that time revealed only a wedge compression fracture at L1. He was kept in the ED awaiting SNF bed until he was noted to be coughing consistently, worse  with per oral intake on 03/20/2020. Aspiration was suspected and SARS-CoV-2 PCR was positive (had been negative 12/31).   Pt found to have large Left MCA CVA, Imaging of chest showed ? chronic right pulmonary artery PE, mild LLL consolidation with assoc ATX/infiltration and small effusion.  BSE completed and patient was going comfort care so SLP s/o. POC now changed as patient does not qualify for residential hospice. In addition, patient reportedly eating and drinking better, so new BSE was ordered.  Pt is s/p MBS today and SLP follow up initiated due to MBS findings to help elucidate if barium contributed to his dysphagia.      SLP Plan  Continue with current plan of care       Recommendations  Diet recommendations: Thin liquid;Nectar-thick liquid (full liquids, nectar thick and tsps of thin) Liquids provided via: Cup;Teaspoon;Straw Medication Administration: Crushed with puree Supervision: Full supervision/cueing for compensatory strategies Compensations: Slow rate;Small sips/bites;Minimize environmental distractions;Lingual sweep for clearance of pocketing;Other (Comment) (frequent oral suctioning) Postural Changes and/or Swallow Maneuvers: Seated upright 90 degrees;Out of bed for meals                Oral Care Recommendations: Oral care before and after PO Follow up Recommendations: Skilled Nursing facility SLP Visit Diagnosis: Dysphagia, oropharyngeal phase (R13.12) Plan: Continue with current plan of care       GO                Chales Abrahams 03/27/2020, 9:29 PM  Rolena Infante, MS [facility] SLP Acute Rehab Services Office [phone] Pager [phone]

## 2020-03-27 NOTE — TOC Progression Note (Signed)
Transition of Care [facility]) - Progression Note    Patient Details  Name: Brian Mclaughlin MRN: [id number] Date of Birth: 1945/06/28  Transition of Care [facility]) CM/SW Contact  Geni Bers, RN Phone Number: 03/27/2020, 11:08 AM  Clinical Narrative:    [facility] in [place] offered pt a bed. Daughter [name] [phone] was called. [name] accepted [facility] bed offer. Plan to transfer in AM.    Expected Discharge Plan: Skilled Nursing Facility Barriers to Discharge: No Barriers Identified  Expected Discharge Plan and Services Expected Discharge Plan: Skilled Nursing Facility       Living arrangements for the past 2 months: Single Family Home Expected Discharge Date: 03/26/20                                     Social Determinants of Health (SDOH) Interventions    Readmission Risk Interventions Readmission Risk Prevention Plan 07/29/2019  Transportation Screening Complete  PCP or Specialist Appt within 5-7 Days Patient refused  Home Care Screening Patient refused  Medication Review (RN CM) Referral to Pharmacy  Some recent data might be hidden

## 2020-03-27 NOTE — Progress Notes (Signed)
   Daily Progress Note   Patient Name: Brian Mclaughlin       Date: 03/27/2020 DOB: [birth year]  Age: 75 y.o. MRN#: [id number] Attending Physician: Tyrone Nine, MD Primary Care Physician: Patient, No Pcp Per Admit Date: 03/16/2020  Reason for Consultation/Follow-up: Establishing goals of care, Non pain symptom management, Pain control and Psychosocial/spiritual support  Chart reviewed and Updates received.   Brian Mclaughlin continues to show some improvement in oral nutrition and alertness. Able to tolerate po intake with some known aspiration and delayed coughing, however per SLP evaluation this is intermittent.   Updates provided to daughter, Brian Mclaughlin. We discussed at this time he is not residential hospice home appropriate given his somewhat improved state of health. Education provided on what long-term prognosis and outlook may look like for patient.   Daughter verbalized understanding and appreciation. She reports as long as patient is not suffering that is family's main concern. She has been in communication with our Social Work team and confirms wishes for patient to transfer to SNF [facility]) once bed available and he is ready to transfer. Confirms wishes to continue to focus on symptom management as needed and DNR/DNI.   Education provided on outpatient Palliative vs. Hospice support. Given patient's poor long-term prognosis and co-morbidities encouraged continued outpatient Palliative support with understanding patient may transition at anytime to hospice if conditions worsens in the future.   Brian Mclaughlin verbalizes understanding and appreciation of all care and supported by medical team.   Length of Stay: 7 days  Vital Signs: BP (!) 159/89 (BP Location: Left Arm)   Pulse (!) 58   Temp 98.6 F (37 C) (Oral)   Resp 18   Ht 6' (1.829 m)   Wt 97.8 kg   SpO2 96%   BMI 29.24 kg/m  SpO2: SpO2: 96 % O2 Device: O2 Device: Room Air O2 Flow Rate: O2 Flow Rate (L/min): 4 L/min   Palliative Care  Assessment & Plan   Goals of Care/Recommendations:  DNR/DNI  Patient showing some improvement. No longer a candidate for hospice facility. Updates provided to daughter. She verbalized understanding and awareness of long-term prognosis.   Daughter requesting SNF transfer once available and outpatient palliative for continued support. Aware if patient's condition further declines may transition to hospice.   PMT will continue to support and follow as needed.   Prognosis: < 6 months  Discharge Planning: Skilled Nursing Facility for rehab with Palliative care service follow-up  Thank you for allowing the Palliative Medicine Team to assist in the care of this patient.  Time Total: 35 min.   Visit consisted of counseling and education dealing with the complex and emotionally intense issues of symptom management and palliative care in the setting of serious and potentially life-threatening illness.Greater than 50%  of this time was spent counseling and coordinating care related to the above assessment and plan.  Willette Alma, AGPCNP-BC  Palliative Medicine Team [phone]

## 2020-03-27 NOTE — Plan of Care (Signed)
  Problem: Education: Goal: Knowledge of General Education information will improve Description: Including pain rating scale, medication(s)/side effects and non-pharmacologic comfort measures Outcome: Not Progressing   

## 2020-03-27 NOTE — Plan of Care (Signed)
  Problem: Pain Managment: Goal: General experience of comfort will improve Outcome: Progressing   Problem: Safety: Goal: Ability to remain free from injury will improve Outcome: Progressing   Problem: Skin Integrity: Goal: Risk for impaired skin integrity will decrease Outcome: Progressing   

## 2020-03-27 NOTE — TOC Progression Note (Signed)
Transition of Care [facility]) - Progression Note    Patient Details  Name: Brian Mclaughlin MRN: [id number] Date of Birth: 1945/07/17  Transition of Care [facility]) CM/SW Contact  Geni Bers, RN Phone Number: 03/27/2020, 10:26 AM  Clinical Narrative:    Pt will need PT/OT updated notes for insurance.    Expected Discharge Plan: Skilled Nursing Facility Barriers to Discharge: No Barriers Identified  Expected Discharge Plan and Services Expected Discharge Plan: Skilled Nursing Facility       Living arrangements for the past 2 months: Single Family Home Expected Discharge Date: 03/26/20                                     Social Determinants of Health (SDOH) Interventions    Readmission Risk Interventions Readmission Risk Prevention Plan 07/29/2019  Transportation Screening Complete  PCP or Specialist Appt within 5-7 Days Patient refused  Home Care Screening Patient refused  Medication Review (RN CM) Referral to Pharmacy  Some recent data might be hidden

## 2020-03-27 NOTE — Progress Notes (Signed)
Modified Barium Swallow Progress Note  Patient Details  Name: Brian Mclaughlin MRN: [id number] Date of Birth: April 09, 1945  Today's Date: 03/27/2020  Modified Barium Swallow completed.  Full report located under Chart Review in the Imaging Section.  Brief recommendations include the following:  Clinical Impression  Moderately severe oropharyngeal dysphagia with sensorimotor deficits.  Premature spillage of all boluses into pharynx *(poorly controlled) noted, in addition to oral retention of liquids with anterior loss on right and lack of mastication of solids requiring SLP to remove bolus from his oral cavity.  Pharyngeal swallow c/b DELAY with barium pooling at pyriform sinus prior to swallow trigger -varying from 3-11 seconds (and longer with nectar via tsp.)  Suspect smaller bolus size was inadequate for sensation/pharyngeal trigger.  Pt did have mild aspiration (SILENT) of thin and nectar as barium spilled into open larynx from pyriform with swallow.  Lying chair back approx 23-30* allowed barium to pool posterior and prevented aspiration. Pt's aspiration and malnutrition risk will be chronic in this SLPs opinion, however question impact of barium on testing performance.  Will follow up for dysphagia management. Based on MBS, recommend dys1/nectar and allow pt tsps of thin. Frequent oral suctioning and assuring pt swallows is paramount.   Swallow Evaluation Recommendations       SLP Diet Recommendations: Dysphagia 1 (Puree) solids;Nectar thick liquid (thin via tsp) Extra gravy/sauces              Compensations: Slow rate;Small sips/bites;Minimize environmental distractions;Lingual sweep for clearance of pocketing;Other (Comment) (frequent oral suctioning)  HOB at approx 30    Postural Changes: Remain semi-upright after after feeds/meals (Comment);Seated upright at 90 degrees   Oral Care Recommendations: Oral care before and after PO (before, during and after)   Other Recommendations:  Order thickener from pharmacy;Have oral suction available   Rolena Infante, MS [facility] SLP Acute Rehab Services Office [phone] Pager [phone]   Chales Abrahams 03/27/2020,6:02 PM

## 2020-03-27 NOTE — Progress Notes (Signed)
PROGRESS NOTE  Brian Mclaughlin  GNO:[id number] DOB: [birth year] DOA: 03/16/2020 PCP: Patient, No Pcp Per   Brief Narrative: Brian Mclaughlin is a 75 y.o. male with a history of AFib, chronic HFrEF, stage IIIb CKD, CVA w/dysarthria, alcohol abuse, and HTN who presented to the ED initially 03/16/2020 with dizziness and fall onto left hip at home. Comprehensive imaging at that time revealed only a wedge compression fracture at L1. He was kept in the ED awaiting SNF bed until he was noted to be coughing consistently, worse with per oral intake on 03/20/2020. Aspiration was suspected and SARS-CoV-2 PCR was positive (had been negative 12/31). CXR revealed mild left base atelectasis on background of cardiomegaly. CRP was found to be 12.8, though WBC remained wnl. PCT 0.11, d-dimer 19.24. Subsequent CTA chest evaluation was limited by artifact with suspicion for chronic PE in right pulmonary artery, otherwise inconclusive. Also noted was mild posterior LLL consolidation with associated atelectasis/infiltrate and very small effusion. Heparin was started in addition to remdesivir, ceftriaxone, azithromycin, and decadron.   Unfortunately the patient developed significant aphasia and right sided hemiparesis/extinction with neuroimaging confirming fairly large left MCA infarct with proximal M2 branch stenosis or occlusion. Speech therapy evaluation is pending and will likely inform disposition venue as goals of care discussions are continued.  Assessment & Plan: Principal Problem:   COVID-19 virus infection Active Problems:   HTN (hypertension)   Alcohol abuse   Chronic systolic CHF (congestive heart failure) (HCC)   Chronic atrial fibrillation (HCC)   Chronic kidney disease, stage 3b (HCC)   Mixed hyperlipidemia   Acute encephalopathy   Fall   Failure to thrive in adult   Acute cerebrovascular accident (CVA) due to occlusion of left middle cerebral artery (HCC)   Pressure injury of skin  Chronic right pulmonary  artery PE: CTA unable to exclude other clots as well. Echocardiogram with normal RV size, function, and normal estimated PA pressure.   - Holding anticoagulation as above. Per discussion with neurology, would recommend delay in initiation of anticoagulation in 2 weeks given appearance of stroke on neuroimaging.   Fall at home, L1 compression fracture:  - SNF disposition planned  Chronic HFrEF: Not overloaded currently. BNP 268, chronically elevated dating to 2019.  Acute left MCA stroke: With significant debility, proximal left M2 stenosis vs occlusion.  - Initiate ASA, statin once clearing MBSS. - Requires rehabilitation at SNF  Covid-19 infection: Asymptomatic, did receive remdesivir, decadron 1/4 - 1/7 - Due to mild nature of symptoms, would recommend 10 days isolation from positive test (1/4).  PAF, HTN:  - Hold anticoagulation and rate control agents, remains rate-controlled.   Aspiration pneumonia:  - s/p CTX, azithromycin 1/4 - 1/6.  AST elevation: Likely related to covid.  - Monitor  Thrombocytopenia: Likely related to covid, though has been present since at least Oct 2021. Remains >100k. Stable.  Stage IIIb CKD: Avoid nephrotoxins.  DVT prophylaxis: SCDs Code Status: Full Family Communication: None today. Palliative and CM spoke with daughter. Disposition Plan:  Status is: Inpatient    Remains inpatient appropriate because:Altered mental status, Ongoing diagnostic testing needed not appropriate for outpatient work up and Inpatient level of care appropriate due to severity of illness   Dispo: The patient is from: Home              Anticipated d/c is to: SNF with palliative              Anticipated d/c date is: 1 day  Patient currently is not medically stable to d/c.  Consultants:   None  Procedures:   None  Antimicrobials:  Ceftriaxone, azithromycin, remdesivir   Subjective: No overnight events, though is starting to take much better per oral  intake with intermittent coughing.  Objective: Vitals:   03/26/20 0351 03/26/20 1310 03/26/20 2027 03/27/20 0556  BP: (!) 138/91 136/76 (!) 150/90 (!) 159/89  Pulse: 77 69 60 (!) 58  Resp: 18 14 18 18   Temp: 98.7 F (37.1 C) 98.1 F (36.7 C) 98.3 F (36.8 C) 98.6 F (37 C)  TempSrc: Oral Oral Oral Oral  SpO2: 96% 96% 98% 96%  Weight:      Height:       Gen: 75 y.o. male in no distress Pulm: Nonlabored breathing room air. GI: Abdomen soft, non-tender, non-distended  Ext: Warm, no deformities Neuro: Alert and aphasic, raises left arm when given verbal direction without demonstration.   Time spent: 35 minutes  61, MD [facility] [email or web address] 03/27/2020, 4:36 PM

## 2020-03-28 DIAGNOSIS — I5022 Chronic systolic (congestive) heart failure: Secondary | ICD-10-CM | POA: Diagnosis not present

## 2020-03-28 DIAGNOSIS — R69 Illness, unspecified: Secondary | ICD-10-CM | POA: Diagnosis not present

## 2020-03-28 DIAGNOSIS — R41841 Cognitive communication deficit: Secondary | ICD-10-CM | POA: Diagnosis not present

## 2020-03-28 DIAGNOSIS — R051 Acute cough: Secondary | ICD-10-CM | POA: Diagnosis not present

## 2020-03-28 DIAGNOSIS — M2550 Pain in unspecified joint: Secondary | ICD-10-CM | POA: Diagnosis not present

## 2020-03-28 DIAGNOSIS — I10 Essential (primary) hypertension: Secondary | ICD-10-CM | POA: Diagnosis not present

## 2020-03-28 DIAGNOSIS — E782 Mixed hyperlipidemia: Secondary | ICD-10-CM | POA: Diagnosis not present

## 2020-03-28 DIAGNOSIS — I69320 Aphasia following cerebral infarction: Secondary | ICD-10-CM | POA: Diagnosis not present

## 2020-03-28 DIAGNOSIS — Z7401 Bed confinement status: Secondary | ICD-10-CM | POA: Diagnosis not present

## 2020-03-28 DIAGNOSIS — U071 COVID-19: Secondary | ICD-10-CM | POA: Diagnosis not present

## 2020-03-28 DIAGNOSIS — G459 Transient cerebral ischemic attack, unspecified: Secondary | ICD-10-CM | POA: Diagnosis not present

## 2020-03-28 DIAGNOSIS — E785 Hyperlipidemia, unspecified: Secondary | ICD-10-CM | POA: Diagnosis not present

## 2020-03-28 DIAGNOSIS — I69351 Hemiplegia and hemiparesis following cerebral infarction affecting right dominant side: Secondary | ICD-10-CM | POA: Diagnosis not present

## 2020-03-28 DIAGNOSIS — G9340 Encephalopathy, unspecified: Secondary | ICD-10-CM | POA: Diagnosis not present

## 2020-03-28 DIAGNOSIS — Z515 Encounter for palliative care: Secondary | ICD-10-CM | POA: Diagnosis not present

## 2020-03-28 DIAGNOSIS — Z743 Need for continuous supervision: Secondary | ICD-10-CM | POA: Diagnosis not present

## 2020-03-28 DIAGNOSIS — I482 Chronic atrial fibrillation, unspecified: Secondary | ICD-10-CM | POA: Diagnosis not present

## 2020-03-28 DIAGNOSIS — R1310 Dysphagia, unspecified: Secondary | ICD-10-CM | POA: Diagnosis not present

## 2020-03-28 DIAGNOSIS — N1832 Chronic kidney disease, stage 3b: Secondary | ICD-10-CM | POA: Diagnosis not present

## 2020-03-28 DIAGNOSIS — W19XXXA Unspecified fall, initial encounter: Secondary | ICD-10-CM | POA: Diagnosis not present

## 2020-03-28 DIAGNOSIS — I480 Paroxysmal atrial fibrillation: Secondary | ICD-10-CM | POA: Diagnosis not present

## 2020-03-28 DIAGNOSIS — M6281 Muscle weakness (generalized): Secondary | ICD-10-CM | POA: Diagnosis not present

## 2020-03-28 DIAGNOSIS — S32000A Wedge compression fracture of unspecified lumbar vertebra, initial encounter for closed fracture: Secondary | ICD-10-CM | POA: Diagnosis not present

## 2020-03-28 DIAGNOSIS — I63512 Cerebral infarction due to unspecified occlusion or stenosis of left middle cerebral artery: Secondary | ICD-10-CM | POA: Diagnosis not present

## 2020-03-28 DIAGNOSIS — R627 Adult failure to thrive: Secondary | ICD-10-CM | POA: Diagnosis not present

## 2020-03-28 NOTE — TOC Transition Note (Addendum)
Transition of Care [facility]) - CM/SW Discharge Note   Patient Details  Name: Brian Mclaughlin MRN: [id number] Date of Birth: 1945/06/30  Transition of Care [facility]) CM/SW Contact:  Darleene Cleaver, LCSW Phone Number: 03/28/2020, 11:23 AM   Clinical Narrative:     Patient to be d/c'ed today to [facility] room B19-1.  Patient and family agreeable to plans will transport via ems RN to call report to [phone].  Patient's daughter was notified that patient will be discharging to SNF today and she was given the room number.  Palliative to follow outpatient, referral made to [facility], spoke to [place].   Final next level of care: Skilled Nursing Facility Barriers to Discharge: Barriers Resolved   Patient Goals and CMS Choice Patient states their goals for this hospitalization and ongoing recovery are:: To go to SNF for rehab, then follow up with patient's daughter on next plan. CMS Medicare.gov Compare Post Acute Care list provided to:: Patient Represenative (must comment) Choice offered to / list presented to : Adult Children  Discharge Placement              Patient chooses bed at: Other - please specify in the comment section below: [facility] in [place]) Patient to be transferred to facility by: [facility] Name of family member notified: [place], [place] Daughter   [phone] Patient and family notified of of transfer: 03/28/20  Discharge Plan and Services                                     Social Determinants of Health (SDOH) Interventions     Readmission Risk Interventions Readmission Risk Prevention Plan 07/29/2019  Transportation Screening Complete  PCP or Specialist Appt within 5-7 Days Patient refused  Home Care Screening Patient refused  Medication Review (RN CM) Referral to Pharmacy  Some recent data might be hidden

## 2020-03-28 NOTE — Plan of Care (Signed)
  Problem: Education: Goal: Knowledge of General Education information will improve Description Including pain rating scale, medication(s)/side effects and non-pharmacologic comfort measures Outcome: Progressing   Problem: Elimination: Goal: Will not experience complications related to bowel motility Outcome: Progressing Goal: Will not experience complications related to urinary retention Outcome: Progressing   Problem: Safety: Goal: Ability to remain free from injury will improve Outcome: Progressing   Problem: Skin Integrity: Goal: Risk for impaired skin integrity will decrease Outcome: Progressing   

## 2020-03-28 NOTE — Discharge Summary (Signed)
Physician Discharge Summary  Brian Mclaughlin HGD:[id number] DOB: [birth year] DOA: 03/16/2020  PCP: None  Admit date: 03/16/2020 Discharge date: 03/28/2020  Admitted From: Home Disposition: Nursing facility w/palliative care  Recommendations for Outpatient Follow-up:  1. Continue speech therapy evaluations and dietary modifications as indicated. 2. Palliative care must follow along with the patient with suspicion that the patient may aspirate and in that case transfer to hospice would be recommended.  Equipment/Devices: None Discharge Condition: Stable for discharge, guarded prognosis. CODE STATUS: DNR Diet recommendation: Dysphagia 3, nectar thickened liquids, severe aspiration risk  Brief/Interim Summary: Brian Mclaughlin is a 75 y.o. male with a history of AFib, chronic HFrEF, stage IIIb CKD, CVA w/dysarthria, alcohol abuse, and HTN who presented to the ED initially 03/16/2020 with dizziness and fall onto left hip at home. Comprehensive imaging at that time revealed only a wedge compression fracture at L1. He was kept in the ED awaiting SNF bed until he was noted to be coughing consistently, worse with per oral intake on 03/20/2020. Aspiration was suspected and SARS-CoV-2 PCR was positive (had been negative 12/31). CXR revealed mild left base atelectasis on background of cardiomegaly. CRP was found to be 12.8, though WBC remained wnl. PCT 0.11, d-dimer 19.24. Subsequent CTA chest evaluation was limited by artifact with suspicion for chronic PE in right pulmonary artery, otherwise inconclusive. Also noted was mild posterior LLL consolidation with associated atelectasis/infiltrate and very small effusion. Heparin was started in addition to remdesivir, ceftriaxone, azithromycin, and decadron.   Unfortunately the patient developed significant aphasia and right sided hemiparesis/extinction with neuroimaging confirming fairly large left MCA infarct with proximal M2 branch stenosis or occlusion. Neurology was  consulted. Due to severe debility and severe aspiration risk based on speech therapy evaluation, palliative care was consulted. Discussions with the patient's family members confirmed his previously declared desire not to have artificial support/feeding in a scenario such as this. Hospice placement was pursued, though the patient's clinical status has stabilized such that he does not currently require hospice services. He remains a severe aspiration risk, though, so palliative care will follow at facility.   Discharge Diagnoses:  Principal Problem:   COVID-19 virus infection Active Problems:   HTN (hypertension)   Alcohol abuse   Chronic systolic CHF (congestive heart failure) (HCC)   Chronic atrial fibrillation (HCC)   Chronic kidney disease, stage 3b (HCC)   Mixed hyperlipidemia   Acute encephalopathy   Fall   Failure to thrive in adult   Acute cerebrovascular accident (CVA) due to occlusion of left middle cerebral artery (HCC)   Pressure injury of skin  Chronic right pulmonary artery PE: CTA unable to exclude other clots as well. Echocardiogram with normal RV size, function, and normal estimated PA pressure.   - Holding anticoagulation. Per discussion with neurology, would recommend delay in initiation of anticoagulation in 2 weeks given appearance of stroke on neuroimaging. Even then, would engage family with goals of care discussions before additional medications.  Fall at home, L1 compression fracture:  - SNF disposition planned  Chronic HFrEF: Not overloaded currently. BNP 268, chronically elevated dating to 2019. - Not pushing GDMT given goals of care discussions, euvolemia, and renal function limitations.  Acute left MCA stroke: With significant debility, proximal left M2 stenosis vs occlusion.  - Initiate statin. Aspirin not started due to appearance of petechial hemorrhage at this time and based on goals of care discussions with family. Remains at risk of aspiration and  should have supervision, slow feeding with upright posture  and all other aspiration precautions. - Requires rehabilitation at SNF  Covid-19 infection: Asymptomatic, did receive remdesivir, decadron 1/4 - 1/7 - Due to mild nature of symptoms, would recommend 10 days isolation from positive test (1/4).  PAF, HTN:  - Hold anticoagulation and rate control agents, remains rate-controlled even bradycardic at times so will not continue beta blocker..   Aspiration pneumonia: No respiratory symptoms or hypoxia at time of discharge.  - s/p CTX, azithromycin 1/4 - 1/6.  AST elevation: Likely related to covid.   Thrombocytopenia: Likely related to covid, though has been present since at least Oct 2021. Remained >100k. Stable.  Stage IIIb CKD: Avoid nephrotoxins.  History of alcohol abuse: No intoxication or withdrawal noted.  RN Pressure Injury Documentation: Pressure Injury 03/23/20 Sacrum Right;Left Stage 1 -  Intact skin with non-blanchable redness of a localized area usually over a bony prominence. (Active)  03/23/20 0113  Location: Sacrum  Location Orientation: Right;Left  Staging: Stage 1 -  Intact skin with non-blanchable redness of a localized area usually over a bony prominence.  Wound Description (Comments):   Present on Admission: Yes   Discharge Instructions  Allergies as of 03/28/2020      Reactions   Codeine Rash   Penicillins Rash      Medication List    STOP taking these medications   carvedilol 3.125 MG tablet Commonly known as: COREG   thiamine 100 MG tablet     TAKE these medications   atorvastatin 20 MG tablet Commonly known as: LIPITOR Take 1 tablet (20 mg total) by mouth at bedtime.       Contact information for after-discharge care    Destination    [facility]  Preferred SNF .   Service: Skilled Nursing Contact information: [address] [place] [address] [phone]                 Allergies   Allergen Reactions  . Codeine Rash  . Penicillins Rash    Consultations:  Neurology  Palliative care  Procedures/Studies: CT ANGIO HEAD W OR WO CONTRAST  Result Date: 03/22/2020 CLINICAL DATA:  Stroke/TIA EXAM: CT ANGIOGRAPHY HEAD AND NECK TECHNIQUE: Multidetector CT imaging of the head and neck was performed using the standard protocol during bolus administration of intravenous contrast. Multiplanar CT image reconstructions and MIPs were obtained to evaluate the vascular anatomy. Carotid stenosis measurements (when applicable) are obtained utilizing NASCET criteria, using the distal internal carotid diameter as the denominator. CONTRAST:  80mL OMNIPAQUE IOHEXOL 350 MG/ML SOLN COMPARISON:  MRI and CT March 21, 2020.  MRA 03/10/18. FINDINGS: CT HEAD FINDINGS Brain: Progressive edema in the left temporal lobe, compatible with evolving infarct. No substantial mass effect. No midline shift. Mild hyperdensity in the infarct, likely representing petechial hemorrhage. Small focus of hyperdensity in the region of the posterior left sylvian fissure (series 5, image 16), which may represent trace subarachnoid hemorrhage. Additional scattered white matter hypoattenuation, likely related to chronic microvascular ischemic disease. Generalized cerebral volume loss with ex vacuo ventricular dilation. No hydrocephalus. No mass lesion. Vascular: Calcific atherosclerosis. Further evaluated on CTA portion of exam Skull: No acute fracture. Sinuses: Sinuses are clear. Right greater than left mastoid effusions. Orbits: Unremarkable orbits. CTA NECK FINDINGS Aortic arch: Large vessel origins are patent. Right carotid system: Small right internal carotid artery. There is moderate atherosclerotic narrowing of the internal carotid artery near the skull base. Left carotid system: Calcified and noncalcified atherosclerosis at the carotid bifurcation without evidence of greater than  50% narrowing. Vertebral arteries: Left  dominant. Severe stenosis of the left vertebral artery origin. Otherwise, no significant stenosis (50% or greater) or occlusion. Skeleton: No acute findings. Moderate to severe degenerative disc disease at C5-C6 and C6-C7. Other neck: No mass or suspicious adenopathy. Upper chest: No acute findings. Review of the MIP images confirms the above findings CTA HEAD FINDINGS Anterior circulation: Bilateral cavernous and paraclinoid carotid calcific atherosclerosis. Moderate stenosis of the right paraclinoid ICA. Bilateral M1 MCAs are patent. Dominant left A1 ACA with absent right A1 ACA, similar to prior. The left A1 ACA and bilateral A2 ACAs are patent. Suspected occlusion versus high-grade stenosis of a proximal left M2 MCA branch (see series 11, image 275; series 12, images 103/104; and series 13, images). Additional multifocal mild to moderate stenoses of M2 MCA branches. Posterior circulation: No significant stenosis, proximal occlusion, aneurysm, or vascular malformation.w Venous sinuses: As permitted by contrast timing, patent. Review of the MIP images confirms the above findings IMPRESSION: 1. Evolving left temporal infarct without substantial mass effect. Small areas of hyperdensity within the infarct likely represent petechial hemorrhage. Additionally, there is a small focus of hyperdensity in the region of the posterior left sylvian fissure, possibly representing trace subarachnoid hemorrhage versus cortical petechial hemorrhage. 2. Suspected occlusion versus high-grade stenosis of a proximal left M2 MCA branch, as detailed above. 3. Severe stenosis of the left vertebral artery origin. 4. Moderate stenosis of the right ICA at the skull base and the right paraclinoid ICA. Electronically Signed   By: Feliberto Harts MD   On: 03/22/2020 10:11   DG Chest 1 View  Result Date: 03/20/2020 CLINICAL DATA:  Failure to thrive.  Coughing. EXAM: CHEST  1 VIEW COMPARISON:  None. FINDINGS: Cardiomegaly. Chronic aortic  atherosclerotic calcification. Right lung is clear. Question mild atelectasis at the left lung base. Evidence of heart failure or effusion. IMPRESSION: Question mild atelectasis at the left lung base. Cardiomegaly. Aortic atherosclerosis. Electronically Signed   By: Paulina Fusi M.D.   On: 03/20/2020 11:11   DG Lumbar Spine Complete  Result Date: 03/16/2020 CLINICAL DATA:  Fall EXAM: LUMBAR SPINE - COMPLETE 4+ VIEW COMPARISON:  None. FINDINGS: There is a wedge compression fracture of L1. Multilevel mild height loss and facet arthrosis. Alignment is normal. Calcific aortic atherosclerosis. IMPRESSION: Wedge compression fracture of L1, age indeterminate. CT would be helpful for further characterization. Electronically Signed   By: Deatra Robinson M.D.   On: 03/16/2020 02:10   CT HEAD WO CONTRAST  Result Date: 03/21/2020 CLINICAL DATA:  Altered mental status.  COVID-19 positive. EXAM: CT HEAD WITHOUT CONTRAST TECHNIQUE: Contiguous axial images were obtained from the base of the skull through the vertex without intravenous contrast. COMPARISON:  CT head dated March 16, 2020. FINDINGS: Brain: New hypodensity in the left temporal lobe with loss of the gray-white matter differentiation and sulcal effacement. Chronic infarcts in the left superior frontal gyrus, left parietal lobe, and left occipital lobe again noted. Chronic lacunar infarcts in the right basal ganglia, right corona radiata, right pons, and left cerebellum again noted. Unchanged moderate atrophy and severe chronic microvascular ischemic changes. Vascular: Calcified atherosclerosis at the skullbase. No hyperdense vessel. Skull: Normal. Negative for fracture or focal lesion. Sinuses/Orbits: No acute finding. Chronic opacification of the right mastoid air cells. Other: None. IMPRESSION: 1. New hypodensity in the left temporal lobe with loss of the gray-white matter differentiation and sulcal effacement, concerning for acute to subacute infarct. 2.  Unchanged atrophy, severe chronic microvascular ischemic changes,  and multiple chronic infarcts. Electronically Signed   By: Obie Dredge M.D.   On: 03/21/2020 12:16   CT Head Wo Contrast  Result Date: 03/16/2020 CLINICAL DATA:  Fall EXAM: CT HEAD WITHOUT CONTRAST TECHNIQUE: Contiguous axial images were obtained from the base of the skull through the vertex without intravenous contrast. COMPARISON:  None. FINDINGS: Brain: No evidence of acute territorial infarction, hemorrhage, hydrocephalus,extra-axial collection or mass lesion/mass effect. There is dilatation the ventricles and sulci consistent with age-related atrophy. Low-attenuation changes in the deep white matter consistent with small vessel ischemia. Vascular: No hyperdense vessel or unexpected calcification. Skull: The skull is intact. No fracture or focal lesion identified. Sinuses/Orbits: The visualized paranasal sinuses and mastoid air cells are clear. The orbits and globes intact. Other: None Cervical spine: Alignment: There is straightening of the normal cervical lordosis. Skull base and vertebrae: Visualized skull base is intact. No atlanto-occipital dissociation. The vertebral body heights are well maintained. No fracture or pathologic osseous lesion seen. Soft tissues and spinal canal: The visualized paraspinal soft tissues are unremarkable. No prevertebral soft tissue swelling is seen. The spinal canal is grossly unremarkable, no large epidural collection or significant canal narrowing. Disc levels: Multilevel cervical spine spondylosis is seen with disc osteophyte complex and uncovertebral osteophytes with anterior osteophytes present. This is most notable at C5-C6 with severe left neural foraminal narrowing and mild to moderate central canal stenosis. Upper chest: The lung apices are clear. Thoracic inlet is within normal limits. Other: None IMPRESSION: No acute intracranial abnormality. Findings consistent with age related atrophy and  chronic small vessel ischemia No acute fracture or malalignment of the spine. Electronically Signed   By: Jonna Clark M.D.   On: 03/16/2020 01:49   CT ANGIO NECK W OR WO CONTRAST  Result Date: 03/22/2020 CLINICAL DATA:  Stroke/TIA EXAM: CT ANGIOGRAPHY HEAD AND NECK TECHNIQUE: Multidetector CT imaging of the head and neck was performed using the standard protocol during bolus administration of intravenous contrast. Multiplanar CT image reconstructions and MIPs were obtained to evaluate the vascular anatomy. Carotid stenosis measurements (when applicable) are obtained utilizing NASCET criteria, using the distal internal carotid diameter as the denominator. CONTRAST:  80mL OMNIPAQUE IOHEXOL 350 MG/ML SOLN COMPARISON:  MRI and CT March 21, 2020.  MRA 03/10/18. FINDINGS: CT HEAD FINDINGS Brain: Progressive edema in the left temporal lobe, compatible with evolving infarct. No substantial mass effect. No midline shift. Mild hyperdensity in the infarct, likely representing petechial hemorrhage. Small focus of hyperdensity in the region of the posterior left sylvian fissure (series 5, image 16), which may represent trace subarachnoid hemorrhage. Additional scattered white matter hypoattenuation, likely related to chronic microvascular ischemic disease. Generalized cerebral volume loss with ex vacuo ventricular dilation. No hydrocephalus. No mass lesion. Vascular: Calcific atherosclerosis. Further evaluated on CTA portion of exam Skull: No acute fracture. Sinuses: Sinuses are clear. Right greater than left mastoid effusions. Orbits: Unremarkable orbits. CTA NECK FINDINGS Aortic arch: Large vessel origins are patent. Right carotid system: Small right internal carotid artery. There is moderate atherosclerotic narrowing of the internal carotid artery near the skull base. Left carotid system: Calcified and noncalcified atherosclerosis at the carotid bifurcation without evidence of greater than 50% narrowing. Vertebral  arteries: Left dominant. Severe stenosis of the left vertebral artery origin. Otherwise, no significant stenosis (50% or greater) or occlusion. Skeleton: No acute findings. Moderate to severe degenerative disc disease at C5-C6 and C6-C7. Other neck: No mass or suspicious adenopathy. Upper chest: No acute findings. Review of the MIP images  confirms the above findings CTA HEAD FINDINGS Anterior circulation: Bilateral cavernous and paraclinoid carotid calcific atherosclerosis. Moderate stenosis of the right paraclinoid ICA. Bilateral M1 MCAs are patent. Dominant left A1 ACA with absent right A1 ACA, similar to prior. The left A1 ACA and bilateral A2 ACAs are patent. Suspected occlusion versus high-grade stenosis of a proximal left M2 MCA branch (see series 11, image 275; series 12, images 103/104; and series 13, images). Additional multifocal mild to moderate stenoses of M2 MCA branches. Posterior circulation: No significant stenosis, proximal occlusion, aneurysm, or vascular malformation.w Venous sinuses: As permitted by contrast timing, patent. Review of the MIP images confirms the above findings IMPRESSION: 1. Evolving left temporal infarct without substantial mass effect. Small areas of hyperdensity within the infarct likely represent petechial hemorrhage. Additionally, there is a small focus of hyperdensity in the region of the posterior left sylvian fissure, possibly representing trace subarachnoid hemorrhage versus cortical petechial hemorrhage. 2. Suspected occlusion versus high-grade stenosis of a proximal left M2 MCA branch, as detailed above. 3. Severe stenosis of the left vertebral artery origin. 4. Moderate stenosis of the right ICA at the skull base and the right paraclinoid ICA. Electronically Signed   By: Feliberto HartsFrederick S Jones MD   On: 03/22/2020 10:11   CT ANGIO CHEST PE W OR WO CONTRAST  Result Date: 03/20/2020 CLINICAL DATA:  Shortness of breath.  COVID positive. EXAM: CT ANGIOGRAPHY CHEST WITH  CONTRAST TECHNIQUE: Multidetector CT imaging of the chest was performed using the standard protocol during bolus administration of intravenous contrast. Multiplanar CT image reconstructions and MIPs were obtained to evaluate the vascular anatomy. CONTRAST:  75mL OMNIPAQUE IOHEXOL 350 MG/ML SOLN COMPARISON:  February 02, 2012 FINDINGS: Cardiovascular: The segmental and subsegmental pulmonary arteries are limited in evaluation secondary to patient motion and suboptimal opacification with intravenous contrast. This is most prominent within the bilateral lower lobes. A thin linear focus of intraluminal low attenuation is seen within the distal aspect of the right pulmonary artery (axial CT images 139 through 143, CT series number 5). There is mild cardiomegaly with marked severity coronary artery calcification. No pericardial effusion. Mediastinum/Nodes: No enlarged mediastinal, hilar, or axillary lymph nodes. Thyroid gland, trachea, and esophagus demonstrate no significant findings. Lungs/Pleura: A mild amount of consolidation is seen along the posterior aspect of the left lower lobe. A small amount of atelectasis and/or infiltrate is also seen along the lateral aspect of the left lower lobe. A very small left pleural effusion is noted. No pneumothorax is identified. Upper Abdomen: The visualized portion of the upper pole of the left kidney is atrophic in appearance. Musculoskeletal: Multilevel degenerative changes seen throughout the thoracic spine Review of the MIP images confirms the above findings. IMPRESSION: 1. Limited evaluation of the pulmonary arteries, as described above. Given the degree of visible artifact, acute pulmonary embolism cannot be excluded. Correlation with follow-up chest CTA is recommended. 2. 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 3. Mild posterior left lower lobe consolidation with additional small areas of left lower  lobe atelectasis and/or infiltrate. 4. Very small left pleural effusion. Electronically Signed   By: Aram Candelahaddeus  Houston M.D.   On: 03/20/2020 19:27   CT Cervical Spine Wo Contrast  Result Date: 03/16/2020 CLINICAL DATA:  Fall EXAM: CT HEAD WITHOUT CONTRAST TECHNIQUE: Contiguous axial images were obtained from the base of the skull through the vertex without intravenous contrast. COMPARISON:  None. FINDINGS: Brain: No evidence of acute territorial infarction, hemorrhage,  hydrocephalus,extra-axial collection or mass lesion/mass effect. There is dilatation the ventricles and sulci consistent with age-related atrophy. Low-attenuation changes in the deep white matter consistent with small vessel ischemia. Vascular: No hyperdense vessel or unexpected calcification. Skull: The skull is intact. No fracture or focal lesion identified. Sinuses/Orbits: The visualized paranasal sinuses and mastoid air cells are clear. The orbits and globes intact. Other: None Cervical spine: Alignment: There is straightening of the normal cervical lordosis. Skull base and vertebrae: Visualized skull base is intact. No atlanto-occipital dissociation. The vertebral body heights are well maintained. No fracture or pathologic osseous lesion seen. Soft tissues and spinal canal: The visualized paraspinal soft tissues are unremarkable. No prevertebral soft tissue swelling is seen. The spinal canal is grossly unremarkable, no large epidural collection or significant canal narrowing. Disc levels: Multilevel cervical spine spondylosis is seen with disc osteophyte complex and uncovertebral osteophytes with anterior osteophytes present. This is most notable at C5-C6 with severe left neural foraminal narrowing and mild to moderate central canal stenosis. Upper chest: The lung apices are clear. Thoracic inlet is within normal limits. Other: None IMPRESSION: No acute intracranial abnormality. Findings consistent with age related atrophy and chronic small  vessel ischemia No acute fracture or malalignment of the spine. Electronically Signed   By: Jonna ClarkBindu  Avutu M.D.   On: 03/16/2020 01:49   CT Lumbar Spine Wo Contrast  Result Date: 03/16/2020 CLINICAL DATA:  Fall with low back pain EXAM: CT LUMBAR SPINE WITHOUT CONTRAST TECHNIQUE: Multidetector CT imaging of the lumbar spine was performed without intravenous contrast administration. Multiplanar CT image reconstructions were also generated. COMPARISON:  Lumbar spine radiograph 03/16/2020 FINDINGS: Segmentation: 5 lumbar type vertebrae. Alignment: Normal. Vertebrae: Wedge compression fracture of L1 with approximately 10% height loss. No retropulsion. No extension to the posterior elements. No other Paraspinal and other soft tissues: Calcific aortic atherosclerosis. Disc levels: L2-3: Moderate facet hypertrophy with moderate right foraminal stenosis. L3-4: Mild facet hypertrophy with mild bilateral foraminal stenosis. L4-5: Moderate facet hypertrophy with mild right and moderate left foraminal stenosis. L5-S1: Severe facet hypertrophy. No spinal canal or neural foraminal stenosis. IMPRESSION: 1. Wedge compression fracture of L1 with approximately 10% height loss, but no retropulsion or extension to the posterior elements. 2. Moderate right L2-3 and left L4-5 foraminal stenosis. Aortic Atherosclerosis (ICD10-I70.0). Electronically Signed   By: Deatra RobinsonKevin  Herman M.D.   On: 03/16/2020 03:36   MR BRAIN WO CONTRAST  Result Date: 03/21/2020 CLINICAL DATA:  Initial evaluation for acute neuro deficit, stroke suspected, mental status change. EXAM: MRI HEAD WITHOUT CONTRAST TECHNIQUE: Multiplanar, multiecho pulse sequences of the brain and surrounding structures were obtained without intravenous contrast. COMPARISON:  Prior head CT from earlier the same day. FINDINGS: Brain: Examination severely degraded by motion artifact, limiting assessment. Generalized age-related cerebral atrophy. Extensive patchy and confluent T2/FLAIR  hyperintensity within the periventricular deep white matter both cerebral hemispheres most consistent with chronic small vessel ischemic disease, advanced in nature. Small remote lacunar infarcts noted at the right basal ganglia and possibly left basal ganglia and thalamus as well. Few additional remote lacunar infarcts at the right pons. Small focus of encephalomalacia at the paramedian left occipital lobe consistent with a chronic left PCA territory infarct. Additional chronic left ACA territory infarct present at the high parasagittal left frontal lobe. Moderate to large area of confluent restricted diffusion seen involving the left temporal lobe, consistent with an acute left MCA distribution infarct. Patchy involvement of the insula. Posterior extension to involve the left parieto-occipital region. Suspected associated petechial  hemorrhage, difficult to see on this exam, although is likely evident on prior CT. No frank hemorrhagic transformation or significant regional mass effect. No other foci of restricted diffusion to suggest acute or subacute ischemia. Gray-white matter differentiation otherwise maintained. No visible acute intracranial hemorrhage on this motion degraded exam. No mass lesion, midline shift, or significant mass effect. Diffuse ventricular prominence related to global parenchymal volume loss without hydrocephalus. No extra-axial fluid collection. Pituitary gland and suprasellar region normal. Midline structures intact. Vascular: Major intracranial vascular flow voids are grossly maintained at the skull base. Skull and upper cervical spine: Craniocervical junction within normal limits. Bone marrow signal intensity normal. No scalp soft tissue abnormality. Sinuses/Orbits: Globes and orbital soft tissues grossly within normal limits. Mild mucosal thickening noted within the right maxillary sinus. Paranasal sinuses are otherwise clear. Bilateral mastoid effusions noted. Visualized nasopharynx  grossly unremarkable. Other: None. IMPRESSION: 1. Technically limited exam due to extensive motion artifact. 2. Moderate to large sized acute left MCA distribution infarct, primarily involving the left temporal lobe as above. Suspected associated petechial hemorrhage without frank hemorrhagic transformation or significant regional mass effect. 3. Underlying age-related cerebral atrophy with advanced chronic microvascular ischemic disease, with superimposed chronic left PCA and ACA territory infarcts. Electronically Signed   By: Rise Mu M.D.   On: 03/21/2020 23:16   DG Swallowing Func-Speech Pathology  Result Date: 03/27/2020 Objective Swallowing Evaluation: Type of Study: MBS-Modified Barium Swallow Study  Patient Details Name: BRICK KETCHER MRN: [id number] Date of Birth: [birth year] Today's Date: 03/27/2020 Time: SLP Start Time (ACUTE ONLY): 1501 -SLP Stop Time (ACUTE ONLY): 1545 SLP Time Calculation (min) (ACUTE ONLY): 44 min Past Medical History: Past Medical History: Diagnosis Date . Abnormality of gait 06/14/2014 . Acute CVA (cerebrovascular accident) (HCC) 05/24/2014 . Acute kidney failure, unspecified (HCC)  . Acute, but ill-defined, cerebrovascular disease  . Alcohol abuse  . Altered mental status  . Atrial fibrillation (HCC)  . Chronic systolic CHF (congestive heart failure) (HCC)  . CVA (cerebral infarction)  . ETOH abuse  . Hyperlipidemia  . Hypertension  . Nonspecific elevation of levels of transaminase or lactic acid dehydrogenase (LDH)  . Rhabdomyolysis  . Tobacco abuse  . Tobacco use disorder  Past Surgical History: Past Surgical History: Procedure Laterality Date . BACK SURGERY  10 years ago  "slipped disc" repair . EYE MUSCLE SURGERY    as a teenager "tighten his muscles" HPI: AFib, chronic HFrEF, stage IIIb CKD, CVA w/dysarthria, alcohol abuse, and HTN who presented to the ED initially 03/16/2020 with dizziness and fall onto left hip at home. Comprehensive imaging at that time revealed  only a wedge compression fracture at L1. He was kept in the ED awaiting SNF bed until he was noted to be coughing consistently, worse with per oral intake on 03/20/2020. Aspiration was suspected and SARS-CoV-2 PCR was positive (had been negative 12/31).   Pt found to have large Left MCA CVA, Imaging of chest showed ? chronic right pulmonary artery PE, mild LLL consolidation with assoc ATX/infiltration and small effusion.  BSE completed and patient was going comfort care so SLP s/o. POC now changed as patient does not qualify for residential hospice. In addition, patient reportedly eating and drinking better, so new BSE was ordered.  Subjective: awake Assessment / Plan / Recommendation CHL IP CLINICAL IMPRESSIONS 03/27/2020 Clinical Impression Moderately severe oropharyngeal dysphagia with sensorimotor deficits.  Premature spillage of all boluses into pharynx *(poorly controlled) noted, in addition to oral retention of liquids with anterior loss  on right and lack of mastication of solids requiring SLP to remove bolus from his oral cavity.  Pharyngeal swallow c/b DELAY with barium pooling at pyriform sinus prior to swallow trigger -varying from 3-11 seconds (and longer with nectar via tsp.)  Suspect smaller bolus size was inadequate for sensation/pharyngeal trigger.  Pt did have mild aspiration (SILENT) of thin and nectar as barium spilled into open larynx from pyriform with swallow.  Lying chair back approx 25-30* allowed barium to pool posterior and prevented aspiration. Pt's aspiration and malnutrition risk will be chronic in this SLPs opinion, however question impact of barium on testing performance.  Will follow up for dysphagia management. Based on MBS, recommend dys1/nectar and allow pt tsps of thin. Frequent oral suctioning and assuring pt swallows is paramount. SLP Visit Diagnosis -- Attention and concentration deficit following -- Frontal lobe and executive function deficit following -- Impact on safety and  function Moderate aspiration risk;Risk for inadequate nutrition/hydration   CHL IP TREATMENT RECOMMENDATION 05/26/2014 Treatment Recommendations Therapy as outlined in treatment plan below   Prognosis 03/27/2020 Prognosis for Safe Diet Advancement Guarded Barriers to Reach Goals Severity of deficits;Time post onset;Cognitive deficits Barriers/Prognosis Comment current level of dysphagia CHL IP DIET RECOMMENDATION 03/27/2020 SLP Diet Recommendations Dysphagia 1 (Puree) solids;Nectar thick liquid Liquid Administration via -- Medication Administration -- Compensations Slow rate;Small sips/bites;Minimize environmental distractions;Lingual sweep for clearance of pocketing;Other (Comment) Postural Changes Remain semi-upright after after feeds/meals (Comment);Seated upright at 90 degrees   CHL IP OTHER RECOMMENDATIONS 03/27/2020 Recommended Consults -- Oral Care Recommendations Oral care before and after PO Other Recommendations Order thickener from pharmacy;Have oral suction available   CHL IP FOLLOW UP RECOMMENDATIONS 03/27/2020 Follow up Recommendations Skilled Nursing facility   [facility] IP FREQUENCY AND DURATION 03/27/2020 Speech Therapy Frequency (ACUTE ONLY) min 2x/week Treatment Duration 1 week      CHL IP ORAL PHASE 03/27/2020 Oral Phase Impaired Oral - Pudding Teaspoon -- Oral - Pudding Cup -- Oral - Honey Teaspoon -- Oral - Honey Cup -- Oral - Nectar Teaspoon Premature spillage;Weak lingual manipulation Oral - Nectar Cup Premature spillage;Weak lingual manipulation;Decreased bolus cohesion Oral - Nectar Straw Premature spillage;Weak lingual manipulation;Decreased bolus cohesion Oral - Thin Teaspoon Premature spillage;Weak lingual manipulation;Decreased bolus cohesion Oral - Thin Cup Premature spillage;Weak lingual manipulation;Decreased bolus cohesion;Lingual/palatal residue;Right anterior bolus loss Oral - Thin Straw Premature spillage;Weak lingual manipulation;Decreased bolus cohesion;Lingual/palatal residue;Right  anterior bolus loss Oral - Puree Premature spillage;Weak lingual manipulation Oral - Mech Soft Impaired mastication;Weak lingual manipulation Oral - Regular -- Oral - Multi-Consistency -- Oral - Pill -- Oral Phase - Comment --  CHL IP PHARYNGEAL PHASE 03/27/2020 Pharyngeal Phase Impaired Pharyngeal- Pudding Teaspoon -- Pharyngeal -- Pharyngeal- Pudding Cup -- Pharyngeal -- Pharyngeal- Honey Teaspoon -- Pharyngeal -- Pharyngeal- Honey Cup -- Pharyngeal -- Pharyngeal- Nectar Teaspoon Delayed swallow initiation-pyriform sinuses Pharyngeal Material does not enter airway Pharyngeal- Nectar Cup Delayed swallow initiation-pyriform sinuses Pharyngeal Material does not enter airway;Material enters airway, CONTACTS cords and not ejected out Pharyngeal- Nectar Straw NT Pharyngeal -- Pharyngeal- Thin Teaspoon Delayed swallow initiation-pyriform sinuses Pharyngeal Material does not enter airway Pharyngeal- Thin Cup Delayed swallow initiation-pyriform sinuses;Penetration/Aspiration during swallow Pharyngeal Material enters airway, CONTACTS cords and then ejected out;Material enters airway, passes BELOW cords without attempt by patient to eject out (silent aspiration) Pharyngeal- Thin Straw Delayed swallow initiation-pyriform sinuses Pharyngeal Material enters airway, passes BELOW cords without attempt by patient to eject out (silent aspiration);Material enters airway, CONTACTS cords and not ejected out Pharyngeal- Puree Delayed swallow initiation-vallecula Pharyngeal  Material does not enter airway Pharyngeal- Mechanical Soft NT Pharyngeal -- Pharyngeal- Regular -- Pharyngeal -- Pharyngeal- Multi-consistency -- Pharyngeal -- Pharyngeal- Pill -- Pharyngeal -- Pharyngeal Comment Pharyngeal swalliow is delayed from 2 up to 11 seconds - and longer with nectar via tsp *suspect decreased sensory input; Pharyngeal swallow is strong; Trace aspiration of nectar and thin noted without cough reflex; Positioning HOB posterior approx 25*  helpful to prevent aspiration as it allowed spillage of barium posterior into pharynx and prevented aspiration;  His aspiration risk will be chronic due to his level of dysphagia and his aphasia preventing him from following directions.  CHL IP CERVICAL ESOPHAGEAL PHASE 03/27/2020 Cervical Esophageal Phase WFL Pudding Teaspoon -- Pudding Cup -- Honey Teaspoon -- Honey Cup -- Nectar Teaspoon -- Nectar Cup -- Nectar Straw -- Thin Teaspoon -- Thin Cup -- Thin Straw -- Puree -- Mechanical Soft -- Regular -- Multi-consistency -- Pill -- Cervical Esophageal Comment -- Rolena Infante, MS [facility] SLP Acute Rehab Services Office [phone] Pager [phone] Chales Abrahams 03/27/2020, 6:05 PM              DG Hip Unilat W or Wo Pelvis 2-3 Views Left  Result Date: 03/16/2020 CLINICAL DATA:  Fall EXAM: DG HIP (WITH OR WITHOUT PELVIS) 2-3V LEFT COMPARISON:  None. FINDINGS: There is no evidence of hip fracture or dislocation. There is no evidence of arthropathy or other focal bone abnormality. IMPRESSION: Negative. Electronically Signed   By: Deatra Robinson M.D.   On: 03/16/2020 02:08   ECHOCARDIOGRAM LIMITED  Result Date: 03/21/2020    ECHOCARDIOGRAM LIMITED REPORT   Patient Name:   Brian Mclaughlin Date of Exam: 03/21/2020 Medical Rec #:  [id number]   Height:       72.0 in Accession #:    [id number]  Weight:       205.0 lb Date of Birth:  [birth year]  BSA:          2.153 m Patient Age:    74 years    BP:           150/98 mmHg Patient Gender: M           HR:           67 bpm. Exam Location:  Inpatient Procedure: Limited Echo, Cardiac Doppler, Color Doppler and Intracardiac            Opacification Agent Indications:    CHF, pulmonary Embolus  History:        Patient has prior history of Echocardiogram examinations. CHF,                 Stroke, Arrythmias:Atrial Fibrillation; Risk                 Factors:Hypertension and Dyslipidemia. COVID +, CKD, alcohol                 abuse.  Sonographer:    Lavenia Atlas Referring Phys: [phone]  Tyrone Nine IMPRESSIONS  1. Left ventricular ejection fraction, by estimation, is 45 to 50%. The left ventricle has mildly decreased function. The left ventricle demonstrates global hypokinesis. There is mild concentric left ventricular hypertrophy. Left ventricular diastolic function could not be evaluated.  2. Right ventricular systolic function is normal. The right ventricular size is normal. There is normal pulmonary artery systolic pressure.  3. The mitral valve is normal in structure. Mild mitral valve regurgitation. No evidence of mitral stenosis.  4. The aortic valve is normal in structure. Aortic  valve regurgitation is mild. No aortic stenosis is present.  5. There is mild dilatation of the aortic root and at the level of the sinuses of Valsalva, measuring 45 mm.  6. The inferior vena cava is normal in size with greater than 50% respiratory variability, suggesting right atrial pressure of 3 mmHg. FINDINGS  Left Ventricle: Left ventricular ejection fraction, by estimation, is 45 to 50%. The left ventricle has mildly decreased function. The left ventricle demonstrates global hypokinesis. Definity contrast agent was given IV to delineate the left ventricular  endocardial borders. The left ventricular internal cavity size was normal in size. There is mild concentric left ventricular hypertrophy. Left ventricular diastolic function could not be evaluated. Left ventricular diastolic function could not be evaluated due to atrial fibrillation. Right Ventricle: The right ventricular size is normal. No increase in right ventricular wall thickness. Right ventricular systolic function is normal. There is normal pulmonary artery systolic pressure. The tricuspid regurgitant velocity is 2.21 m/s, and  with an assumed right atrial pressure of 3 mmHg, the estimated right ventricular systolic pressure is 22.5 mmHg. Left Atrium: Left atrial size was normal in size. Right Atrium: Right atrial size was normal in size.  Pericardium: There is no evidence of pericardial effusion. Mitral Valve: The mitral valve is normal in structure. Mild mitral valve regurgitation. No evidence of mitral valve stenosis. Tricuspid Valve: The tricuspid valve is normal in structure. Tricuspid valve regurgitation is mild . No evidence of tricuspid stenosis. Aortic Valve: The aortic valve is normal in structure. Aortic valve regurgitation is mild. Aortic regurgitation PHT measures 852 msec. No aortic stenosis is present. Pulmonic Valve: The pulmonic valve was normal in structure. Pulmonic valve regurgitation is not visualized. No evidence of pulmonic stenosis. Aorta: The aortic root is normal in size and structure. There is mild dilatation of the aortic root and at the level of the sinuses of Valsalva, measuring 45 mm. Venous: The inferior vena cava is normal in size with greater than 50% respiratory variability, suggesting right atrial pressure of 3 mmHg. IAS/Shunts: No atrial level shunt detected by color flow Doppler. LEFT VENTRICLE PLAX 2D LVIDd:         5.50 cm LVIDs:         4.00 cm LV PW:         1.30 cm LV IVS:        1.30 cm LVOT diam:     2.60 cm LV SV:         57 LV SV Index:   26 LVOT Area:     5.31 cm  LEFT ATRIUM           Index LA diam:      3.90 cm 1.81 cm/m LA Vol (A4C): 41.3 ml 19.18 ml/m  AORTIC VALVE LVOT Vmax:   59.40 cm/s LVOT Vmean:  37.800 cm/s LVOT VTI:    0.107 m AI PHT:      852 msec  AORTA Ao Root diam: 4.50 cm MITRAL VALVE               TRICUSPID VALVE MV Area (PHT): 2.87 cm    TR Peak grad:   19.5 mmHg MV Decel Time: 264 msec    TR Vmax:        221.00 cm/s MV E velocity: 49.70 cm/s MV A velocity: 27.00 cm/s  SHUNTS MV E/A ratio:  1.84        Systemic VTI:  0.11 m  Systemic Diam: 2.60 cm Armanda Magic MD Electronically signed by Armanda Magic MD Signature Date/Time: 03/21/2020/4:51:44 PM    Final    Subjective: Patient incoherent and minimally verbal. He will accept po when offered though does seem  to cough intermittently and requires supervision.  Discharge Exam: Vitals:   03/27/20 0556 03/27/20 2211  BP: (!) 159/89 117/67  Pulse: (!) 58 (!) 46  Resp: 18   Temp: 98.6 F (37 C) (!) 96.6 F (35.9 C)  SpO2: 96% 99%   General: Pt is alert in no distress Neuro: Right hemiparesis and extinction, leftward gaze, aphasia seems to be both expressive > receptive but difficult to tell. Overall, exam stable from prior.  Labs: BNP (last 3 results) Recent Labs    03/20/20 1432  BNP 268.0*   Basic Metabolic Panel: Recent Labs  Lab 03/22/20 0528 03/23/20 0511  NA 135 138  K 4.3 4.5  CL 104 108  CO2 20* 21*  GLUCOSE 121* 118*  BUN 53* 64*  CREATININE 2.22* 2.14*  CALCIUM 7.8* 7.8*  MG 2.4 2.4   Liver Function Tests: Recent Labs  Lab 03/22/20 0528 03/23/20 0511  AST 73* 65*  ALT 25 24  ALKPHOS 50 48  BILITOT 0.5 0.6  PROT 6.1* 5.6*  ALBUMIN 2.9* 2.6*   CBC: Recent Labs  Lab 03/22/20 0528 03/23/20 0511  WBC 10.7* 12.0*  NEUTROABS 8.0* 9.8*  HGB 11.2* 9.1*  HCT 33.8* 27.7*  MCV 99.7 100.0  PLT 130* 112*   CBG: Recent Labs  Lab 03/25/20 2122  GLUCAP 106*   Urinalysis    Component Value Date/Time   COLORURINE YELLOW 03/16/2020 0300   APPEARANCEUR HAZY (A) 03/16/2020 0300   LABSPEC 1.030 03/16/2020 0300   PHURINE 5.0 03/16/2020 0300   GLUCOSEU NEGATIVE 03/16/2020 0300   HGBUR NEGATIVE 03/16/2020 0300   BILIRUBINUR NEGATIVE 03/16/2020 0300   KETONESUR NEGATIVE 03/16/2020 0300   PROTEINUR 100 (A) 03/16/2020 0300   UROBILINOGEN 0.2 05/24/2014 0548   NITRITE NEGATIVE 03/16/2020 0300   LEUKOCYTESUR TRACE (A) 03/16/2020 0300    Time coordinating discharge: Approximately 40 minutes  Tyrone Nine, MD  [facility] 03/28/2020, 11:03 AM

## 2020-03-28 NOTE — Progress Notes (Signed)
Attempted to call report to [facility]

## 2020-03-28 NOTE — Evaluation (Signed)
Physical Therapy Re-Evaluation Patient Details Name: Brian Mclaughlin MRN: [id number] DOB: [birth year] Today's Date: 03/28/2020   History of Present Illness  Brian Mclaughlin is 75 y.o male with medical history of CVA, CKD, afib, CHF, HTN, alochol abuse who presents after fall at home with L hip and back pain; CT revealed wedge compression fx of L1.He was kept in the ED awaiting SNF bed until he was noted to be coughing consistently, worse with per oral intake on 03/20/2020. Aspiration was suspected and SARS-CoV-2 PCR was positive.nfortunately the patient developed significant aphasia and right sided hemiparesis/extinction with neuroimaging confirming fairly large left MCA infarct with proximal M2 branch stenosis or occlusion  Clinical Impression  The patient presents with dense right hemiplegia, no noted volitional movement of the right leg. Patient  presents with sever right neglect, does not follow visually towards the right, does not get to midline. Patient demonstrates poor balance responses. Does automatically reach for footboard with left hand upon sitting on bed edge and self supports self briefly with min assistance, then will lose balance and require mod/max assistance to maintain trunk control.e. Did not place TLSO due to patients significant hemiplegia and limited mobility. Pt admitted with above diagnosis. Pt currently with functional limitations due to the deficits listed below (see PT Problem List). Pt will benefit from skilled PT to increase their independence and safety with mobility to allow discharge to the venue listed below.   Patient noted to cough with 1 sip of water. Mumbles with no clear words discerned from patient. Patient does not follow any verbal commands nor visual, even simple "wave good bye" gesture not returned.     Follow Up Recommendations  SNF    Equipment Recommendations    none  Recommendations for Other Services       Precautions / Restrictions  Precautions Precautions: Fall;Back Precaution Comments: did not apply brace for re-ev Required Braces or Orthoses: Spinal Brace Spinal Brace: Thoracolumbosacral orthotic      Mobility  Bed Mobility   Bed Mobility: Supine to Sit;Sit to Supine     Supine to sit: Total assist;+2 for physical assistance;+2 for safety/equipment Sit to supine: Total assist;+2 for physical assistance;+2 for safety/equipment   General bed mobility comments: multimodal cues to move legs to bed edge. Patient does not follow. Total assistyance to  move to bed edge and sit upright, then total back inot bed for trunk and legs.    Transfers Overall transfer level: Needs assistance Equipment used: 2 person hand held assist Transfers: Sit to/from Stand Sit to Stand: Total assist;+2 physical assistance;+2 safety/equipment         General transfer comment: unable to clear the bed to stand, leans heavily to right,  Ambulation/Gait                Stairs            Wheelchair Mobility    Modified Rankin (Stroke Patients Only)       Balance Overall balance assessment: Needs assistance Sitting-balance support: Single extremity supported;Feet supported Sitting balance-Leahy Scale: Poor Sitting balance - Comments: constantly reaches for  foot board to support  with LUE. When  LUE not supporting, poor balnace responses, falling  backward or forward to the right. No attention to the right side/visual field.                                     Pertinent  Vitals/Pain Faces Pain Scale: No hurt Pain Location: patient unable to speak words to indicate, did not notice any grimaces    Home Living Family/patient expects to be discharged to:: Skilled nursing facility                      Prior Function                 Hand Dominance        Extremity/Trunk Assessment   Upper Extremity Assessment Upper Extremity Assessment: Defer to OT evaluation    Lower  Extremity Assessment Lower Extremity Assessment: RLE deficits/detail;LLE deficits/detail RLE Deficits / Details: dense paresis, no volitional movements. ? slight increase in tone. Does not appear to have sensation as noted by deep stimuli does not ellicit any responses on the leg. LLE Deficits / Details: moves leg volitionally when mobilizing    Cervical / Trunk Assessment Cervical / Trunk Assessment: Other exceptions Cervical / Trunk Exceptions: decreased trunk control sitting upright  Communication      Cognition Arousal/Alertness: Awake/alert   Overall Cognitive Status: No family/caregiver present to determine baseline cognitive functioning Area of Impairment: Orientation;Following commands                               General Comments: Patient awake, speech is garbled, no words plain. Does not attend to right past midline, does not follow ant verbal  commands, ? follows simple  tactile/visual  commands inconsistently, with hand over hand cues.      General Comments      Exercises     Assessment/Plan    PT Assessment Patient needs continued PT services  PT Problem List Decreased strength;Decreased mobility;Decreased safety awareness;Impaired tone;Decreased coordination;Decreased knowledge of precautions;Decreased activity tolerance;Decreased cognition;Decreased balance;Impaired sensation       PT Treatment Interventions Therapeutic activities;Cognitive remediation;Therapeutic exercise;Patient/family education;Functional mobility training;Neuromuscular re-education    PT Goals (Current goals can be found in the Care Plan section)  Acute Rehab PT Goals PT Goal Formulation: Patient unable to participate in goal setting Time For Goal Achievement: 04/11/20 Potential to Achieve Goals: Poor    Frequency Min 2X/week   Barriers to discharge        Co-evaluation PT/OT/SLP Co-Evaluation/Treatment: Yes Reason for Co-Treatment: For patient/therapist safety;To  address functional/ADL transfers PT goals addressed during session: Mobility/safety with mobility;Balance         AM-PAC PT "6 Clicks" Mobility  Outcome Measure Help needed turning from your back to your side while in a flat bed without using bedrails?: Total Help needed moving from lying on your back to sitting on the side of a flat bed without using bedrails?: Total Help needed moving to and from a bed to a chair (including a wheelchair)?: Total Help needed standing up from a chair using your arms (e.g., wheelchair or bedside chair)?: Total Help needed to walk in hospital room?: Total Help needed climbing 3-5 steps with a railing? : Total 6 Click Score: 6    End of Session   Activity Tolerance: Patient tolerated treatment well Patient left: in bed;with bed alarm set;with call bell/phone within reach Nurse Communication: Mobility status PT Visit Diagnosis: Other symptoms and signs involving the nervous system (Y10.175)    Time: 1025-8527 PT Time Calculation (min) (ACUTE ONLY): 22 min   Charges:   PT Evaluation $PT Re-evaluation: 1 Re-eval          Blanchard Kelch PT Acute Rehabilitation Services  Pager [phone] Office [phone]   Rada Hay 03/28/2020, 10:39 AM

## 2020-03-28 NOTE — Evaluation (Signed)
Occupational Therapy Evaluation Patient Details Name: Brian Mclaughlin MRN: [id number] DOB: 1946-01-25 Today's Date: 03/28/2020    History of Present Illness Brian Mclaughlin is 75 y.o male with medical history of CVA, CKD, afib, CHF, HTN, alochol abuse who presents after fall at home with L hip and back pain; CT revealed wedge compression fx of L1.He was kept in the ED awaiting SNF bed until he was noted to be coughing consistently, worse with per oral intake on 03/20/2020. Aspiration was suspected and SARS-CoV-2 PCR was positive.nfortunately the patient developed significant aphasia and right sided hemiparesis/extinction with neuroimaging confirming fairly large left MCA infarct with proximal M2 branch stenosis or occlusion   Clinical Impression   Brian Mclaughlin is a 76 year old man who presents with dense hemiplegia of right dominant side, impaired balance, visual deficits, cognitive impairments, and global aphasia resulting in a significant decline in ability to perform ADLs and functional mobility. Patient does not exhibit any active movement of right sided extremities, appears to lack sensation as he does not respond to painful stimuli on either arm or leg, exhibits neglect and inability to visually track or attend past midline. Patient requiring max-total assist for transfers, impaired sitting balance and inability to stand today despite assistance of two. Patient unable to follow most commands - despite verbal, visual and tactile cues resulting in difficulty with patient assisting with ADLs. Patient was able to take a sip of water through a straw and demonstrate the use of a tooth brush grossly with hand over hand from therapist. Patient's speech is nonsensical - made up of sounds and syllables but not real words. Patient will benefit from skilled OT services while in hospital to improve deficits and learn compensatory strategies as needed in order to improve functional abilities in order to reduce caregiver  burden.     Follow Up Recommendations  SNF    Equipment Recommendations       Recommendations for Other Services       Precautions / Restrictions Precautions Precautions: Fall;Back Precaution Comments: did not apply brace for re-ev Required Braces or Orthoses: Spinal Brace Spinal Brace: Thoracolumbosacral orthotic Restrictions Weight Bearing Restrictions: Yes Other Position/Activity Restrictions: not specified  when to [facility]      Mobility Bed Mobility Overal bed mobility: Needs Assistance Bed Mobility: Supine to Sit;Sit to Supine Rolling: Max assist   Supine to sit: Total assist;+2 for physical assistance;+2 for safety/equipment Sit to supine: Total assist;+2 for physical assistance;+2 for safety/equipment   General bed mobility comments: multimodal cues to move legs to bed edge. Patient does not follow. Total assistance to  move to bed edge and sit upright, then total back inot bed for trunk and legs.    Transfers Overall transfer level: Needs assistance Equipment used: 2 person hand held assist Transfers: Sit to/from Stand Sit to Stand: Total assist;+2 physical assistance;+2 safety/equipment         General transfer comment: unable to clear the bed to stand, leans heavily to right,    Balance Overall balance assessment: Needs assistance Sitting-balance support: Single extremity supported;Feet supported Sitting balance-Leahy Scale: Poor Sitting balance - Comments: constantly reaches for  foot board to support  with LUE. When  LUE not supporting, poor balnace responses, falling  backward or forward to the right. No attention to the right side/visual field.  ADL either performed or assessed with clinical judgement   ADL Overall ADL's : Needs assistance/impaired Eating/Feeding: Maximal assistance;Bed level Eating/Feeding Details (indicate cue type and reason): able to drink from a straw placed at his mouth Grooming:  Bed level;Total assistance Grooming Details (indicate cue type and reason): Placed wash cloth in patient's hand, despite verbal, visual and tactile cues Upper Body Bathing: Bed level;Maximal assistance   Lower Body Bathing: Total assistance;Bed level   Upper Body Dressing : Maximal assistance;Bed level   Lower Body Dressing: Total assistance;Bed level     Toilet Transfer Details (indicate cue type and reason): unable Toileting- Clothing Manipulation and Hygiene: Total assistance;Bed level               Vision   Vision Assessment?: Vision impaired- to be further tested in functional context Additional Comments: Patient unable to follow commands for visual assessment. Patient exhibits right neglect. Does not track or attend past visual midline.     Perception     Praxis      Pertinent Vitals/Pain Pain Assessment: Faces Faces Pain Scale: No hurt Pain Location: patient unable to speak words to indicate, did not notice any grimaces Pain Intervention(s): Monitored during session     Hand Dominance Right   Extremity/Trunk Assessment Upper Extremity Assessment Upper Extremity Assessment: RUE deficits/detail;LUE deficits/detail RUE Deficits / Details: No active movement of RUE, slight subluxation of the glenohumeral joint, all joints could be passively ranged. RUE Sensation: decreased light touch;decreased proprioception LUE Deficits / Details: WFL ROM and strength   Lower Extremity Assessment Lower Extremity Assessment: RLE deficits/detail;LLE deficits/detail RLE Deficits / Details: dense paresis, no volitional movements. ? slight increase in tone. Does not appear to have sensation as noted by deep stimuli does not ellicit any responses on the leg. LLE Deficits / Details: moves leg volitionally when mobilizing   Cervical / Trunk Assessment Cervical / Trunk Assessment: Other exceptions Cervical / Trunk Exceptions: decreased trunk control sitting upright   Communication  Communication Communication: Receptive difficulties;Expressive difficulties   Cognition Arousal/Alertness: Awake/alert Behavior During Therapy: Flat affect Overall Cognitive Status: Difficult to assess Area of Impairment: Orientation;Following commands                               General Comments: Patient awake and alert. Patient unable to articulate a single word - patient's speech non sensical and predominanty perseverated sounds.   General Comments       Exercises     Shoulder Instructions      Home Living Family/patient expects to be discharged to:: Skilled nursing facility Living Arrangements: Alone Available Help at Discharge: Skilled Nursing Facility                                    Prior Functioning/Environment Level of Independence: Independent with assistive device(s)        Comments: From PT's initial evaluatin: Pt with some difficulty answering questions, reports he uses RW around the home/community and independent with ADLs.        OT Problem List: Decreased strength;Decreased range of motion;Decreased activity tolerance;Impaired balance (sitting and/or standing);Impaired vision/perception;Decreased coordination;Decreased cognition;Decreased safety awareness;Decreased knowledge of use of DME or AE;Decreased knowledge of precautions;Impaired sensation;Impaired tone;Impaired UE functional use      OT Treatment/Interventions: Self-care/ADL training;Therapeutic exercise;Neuromuscular education;DME and/or AE instruction;Splinting;Therapeutic activities;Cognitive remediation/compensation;Visual/perceptual remediation/compensation;Patient/family education;Balance training    OT Goals(Current goals  can be found in the care plan section) Acute Rehab OT Goals OT Goal Formulation: Patient unable to participate in goal setting Time For Goal Achievement: 04/11/20 Potential to Achieve Goals: Poor  OT Frequency: Min 2X/week   Barriers to D/C:             Co-evaluation PT/OT/SLP Co-Evaluation/Treatment: Yes Reason for Co-Treatment: For patient/therapist safety;To address functional/ADL transfers PT goals addressed during session: Mobility/safety with mobility OT goals addressed during session: ADL's and self-care      AM-PAC OT "6 Clicks" Daily Activity     Outcome Measure Help from another person eating meals?: A Lot Help from another person taking care of personal grooming?: Total Help from another person toileting, which includes using toliet, bedpan, or urinal?: Total Help from another person bathing (including washing, rinsing, drying)?: A Lot Help from another person to put on and taking off regular upper body clothing?: A Lot Help from another person to put on and taking off regular lower body clothing?: Total 6 Click Score: 9   End of Session Nurse Communication: Mobility status  Activity Tolerance: Patient tolerated treatment well Patient left: in bed;with call bell/phone within reach;with bed alarm set  OT Visit Diagnosis: Other abnormalities of gait and mobility (R26.89);Hemiplegia and hemiparesis Hemiplegia - Right/Left: Right Hemiplegia - dominant/non-dominant: Dominant Hemiplegia - caused by: Cerebral infarction                Time: 1610-9604 OT Time Calculation (min): 17 min Charges:  OT General Charges $OT Visit: 1 Visit OT Evaluation $OT Eval Moderate Complexity: 1 Mod  Senica Crall, OTR/L Acute Care Rehab Services  Office [phone] Pager: [phone]   Kelli Churn 03/28/2020, 11:14 AM

## 2020-03-29 DIAGNOSIS — R627 Adult failure to thrive: Secondary | ICD-10-CM | POA: Diagnosis not present

## 2020-03-29 DIAGNOSIS — I63512 Cerebral infarction due to unspecified occlusion or stenosis of left middle cerebral artery: Secondary | ICD-10-CM | POA: Diagnosis not present

## 2020-03-29 DIAGNOSIS — R41841 Cognitive communication deficit: Secondary | ICD-10-CM | POA: Diagnosis not present

## 2020-03-29 DIAGNOSIS — I5022 Chronic systolic (congestive) heart failure: Secondary | ICD-10-CM | POA: Diagnosis not present

## 2020-03-29 DIAGNOSIS — I480 Paroxysmal atrial fibrillation: Secondary | ICD-10-CM | POA: Diagnosis not present

## 2020-03-29 DIAGNOSIS — I10 Essential (primary) hypertension: Secondary | ICD-10-CM | POA: Diagnosis not present

## 2020-03-29 DIAGNOSIS — E785 Hyperlipidemia, unspecified: Secondary | ICD-10-CM | POA: Diagnosis not present

## 2020-03-29 DIAGNOSIS — N1832 Chronic kidney disease, stage 3b: Secondary | ICD-10-CM | POA: Diagnosis not present

## 2020-04-03 DIAGNOSIS — I63512 Cerebral infarction due to unspecified occlusion or stenosis of left middle cerebral artery: Secondary | ICD-10-CM | POA: Diagnosis not present

## 2020-04-03 DIAGNOSIS — S32000A Wedge compression fracture of unspecified lumbar vertebra, initial encounter for closed fracture: Secondary | ICD-10-CM | POA: Diagnosis not present

## 2020-04-13 DIAGNOSIS — I63512 Cerebral infarction due to unspecified occlusion or stenosis of left middle cerebral artery: Secondary | ICD-10-CM | POA: Diagnosis not present

## 2020-04-13 DIAGNOSIS — I69351 Hemiplegia and hemiparesis following cerebral infarction affecting right dominant side: Secondary | ICD-10-CM | POA: Diagnosis not present

## 2020-04-13 DIAGNOSIS — I69320 Aphasia following cerebral infarction: Secondary | ICD-10-CM | POA: Diagnosis not present

## 2020-04-13 DIAGNOSIS — Z515 Encounter for palliative care: Secondary | ICD-10-CM | POA: Diagnosis not present

## 2020-04-13 DIAGNOSIS — R627 Adult failure to thrive: Secondary | ICD-10-CM | POA: Diagnosis not present

## 2020-04-27 DIAGNOSIS — I69351 Hemiplegia and hemiparesis following cerebral infarction affecting right dominant side: Secondary | ICD-10-CM | POA: Diagnosis not present

## 2020-04-27 DIAGNOSIS — I63512 Cerebral infarction due to unspecified occlusion or stenosis of left middle cerebral artery: Secondary | ICD-10-CM | POA: Diagnosis not present

## 2020-04-27 DIAGNOSIS — R1310 Dysphagia, unspecified: Secondary | ICD-10-CM | POA: Diagnosis not present

## 2020-04-27 DIAGNOSIS — I69320 Aphasia following cerebral infarction: Secondary | ICD-10-CM | POA: Diagnosis not present

## 2020-04-27 DIAGNOSIS — R627 Adult failure to thrive: Secondary | ICD-10-CM | POA: Diagnosis not present

## 2020-04-27 DIAGNOSIS — Z515 Encounter for palliative care: Secondary | ICD-10-CM | POA: Diagnosis not present

## 2020-04-30 DIAGNOSIS — R1310 Dysphagia, unspecified: Secondary | ICD-10-CM | POA: Diagnosis not present

## 2020-04-30 DIAGNOSIS — I10 Essential (primary) hypertension: Secondary | ICD-10-CM | POA: Diagnosis not present

## 2020-04-30 DIAGNOSIS — N1832 Chronic kidney disease, stage 3b: Secondary | ICD-10-CM | POA: Diagnosis not present

## 2020-04-30 DIAGNOSIS — R627 Adult failure to thrive: Secondary | ICD-10-CM | POA: Diagnosis not present

## 2020-04-30 DIAGNOSIS — I480 Paroxysmal atrial fibrillation: Secondary | ICD-10-CM | POA: Diagnosis not present

## 2020-04-30 DIAGNOSIS — I63512 Cerebral infarction due to unspecified occlusion or stenosis of left middle cerebral artery: Secondary | ICD-10-CM | POA: Diagnosis not present

## 2020-04-30 DIAGNOSIS — E785 Hyperlipidemia, unspecified: Secondary | ICD-10-CM | POA: Diagnosis not present

## 2020-04-30 DIAGNOSIS — R41841 Cognitive communication deficit: Secondary | ICD-10-CM | POA: Diagnosis not present

## 2020-05-11 DIAGNOSIS — I63512 Cerebral infarction due to unspecified occlusion or stenosis of left middle cerebral artery: Secondary | ICD-10-CM | POA: Diagnosis not present

## 2020-05-11 DIAGNOSIS — R1310 Dysphagia, unspecified: Secondary | ICD-10-CM | POA: Diagnosis not present

## 2020-05-11 DIAGNOSIS — Z515 Encounter for palliative care: Secondary | ICD-10-CM | POA: Diagnosis not present

## 2020-05-11 DIAGNOSIS — I69351 Hemiplegia and hemiparesis following cerebral infarction affecting right dominant side: Secondary | ICD-10-CM | POA: Diagnosis not present

## 2020-05-11 DIAGNOSIS — R627 Adult failure to thrive: Secondary | ICD-10-CM | POA: Diagnosis not present

## 2020-05-11 DIAGNOSIS — I69320 Aphasia following cerebral infarction: Secondary | ICD-10-CM | POA: Diagnosis not present

## 2020-05-14 DIAGNOSIS — Z79899 Other long term (current) drug therapy: Secondary | ICD-10-CM | POA: Diagnosis not present

## 2020-05-14 DIAGNOSIS — S32000A Wedge compression fracture of unspecified lumbar vertebra, initial encounter for closed fracture: Secondary | ICD-10-CM | POA: Diagnosis not present

## 2020-05-14 DIAGNOSIS — R52 Pain, unspecified: Secondary | ICD-10-CM | POA: Diagnosis not present

## 2020-05-15 DIAGNOSIS — I63512 Cerebral infarction due to unspecified occlusion or stenosis of left middle cerebral artery: Secondary | ICD-10-CM | POA: Diagnosis not present

## 2020-05-15 DIAGNOSIS — R41841 Cognitive communication deficit: Secondary | ICD-10-CM | POA: Diagnosis not present

## 2020-05-15 DIAGNOSIS — I10 Essential (primary) hypertension: Secondary | ICD-10-CM | POA: Diagnosis not present

## 2020-05-15 DIAGNOSIS — M6281 Muscle weakness (generalized): Secondary | ICD-10-CM | POA: Diagnosis not present

## 2020-05-15 DIAGNOSIS — R1310 Dysphagia, unspecified: Secondary | ICD-10-CM | POA: Diagnosis not present

## 2020-05-16 DIAGNOSIS — I63512 Cerebral infarction due to unspecified occlusion or stenosis of left middle cerebral artery: Secondary | ICD-10-CM | POA: Diagnosis not present

## 2020-05-16 DIAGNOSIS — M6281 Muscle weakness (generalized): Secondary | ICD-10-CM | POA: Diagnosis not present

## 2020-05-16 DIAGNOSIS — I10 Essential (primary) hypertension: Secondary | ICD-10-CM | POA: Diagnosis not present

## 2020-05-16 DIAGNOSIS — R41841 Cognitive communication deficit: Secondary | ICD-10-CM | POA: Diagnosis not present

## 2020-05-16 DIAGNOSIS — R1310 Dysphagia, unspecified: Secondary | ICD-10-CM | POA: Diagnosis not present

## 2020-05-17 DIAGNOSIS — R41841 Cognitive communication deficit: Secondary | ICD-10-CM | POA: Diagnosis not present

## 2020-05-17 DIAGNOSIS — I63512 Cerebral infarction due to unspecified occlusion or stenosis of left middle cerebral artery: Secondary | ICD-10-CM | POA: Diagnosis not present

## 2020-05-17 DIAGNOSIS — M6281 Muscle weakness (generalized): Secondary | ICD-10-CM | POA: Diagnosis not present

## 2020-05-17 DIAGNOSIS — I10 Essential (primary) hypertension: Secondary | ICD-10-CM | POA: Diagnosis not present

## 2020-05-17 DIAGNOSIS — R1310 Dysphagia, unspecified: Secondary | ICD-10-CM | POA: Diagnosis not present

## 2020-05-18 DIAGNOSIS — R41841 Cognitive communication deficit: Secondary | ICD-10-CM | POA: Diagnosis not present

## 2020-05-18 DIAGNOSIS — M6281 Muscle weakness (generalized): Secondary | ICD-10-CM | POA: Diagnosis not present

## 2020-05-18 DIAGNOSIS — I10 Essential (primary) hypertension: Secondary | ICD-10-CM | POA: Diagnosis not present

## 2020-05-18 DIAGNOSIS — I63512 Cerebral infarction due to unspecified occlusion or stenosis of left middle cerebral artery: Secondary | ICD-10-CM | POA: Diagnosis not present

## 2020-05-18 DIAGNOSIS — R1310 Dysphagia, unspecified: Secondary | ICD-10-CM | POA: Diagnosis not present

## 2020-05-21 DIAGNOSIS — R52 Pain, unspecified: Secondary | ICD-10-CM | POA: Diagnosis not present

## 2020-05-21 DIAGNOSIS — I63512 Cerebral infarction due to unspecified occlusion or stenosis of left middle cerebral artery: Secondary | ICD-10-CM | POA: Diagnosis not present

## 2020-05-21 DIAGNOSIS — R69 Illness, unspecified: Secondary | ICD-10-CM | POA: Diagnosis not present

## 2020-05-21 DIAGNOSIS — I10 Essential (primary) hypertension: Secondary | ICD-10-CM | POA: Diagnosis not present

## 2020-05-22 DIAGNOSIS — R41841 Cognitive communication deficit: Secondary | ICD-10-CM | POA: Diagnosis not present

## 2020-05-22 DIAGNOSIS — Z23 Encounter for immunization: Secondary | ICD-10-CM | POA: Diagnosis not present

## 2020-05-22 DIAGNOSIS — I10 Essential (primary) hypertension: Secondary | ICD-10-CM | POA: Diagnosis not present

## 2020-05-22 DIAGNOSIS — I63512 Cerebral infarction due to unspecified occlusion or stenosis of left middle cerebral artery: Secondary | ICD-10-CM | POA: Diagnosis not present

## 2020-05-22 DIAGNOSIS — M6281 Muscle weakness (generalized): Secondary | ICD-10-CM | POA: Diagnosis not present

## 2020-05-22 DIAGNOSIS — R1310 Dysphagia, unspecified: Secondary | ICD-10-CM | POA: Diagnosis not present

## 2020-05-23 DIAGNOSIS — R1310 Dysphagia, unspecified: Secondary | ICD-10-CM | POA: Diagnosis not present

## 2020-05-23 DIAGNOSIS — I10 Essential (primary) hypertension: Secondary | ICD-10-CM | POA: Diagnosis not present

## 2020-05-23 DIAGNOSIS — R41841 Cognitive communication deficit: Secondary | ICD-10-CM | POA: Diagnosis not present

## 2020-05-23 DIAGNOSIS — M6281 Muscle weakness (generalized): Secondary | ICD-10-CM | POA: Diagnosis not present

## 2020-05-23 DIAGNOSIS — Z515 Encounter for palliative care: Secondary | ICD-10-CM | POA: Diagnosis not present

## 2020-05-23 DIAGNOSIS — R627 Adult failure to thrive: Secondary | ICD-10-CM | POA: Diagnosis not present

## 2020-05-23 DIAGNOSIS — I69320 Aphasia following cerebral infarction: Secondary | ICD-10-CM | POA: Diagnosis not present

## 2020-05-23 DIAGNOSIS — I63512 Cerebral infarction due to unspecified occlusion or stenosis of left middle cerebral artery: Secondary | ICD-10-CM | POA: Diagnosis not present

## 2020-05-23 DIAGNOSIS — I69351 Hemiplegia and hemiparesis following cerebral infarction affecting right dominant side: Secondary | ICD-10-CM | POA: Diagnosis not present

## 2020-05-28 DIAGNOSIS — I10 Essential (primary) hypertension: Secondary | ICD-10-CM | POA: Diagnosis not present

## 2020-05-28 DIAGNOSIS — M6281 Muscle weakness (generalized): Secondary | ICD-10-CM | POA: Diagnosis not present

## 2020-05-28 DIAGNOSIS — I63512 Cerebral infarction due to unspecified occlusion or stenosis of left middle cerebral artery: Secondary | ICD-10-CM | POA: Diagnosis not present

## 2020-05-28 DIAGNOSIS — R41841 Cognitive communication deficit: Secondary | ICD-10-CM | POA: Diagnosis not present

## 2020-05-28 DIAGNOSIS — R1310 Dysphagia, unspecified: Secondary | ICD-10-CM | POA: Diagnosis not present

## 2020-05-29 DIAGNOSIS — I63512 Cerebral infarction due to unspecified occlusion or stenosis of left middle cerebral artery: Secondary | ICD-10-CM | POA: Diagnosis not present

## 2020-05-29 DIAGNOSIS — M6281 Muscle weakness (generalized): Secondary | ICD-10-CM | POA: Diagnosis not present

## 2020-05-29 DIAGNOSIS — R41841 Cognitive communication deficit: Secondary | ICD-10-CM | POA: Diagnosis not present

## 2020-05-29 DIAGNOSIS — I679 Cerebrovascular disease, unspecified: Secondary | ICD-10-CM | POA: Diagnosis not present

## 2020-05-29 DIAGNOSIS — I10 Essential (primary) hypertension: Secondary | ICD-10-CM | POA: Diagnosis not present

## 2020-05-29 DIAGNOSIS — E785 Hyperlipidemia, unspecified: Secondary | ICD-10-CM | POA: Diagnosis not present

## 2020-05-29 DIAGNOSIS — R1310 Dysphagia, unspecified: Secondary | ICD-10-CM | POA: Diagnosis not present

## 2020-05-30 DIAGNOSIS — R1310 Dysphagia, unspecified: Secondary | ICD-10-CM | POA: Diagnosis not present

## 2020-05-30 DIAGNOSIS — I10 Essential (primary) hypertension: Secondary | ICD-10-CM | POA: Diagnosis not present

## 2020-05-30 DIAGNOSIS — R41841 Cognitive communication deficit: Secondary | ICD-10-CM | POA: Diagnosis not present

## 2020-05-30 DIAGNOSIS — M6281 Muscle weakness (generalized): Secondary | ICD-10-CM | POA: Diagnosis not present

## 2020-05-30 DIAGNOSIS — I63512 Cerebral infarction due to unspecified occlusion or stenosis of left middle cerebral artery: Secondary | ICD-10-CM | POA: Diagnosis not present

## 2020-05-31 DIAGNOSIS — R1310 Dysphagia, unspecified: Secondary | ICD-10-CM | POA: Diagnosis not present

## 2020-05-31 DIAGNOSIS — R41841 Cognitive communication deficit: Secondary | ICD-10-CM | POA: Diagnosis not present

## 2020-05-31 DIAGNOSIS — I63512 Cerebral infarction due to unspecified occlusion or stenosis of left middle cerebral artery: Secondary | ICD-10-CM | POA: Diagnosis not present

## 2020-05-31 DIAGNOSIS — I10 Essential (primary) hypertension: Secondary | ICD-10-CM | POA: Diagnosis not present

## 2020-05-31 DIAGNOSIS — M6281 Muscle weakness (generalized): Secondary | ICD-10-CM | POA: Diagnosis not present

## 2020-06-01 DIAGNOSIS — I10 Essential (primary) hypertension: Secondary | ICD-10-CM | POA: Diagnosis not present

## 2020-06-01 DIAGNOSIS — M6281 Muscle weakness (generalized): Secondary | ICD-10-CM | POA: Diagnosis not present

## 2020-06-01 DIAGNOSIS — R1310 Dysphagia, unspecified: Secondary | ICD-10-CM | POA: Diagnosis not present

## 2020-06-01 DIAGNOSIS — R41841 Cognitive communication deficit: Secondary | ICD-10-CM | POA: Diagnosis not present

## 2020-06-01 DIAGNOSIS — I63512 Cerebral infarction due to unspecified occlusion or stenosis of left middle cerebral artery: Secondary | ICD-10-CM | POA: Diagnosis not present

## 2020-06-02 DIAGNOSIS — R41841 Cognitive communication deficit: Secondary | ICD-10-CM | POA: Diagnosis not present

## 2020-06-02 DIAGNOSIS — I10 Essential (primary) hypertension: Secondary | ICD-10-CM | POA: Diagnosis not present

## 2020-06-02 DIAGNOSIS — R1310 Dysphagia, unspecified: Secondary | ICD-10-CM | POA: Diagnosis not present

## 2020-06-02 DIAGNOSIS — I63512 Cerebral infarction due to unspecified occlusion or stenosis of left middle cerebral artery: Secondary | ICD-10-CM | POA: Diagnosis not present

## 2020-06-02 DIAGNOSIS — M6281 Muscle weakness (generalized): Secondary | ICD-10-CM | POA: Diagnosis not present

## 2020-06-03 DIAGNOSIS — I63512 Cerebral infarction due to unspecified occlusion or stenosis of left middle cerebral artery: Secondary | ICD-10-CM | POA: Diagnosis not present

## 2020-06-03 DIAGNOSIS — R1310 Dysphagia, unspecified: Secondary | ICD-10-CM | POA: Diagnosis not present

## 2020-06-03 DIAGNOSIS — R41841 Cognitive communication deficit: Secondary | ICD-10-CM | POA: Diagnosis not present

## 2020-06-03 DIAGNOSIS — I10 Essential (primary) hypertension: Secondary | ICD-10-CM | POA: Diagnosis not present

## 2020-06-03 DIAGNOSIS — M6281 Muscle weakness (generalized): Secondary | ICD-10-CM | POA: Diagnosis not present

## 2020-06-04 DIAGNOSIS — R1310 Dysphagia, unspecified: Secondary | ICD-10-CM | POA: Diagnosis not present

## 2020-06-04 DIAGNOSIS — I10 Essential (primary) hypertension: Secondary | ICD-10-CM | POA: Diagnosis not present

## 2020-06-04 DIAGNOSIS — I63512 Cerebral infarction due to unspecified occlusion or stenosis of left middle cerebral artery: Secondary | ICD-10-CM | POA: Diagnosis not present

## 2020-06-04 DIAGNOSIS — M6281 Muscle weakness (generalized): Secondary | ICD-10-CM | POA: Diagnosis not present

## 2020-06-04 DIAGNOSIS — R41841 Cognitive communication deficit: Secondary | ICD-10-CM | POA: Diagnosis not present

## 2020-06-05 DIAGNOSIS — M6281 Muscle weakness (generalized): Secondary | ICD-10-CM | POA: Diagnosis not present

## 2020-06-05 DIAGNOSIS — R41841 Cognitive communication deficit: Secondary | ICD-10-CM | POA: Diagnosis not present

## 2020-06-05 DIAGNOSIS — R1310 Dysphagia, unspecified: Secondary | ICD-10-CM | POA: Diagnosis not present

## 2020-06-05 DIAGNOSIS — I63512 Cerebral infarction due to unspecified occlusion or stenosis of left middle cerebral artery: Secondary | ICD-10-CM | POA: Diagnosis not present

## 2020-06-05 DIAGNOSIS — I10 Essential (primary) hypertension: Secondary | ICD-10-CM | POA: Diagnosis not present

## 2020-06-06 DIAGNOSIS — M6281 Muscle weakness (generalized): Secondary | ICD-10-CM | POA: Diagnosis not present

## 2020-06-06 DIAGNOSIS — I10 Essential (primary) hypertension: Secondary | ICD-10-CM | POA: Diagnosis not present

## 2020-06-06 DIAGNOSIS — R1310 Dysphagia, unspecified: Secondary | ICD-10-CM | POA: Diagnosis not present

## 2020-06-06 DIAGNOSIS — R41841 Cognitive communication deficit: Secondary | ICD-10-CM | POA: Diagnosis not present

## 2020-06-06 DIAGNOSIS — I63512 Cerebral infarction due to unspecified occlusion or stenosis of left middle cerebral artery: Secondary | ICD-10-CM | POA: Diagnosis not present

## 2020-06-07 DIAGNOSIS — R1310 Dysphagia, unspecified: Secondary | ICD-10-CM | POA: Diagnosis not present

## 2020-06-07 DIAGNOSIS — I10 Essential (primary) hypertension: Secondary | ICD-10-CM | POA: Diagnosis not present

## 2020-06-07 DIAGNOSIS — I63512 Cerebral infarction due to unspecified occlusion or stenosis of left middle cerebral artery: Secondary | ICD-10-CM | POA: Diagnosis not present

## 2020-06-07 DIAGNOSIS — M6281 Muscle weakness (generalized): Secondary | ICD-10-CM | POA: Diagnosis not present

## 2020-06-07 DIAGNOSIS — R41841 Cognitive communication deficit: Secondary | ICD-10-CM | POA: Diagnosis not present

## 2020-06-08 DIAGNOSIS — M6281 Muscle weakness (generalized): Secondary | ICD-10-CM | POA: Diagnosis not present

## 2020-06-08 DIAGNOSIS — I63512 Cerebral infarction due to unspecified occlusion or stenosis of left middle cerebral artery: Secondary | ICD-10-CM | POA: Diagnosis not present

## 2020-06-08 DIAGNOSIS — R1310 Dysphagia, unspecified: Secondary | ICD-10-CM | POA: Diagnosis not present

## 2020-06-08 DIAGNOSIS — I10 Essential (primary) hypertension: Secondary | ICD-10-CM | POA: Diagnosis not present

## 2020-06-08 DIAGNOSIS — R41841 Cognitive communication deficit: Secondary | ICD-10-CM | POA: Diagnosis not present

## 2020-06-11 DIAGNOSIS — I63512 Cerebral infarction due to unspecified occlusion or stenosis of left middle cerebral artery: Secondary | ICD-10-CM | POA: Diagnosis not present

## 2020-06-11 DIAGNOSIS — R41841 Cognitive communication deficit: Secondary | ICD-10-CM | POA: Diagnosis not present

## 2020-06-11 DIAGNOSIS — I10 Essential (primary) hypertension: Secondary | ICD-10-CM | POA: Diagnosis not present

## 2020-06-11 DIAGNOSIS — M6281 Muscle weakness (generalized): Secondary | ICD-10-CM | POA: Diagnosis not present

## 2020-06-11 DIAGNOSIS — R1310 Dysphagia, unspecified: Secondary | ICD-10-CM | POA: Diagnosis not present

## 2020-06-12 DIAGNOSIS — I63512 Cerebral infarction due to unspecified occlusion or stenosis of left middle cerebral artery: Secondary | ICD-10-CM | POA: Diagnosis not present

## 2020-06-12 DIAGNOSIS — R41841 Cognitive communication deficit: Secondary | ICD-10-CM | POA: Diagnosis not present

## 2020-06-12 DIAGNOSIS — R1310 Dysphagia, unspecified: Secondary | ICD-10-CM | POA: Diagnosis not present

## 2020-06-12 DIAGNOSIS — M6281 Muscle weakness (generalized): Secondary | ICD-10-CM | POA: Diagnosis not present

## 2020-06-12 DIAGNOSIS — I10 Essential (primary) hypertension: Secondary | ICD-10-CM | POA: Diagnosis not present

## 2020-06-14 DIAGNOSIS — I63512 Cerebral infarction due to unspecified occlusion or stenosis of left middle cerebral artery: Secondary | ICD-10-CM | POA: Diagnosis not present

## 2020-06-14 DIAGNOSIS — I10 Essential (primary) hypertension: Secondary | ICD-10-CM | POA: Diagnosis not present

## 2020-06-14 DIAGNOSIS — M6281 Muscle weakness (generalized): Secondary | ICD-10-CM | POA: Diagnosis not present

## 2020-06-14 DIAGNOSIS — R1310 Dysphagia, unspecified: Secondary | ICD-10-CM | POA: Diagnosis not present

## 2020-06-14 DIAGNOSIS — R41841 Cognitive communication deficit: Secondary | ICD-10-CM | POA: Diagnosis not present

## 2020-06-15 DIAGNOSIS — I10 Essential (primary) hypertension: Secondary | ICD-10-CM | POA: Diagnosis not present

## 2020-06-15 DIAGNOSIS — I63512 Cerebral infarction due to unspecified occlusion or stenosis of left middle cerebral artery: Secondary | ICD-10-CM | POA: Diagnosis not present

## 2020-06-15 DIAGNOSIS — M6281 Muscle weakness (generalized): Secondary | ICD-10-CM | POA: Diagnosis not present

## 2020-06-15 DIAGNOSIS — R1310 Dysphagia, unspecified: Secondary | ICD-10-CM | POA: Diagnosis not present

## 2020-06-15 DIAGNOSIS — R41841 Cognitive communication deficit: Secondary | ICD-10-CM | POA: Diagnosis not present

## 2020-06-16 DIAGNOSIS — R1310 Dysphagia, unspecified: Secondary | ICD-10-CM | POA: Diagnosis not present

## 2020-06-16 DIAGNOSIS — I63512 Cerebral infarction due to unspecified occlusion or stenosis of left middle cerebral artery: Secondary | ICD-10-CM | POA: Diagnosis not present

## 2020-06-16 DIAGNOSIS — I10 Essential (primary) hypertension: Secondary | ICD-10-CM | POA: Diagnosis not present

## 2020-06-16 DIAGNOSIS — M6281 Muscle weakness (generalized): Secondary | ICD-10-CM | POA: Diagnosis not present

## 2020-06-16 DIAGNOSIS — R41841 Cognitive communication deficit: Secondary | ICD-10-CM | POA: Diagnosis not present

## 2020-06-18 DIAGNOSIS — R627 Adult failure to thrive: Secondary | ICD-10-CM | POA: Diagnosis not present

## 2020-06-18 DIAGNOSIS — I10 Essential (primary) hypertension: Secondary | ICD-10-CM | POA: Diagnosis not present

## 2020-06-18 DIAGNOSIS — Z515 Encounter for palliative care: Secondary | ICD-10-CM | POA: Diagnosis not present

## 2020-06-18 DIAGNOSIS — I69351 Hemiplegia and hemiparesis following cerebral infarction affecting right dominant side: Secondary | ICD-10-CM | POA: Diagnosis not present

## 2020-06-18 DIAGNOSIS — M6281 Muscle weakness (generalized): Secondary | ICD-10-CM | POA: Diagnosis not present

## 2020-06-18 DIAGNOSIS — R1310 Dysphagia, unspecified: Secondary | ICD-10-CM | POA: Diagnosis not present

## 2020-06-18 DIAGNOSIS — R41841 Cognitive communication deficit: Secondary | ICD-10-CM | POA: Diagnosis not present

## 2020-06-18 DIAGNOSIS — I63512 Cerebral infarction due to unspecified occlusion or stenosis of left middle cerebral artery: Secondary | ICD-10-CM | POA: Diagnosis not present

## 2020-06-18 DIAGNOSIS — I69320 Aphasia following cerebral infarction: Secondary | ICD-10-CM | POA: Diagnosis not present

## 2020-06-19 DIAGNOSIS — M6281 Muscle weakness (generalized): Secondary | ICD-10-CM | POA: Diagnosis not present

## 2020-06-19 DIAGNOSIS — I63512 Cerebral infarction due to unspecified occlusion or stenosis of left middle cerebral artery: Secondary | ICD-10-CM | POA: Diagnosis not present

## 2020-06-19 DIAGNOSIS — I10 Essential (primary) hypertension: Secondary | ICD-10-CM | POA: Diagnosis not present

## 2020-06-19 DIAGNOSIS — R41841 Cognitive communication deficit: Secondary | ICD-10-CM | POA: Diagnosis not present

## 2020-06-19 DIAGNOSIS — R1310 Dysphagia, unspecified: Secondary | ICD-10-CM | POA: Diagnosis not present

## 2020-06-20 DIAGNOSIS — R1310 Dysphagia, unspecified: Secondary | ICD-10-CM | POA: Diagnosis not present

## 2020-06-20 DIAGNOSIS — R41841 Cognitive communication deficit: Secondary | ICD-10-CM | POA: Diagnosis not present

## 2020-06-20 DIAGNOSIS — I10 Essential (primary) hypertension: Secondary | ICD-10-CM | POA: Diagnosis not present

## 2020-06-20 DIAGNOSIS — M6281 Muscle weakness (generalized): Secondary | ICD-10-CM | POA: Diagnosis not present

## 2020-06-20 DIAGNOSIS — I63512 Cerebral infarction due to unspecified occlusion or stenosis of left middle cerebral artery: Secondary | ICD-10-CM | POA: Diagnosis not present

## 2020-06-21 DIAGNOSIS — R69 Illness, unspecified: Secondary | ICD-10-CM | POA: Diagnosis not present

## 2020-06-21 DIAGNOSIS — I10 Essential (primary) hypertension: Secondary | ICD-10-CM | POA: Diagnosis not present

## 2020-06-21 DIAGNOSIS — R41841 Cognitive communication deficit: Secondary | ICD-10-CM | POA: Diagnosis not present

## 2020-06-21 DIAGNOSIS — Z79899 Other long term (current) drug therapy: Secondary | ICD-10-CM | POA: Diagnosis not present

## 2020-06-21 DIAGNOSIS — R52 Pain, unspecified: Secondary | ICD-10-CM | POA: Diagnosis not present

## 2020-06-21 DIAGNOSIS — R1310 Dysphagia, unspecified: Secondary | ICD-10-CM | POA: Diagnosis not present

## 2020-06-21 DIAGNOSIS — S32000A Wedge compression fracture of unspecified lumbar vertebra, initial encounter for closed fracture: Secondary | ICD-10-CM | POA: Diagnosis not present

## 2020-06-21 DIAGNOSIS — I63512 Cerebral infarction due to unspecified occlusion or stenosis of left middle cerebral artery: Secondary | ICD-10-CM | POA: Diagnosis not present

## 2020-06-21 DIAGNOSIS — M6281 Muscle weakness (generalized): Secondary | ICD-10-CM | POA: Diagnosis not present

## 2020-06-22 DIAGNOSIS — R1310 Dysphagia, unspecified: Secondary | ICD-10-CM | POA: Diagnosis not present

## 2020-06-22 DIAGNOSIS — I10 Essential (primary) hypertension: Secondary | ICD-10-CM | POA: Diagnosis not present

## 2020-06-22 DIAGNOSIS — I63512 Cerebral infarction due to unspecified occlusion or stenosis of left middle cerebral artery: Secondary | ICD-10-CM | POA: Diagnosis not present

## 2020-06-22 DIAGNOSIS — R41841 Cognitive communication deficit: Secondary | ICD-10-CM | POA: Diagnosis not present

## 2020-06-22 DIAGNOSIS — M6281 Muscle weakness (generalized): Secondary | ICD-10-CM | POA: Diagnosis not present

## 2020-06-25 DIAGNOSIS — M6281 Muscle weakness (generalized): Secondary | ICD-10-CM | POA: Diagnosis not present

## 2020-06-25 DIAGNOSIS — R41841 Cognitive communication deficit: Secondary | ICD-10-CM | POA: Diagnosis not present

## 2020-06-25 DIAGNOSIS — R1310 Dysphagia, unspecified: Secondary | ICD-10-CM | POA: Diagnosis not present

## 2020-06-25 DIAGNOSIS — I480 Paroxysmal atrial fibrillation: Secondary | ICD-10-CM | POA: Diagnosis not present

## 2020-06-25 DIAGNOSIS — N1832 Chronic kidney disease, stage 3b: Secondary | ICD-10-CM | POA: Diagnosis not present

## 2020-06-25 DIAGNOSIS — I5022 Chronic systolic (congestive) heart failure: Secondary | ICD-10-CM | POA: Diagnosis not present

## 2020-06-25 DIAGNOSIS — I679 Cerebrovascular disease, unspecified: Secondary | ICD-10-CM | POA: Diagnosis not present

## 2020-06-25 DIAGNOSIS — I10 Essential (primary) hypertension: Secondary | ICD-10-CM | POA: Diagnosis not present

## 2020-06-25 DIAGNOSIS — I63512 Cerebral infarction due to unspecified occlusion or stenosis of left middle cerebral artery: Secondary | ICD-10-CM | POA: Diagnosis not present

## 2020-06-26 DIAGNOSIS — I63512 Cerebral infarction due to unspecified occlusion or stenosis of left middle cerebral artery: Secondary | ICD-10-CM | POA: Diagnosis not present

## 2020-06-26 DIAGNOSIS — R41841 Cognitive communication deficit: Secondary | ICD-10-CM | POA: Diagnosis not present

## 2020-06-26 DIAGNOSIS — I10 Essential (primary) hypertension: Secondary | ICD-10-CM | POA: Diagnosis not present

## 2020-06-26 DIAGNOSIS — R1310 Dysphagia, unspecified: Secondary | ICD-10-CM | POA: Diagnosis not present

## 2020-06-26 DIAGNOSIS — M6281 Muscle weakness (generalized): Secondary | ICD-10-CM | POA: Diagnosis not present

## 2020-06-27 DIAGNOSIS — M6281 Muscle weakness (generalized): Secondary | ICD-10-CM | POA: Diagnosis not present

## 2020-06-27 DIAGNOSIS — R41841 Cognitive communication deficit: Secondary | ICD-10-CM | POA: Diagnosis not present

## 2020-06-27 DIAGNOSIS — I10 Essential (primary) hypertension: Secondary | ICD-10-CM | POA: Diagnosis not present

## 2020-06-27 DIAGNOSIS — I63512 Cerebral infarction due to unspecified occlusion or stenosis of left middle cerebral artery: Secondary | ICD-10-CM | POA: Diagnosis not present

## 2020-06-27 DIAGNOSIS — R1310 Dysphagia, unspecified: Secondary | ICD-10-CM | POA: Diagnosis not present

## 2020-06-28 DIAGNOSIS — I63512 Cerebral infarction due to unspecified occlusion or stenosis of left middle cerebral artery: Secondary | ICD-10-CM | POA: Diagnosis not present

## 2020-06-28 DIAGNOSIS — R41841 Cognitive communication deficit: Secondary | ICD-10-CM | POA: Diagnosis not present

## 2020-06-28 DIAGNOSIS — M6281 Muscle weakness (generalized): Secondary | ICD-10-CM | POA: Diagnosis not present

## 2020-06-28 DIAGNOSIS — I10 Essential (primary) hypertension: Secondary | ICD-10-CM | POA: Diagnosis not present

## 2020-06-28 DIAGNOSIS — R1310 Dysphagia, unspecified: Secondary | ICD-10-CM | POA: Diagnosis not present

## 2020-06-29 DIAGNOSIS — I10 Essential (primary) hypertension: Secondary | ICD-10-CM | POA: Diagnosis not present

## 2020-06-29 DIAGNOSIS — I63512 Cerebral infarction due to unspecified occlusion or stenosis of left middle cerebral artery: Secondary | ICD-10-CM | POA: Diagnosis not present

## 2020-06-29 DIAGNOSIS — M6281 Muscle weakness (generalized): Secondary | ICD-10-CM | POA: Diagnosis not present

## 2020-06-29 DIAGNOSIS — R41841 Cognitive communication deficit: Secondary | ICD-10-CM | POA: Diagnosis not present

## 2020-06-29 DIAGNOSIS — R1310 Dysphagia, unspecified: Secondary | ICD-10-CM | POA: Diagnosis not present

## 2020-06-30 DIAGNOSIS — I63512 Cerebral infarction due to unspecified occlusion or stenosis of left middle cerebral artery: Secondary | ICD-10-CM | POA: Diagnosis not present

## 2020-06-30 DIAGNOSIS — R41841 Cognitive communication deficit: Secondary | ICD-10-CM | POA: Diagnosis not present

## 2020-06-30 DIAGNOSIS — M6281 Muscle weakness (generalized): Secondary | ICD-10-CM | POA: Diagnosis not present

## 2020-06-30 DIAGNOSIS — R1310 Dysphagia, unspecified: Secondary | ICD-10-CM | POA: Diagnosis not present

## 2020-06-30 DIAGNOSIS — I10 Essential (primary) hypertension: Secondary | ICD-10-CM | POA: Diagnosis not present

## 2020-07-02 DIAGNOSIS — M6281 Muscle weakness (generalized): Secondary | ICD-10-CM | POA: Diagnosis not present

## 2020-07-02 DIAGNOSIS — M24521 Contracture, right elbow: Secondary | ICD-10-CM | POA: Diagnosis not present

## 2020-07-02 DIAGNOSIS — R41841 Cognitive communication deficit: Secondary | ICD-10-CM | POA: Diagnosis not present

## 2020-07-02 DIAGNOSIS — I63512 Cerebral infarction due to unspecified occlusion or stenosis of left middle cerebral artery: Secondary | ICD-10-CM | POA: Diagnosis not present

## 2020-07-02 DIAGNOSIS — I10 Essential (primary) hypertension: Secondary | ICD-10-CM | POA: Diagnosis not present

## 2020-07-02 DIAGNOSIS — R1310 Dysphagia, unspecified: Secondary | ICD-10-CM | POA: Diagnosis not present

## 2020-07-03 DIAGNOSIS — I63512 Cerebral infarction due to unspecified occlusion or stenosis of left middle cerebral artery: Secondary | ICD-10-CM | POA: Diagnosis not present

## 2020-07-03 DIAGNOSIS — M6281 Muscle weakness (generalized): Secondary | ICD-10-CM | POA: Diagnosis not present

## 2020-07-03 DIAGNOSIS — I10 Essential (primary) hypertension: Secondary | ICD-10-CM | POA: Diagnosis not present

## 2020-07-03 DIAGNOSIS — R1310 Dysphagia, unspecified: Secondary | ICD-10-CM | POA: Diagnosis not present

## 2020-07-03 DIAGNOSIS — R41841 Cognitive communication deficit: Secondary | ICD-10-CM | POA: Diagnosis not present

## 2020-07-04 DIAGNOSIS — M6281 Muscle weakness (generalized): Secondary | ICD-10-CM | POA: Diagnosis not present

## 2020-07-04 DIAGNOSIS — I63512 Cerebral infarction due to unspecified occlusion or stenosis of left middle cerebral artery: Secondary | ICD-10-CM | POA: Diagnosis not present

## 2020-07-04 DIAGNOSIS — I10 Essential (primary) hypertension: Secondary | ICD-10-CM | POA: Diagnosis not present

## 2020-07-04 DIAGNOSIS — R41841 Cognitive communication deficit: Secondary | ICD-10-CM | POA: Diagnosis not present

## 2020-07-04 DIAGNOSIS — R1310 Dysphagia, unspecified: Secondary | ICD-10-CM | POA: Diagnosis not present

## 2020-07-05 DIAGNOSIS — M6281 Muscle weakness (generalized): Secondary | ICD-10-CM | POA: Diagnosis not present

## 2020-07-05 DIAGNOSIS — I10 Essential (primary) hypertension: Secondary | ICD-10-CM | POA: Diagnosis not present

## 2020-07-05 DIAGNOSIS — I63512 Cerebral infarction due to unspecified occlusion or stenosis of left middle cerebral artery: Secondary | ICD-10-CM | POA: Diagnosis not present

## 2020-07-05 DIAGNOSIS — R41841 Cognitive communication deficit: Secondary | ICD-10-CM | POA: Diagnosis not present

## 2020-07-05 DIAGNOSIS — R1310 Dysphagia, unspecified: Secondary | ICD-10-CM | POA: Diagnosis not present

## 2020-07-06 DIAGNOSIS — I63512 Cerebral infarction due to unspecified occlusion or stenosis of left middle cerebral artery: Secondary | ICD-10-CM | POA: Diagnosis not present

## 2020-07-06 DIAGNOSIS — M6281 Muscle weakness (generalized): Secondary | ICD-10-CM | POA: Diagnosis not present

## 2020-07-06 DIAGNOSIS — R41841 Cognitive communication deficit: Secondary | ICD-10-CM | POA: Diagnosis not present

## 2020-07-06 DIAGNOSIS — I10 Essential (primary) hypertension: Secondary | ICD-10-CM | POA: Diagnosis not present

## 2020-07-06 DIAGNOSIS — R1310 Dysphagia, unspecified: Secondary | ICD-10-CM | POA: Diagnosis not present

## 2020-07-08 DIAGNOSIS — M6281 Muscle weakness (generalized): Secondary | ICD-10-CM | POA: Diagnosis not present

## 2020-07-08 DIAGNOSIS — R41841 Cognitive communication deficit: Secondary | ICD-10-CM | POA: Diagnosis not present

## 2020-07-08 DIAGNOSIS — I10 Essential (primary) hypertension: Secondary | ICD-10-CM | POA: Diagnosis not present

## 2020-07-08 DIAGNOSIS — I63512 Cerebral infarction due to unspecified occlusion or stenosis of left middle cerebral artery: Secondary | ICD-10-CM | POA: Diagnosis not present

## 2020-07-08 DIAGNOSIS — R1310 Dysphagia, unspecified: Secondary | ICD-10-CM | POA: Diagnosis not present

## 2020-07-09 DIAGNOSIS — I10 Essential (primary) hypertension: Secondary | ICD-10-CM | POA: Diagnosis not present

## 2020-07-09 DIAGNOSIS — I63512 Cerebral infarction due to unspecified occlusion or stenosis of left middle cerebral artery: Secondary | ICD-10-CM | POA: Diagnosis not present

## 2020-07-09 DIAGNOSIS — R41841 Cognitive communication deficit: Secondary | ICD-10-CM | POA: Diagnosis not present

## 2020-07-09 DIAGNOSIS — R1310 Dysphagia, unspecified: Secondary | ICD-10-CM | POA: Diagnosis not present

## 2020-07-09 DIAGNOSIS — M6281 Muscle weakness (generalized): Secondary | ICD-10-CM | POA: Diagnosis not present

## 2020-07-10 DIAGNOSIS — R1310 Dysphagia, unspecified: Secondary | ICD-10-CM | POA: Diagnosis not present

## 2020-07-10 DIAGNOSIS — M6281 Muscle weakness (generalized): Secondary | ICD-10-CM | POA: Diagnosis not present

## 2020-07-10 DIAGNOSIS — I10 Essential (primary) hypertension: Secondary | ICD-10-CM | POA: Diagnosis not present

## 2020-07-10 DIAGNOSIS — R41841 Cognitive communication deficit: Secondary | ICD-10-CM | POA: Diagnosis not present

## 2020-07-10 DIAGNOSIS — I63512 Cerebral infarction due to unspecified occlusion or stenosis of left middle cerebral artery: Secondary | ICD-10-CM | POA: Diagnosis not present

## 2020-07-11 DIAGNOSIS — M6281 Muscle weakness (generalized): Secondary | ICD-10-CM | POA: Diagnosis not present

## 2020-07-11 DIAGNOSIS — I63512 Cerebral infarction due to unspecified occlusion or stenosis of left middle cerebral artery: Secondary | ICD-10-CM | POA: Diagnosis not present

## 2020-07-11 DIAGNOSIS — I10 Essential (primary) hypertension: Secondary | ICD-10-CM | POA: Diagnosis not present

## 2020-07-11 DIAGNOSIS — R1310 Dysphagia, unspecified: Secondary | ICD-10-CM | POA: Diagnosis not present

## 2020-07-11 DIAGNOSIS — R41841 Cognitive communication deficit: Secondary | ICD-10-CM | POA: Diagnosis not present

## 2020-07-12 DIAGNOSIS — I63512 Cerebral infarction due to unspecified occlusion or stenosis of left middle cerebral artery: Secondary | ICD-10-CM | POA: Diagnosis not present

## 2020-07-12 DIAGNOSIS — M6281 Muscle weakness (generalized): Secondary | ICD-10-CM | POA: Diagnosis not present

## 2020-07-12 DIAGNOSIS — R1310 Dysphagia, unspecified: Secondary | ICD-10-CM | POA: Diagnosis not present

## 2020-07-12 DIAGNOSIS — R41841 Cognitive communication deficit: Secondary | ICD-10-CM | POA: Diagnosis not present

## 2020-07-12 DIAGNOSIS — I10 Essential (primary) hypertension: Secondary | ICD-10-CM | POA: Diagnosis not present

## 2020-07-13 DIAGNOSIS — I63512 Cerebral infarction due to unspecified occlusion or stenosis of left middle cerebral artery: Secondary | ICD-10-CM | POA: Diagnosis not present

## 2020-07-13 DIAGNOSIS — M6281 Muscle weakness (generalized): Secondary | ICD-10-CM | POA: Diagnosis not present

## 2020-07-13 DIAGNOSIS — R1310 Dysphagia, unspecified: Secondary | ICD-10-CM | POA: Diagnosis not present

## 2020-07-13 DIAGNOSIS — I10 Essential (primary) hypertension: Secondary | ICD-10-CM | POA: Diagnosis not present

## 2020-07-13 DIAGNOSIS — R41841 Cognitive communication deficit: Secondary | ICD-10-CM | POA: Diagnosis not present

## 2020-07-16 DIAGNOSIS — R41841 Cognitive communication deficit: Secondary | ICD-10-CM | POA: Diagnosis not present

## 2020-07-16 DIAGNOSIS — I10 Essential (primary) hypertension: Secondary | ICD-10-CM | POA: Diagnosis not present

## 2020-07-16 DIAGNOSIS — R1310 Dysphagia, unspecified: Secondary | ICD-10-CM | POA: Diagnosis not present

## 2020-07-16 DIAGNOSIS — M6281 Muscle weakness (generalized): Secondary | ICD-10-CM | POA: Diagnosis not present

## 2020-07-16 DIAGNOSIS — I63512 Cerebral infarction due to unspecified occlusion or stenosis of left middle cerebral artery: Secondary | ICD-10-CM | POA: Diagnosis not present

## 2020-07-17 DIAGNOSIS — R1310 Dysphagia, unspecified: Secondary | ICD-10-CM | POA: Diagnosis not present

## 2020-07-17 DIAGNOSIS — I10 Essential (primary) hypertension: Secondary | ICD-10-CM | POA: Diagnosis not present

## 2020-07-17 DIAGNOSIS — R41841 Cognitive communication deficit: Secondary | ICD-10-CM | POA: Diagnosis not present

## 2020-07-17 DIAGNOSIS — Z515 Encounter for palliative care: Secondary | ICD-10-CM | POA: Diagnosis not present

## 2020-07-17 DIAGNOSIS — I69320 Aphasia following cerebral infarction: Secondary | ICD-10-CM | POA: Diagnosis not present

## 2020-07-17 DIAGNOSIS — I69351 Hemiplegia and hemiparesis following cerebral infarction affecting right dominant side: Secondary | ICD-10-CM | POA: Diagnosis not present

## 2020-07-17 DIAGNOSIS — M6281 Muscle weakness (generalized): Secondary | ICD-10-CM | POA: Diagnosis not present

## 2020-07-17 DIAGNOSIS — I63512 Cerebral infarction due to unspecified occlusion or stenosis of left middle cerebral artery: Secondary | ICD-10-CM | POA: Diagnosis not present

## 2020-07-18 DIAGNOSIS — M6281 Muscle weakness (generalized): Secondary | ICD-10-CM | POA: Diagnosis not present

## 2020-07-18 DIAGNOSIS — I63512 Cerebral infarction due to unspecified occlusion or stenosis of left middle cerebral artery: Secondary | ICD-10-CM | POA: Diagnosis not present

## 2020-07-18 DIAGNOSIS — I10 Essential (primary) hypertension: Secondary | ICD-10-CM | POA: Diagnosis not present

## 2020-07-18 DIAGNOSIS — R41841 Cognitive communication deficit: Secondary | ICD-10-CM | POA: Diagnosis not present

## 2020-07-18 DIAGNOSIS — R1310 Dysphagia, unspecified: Secondary | ICD-10-CM | POA: Diagnosis not present

## 2020-07-19 DIAGNOSIS — M6281 Muscle weakness (generalized): Secondary | ICD-10-CM | POA: Diagnosis not present

## 2020-07-19 DIAGNOSIS — I10 Essential (primary) hypertension: Secondary | ICD-10-CM | POA: Diagnosis not present

## 2020-07-19 DIAGNOSIS — R41841 Cognitive communication deficit: Secondary | ICD-10-CM | POA: Diagnosis not present

## 2020-07-19 DIAGNOSIS — I63512 Cerebral infarction due to unspecified occlusion or stenosis of left middle cerebral artery: Secondary | ICD-10-CM | POA: Diagnosis not present

## 2020-07-19 DIAGNOSIS — R1310 Dysphagia, unspecified: Secondary | ICD-10-CM | POA: Diagnosis not present

## 2020-07-20 DIAGNOSIS — R41841 Cognitive communication deficit: Secondary | ICD-10-CM | POA: Diagnosis not present

## 2020-07-20 DIAGNOSIS — R1310 Dysphagia, unspecified: Secondary | ICD-10-CM | POA: Diagnosis not present

## 2020-07-20 DIAGNOSIS — I10 Essential (primary) hypertension: Secondary | ICD-10-CM | POA: Diagnosis not present

## 2020-07-20 DIAGNOSIS — I63512 Cerebral infarction due to unspecified occlusion or stenosis of left middle cerebral artery: Secondary | ICD-10-CM | POA: Diagnosis not present

## 2020-07-20 DIAGNOSIS — M6281 Muscle weakness (generalized): Secondary | ICD-10-CM | POA: Diagnosis not present

## 2020-07-23 DIAGNOSIS — R41841 Cognitive communication deficit: Secondary | ICD-10-CM | POA: Diagnosis not present

## 2020-07-23 DIAGNOSIS — R1310 Dysphagia, unspecified: Secondary | ICD-10-CM | POA: Diagnosis not present

## 2020-07-23 DIAGNOSIS — I10 Essential (primary) hypertension: Secondary | ICD-10-CM | POA: Diagnosis not present

## 2020-07-23 DIAGNOSIS — I63512 Cerebral infarction due to unspecified occlusion or stenosis of left middle cerebral artery: Secondary | ICD-10-CM | POA: Diagnosis not present

## 2020-07-23 DIAGNOSIS — M6281 Muscle weakness (generalized): Secondary | ICD-10-CM | POA: Diagnosis not present

## 2020-07-24 DIAGNOSIS — R41841 Cognitive communication deficit: Secondary | ICD-10-CM | POA: Diagnosis not present

## 2020-07-24 DIAGNOSIS — I63512 Cerebral infarction due to unspecified occlusion or stenosis of left middle cerebral artery: Secondary | ICD-10-CM | POA: Diagnosis not present

## 2020-07-24 DIAGNOSIS — M6281 Muscle weakness (generalized): Secondary | ICD-10-CM | POA: Diagnosis not present

## 2020-07-24 DIAGNOSIS — I10 Essential (primary) hypertension: Secondary | ICD-10-CM | POA: Diagnosis not present

## 2020-07-24 DIAGNOSIS — R1310 Dysphagia, unspecified: Secondary | ICD-10-CM | POA: Diagnosis not present

## 2020-07-25 DIAGNOSIS — M6281 Muscle weakness (generalized): Secondary | ICD-10-CM | POA: Diagnosis not present

## 2020-07-25 DIAGNOSIS — I10 Essential (primary) hypertension: Secondary | ICD-10-CM | POA: Diagnosis not present

## 2020-07-25 DIAGNOSIS — I63512 Cerebral infarction due to unspecified occlusion or stenosis of left middle cerebral artery: Secondary | ICD-10-CM | POA: Diagnosis not present

## 2020-07-25 DIAGNOSIS — R41841 Cognitive communication deficit: Secondary | ICD-10-CM | POA: Diagnosis not present

## 2020-07-25 DIAGNOSIS — R1310 Dysphagia, unspecified: Secondary | ICD-10-CM | POA: Diagnosis not present

## 2020-07-26 DIAGNOSIS — M6281 Muscle weakness (generalized): Secondary | ICD-10-CM | POA: Diagnosis not present

## 2020-07-26 DIAGNOSIS — R52 Pain, unspecified: Secondary | ICD-10-CM | POA: Diagnosis not present

## 2020-07-26 DIAGNOSIS — I679 Cerebrovascular disease, unspecified: Secondary | ICD-10-CM | POA: Diagnosis not present

## 2020-07-26 DIAGNOSIS — I63512 Cerebral infarction due to unspecified occlusion or stenosis of left middle cerebral artery: Secondary | ICD-10-CM | POA: Diagnosis not present

## 2020-07-26 DIAGNOSIS — I5022 Chronic systolic (congestive) heart failure: Secondary | ICD-10-CM | POA: Diagnosis not present

## 2020-07-26 DIAGNOSIS — I480 Paroxysmal atrial fibrillation: Secondary | ICD-10-CM | POA: Diagnosis not present

## 2020-07-26 DIAGNOSIS — N1832 Chronic kidney disease, stage 3b: Secondary | ICD-10-CM | POA: Diagnosis not present

## 2020-07-26 DIAGNOSIS — R41841 Cognitive communication deficit: Secondary | ICD-10-CM | POA: Diagnosis not present

## 2020-07-26 DIAGNOSIS — I10 Essential (primary) hypertension: Secondary | ICD-10-CM | POA: Diagnosis not present

## 2020-07-26 DIAGNOSIS — R1310 Dysphagia, unspecified: Secondary | ICD-10-CM | POA: Diagnosis not present

## 2020-07-27 DIAGNOSIS — R1310 Dysphagia, unspecified: Secondary | ICD-10-CM | POA: Diagnosis not present

## 2020-07-27 DIAGNOSIS — M6281 Muscle weakness (generalized): Secondary | ICD-10-CM | POA: Diagnosis not present

## 2020-07-27 DIAGNOSIS — I10 Essential (primary) hypertension: Secondary | ICD-10-CM | POA: Diagnosis not present

## 2020-07-27 DIAGNOSIS — R41841 Cognitive communication deficit: Secondary | ICD-10-CM | POA: Diagnosis not present

## 2020-07-27 DIAGNOSIS — I63512 Cerebral infarction due to unspecified occlusion or stenosis of left middle cerebral artery: Secondary | ICD-10-CM | POA: Diagnosis not present

## 2020-07-30 DIAGNOSIS — R41841 Cognitive communication deficit: Secondary | ICD-10-CM | POA: Diagnosis not present

## 2020-07-30 DIAGNOSIS — I10 Essential (primary) hypertension: Secondary | ICD-10-CM | POA: Diagnosis not present

## 2020-07-30 DIAGNOSIS — M6281 Muscle weakness (generalized): Secondary | ICD-10-CM | POA: Diagnosis not present

## 2020-07-30 DIAGNOSIS — I63512 Cerebral infarction due to unspecified occlusion or stenosis of left middle cerebral artery: Secondary | ICD-10-CM | POA: Diagnosis not present

## 2020-07-30 DIAGNOSIS — R1310 Dysphagia, unspecified: Secondary | ICD-10-CM | POA: Diagnosis not present

## 2020-07-31 DIAGNOSIS — R1310 Dysphagia, unspecified: Secondary | ICD-10-CM | POA: Diagnosis not present

## 2020-07-31 DIAGNOSIS — I63512 Cerebral infarction due to unspecified occlusion or stenosis of left middle cerebral artery: Secondary | ICD-10-CM | POA: Diagnosis not present

## 2020-07-31 DIAGNOSIS — M6281 Muscle weakness (generalized): Secondary | ICD-10-CM | POA: Diagnosis not present

## 2020-07-31 DIAGNOSIS — I10 Essential (primary) hypertension: Secondary | ICD-10-CM | POA: Diagnosis not present

## 2020-07-31 DIAGNOSIS — R41841 Cognitive communication deficit: Secondary | ICD-10-CM | POA: Diagnosis not present

## 2020-08-01 DIAGNOSIS — R41841 Cognitive communication deficit: Secondary | ICD-10-CM | POA: Diagnosis not present

## 2020-08-01 DIAGNOSIS — I63512 Cerebral infarction due to unspecified occlusion or stenosis of left middle cerebral artery: Secondary | ICD-10-CM | POA: Diagnosis not present

## 2020-08-01 DIAGNOSIS — I10 Essential (primary) hypertension: Secondary | ICD-10-CM | POA: Diagnosis not present

## 2020-08-01 DIAGNOSIS — R1310 Dysphagia, unspecified: Secondary | ICD-10-CM | POA: Diagnosis not present

## 2020-08-01 DIAGNOSIS — M6281 Muscle weakness (generalized): Secondary | ICD-10-CM | POA: Diagnosis not present

## 2020-08-02 DIAGNOSIS — R1310 Dysphagia, unspecified: Secondary | ICD-10-CM | POA: Diagnosis not present

## 2020-08-02 DIAGNOSIS — R41841 Cognitive communication deficit: Secondary | ICD-10-CM | POA: Diagnosis not present

## 2020-08-02 DIAGNOSIS — I63512 Cerebral infarction due to unspecified occlusion or stenosis of left middle cerebral artery: Secondary | ICD-10-CM | POA: Diagnosis not present

## 2020-08-02 DIAGNOSIS — M6281 Muscle weakness (generalized): Secondary | ICD-10-CM | POA: Diagnosis not present

## 2020-08-02 DIAGNOSIS — I10 Essential (primary) hypertension: Secondary | ICD-10-CM | POA: Diagnosis not present

## 2020-08-03 DIAGNOSIS — I10 Essential (primary) hypertension: Secondary | ICD-10-CM | POA: Diagnosis not present

## 2020-08-03 DIAGNOSIS — M6281 Muscle weakness (generalized): Secondary | ICD-10-CM | POA: Diagnosis not present

## 2020-08-03 DIAGNOSIS — R1310 Dysphagia, unspecified: Secondary | ICD-10-CM | POA: Diagnosis not present

## 2020-08-03 DIAGNOSIS — I63512 Cerebral infarction due to unspecified occlusion or stenosis of left middle cerebral artery: Secondary | ICD-10-CM | POA: Diagnosis not present

## 2020-08-03 DIAGNOSIS — R41841 Cognitive communication deficit: Secondary | ICD-10-CM | POA: Diagnosis not present

## 2020-08-06 DIAGNOSIS — R41841 Cognitive communication deficit: Secondary | ICD-10-CM | POA: Diagnosis not present

## 2020-08-06 DIAGNOSIS — I10 Essential (primary) hypertension: Secondary | ICD-10-CM | POA: Diagnosis not present

## 2020-08-06 DIAGNOSIS — I63512 Cerebral infarction due to unspecified occlusion or stenosis of left middle cerebral artery: Secondary | ICD-10-CM | POA: Diagnosis not present

## 2020-08-06 DIAGNOSIS — M6281 Muscle weakness (generalized): Secondary | ICD-10-CM | POA: Diagnosis not present

## 2020-08-06 DIAGNOSIS — R1310 Dysphagia, unspecified: Secondary | ICD-10-CM | POA: Diagnosis not present

## 2020-08-07 DIAGNOSIS — R41841 Cognitive communication deficit: Secondary | ICD-10-CM | POA: Diagnosis not present

## 2020-08-07 DIAGNOSIS — M6281 Muscle weakness (generalized): Secondary | ICD-10-CM | POA: Diagnosis not present

## 2020-08-07 DIAGNOSIS — I10 Essential (primary) hypertension: Secondary | ICD-10-CM | POA: Diagnosis not present

## 2020-08-07 DIAGNOSIS — R1310 Dysphagia, unspecified: Secondary | ICD-10-CM | POA: Diagnosis not present

## 2020-08-07 DIAGNOSIS — I63512 Cerebral infarction due to unspecified occlusion or stenosis of left middle cerebral artery: Secondary | ICD-10-CM | POA: Diagnosis not present

## 2020-08-14 DIAGNOSIS — R1310 Dysphagia, unspecified: Secondary | ICD-10-CM | POA: Diagnosis not present

## 2020-08-14 DIAGNOSIS — I63512 Cerebral infarction due to unspecified occlusion or stenosis of left middle cerebral artery: Secondary | ICD-10-CM | POA: Diagnosis not present

## 2020-08-14 DIAGNOSIS — I69351 Hemiplegia and hemiparesis following cerebral infarction affecting right dominant side: Secondary | ICD-10-CM | POA: Diagnosis not present

## 2020-08-14 DIAGNOSIS — I69320 Aphasia following cerebral infarction: Secondary | ICD-10-CM | POA: Diagnosis not present

## 2020-08-14 DIAGNOSIS — Z515 Encounter for palliative care: Secondary | ICD-10-CM | POA: Diagnosis not present

## 2020-08-30 DIAGNOSIS — S32000A Wedge compression fracture of unspecified lumbar vertebra, initial encounter for closed fracture: Secondary | ICD-10-CM | POA: Diagnosis not present

## 2020-08-30 DIAGNOSIS — Z79899 Other long term (current) drug therapy: Secondary | ICD-10-CM | POA: Diagnosis not present

## 2020-08-30 DIAGNOSIS — N1832 Chronic kidney disease, stage 3b: Secondary | ICD-10-CM | POA: Diagnosis not present

## 2020-08-30 DIAGNOSIS — I10 Essential (primary) hypertension: Secondary | ICD-10-CM | POA: Diagnosis not present

## 2020-08-30 DIAGNOSIS — R52 Pain, unspecified: Secondary | ICD-10-CM | POA: Diagnosis not present

## 2020-08-30 DIAGNOSIS — R69 Illness, unspecified: Secondary | ICD-10-CM | POA: Diagnosis not present

## 2020-09-13 ENCOUNTER — Emergency Department (HOSPITAL_COMMUNITY): Payer: Medicare HMO

## 2020-09-13 ENCOUNTER — Encounter (HOSPITAL_COMMUNITY): Payer: Self-pay

## 2020-09-13 ENCOUNTER — Other Ambulatory Visit: Payer: Self-pay

## 2020-09-13 ENCOUNTER — Emergency Department (HOSPITAL_COMMUNITY)
Admission: EM | Admit: 2020-09-13 | Discharge: 2020-09-14 | Disposition: A | Discharge status: Home / Self Care | Payer: Medicare HMO | Attending: Emergency Medicine | Admitting: Emergency Medicine

## 2020-09-13 DIAGNOSIS — I130 Hypertensive heart and chronic kidney disease with heart failure and stage 1 through stage 4 chronic kidney disease, or unspecified chronic kidney disease: Secondary | ICD-10-CM | POA: Insufficient documentation

## 2020-09-13 DIAGNOSIS — Z743 Need for continuous supervision: Secondary | ICD-10-CM | POA: Diagnosis not present

## 2020-09-13 DIAGNOSIS — S0003XA Contusion of scalp, initial encounter: Secondary | ICD-10-CM | POA: Diagnosis not present

## 2020-09-13 DIAGNOSIS — Z87891 Personal history of nicotine dependence: Secondary | ICD-10-CM | POA: Insufficient documentation

## 2020-09-13 DIAGNOSIS — S0083XA Contusion of other part of head, initial encounter: Secondary | ICD-10-CM | POA: Diagnosis not present

## 2020-09-13 DIAGNOSIS — Z79899 Other long term (current) drug therapy: Secondary | ICD-10-CM | POA: Diagnosis not present

## 2020-09-13 DIAGNOSIS — N1832 Chronic kidney disease, stage 3b: Secondary | ICD-10-CM | POA: Insufficient documentation

## 2020-09-13 DIAGNOSIS — W19XXXA Unspecified fall, initial encounter: Secondary | ICD-10-CM | POA: Insufficient documentation

## 2020-09-13 DIAGNOSIS — Z8616 Personal history of COVID-19: Secondary | ICD-10-CM | POA: Diagnosis not present

## 2020-09-13 DIAGNOSIS — I5022 Chronic systolic (congestive) heart failure: Secondary | ICD-10-CM | POA: Insufficient documentation

## 2020-09-13 DIAGNOSIS — R220 Localized swelling, mass and lump, head: Secondary | ICD-10-CM | POA: Diagnosis not present

## 2020-09-13 DIAGNOSIS — Z043 Encounter for examination and observation following other accident: Secondary | ICD-10-CM | POA: Diagnosis not present

## 2020-09-13 DIAGNOSIS — S0990XA Unspecified injury of head, initial encounter: Secondary | ICD-10-CM | POA: Diagnosis not present

## 2020-09-13 NOTE — ED Provider Notes (Signed)
[facility] EMERGENCY DEPARTMENT Provider Note   CSN: [id number] Arrival date & time: 09/13/20  1812     History Chief Complaint  Patient presents with   Brian Mclaughlin is a 75 y.o. male with past medical history of CHF, A. fib, CVA, rhabdo, hypertension, hyperlipidemia, who presents today for evaluation of a unwitnessed fall.  He is residing at [facility] and had a unwitnessed fall about 30 minutes prior to arrival.  Patient is nonambulatory and nonverbal at baseline per staff.  Level 5 caveat patient is nonverbal  HPI     Past Medical History:  Diagnosis Date   Abnormality of gait 06/14/2014   Acute CVA (cerebrovascular accident) ([place]) 05/24/2014   Acute kidney failure, unspecified ([place])    Acute, but ill-defined, cerebrovascular disease    Alcohol abuse    Altered mental status    Atrial fibrillation (HCC)    Chronic systolic CHF (congestive heart failure) (HCC)    CVA (cerebral infarction)    ETOH abuse    Hyperlipidemia    Hypertension    Nonspecific elevation of levels of transaminase or lactic acid dehydrogenase (LDH)    Rhabdomyolysis    Tobacco abuse    Tobacco use disorder     Patient Active Problem List   Diagnosis Date Noted   Pressure injury of skin 03/23/2020   Acute cerebrovascular accident (CVA) due to occlusion of left middle cerebral artery ([place]) 03/22/2020   Failure to thrive in adult 03/20/2020   COVID-19 virus infection 03/20/2020   Acute encephalopathy 01/06/2020   Fall 01/06/2020   Nicotine dependence, cigarettes, uncomplicated 37/34/2876   Mixed hyperlipidemia 07/28/2019   Hyperkalemia 07/28/2019   Cellulitis of right lower extremity 07/25/2019   Generalized weakness 03/10/2018   Fever 03/10/2018   Chronic kidney disease, stage 3b ([place]) 03/10/2018   Abnormality of gait 06/14/2014   Chronic atrial fibrillation (HCC)    Stroke, embolic (HCC)    Noncompliance with medications    Acute ischemic stroke ([place]) 05/24/2014   CVA (cerebral  infarction) 81/15/7262   Chronic systolic CHF (congestive heart failure) ([place])    Stroke ([place]) 08/23/2011   HTN (hypertension) 08/23/2011   Alcohol abuse 08/23/2011   LFTs abnormal 08/23/2011   Acute renal failure ([place]) 08/23/2011   Rhabdomyolysis 08/23/2011    Past Surgical History:  Procedure Laterality Date   BACK SURGERY  10 years ago   "slipped disc" repair   EYE MUSCLE SURGERY     as a teenager "tighten his muscles"       Family History  Problem Relation Age of Onset   Cancer Mother    Cancer Father    Multiple sclerosis Daughter     Social History   Tobacco Use   Smoking status: Former    Packs/day: 0.50    Years: 45.00    Pack years: 22.50    Types: Cigarettes   Smokeless tobacco: Never  Substance Use Topics   Alcohol use: Yes    Comment: Drinks 6-8 beers daily   Drug use: No    Comment: denies any drug usage    Home Medications Prior to Admission medications   Medication Sig Start Date End Date Taking? Authorizing Provider  atorvastatin (LIPITOR) 20 MG tablet Take 1 tablet (20 mg total) by mouth at bedtime. Patient not taking: No sig reported 03/12/18   Mendel Corning, MD    Allergies    Codeine and Penicillins  Review of Systems   Review of Systems  Unable to perform ROS: Patient nonverbal   Physical Exam Updated Vital Signs BP 125/78   Pulse 88   Temp 98 F (36.7 C)   Resp 19   Ht $R'5\' 10"'Zb$  (1.778 m)   Wt 97.8 kg   SpO2 99%   BMI 30.94 kg/m   Physical Exam Vitals and nursing note reviewed.  Constitutional:      Appearance: He is not diaphoretic.     Comments: Chronically ill-appearing  HENT:     Head: Normocephalic.     Comments: Contusion on forehead, superficial abrasion.  No raccoon's eyes or battle signs bilaterally. Eyes:     General: No scleral icterus.       Right eye: No discharge.        Left eye: No discharge.     Conjunctiva/sclera: Conjunctivae normal.  Cardiovascular:     Rate and Rhythm: Normal rate and regular  rhythm.     Comments: 2+ bilateral radial pulses, 1+ bilateral DP pulses bilaterally Pulmonary:     Effort: Pulmonary effort is normal. No respiratory distress.     Breath sounds: No stridor.  Abdominal:     General: There is no distension.     Tenderness: There is no abdominal tenderness.  Musculoskeletal:        General: No deformity.     Cervical back: Normal range of motion.     Right lower leg: No edema.     Left lower leg: No edema.     Comments: Bilateral arms and legs palpated without obvious deformity or crepitus.  Compartments are soft and easily compressible.  I am able to move both of his hips without any indication of pain.  No significant tenderness over the left anterior shin.  There is no crepitus or deformity.  There is wound however unclear if this is related to the fall or not.  Skin:    General: Skin is warm and dry.     Comments: Scaling of bilateral lower extremities. Superficial abrasion over left shin.    Neurological:     Mental Status: Mental status is at baseline.     Motor: No abnormal muscle tone.     Comments: Patient awakens to voice.  He is able to close his eyes when instructed to.  He is nonverbal.  He has movement in all 4 extremities spontaneously.  Psychiatric:     Comments: Unable to assess, non verbal.  Resting comfortably, not agitated.     ED Results / Procedures / Treatments   Labs (all labs ordered are listed, but only abnormal results are displayed) Labs Reviewed - No data to display  EKG None  Radiology CT Head Wo Contrast  Result Date: 09/13/2020 CLINICAL DATA:  Un witnessed fall with soft tissue swelling of the forehead. EXAM: CT HEAD WITHOUT CONTRAST CT MAXILLOFACIAL WITHOUT CONTRAST CT CERVICAL SPINE WITHOUT CONTRAST TECHNIQUE: Multidetector CT imaging of the head, cervical spine, and maxillofacial structures were performed using the standard protocol without intravenous contrast. Multiplanar CT image reconstructions of the  cervical spine and maxillofacial structures were also generated. COMPARISON:  CT head and cervical spine dated 03/16/2020, MR brain dated 03/21/2020. FINDINGS: CT HEAD FINDINGS Brain: No evidence of acute infarction, hemorrhage, hydrocephalus, extra-axial collection or mass lesion/mass effect. Chronic large volume left middle cerebral artery territory infarct primarily involving the left temporal lobe, but also involving the left parietal lobe is noted. There is moderate cerebral volume loss with associated ex vacuo dilatation. Periventricular white matter hypoattenuation likely represents  chronic small vessel ischemic disease. Vascular: There are vascular calcifications in the carotid siphons. Skull: Normal. Negative for fracture or focal lesion. Other: None. CT MAXILLOFACIAL FINDINGS Osseous: No fracture or mandibular dislocation. No destructive process. Orbits: Negative. No traumatic or inflammatory finding. Sinuses: There is opacification of the right mastoid air cells, unchanged. A small left mastoid effusion is also unchanged. Right mastoid mucous retention cyst versus sinus disease is noted. Soft tissues: There is soft tissue swelling of the right forehead and scalp. CT CERVICAL SPINE FINDINGS Alignment: Normal. Skull base and vertebrae: No acute fracture. No primary bone lesion or focal pathologic process. Soft tissues and spinal canal: No prevertebral fluid or swelling. No visible canal hematoma. Disc levels: Up to severe multilevel degenerative disc and joint disease. Upper chest: Negative. Other: None. IMPRESSION: 1. No acute intracranial process. 2. No acute osseous injury in the cervical spine. 3. No acute facial bone fracture. Electronically Signed   By: Zerita Boers M.D.   On: 09/13/2020 20:46   CT Cervical Spine Wo Contrast  Result Date: 09/13/2020 CLINICAL DATA:  Un witnessed fall with soft tissue swelling of the forehead. EXAM: CT HEAD WITHOUT CONTRAST CT MAXILLOFACIAL WITHOUT CONTRAST CT  CERVICAL SPINE WITHOUT CONTRAST TECHNIQUE: Multidetector CT imaging of the head, cervical spine, and maxillofacial structures were performed using the standard protocol without intravenous contrast. Multiplanar CT image reconstructions of the cervical spine and maxillofacial structures were also generated. COMPARISON:  CT head and cervical spine dated 03/16/2020, MR brain dated 03/21/2020. FINDINGS: CT HEAD FINDINGS Brain: No evidence of acute infarction, hemorrhage, hydrocephalus, extra-axial collection or mass lesion/mass effect. Chronic large volume left middle cerebral artery territory infarct primarily involving the left temporal lobe, but also involving the left parietal lobe is noted. There is moderate cerebral volume loss with associated ex vacuo dilatation. Periventricular white matter hypoattenuation likely represents chronic small vessel ischemic disease. Vascular: There are vascular calcifications in the carotid siphons. Skull: Normal. Negative for fracture or focal lesion. Other: None. CT MAXILLOFACIAL FINDINGS Osseous: No fracture or mandibular dislocation. No destructive process. Orbits: Negative. No traumatic or inflammatory finding. Sinuses: There is opacification of the right mastoid air cells, unchanged. A small left mastoid effusion is also unchanged. Right mastoid mucous retention cyst versus sinus disease is noted. Soft tissues: There is soft tissue swelling of the right forehead and scalp. CT CERVICAL SPINE FINDINGS Alignment: Normal. Skull base and vertebrae: No acute fracture. No primary bone lesion or focal pathologic process. Soft tissues and spinal canal: No prevertebral fluid or swelling. No visible canal hematoma. Disc levels: Up to severe multilevel degenerative disc and joint disease. Upper chest: Negative. Other: None. IMPRESSION: 1. No acute intracranial process. 2. No acute osseous injury in the cervical spine. 3. No acute facial bone fracture. Electronically Signed   By: Zerita Boers M.D.   On: 09/13/2020 20:46   CT Maxillofacial Wo Contrast  Result Date: 09/13/2020 CLINICAL DATA:  Un witnessed fall with soft tissue swelling of the forehead. EXAM: CT HEAD WITHOUT CONTRAST CT MAXILLOFACIAL WITHOUT CONTRAST CT CERVICAL SPINE WITHOUT CONTRAST TECHNIQUE: Multidetector CT imaging of the head, cervical spine, and maxillofacial structures were performed using the standard protocol without intravenous contrast. Multiplanar CT image reconstructions of the cervical spine and maxillofacial structures were also generated. COMPARISON:  CT head and cervical spine dated 03/16/2020, MR brain dated 03/21/2020. FINDINGS: CT HEAD FINDINGS Brain: No evidence of acute infarction, hemorrhage, hydrocephalus, extra-axial collection or mass lesion/mass effect. Chronic large volume left middle cerebral artery territory  infarct primarily involving the left temporal lobe, but also involving the left parietal lobe is noted. There is moderate cerebral volume loss with associated ex vacuo dilatation. Periventricular white matter hypoattenuation likely represents chronic small vessel ischemic disease. Vascular: There are vascular calcifications in the carotid siphons. Skull: Normal. Negative for fracture or focal lesion. Other: None. CT MAXILLOFACIAL FINDINGS Osseous: No fracture or mandibular dislocation. No destructive process. Orbits: Negative. No traumatic or inflammatory finding. Sinuses: There is opacification of the right mastoid air cells, unchanged. A small left mastoid effusion is also unchanged. Right mastoid mucous retention cyst versus sinus disease is noted. Soft tissues: There is soft tissue swelling of the right forehead and scalp. CT CERVICAL SPINE FINDINGS Alignment: Normal. Skull base and vertebrae: No acute fracture. No primary bone lesion or focal pathologic process. Soft tissues and spinal canal: No prevertebral fluid or swelling. No visible canal hematoma. Disc levels: Up to severe multilevel  degenerative disc and joint disease. Upper chest: Negative. Other: None. IMPRESSION: 1. No acute intracranial process. 2. No acute osseous injury in the cervical spine. 3. No acute facial bone fracture. Electronically Signed   By: Zerita Boers M.D.   On: 09/13/2020 20:46    Procedures Procedures   Medications Ordered in ED Medications - No data to display  ED Course  I have reviewed the triage vital signs and the nursing notes.  Pertinent labs & imaging results that were available during my care of the patient were reviewed by me and considered in my medical decision making (see chart for details).  Clinical Course as of 09/13/20 2349  Thu Sep 13, 2020  1947 Attempted to call family member, no answer.  [EH]    Clinical Course User Index [EH] Lorin Glass, PA-C   MDM Rules/Calculators/A&P                          Brian Mclaughlin is a 75 year old man who presents today from SNF for unwitnessed fall about 30 minutes prior to arrival.  He has a contusion on his forehead/face.  He is nonverbal and nonambulatory at baseline per staff at [facility]. He does have a DNR and MOST form with him indicating primarily comfort care.  I attempted to speak with family member listed as contact to further establish goals of care including imaging, was unable to do so.  Given that patient was able to tolerate CT scans with out requiring sedation these were obtained.   CT head, face, neck is obtained without acute abnormalities or significant fracture. Patient does not have evidence of acute traumatic injury to chest, abdomen or pelvis.  Bilateral upper and lower extremities have compartments that are easily compressible, no obvious deformities or crepitus noted.  Of note his MOST says that he wants comfort measures, do not transfer to hospital unless comfort needs cannot be met in current location and goal to be to use medications and measures to relieve pain and suffering and for comfort.  Patient is  currently resting comfortably in no distress and appears safe for transfer back to his SNF in line with his MOST.   Patient will be discharged back to [facility].  This patient was seen as a shared visit with Dr. Roderic Palau.   Note: Portions of this report may have been transcribed using voice recognition software. Every effort was made to ensure accuracy; however, inadvertent computerized transcription errors may be present.   Final Clinical Impression(s) / ED Diagnoses Final diagnoses:  Fall, initial encounter  Contusion of scalp, initial encounter    Rx / DC Orders ED Discharge Orders     None        Ollen Gross 09/14/20 0001    Milton Ferguson, MD 09/14/20 1040

## 2020-09-13 NOTE — ED Triage Notes (Signed)
BIB [facility] from [facility] for an unwittnessed fall approx. 30 min prior to arrival. Knot noted to forehead. Unknown LOC, pt is nonverbal per staff.

## 2020-09-14 DIAGNOSIS — Z7401 Bed confinement status: Secondary | ICD-10-CM | POA: Diagnosis not present

## 2020-09-14 DIAGNOSIS — R279 Unspecified lack of coordination: Secondary | ICD-10-CM | POA: Diagnosis not present

## 2020-09-14 DIAGNOSIS — W19XXXA Unspecified fall, initial encounter: Secondary | ICD-10-CM | POA: Diagnosis not present

## 2020-09-14 DIAGNOSIS — R4182 Altered mental status, unspecified: Secondary | ICD-10-CM | POA: Diagnosis not present

## 2020-09-25 DIAGNOSIS — Z515 Encounter for palliative care: Secondary | ICD-10-CM | POA: Diagnosis not present

## 2020-09-25 DIAGNOSIS — I69320 Aphasia following cerebral infarction: Secondary | ICD-10-CM | POA: Diagnosis not present

## 2020-09-25 DIAGNOSIS — I69351 Hemiplegia and hemiparesis following cerebral infarction affecting right dominant side: Secondary | ICD-10-CM | POA: Diagnosis not present

## 2020-09-25 DIAGNOSIS — I63512 Cerebral infarction due to unspecified occlusion or stenosis of left middle cerebral artery: Secondary | ICD-10-CM | POA: Diagnosis not present

## 2020-09-25 DIAGNOSIS — R1310 Dysphagia, unspecified: Secondary | ICD-10-CM | POA: Diagnosis not present

## 2020-10-02 DIAGNOSIS — I679 Cerebrovascular disease, unspecified: Secondary | ICD-10-CM | POA: Diagnosis not present

## 2020-10-02 DIAGNOSIS — I5022 Chronic systolic (congestive) heart failure: Secondary | ICD-10-CM | POA: Diagnosis not present

## 2020-10-02 DIAGNOSIS — E785 Hyperlipidemia, unspecified: Secondary | ICD-10-CM | POA: Diagnosis not present

## 2020-11-22 DIAGNOSIS — I10 Essential (primary) hypertension: Secondary | ICD-10-CM | POA: Diagnosis not present

## 2020-11-22 DIAGNOSIS — I63512 Cerebral infarction due to unspecified occlusion or stenosis of left middle cerebral artery: Secondary | ICD-10-CM | POA: Diagnosis not present

## 2020-11-22 DIAGNOSIS — I679 Cerebrovascular disease, unspecified: Secondary | ICD-10-CM | POA: Diagnosis not present

## 2020-11-22 DIAGNOSIS — R69 Illness, unspecified: Secondary | ICD-10-CM | POA: Diagnosis not present

## 2020-11-22 DIAGNOSIS — Z79899 Other long term (current) drug therapy: Secondary | ICD-10-CM | POA: Diagnosis not present

## 2020-11-22 DIAGNOSIS — I5022 Chronic systolic (congestive) heart failure: Secondary | ICD-10-CM | POA: Diagnosis not present

## 2020-11-23 DIAGNOSIS — Z23 Encounter for immunization: Secondary | ICD-10-CM | POA: Diagnosis not present

## 2021-01-03 DIAGNOSIS — I63512 Cerebral infarction due to unspecified occlusion or stenosis of left middle cerebral artery: Secondary | ICD-10-CM | POA: Diagnosis not present

## 2021-01-03 DIAGNOSIS — R1310 Dysphagia, unspecified: Secondary | ICD-10-CM | POA: Diagnosis not present

## 2021-01-03 DIAGNOSIS — I69351 Hemiplegia and hemiparesis following cerebral infarction affecting right dominant side: Secondary | ICD-10-CM | POA: Diagnosis not present

## 2021-01-03 DIAGNOSIS — I69320 Aphasia following cerebral infarction: Secondary | ICD-10-CM | POA: Diagnosis not present

## 2021-01-03 DIAGNOSIS — Z515 Encounter for palliative care: Secondary | ICD-10-CM | POA: Diagnosis not present

## 2021-01-24 DIAGNOSIS — I5022 Chronic systolic (congestive) heart failure: Secondary | ICD-10-CM | POA: Diagnosis not present

## 2021-01-24 DIAGNOSIS — N1832 Chronic kidney disease, stage 3b: Secondary | ICD-10-CM | POA: Diagnosis not present

## 2021-01-24 DIAGNOSIS — I10 Essential (primary) hypertension: Secondary | ICD-10-CM | POA: Diagnosis not present

## 2021-01-24 DIAGNOSIS — R52 Pain, unspecified: Secondary | ICD-10-CM | POA: Diagnosis not present

## 2021-01-24 DIAGNOSIS — I63512 Cerebral infarction due to unspecified occlusion or stenosis of left middle cerebral artery: Secondary | ICD-10-CM | POA: Diagnosis not present

## 2021-01-24 DIAGNOSIS — I480 Paroxysmal atrial fibrillation: Secondary | ICD-10-CM | POA: Diagnosis not present

## 2021-01-24 DIAGNOSIS — I679 Cerebrovascular disease, unspecified: Secondary | ICD-10-CM | POA: Diagnosis not present

## 2021-01-24 DIAGNOSIS — R69 Illness, unspecified: Secondary | ICD-10-CM | POA: Diagnosis not present

## 2021-02-18 DIAGNOSIS — I63512 Cerebral infarction due to unspecified occlusion or stenosis of left middle cerebral artery: Secondary | ICD-10-CM | POA: Diagnosis not present

## 2021-02-18 DIAGNOSIS — I69351 Hemiplegia and hemiparesis following cerebral infarction affecting right dominant side: Secondary | ICD-10-CM | POA: Diagnosis not present

## 2021-02-18 DIAGNOSIS — I69320 Aphasia following cerebral infarction: Secondary | ICD-10-CM | POA: Diagnosis not present

## 2021-02-18 DIAGNOSIS — Z515 Encounter for palliative care: Secondary | ICD-10-CM | POA: Diagnosis not present

## 2021-02-18 DIAGNOSIS — R1310 Dysphagia, unspecified: Secondary | ICD-10-CM | POA: Diagnosis not present

## 2021-06-20 ENCOUNTER — Emergency Department (HOSPITAL_COMMUNITY)
Admission: EM | Admit: 2021-06-20 | Discharge: 2021-06-20 | Disposition: A | Discharge status: Home / Self Care | Payer: Medicare HMO | Attending: Emergency Medicine | Admitting: Emergency Medicine

## 2021-06-20 ENCOUNTER — Encounter (HOSPITAL_COMMUNITY): Payer: Self-pay

## 2021-06-20 ENCOUNTER — Emergency Department (HOSPITAL_COMMUNITY): Payer: Medicare HMO

## 2021-06-20 ENCOUNTER — Other Ambulatory Visit: Payer: Self-pay

## 2021-06-20 DIAGNOSIS — S0990XA Unspecified injury of head, initial encounter: Secondary | ICD-10-CM | POA: Insufficient documentation

## 2021-06-20 DIAGNOSIS — I110 Hypertensive heart disease with heart failure: Secondary | ICD-10-CM | POA: Diagnosis not present

## 2021-06-20 DIAGNOSIS — Z79899 Other long term (current) drug therapy: Secondary | ICD-10-CM | POA: Diagnosis not present

## 2021-06-20 DIAGNOSIS — I509 Heart failure, unspecified: Secondary | ICD-10-CM | POA: Diagnosis not present

## 2021-06-20 DIAGNOSIS — W19XXXA Unspecified fall, initial encounter: Secondary | ICD-10-CM

## 2021-06-20 DIAGNOSIS — W06XXXA Fall from bed, initial encounter: Secondary | ICD-10-CM | POA: Diagnosis not present

## 2021-06-20 DIAGNOSIS — R001 Bradycardia, unspecified: Secondary | ICD-10-CM | POA: Insufficient documentation

## 2021-06-20 LAB — CBC WITH DIFFERENTIAL/PLATELET
Abs Immature Granulocytes: 0.02 10*3/uL (ref 0.00–0.07)
Basophils Absolute: 0.1 10*3/uL (ref 0.0–0.1)
Basophils Relative: 1 %
Eosinophils Absolute: 0.2 10*3/uL (ref 0.0–0.5)
Eosinophils Relative: 3 %
HCT: 42 % (ref 39.0–52.0)
Hemoglobin: 13.9 g/dL (ref 13.0–17.0)
Immature Granulocytes: 0 %
Lymphocytes Relative: 33 %
Lymphs Abs: 2.3 10*3/uL (ref 0.7–4.0)
MCH: 33.7 pg (ref 26.0–34.0)
MCHC: 33.1 g/dL (ref 30.0–36.0)
MCV: 101.9 fL — ABNORMAL HIGH (ref 80.0–100.0)
Monocytes Absolute: 0.6 10*3/uL (ref 0.1–1.0)
Monocytes Relative: 9 %
Neutro Abs: 3.9 10*3/uL (ref 1.7–7.7)
Neutrophils Relative %: 54 %
Platelets: 136 10*3/uL — ABNORMAL LOW (ref 150–400)
RBC: 4.12 MIL/uL — ABNORMAL LOW (ref 4.22–5.81)
RDW: 13.2 % (ref 11.5–15.5)
WBC: 7.1 10*3/uL (ref 4.0–10.5)
nRBC: 0 % (ref 0.0–0.2)

## 2021-06-20 LAB — BASIC METABOLIC PANEL
Anion gap: 4 — ABNORMAL LOW (ref 5–15)
BUN: 29 mg/dL — ABNORMAL HIGH (ref 8–23)
CO2: 27 mmol/L (ref 22–32)
Calcium: 8.7 mg/dL — ABNORMAL LOW (ref 8.9–10.3)
Chloride: 108 mmol/L (ref 98–111)
Creatinine, Ser: 1.35 mg/dL — ABNORMAL HIGH (ref 0.61–1.24)
GFR, Estimated: 54 mL/min — ABNORMAL LOW (ref >60)
Glucose, Bld: 92 mg/dL (ref 70–99)
Potassium: 4.4 mmol/L (ref 3.5–5.1)
Sodium: 139 mmol/L (ref 135–145)

## 2021-06-20 NOTE — ED Provider Notes (Addendum)
?[place] ?Provider Note ? ? ?CSN: [id number] ?Arrival date & time: 06/20/21  1231 ? ?  ? ?History ? ?Chief Complaint  ?Patient presents with  ? Fall  ? ? ?Brian Mclaughlin is a 76 y.o. male with medical history significant for acute CVA in 2016, kidney failure, alcohol abuse, A-fib, CHF, hypertension.  Patient presents ED for evaluation of fall.  Patient is nonverbal at baseline and cannot answer provider questions.  Per triage note, EMS, patient was at his assisted living facility earlier today when he had a witnessed fall from his bed.  According to the staff at [facility], patient did hit his head. ? ?Level 5 caveat patient is nonverbal. ? ? ?Fall ? ? ?  ? ?Home Medications ?Prior to Admission medications   ?Medication Sig Start Date End Date Taking? Authorizing Provider  ?atorvastatin (LIPITOR) 20 MG tablet Take 1 tablet (20 mg total) by mouth at bedtime. ?Patient not taking: No sig reported 03/12/18   Mendel Corning, MD  ?   ? ?Allergies    ?Codeine and Penicillins   ? ?Review of Systems   ?Review of Systems  ?Unable to perform ROS: Patient nonverbal (Level 5 caveat)  ? ?Physical Exam ?Updated Vital Signs ?BP (!) 157/93 (BP Location: Right Arm)   Pulse (!) 56   Temp 97.6 ?F (36.4 ?C) (Oral)   Resp 20   Ht 5\' 10"  (1.778 m)   Wt 79.4 kg   SpO2 98%   BMI 25.11 kg/m?  ?Physical Exam ?Vitals and nursing note reviewed.  ?Constitutional:   ?   Appearance: He is not ill-appearing, toxic-appearing or diaphoretic.  ?   Comments: Patient tracks providers enters the room.  Patient tracks provider across room.  Patient alert to baseline.  ?HENT:  ?   Head: Normocephalic and atraumatic.  ?   Nose: Nose normal.  ?   Mouth/Throat:  ?   Mouth: Mucous membranes are dry.  ?Eyes:  ?   General:     ?   Right eye: No discharge.     ?   Left eye: No discharge.  ?   Conjunctiva/sclera: Conjunctivae normal.  ?   Pupils: Pupils are equal, round, and reactive to light.  ?Cardiovascular:  ?   Rate and Rhythm:  Regular rhythm. Bradycardia present.  ?   Pulses:     ?     Radial pulses are 2+ on the right side and 2+ on the left side.  ?     Dorsalis pedis pulses are detected w/ Doppler on the right side and detected w/ Doppler on the left side.  ?   Comments: 2+ radial pulses bilaterally.  DP pulses found with Doppler ?Pulmonary:  ?   Effort: Pulmonary effort is normal.  ?   Breath sounds: Normal breath sounds. No wheezing.  ?Abdominal:  ?   General: Abdomen is flat. Bowel sounds are normal.  ?   Palpations: Abdomen is soft.  ?   Tenderness: There is no abdominal tenderness.  ?Musculoskeletal:  ?   Cervical back: Normal range of motion and neck supple.  ?   Comments: Bilateral arms and legs palpated without obvious deformity or crepitus.  Compartments are soft and easily compressible.  I am able to move both of his hips without any indication of pain.  ?Skin: ?   General: Skin is warm and dry.  ?   Capillary Refill: Capillary refill takes less than 2 seconds.  ?Neurological:  ?  Mental Status: Mental status is at baseline.  ?   Comments: Patient awakens to voice.  He is able to close his eyes when instructed to.  He is nonverbal.  He has movement in all 4 extremities spontaneously.   ? ? ?ED Results / Procedures / Treatments   ?Labs ?(all labs ordered are listed, but only abnormal results are displayed) ?Labs Reviewed  ?CBC WITH DIFFERENTIAL/PLATELET - Abnormal; Notable for the following components:  ?    Result Value  ? RBC 4.12 (*)   ? MCV 101.9 (*)   ? Platelets 136 (*)   ? All other components within normal limits  ?BASIC METABOLIC PANEL - Abnormal; Notable for the following components:  ? BUN 29 (*)   ? Creatinine, Ser 1.35 (*)   ? Calcium 8.7 (*)   ? GFR, Estimated 54 (*)   ? Anion gap 4 (*)   ? All other components within normal limits  ? ? ?EKG ?EKG Interpretation ? ?Date/Time:  Thursday June 20 2021 15:11:59 EDT ?Ventricular Rate:  45 ?PR Interval:    ?QRS Duration: 122 ?QT Interval:  509 ?QTC Calculation: 441 ?R  Axis:   13 ?Text Interpretation: Atrial fibrillation IVCD, consider atypical LBBB rate? slower but otherwise ECG similar to Jan 2022 Confirmed by Sherwood Gambler [phone]) on 06/20/2021 3:18:20 PM ? ?Radiology ?CT Head Wo Contrast ? ?Result Date: 06/20/2021 ?CLINICAL DATA:  Head injury after fall. EXAM: CT HEAD WITHOUT CONTRAST CT CERVICAL SPINE WITHOUT CONTRAST TECHNIQUE: Multidetector CT imaging of the head and cervical spine was performed following the standard protocol without intravenous contrast. Multiplanar CT image reconstructions of the cervical spine were also generated. RADIATION DOSE REDUCTION: This exam was performed according to the departmental dose-optimization program which includes automated exposure control, adjustment of the mA and/or kV according to patient size and/or use of iterative reconstruction technique. COMPARISON:  September 13, 2020. FINDINGS: CT HEAD FINDINGS Brain: Left temporoparietal encephalomalacia is noted consistent with old infarction. Mild diffuse cortical atrophy is noted. No mass effect or midline shift is noted. Ventricular size is within normal limits. There is no evidence of mass lesion, hemorrhage or acute infarction. Vascular: No hyperdense vessel or unexpected calcification. Skull: Normal. Negative for fracture or focal lesion. Sinuses/Orbits: No acute finding. Other: Fluid is noted in mastoid air cells bilaterally. CT CERVICAL SPINE FINDINGS Alignment: Normal. Skull base and vertebrae: No acute fracture. No primary bone lesion or focal pathologic process. Soft tissues and spinal canal: No prevertebral fluid or swelling. No visible canal hematoma. Disc levels: Severe degenerative disc disease is noted at C3-4, C5-6 and C6-7. Mild degenerative disc disease is noted at C4-5. Upper chest: Negative. Other: None. IMPRESSION: No acute intracranial abnormality. 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 No acute abnormality is noted. Electronically Signed    By: Marijo Conception M.D.   On: 06/20/2021 14:52  ? ?CT Cervical Spine Wo Contrast ? ?Result Date: 06/20/2021 ?CLINICAL DATA:  Head injury after fall. EXAM: CT HEAD WITHOUT CONTRAST CT CERVICAL SPINE WITHOUT CONTRAST TECHNIQUE: Multidetector CT imaging of the head and cervical spine was performed following the standard protocol without intravenous contrast. Multiplanar CT image reconstructions of the cervical spine were also generated. RADIATION DOSE REDUCTION: This exam was performed according to the departmental dose-optimization program which includes automated exposure control, adjustment of the mA and/or kV according to patient size and/or use of iterative reconstruction technique. COMPARISON:  September 13, 2020. FINDINGS: CT HEAD FINDINGS Brain: Left temporoparietal encephalomalacia is noted  consistent with old infarction. Mild diffuse cortical atrophy is noted. No mass effect or midline shift is noted. Ventricular size is within normal limits. There is no evidence of mass lesion, hemorrhage or acute infarction. Vascular: No hyperdense vessel or unexpected calcification. Skull: Normal. Negative for fracture or focal lesion. Sinuses/Orbits: No acute finding. Other: Fluid is noted in mastoid air cells bilaterally. CT CERVICAL SPINE FINDINGS Alignment: Normal. Skull base and vertebrae: No acute fracture. No primary bone lesion or focal pathologic process. Soft tissues and spinal canal: No prevertebral fluid or swelling. No visible canal hematoma. Disc levels: Severe degenerative disc disease is noted at C3-4, C5-6 and C6-7. Mild degenerative disc disease is noted at C4-5. Upper chest: Negative. Other: None. IMPRESSION: No acute intracranial abnormality. 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 No acute abnormality is noted. Electronically Signed   By: Marijo Conception M.D.   On: 06/20/2021 14:52   ? ?Procedures ?Procedures  ? ? ?Medications Ordered in ED ?Medications - No data to display ? ?ED  Course/ Medical Decision Making/ A&P ?  ?                        ?Medical Decision Making ?Amount and/or Complexity of Data Reviewed ?Labs: ordered. ?Radiology: ordered. ? ? ?76 year old male presents to ED

## 2021-06-20 NOTE — ED Triage Notes (Signed)
Patient via EMS from [facility] due to fall from bed. Did hit head per [facility].  ?

## 2021-06-20 NOTE — Discharge Instructions (Addendum)
Return to the ED with any new or worsening symptoms ?Please follow-up with your PCP ?I attempted to contact family members today.  No answer. ?

## 2021-08-15 DEATH — deceased

## 2021-10-21 IMAGING — CT CT CERVICAL SPINE W/O CM
3 of 4 series · 13 of 35 positions shown, 16 images · non-contrast
Comparison: CT head and cervical spine dated 03/16/2020, MR brain
dated 03/21/2020.

CLINICAL DATA: Un witnessed fall with soft tissue swelling of the
forehead.

EXAM:
CT HEAD WITHOUT CONTRAST
CT MAXILLOFACIAL WITHOUT CONTRAST
CT CERVICAL SPINE WITHOUT CONTRAST
TECHNIQUE: Multidetector CT imaging of the head, cervical spine, and
maxillofacial structures were performed using the standard protocol
without intravenous contrast. Multiplanar CT image reconstructions
of the cervical spine and maxillofacial structures were also
generated.

[Series 5: sag bone · sagittal · 0.30mm/px · 5 of 61 slices shown, 6 images]
[im 21/61  bone]
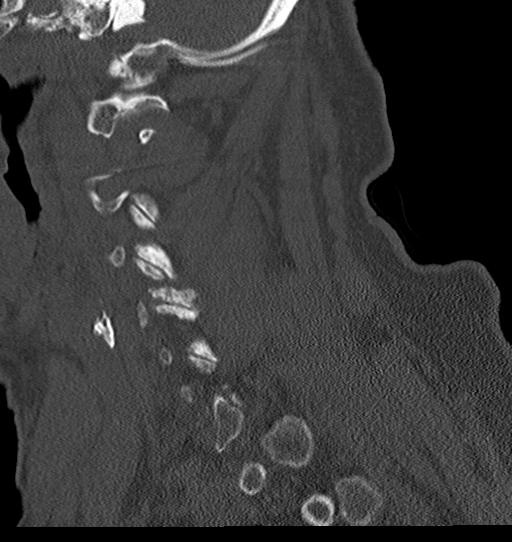
[im 26/61  bone]
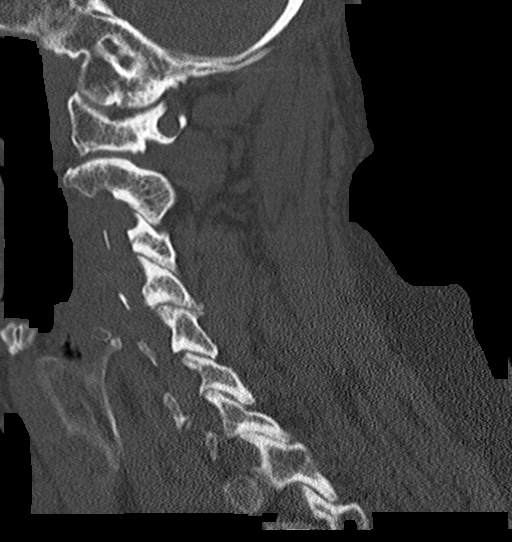
[im 31/61  soft-tissue]
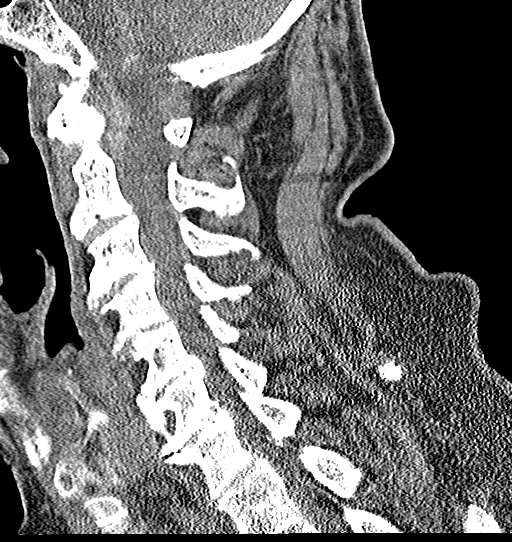
[im 31/61  bone]
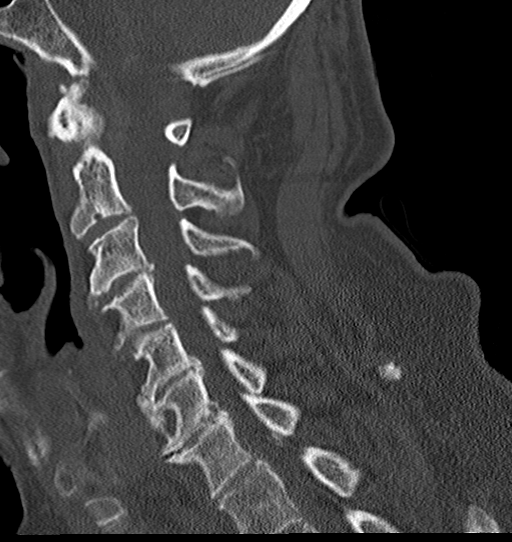
[im 36/61  bone]
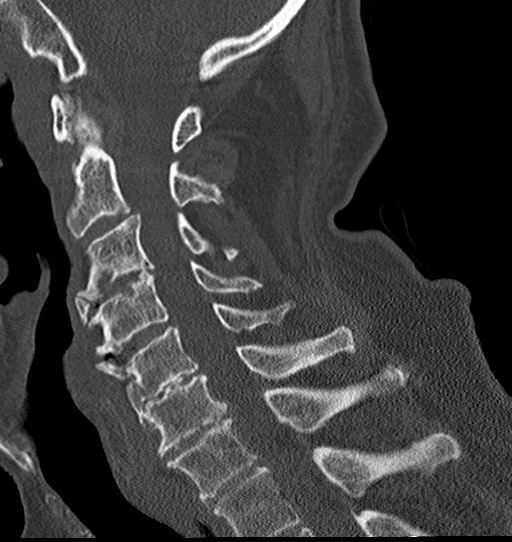
[im 41/61  bone]
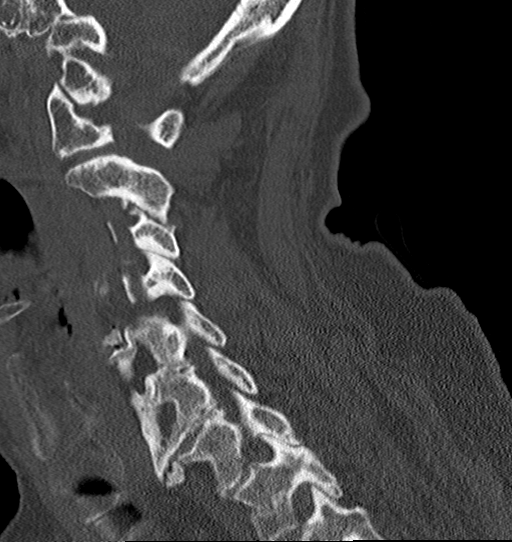

[Series 6: cor bone · coronal · 0.22mm/px · 3 of 61 slices shown]
[im 14/61  bone]
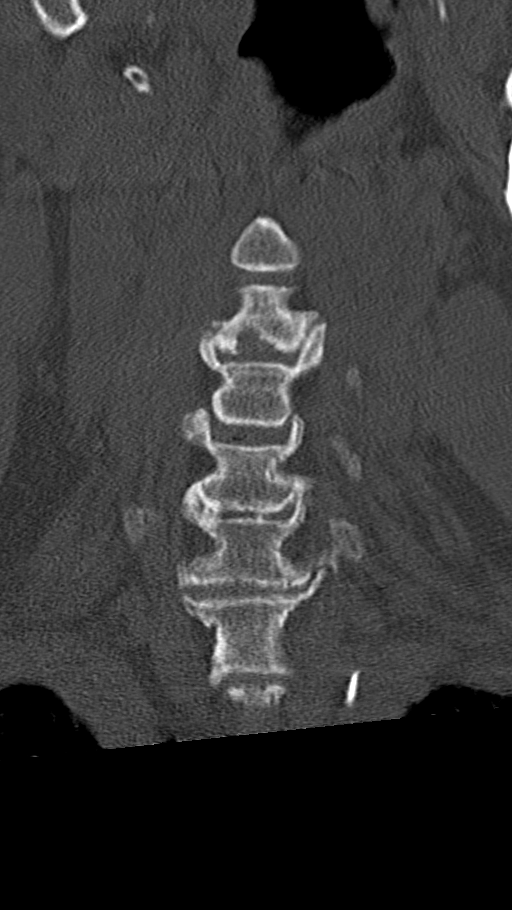
[im 25/61  bone]
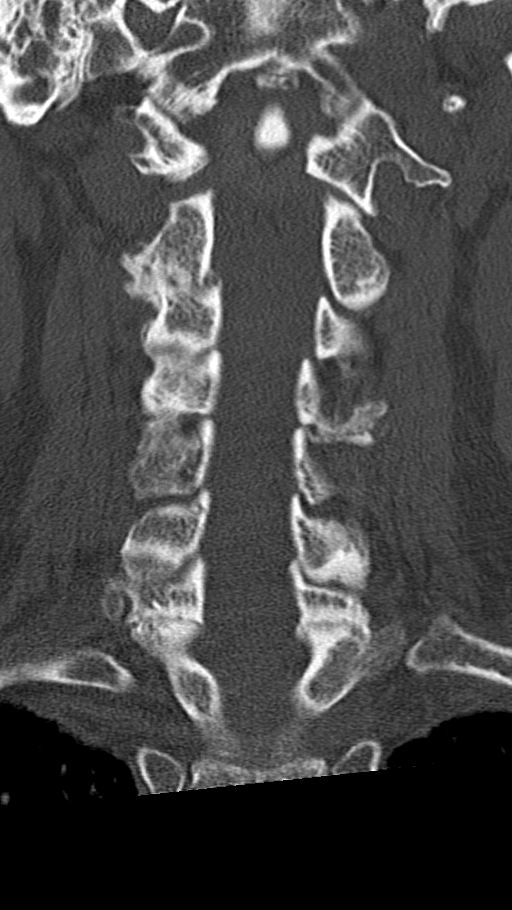
[im 36/61  bone]
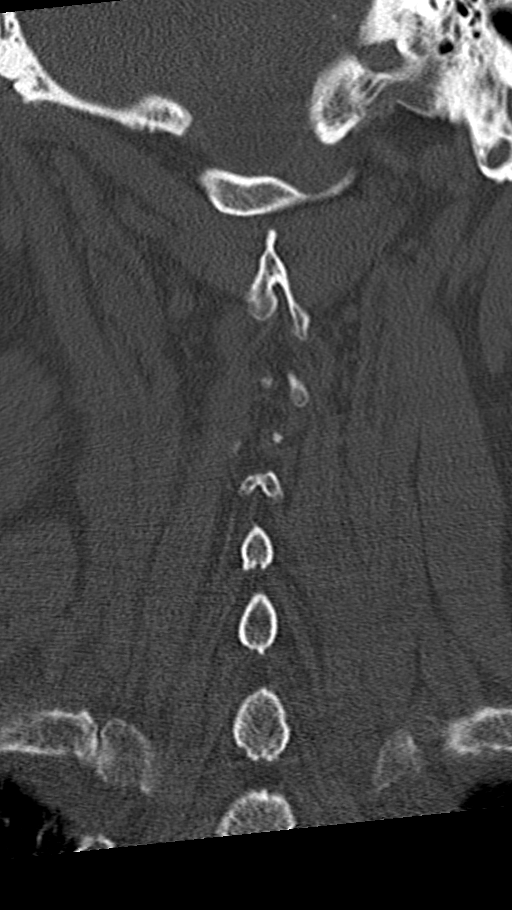

[Series 7: orthogonal axials · axial · 0.21mm/px · z∈[+1575,+1679]mm · 5 of 84 slices shown, 7 images]
[im 14/84  soft-tissue]
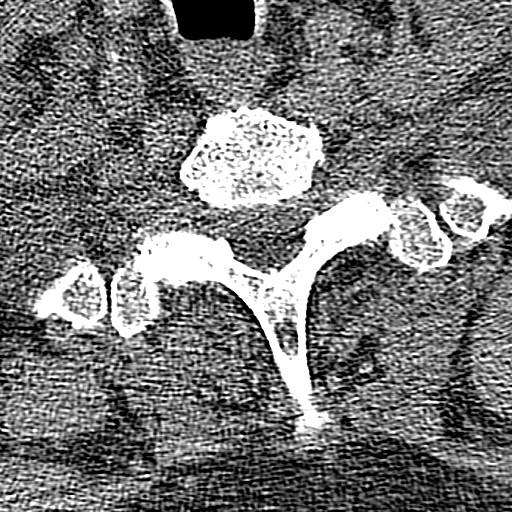
[im 14/84  bone]
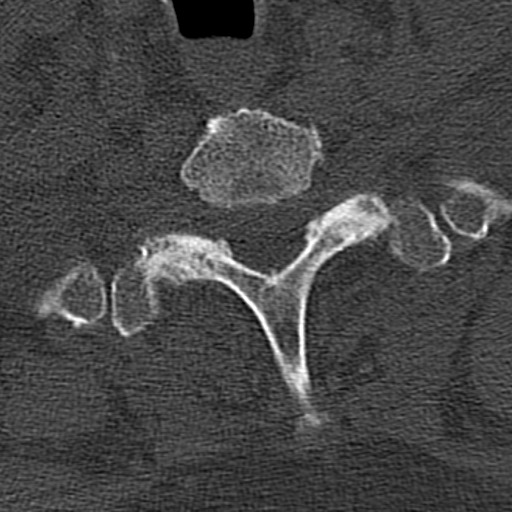
[im 28/84  bone]
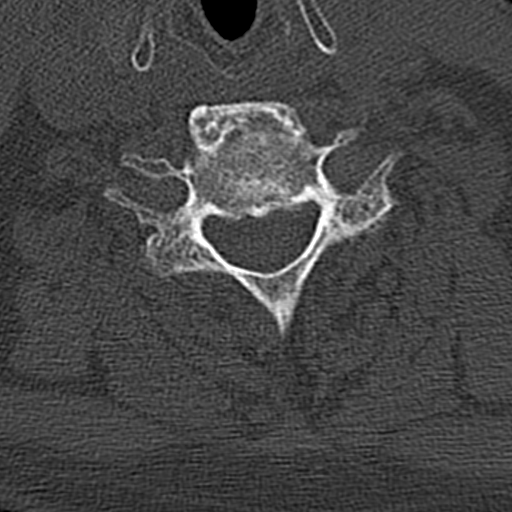
[im 42/84  bone]
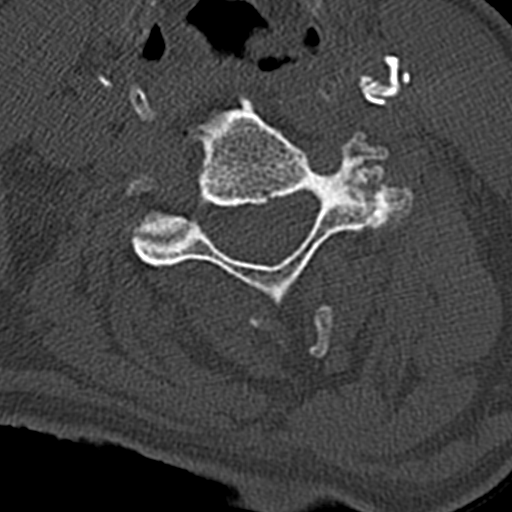
[im 56/84  bone]
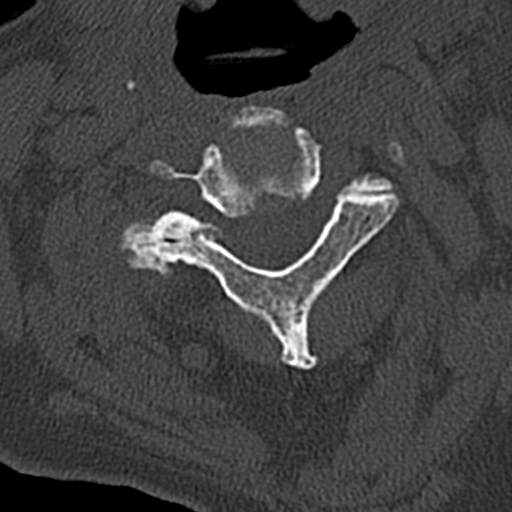
[im 70/84  soft-tissue]
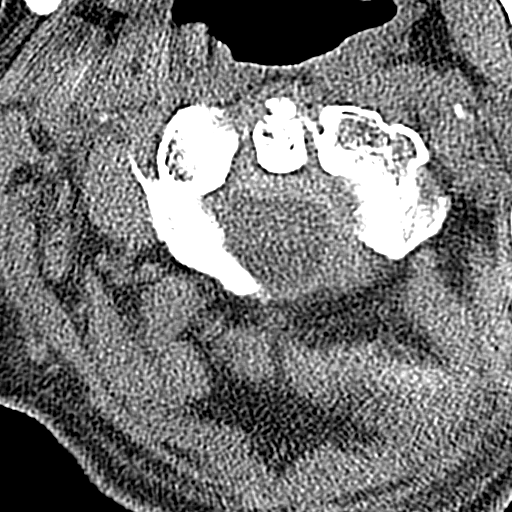
[im 70/84  bone]
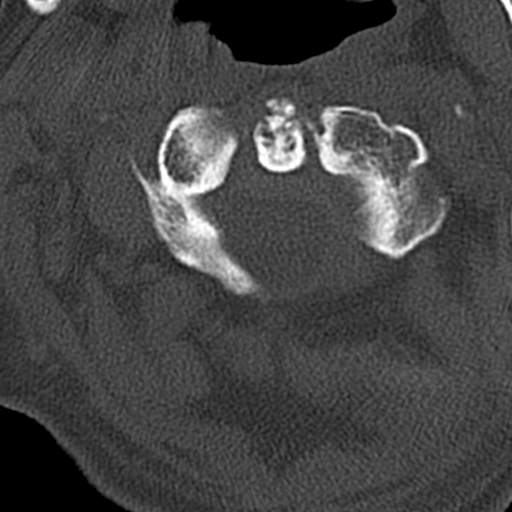

[13 of 35 positions shown; findings below may reference images not displayed]

FINDINGS: CT HEAD FINDINGS

Brain: No evidence of acute infarction, hemorrhage, hydrocephalus,
extra-axial collection or mass lesion/mass effect. Chronic large
volume left middle cerebral artery territory infarct primarily
involving the left temporal lobe, but also involving the left
parietal lobe is noted. There is moderate cerebral volume loss with
associated ex vacuo dilatation. Periventricular white matter
hypoattenuation likely represents chronic small vessel ischemic
disease.

Vascular: There are vascular calcifications in the carotid siphons.

Skull: Normal. Negative for fracture or focal lesion.

Other: None.

CT MAXILLOFACIAL FINDINGS

Osseous: No fracture or mandibular dislocation. No destructive
process.

Orbits: Negative. No traumatic or inflammatory finding.

Sinuses: There is opacification of the right mastoid air cells,
unchanged. A small left mastoid effusion is also unchanged. Right
mastoid mucous retention cyst versus sinus disease is noted.

Soft tissues: There is soft tissue swelling of the right forehead
and scalp.

CT CERVICAL SPINE FINDINGS

Alignment: Normal.

Skull base and vertebrae: No acute fracture. No primary bone lesion
or focal pathologic process.

Soft tissues and spinal canal: No prevertebral fluid or swelling. No
visible canal hematoma.

Disc levels: Up to severe multilevel degenerative disc and joint
disease.

Upper chest: Negative.

Other: None.
IMPRESSION: 1. No acute intracranial process.
2. No acute osseous injury in the cervical spine.
3. No acute facial bone fracture.
